# Patient Record
Sex: Male | Born: 1940 | Race: White | Hispanic: No | Marital: Married | Smoking: Former smoker
Health system: Southern US, Community
[De-identification: ages and names within clinical notes are randomized; demographics above are authoritative.]

## PROBLEM LIST (undated history)

## (undated) DIAGNOSIS — I10 Essential (primary) hypertension: Secondary | ICD-10-CM

## (undated) DIAGNOSIS — K219 Gastro-esophageal reflux disease without esophagitis: Secondary | ICD-10-CM

## (undated) DIAGNOSIS — M2550 Pain in unspecified joint: Secondary | ICD-10-CM

## (undated) DIAGNOSIS — M2540 Effusion, unspecified joint: Secondary | ICD-10-CM

## (undated) DIAGNOSIS — J189 Pneumonia, unspecified organism: Secondary | ICD-10-CM

## (undated) DIAGNOSIS — Z86718 Personal history of other venous thrombosis and embolism: Secondary | ICD-10-CM

## (undated) DIAGNOSIS — E039 Hypothyroidism, unspecified: Secondary | ICD-10-CM

## (undated) DIAGNOSIS — E785 Hyperlipidemia, unspecified: Secondary | ICD-10-CM

## (undated) DIAGNOSIS — I452 Bifascicular block: Secondary | ICD-10-CM

## (undated) DIAGNOSIS — M1990 Unspecified osteoarthritis, unspecified site: Secondary | ICD-10-CM

## (undated) DIAGNOSIS — I2510 Atherosclerotic heart disease of native coronary artery without angina pectoris: Secondary | ICD-10-CM

## (undated) DIAGNOSIS — Z87442 Personal history of urinary calculi: Secondary | ICD-10-CM

## (undated) HISTORY — DX: Atherosclerotic heart disease of native coronary artery without angina pectoris: I25.10

## (undated) HISTORY — DX: Essential (primary) hypertension: I10

## (undated) HISTORY — PX: HEMORROIDECTOMY: SUR656

## (undated) HISTORY — DX: Hyperlipidemia, unspecified: E78.5

## (undated) HISTORY — PX: CARDIAC CATHETERIZATION: SHX172

---

## 1979-08-13 HISTORY — PX: HERNIA REPAIR: SHX51

## 2000-06-17 ENCOUNTER — Emergency Department (HOSPITAL_COMMUNITY): Admission: EM | Admit: 2000-06-17 | Discharge: 2000-06-17 | Payer: Self-pay | Admitting: Emergency Medicine

## 2000-11-27 ENCOUNTER — Ambulatory Visit (HOSPITAL_COMMUNITY): Admission: RE | Admit: 2000-11-27 | Discharge: 2000-11-27 | Payer: Self-pay

## 2003-07-31 ENCOUNTER — Emergency Department (HOSPITAL_COMMUNITY): Admission: EM | Admit: 2003-07-31 | Discharge: 2003-07-31 | Payer: Self-pay

## 2003-09-15 ENCOUNTER — Ambulatory Visit (HOSPITAL_COMMUNITY): Admission: RE | Admit: 2003-09-15 | Discharge: 2003-09-16 | Payer: Self-pay | Admitting: Cardiovascular Disease

## 2003-09-15 ENCOUNTER — Encounter: Payer: Self-pay | Admitting: Cardiovascular Disease

## 2004-01-14 ENCOUNTER — Ambulatory Visit (HOSPITAL_COMMUNITY): Admission: RE | Admit: 2004-01-14 | Discharge: 2004-01-14 | Payer: Self-pay | Admitting: Cardiovascular Disease

## 2006-12-12 DIAGNOSIS — Z86718 Personal history of other venous thrombosis and embolism: Secondary | ICD-10-CM

## 2006-12-12 HISTORY — DX: Personal history of other venous thrombosis and embolism: Z86.718

## 2007-05-16 ENCOUNTER — Inpatient Hospital Stay (HOSPITAL_COMMUNITY): Admission: EM | Admit: 2007-05-16 | Discharge: 2007-05-21 | Payer: Self-pay | Admitting: Emergency Medicine

## 2007-05-21 HISTORY — PX: CORONARY ANGIOPLASTY: SHX604

## 2010-11-08 ENCOUNTER — Ambulatory Visit: Payer: Self-pay | Admitting: Cardiovascular Disease

## 2011-04-26 ENCOUNTER — Telehealth: Payer: Self-pay | Admitting: *Deleted

## 2011-04-26 NOTE — H&P (Signed)
NAMEBRAYTEN, KOMAR NO.:  [id number]   MEDICAL RECORD NO.:  [id number]          PATIENT TYPE:  INP   LOCATION:  2039                         FACILITY:  [facility]   PHYSICIAN:  Vesta Mixer, M.D. DATE OF BIRTH:  02/26/1941   DATE OF ADMISSION:  05/16/2007  DATE OF DISCHARGE:                              HISTORY & PHYSICAL   HISTORY OF PRESENT ILLNESS:  Thomas [name] is a 70 year old gentleman  with a history of coronary artery disease, cigarette smoking and  hyperlipidemia.  He is admitted to the hospital with severe substernal  chest pain and dyspnea.   The patient has a history of coronary artery disease.  He was admitted  with chest pain in 2004.  He was found to have a 99% proximal  LAD  stenosis.  He underwent PTCA and stenting of his proximal LAD using a  3.0 mm Taxus post dilated up to 3.25 mm in diameter.  He has been fairly  stable and in fact has not been back to see me for the past 3 years.   He has been followed by Dr. Jeannetta Nap and has overall been doing fairly  well.  He admits to coming off his medications; in fact, he has not been  taking aspirin in quite some time.   This morning he was out clearing brush and trees.  He had severe  substernal chest pain.  It radiated to the left arm and caused some  left arm tingling and numbness.  It was associated with diaphoresis and  dyspnea.  He called 9-1-1, and EMS gave him some sublingual  nitroglycerin which did not relieve the chest pain.  This chest pain was  finally relieved when he came to the came to the emergency room and  received IV nitroglycerin.  He now is pain free and is admitted for  evaluation.  The patient denies any recent episode similar to this.  He  remains fairly active.   CURRENT MEDICATIONS:  1. Pravachol 40 mg a day.  2. Synthroid 0.15 mg a day.  3. Maxzide once a day.   ALLERGIES:  None.   PAST MEDICAL HISTORY:  1. History of PTCA and stenting of his proximal LAD.  2.  Hypercholesterolemia.  3. Hypothyroidism.  4. Hypertension.   SOCIAL HISTORY:  The patient smokes about a half a pack of cigarettes a  day.  He does not drink alcohol.   FAMILY HISTORY:  His mother died of a heart valve surgery.  His father  lived to be an old age.   REVIEW OF SYSTEMS:  Reviewed and is essentially negative.   PHYSICAL EXAMINATION:  GENERAL:  He is a elderly gentleman in no acute  distress.  He is alert and oriented x3.  His mood and affect are normal.  He is currently pain free.  VITAL SIGNS:  His blood pressure is 108/68 with a heart rate 66.  HEENT:  Carotids with no bruits.  There is no JVD, no thyromegaly.  LUNGS:  Clear.  HEART:  Regular rate.  S1-S2.  ABDOMEN:  Good bowel sounds and is nontender.  EXTREMITIES:  No clubbing, cyanosis or edema.  NEUROLOGIC:  Nonfocal.  His pulses are intact.   ELECTROCARDIOGRAM:  Normal sinus rhythm.  He has a right bundle branch  block.   LABORATORY DATA:  His first set of cardiac enzymes is negative.  His  sodium is 138, potassium is 2.7, chlorides 104, CO2 is 15, BUN is 15,  creatinine is 1.3.  White blood cell count is 10.5, hemoglobin 16,  hematocrit 46%, platelet count is 257.   ASSESSMENT/PLAN:  Chest pain.  The patient presents with chest pain that  is very concerning for unstable angina.  He states that this feels  fairly similar to his previous episodes of angina.  He is now pain free  on nitroglycerin.  We will add heparin and Integrilin.  We will  anticipate doing a heart catheterization tomorrow.  We have discussed  the risks, benefits and options including bleeding or bruising of the  leg, myocardial infarction, stroke, the need for emergency surgery,  allergy, infection, kidney problems and death.  He understands and  agrees to proceed.  We will add aspirin and Lipitor.           ______________________________  Vesta Mixer, M.D.     PJN/MEDQ  D:  05/16/2007  T:  05/17/2007  Job:  [id number]   cc:    Windle Guard, M.D.

## 2011-04-26 NOTE — Telephone Encounter (Signed)
Received [facility] update stating pt not taking simvistatin, tried to reach pt to see what we could do, number disconnectedJodette [name]/[facility] .

## 2011-04-26 NOTE — Cardiovascular Report (Signed)
Thomas [name], HULL NO.:  [id number]   MEDICAL RECORD NO.:  [id number]          PATIENT TYPE:  INP   LOCATION:  2039                         FACILITY:  [facility]   PHYSICIAN:  Vesta Mixer, M.D. DATE OF BIRTH:  11/24/1941   DATE OF PROCEDURE:  05/21/2007  DATE OF DISCHARGE:                            CARDIAC CATHETERIZATION   Ambers Iyengar is a 70 year old gentleman with a history of coronary artery  disease.  He had a long history of hyperlipidemia and cigarette smoking.  He had an LAD stent placed several years ago.  He presented to the  hospital with episodes of chest pain last week.  Cardiac catheterization  at that time revealed a large stent in the origin of the left circumflex  artery.  He was treated with Integrilin and heparin, Plavix and aspirin  for the next 4 days, and we brought back today for relook.   PROCEDURES:  Limited left heart catheterization with coronary  angiography.   The right femoral artery was easily cannulated using modified Seldinger  technique.   LV pressure was 131/78.   Angiography, left main.  The left main has minor luminal irregularities.   The left anterior descending artery is a large vessel.  The stent is  located in the proximal aspect of the LAD and actually overlaps the  circumflex artery to a slight degree.   The stent is widely patent.  The mid and distal LAD have no significant  irregularities.   Left circumflex artery is a moderate-sized vessel.  There is a thrombus  in the origin of the left circumflex artery, but the thrombus appears to  be significantly smaller than at the previous catheterization.  He has  TIMI grade 3 flow down the circumflex artery.   CONCLUSION:  1. Significant coronary artery disease involving the left circumflex      artery.  He has a thrombus in the origin of the left circumflex      artery that appears to be successfully resolving on medical      therapy.  We will anticipate that  he goes home today on Plavix and      aspirin.  He has been instructed to come back if he has any further      episodes of chest pain.  I worry that evaluating this further with      an IVUS would quite possibly dislodge the thrombus.  We will plan      on doing a stress Cardiolite study in approximately 6 months.           ______________________________  Vesta Mixer, M.D.     PJN/MEDQ  D:  05/21/2007  T:  05/21/2007  Job:  [id number]   cc:   Windle Guard, M.D.

## 2011-04-26 NOTE — Discharge Summary (Signed)
Thomas [name], Thomas [name]              ACCOUNT NO.:  [id number]   MEDICAL RECORD NO.:  [id number]          PATIENT TYPE:  INP   LOCATION:  2039                         FACILITY:  [facility]   PHYSICIAN:  Vesta Mixer, M.D. DATE OF BIRTH:  07-Nov-1941   DATE OF ADMISSION:  05/16/2007  DATE OF DISCHARGE:  05/21/2007                               DISCHARGE SUMMARY   DISCHARGE DIAGNOSES:  1. History of coronary artery disease with a recent development of a      thrombus in the origin of the left circumflex artery.  2. History of PTCA and stenting of the proximal left anterior      descending artery.  3. Dyslipidemia.  4. Hypertension.  5. Hypothyroidism  6. Cigarette smoking.   DISCHARGE MEDICATIONS:  1. Enteric-coated aspirin 325 mg a day.  2. Plavix 75 mg a day.  3. Lipitor 80 mg a day.  4. Maxzide 37.5 mg/25 mg once a day.  5. Synthroid 0.15 mg a day.  6. Nitroglycerin 0.4 mg sublingually as needed.   DISPOSITION:  The patient is to eat a low sodium and a heart healthy  diet.  He is to see Dr. Elease Hashimoto on Friday this week.  We will make a  decision regarding his return to work at that point.   HISTORY:  Thomas [name] is a 70 year old gentleman who is status post PTCA  and stenting of his proximal left anterior descending artery  approximately 4 years ago.  He was admitted to hospital with recurrent  episodes of chest pain was found to have positive cardiac enzymes.   HOSPITAL COURSE PER PROBLEMS:  1. Coronary artery disease.  The patient underwent heart      catheterization the following morning.  He was found to have his      left anterior descending artery stent was widely patent.  The      proximal edge of the LAD stent extends out into the circumflex      artery.  This stent strut at the origin of the circumflex artery is      a nidus of a large thrombus that extended approximately 3-4 mm down      into the circumflex vessel.  The thrombus appeared to be globular      3x3 or  perhaps 4x4 mm thrombus.  There was TIMI grade 3 flow down      the circumflex artery and also there was brisk flow down the left      anterior descending artery.  The right coronary artery had only      minor luminal irregularities.  He was kept on heparin and      Integrilin for the next 4 days.  He was also started on Plavix and      aspirin.  It turns out that the patient had stopped his Plavix and      aspirin several years ago.  We brought him back for a repeat heart      catheterization on May 21, 2007, and the thrombus had greatly      diminished in size.  He still  had some evidence of a filling      defect, but the thrombus was clearly smaller and he had brisk flow      down this vessel.  He is now discharged in satisfactory condition.      Will continue him on aspirin and Plavix.  I do not think that he      will need any further IV 2b/3a inhibitors.  My thought is that if      we were to try to angioplasty the ostium of the left circumflex      artery that we would probably dislodge the stent.  Our better      option is to try to dissolve the stent where it stands.  He has      been instructed to return to the hospital if he starts having any      significant episodes of chest pain.  2. Hyperlipidemia.  The patient had come off all of his cholesterol      medicines.  We will start him on Lipitor 80 mg a day.  3. Hypertension.  The patient will be continued on his Maxzide.  4. Hypothyroidism.  The patient will continue on his Synthroid.  He      will follow up with Dr. Jeannetta Nap for this.   REVIEW OF SYSTEMS:           ______________________________  Vesta Mixer, M.D.     PJN/MEDQ  D:  05/21/2007  T:  05/22/2007  Job:  [id number]   cc:   Windle Guard, M.D.

## 2011-04-26 NOTE — Cardiovascular Report (Signed)
NAMEATA, PECHA NO.:  [id number]   MEDICAL RECORD NO.:  [id number]          PATIENT TYPE:  INP   LOCATION:  2039                         FACILITY:  [facility]   PHYSICIAN:  Vesta Mixer, M.D. DATE OF BIRTH:  12-25-1940   DATE OF PROCEDURE:  05/17/2007  DATE OF DISCHARGE:                            CARDIAC CATHETERIZATION   Thomas [name] is a 70 year old gentleman with a history of coronary artery  disease.  Status post PTCA and stenting of his proximal left anterior  descending artery 4 years ago.  He stopped taking his aspirin some time  this past year.  He presented yesterday with symptoms of chest pain that  started yesterday morning.  He had positive troponin levels overnight.  He is referred for heart catheterization for further evaluation.   PROCEDURES:  The procedure was left heart catheterization with coronary  angiography.   The right femoral artery was easily cannulated using a modified  Seldinger technique.   HEMODYNAMIC RESULTS:  LV pressure is 131/13 with an aortic pressure of  137/63.   Angiography, left main:  The left main has minor luminal irregularities.   The left anterior descending artery has mild irregularities.  The  proximal LAD stent has no evidence of restenosis.  The proximal edge of  the stent comes right up to the left circumflex artery and may in fact  overlap the origin of the circumflex artery.  There are minor luminal  irregularities in the mid and distal LAD.   Left circumflex artery has a filling defect at the origin which appears  to be a thrombus.  It appears to be right at the origin of the  circumflex were the proximal LAD stent is located.  There are no other  significant irregularities in the left circumflex artery.   Right coronary artery is large and dominant.  It has a very tortuous  takeoff.  The distal right coronary artery has minor luminal  irregularities.  The posterior descending artery is unremarkable  and the  moderate-sized posterolateral branch is normal.   Left ventriculogram was performed in the 30 RAO position.  It reveals  overall normal left ventricular systolic function.  There is some  suggestion of hypokinesis of the lateral wall.   IMPRESSION:  Coronary artery disease involving the ostium of the left  circumflex artery.  This appears to be a thrombus and may be associated  with a proximal edge of the LAD stent.  We will   PLAN:  We will treat him with heparin and Integrilin through the  weekend.  We will add Plavix and continue his aspirin.  We will restudy  him next week.  He appears to be fairly stable.   The patient did have some chest pain following the procedure, but his  EKG was unremarkable.           ______________________________  Vesta Mixer, M.D.     PJN/MEDQ  D:  05/17/2007  T:  05/17/2007  Job:  [id number]

## 2011-04-28 ENCOUNTER — Other Ambulatory Visit: Payer: Self-pay | Admitting: Cardiovascular Disease

## 2011-04-29 NOTE — Op Note (Signed)
[facility]  Patient:    Thomas [name], Thomas [name]                     MRN: [id number] Proc. Date: 11/27/00 Adm. Date:  [id number] Attending:  Meredith Leeds                           Operative Report  PREOPERATIVE DIAGNOSES:  Indirect right inguinal hernia.  POSTOPERATIVE DIAGNOSES:  Indirect right inguinal hernia.  OPERATION PERFORMED:  Repair of right inguinal hernia.  SURGEON:  Dr. Orson Slick.  ANESTHESIA:  General supplemented by local.  DESCRIPTION OF PROCEDURE:  After the patient was adequately monitored and anesthetized and after routine preparation and draping of the right inguinal region, I made a slightly oblique incision beginning just above and lateral to the pubic tubercle and dissected down through the fat until I encountered the aponeurosis of the external oblique and then identified the external ring and opened the fibers into the external ring. I mobilized the spermatic cord taking care to avoid injury to the ilioinguinal nerve. I saw no evidence of a direct hernia. I made a longitudinal cut on the proximal part of the cord and identified the large indirect sac. I dissected it free up to the internal ring and accidentally made a hole in it so I suture ligated it below the hole and amputated the excess. I then reduced the hernia into the internal ring and put a small plug of polypropylene mesh in the superior part of the internal ring held in place with a single suture of 2-0 silk. I fashioned a patch of mesh to fit the inguinal floor leaving a slit, will exit at the spermatic cord and sewed it in from the pubic tubercle superiorly and medially with a basting stitch along the internal oblique fascia and laterally and inferiorly with a running stitch along the inguinal ligament using 2-0 Prolene. I joined the tails of the mesh together lateral to the cord with a single stitch of 2-0 Prolene. I anesthetized the operative area with long  acting local anesthetic. Sponge, needle and instrument counts were correct. I closed the external oblique with running 3-0 Vicryl, closed the Scarpas fascia with running 3-0 Vicryl, closed with skin with interrupted intracuticular 4-0 Vicryl and Steri-Strips. The patient tolerated the procedure well. DD:  11/27/00 TD:  11/28/00 Job: [id number]

## 2011-04-29 NOTE — H&P (Signed)
NAME:  Thomas [name], Thomas [name]                       ACCOUNT NO.:  [id number]   MEDICAL RECORD NO.:  [id number]                   PATIENT TYPE:  OIB   LOCATION:                                       FACILITY:  [facility]   PHYSICIAN:  Vesta Mixer, M.D.              DATE OF BIRTH:  [birth year]   DATE OF ADMISSION:  09/15/2003  DATE OF DISCHARGE:                                HISTORY & PHYSICAL   REASON FOR ADMISSION:  Thomas [name] is a middle-aged gentleman with a history  of unstable angina.  Thomas [name] is admitted now for heart catheterization to further  evaluate his angina.   HISTORY OF PRESENT ILLNESS:  Thomas [name] is a 70 year old gentleman we are  asked to see for further evaluation of chest pain.  Thomas [name] has been having these  episodes of chest pain for the past several months.  Over the past several  weeks, they have been getting progressively worse.  Thomas [name] states that these  episodes of chest pain occur every time Thomas [name] walks.  They are described as a  chest pressure-like sensation with radiation up into his neck and to his  left jaw and shoulder.  The pain typically lasts for several minutes and  resolves after rest.  Thomas [name] has never had any episodes of chest pain at rest.  They are not associated with eating, drinking, change in position, or taking  a deep breath.  Thomas [name] has not had any episodes of PND, orthopnea, syncope, or  presyncope.  They are also not associated with any diaphoresis.   CURRENT MEDICATIONS:  1. Levothroid once a day.  2. Aspirin 81 mg a day.   ALLERGIES:  No known drug allergies.   PAST MEDICAL HISTORY:  1. Hypertension.  2. Hypothyroidism.  3. Hypercholesterolemia.   SOCIAL HISTORY:  The patient works for [employer].  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  Thomas [name] smoked one-  half pack of cigarettes a day for 50 years but quit approximately 10 months  ago.  Thomas [name] does not get any regular exercise.   FAMILY HISTORY:  His father is  still alive and is 42 years old.  Thomas [name] has an  irregular heart beat and also has hypertension.  His mother died at age 57  during heart valve surgery.  She also had a history of coronary artery  disease.   REVIEW OF SYSTEMS:  Was reviewed and was essentially negative.  Thomas [name] denies  any weight gain, weight loss, heat or cold intolerance, or problems with his  eyes, ears, nose, or throat.  Thomas [name] denies any sputum production.  Thomas [name] denies  any GI or GU problems.  Thomas [name] denies any rash or skin nodules.  His Review of  Systems was reviewed and is otherwise negative.   PHYSICAL EXAMINATION:  GENERAL:  Thomas [name] is a middle-aged gentleman in no acute  distress.  Thomas [name] is alert and oriented x 3, and his mood and affect are normal.  VITAL SIGNS:  Weight 212 pounds.  Blood pressure 130.86 with a heart rate of  60.  NECK:  Exam reveals 2+ carotids.  Thomas [name] has no bruits.  There is no JVD, no  thyromegaly.  LUNGS:  Clear to auscultation.  HEART:  Regular rate, S1, S2 with no murmurs, gallops, or rubs.  ABDOMEN:  Good bowel sounds and nontender.  EXTREMITIES:  No clubbing, cyanosis, or edema.  NEUROLOGIC:  Exam is nonfocal.   LABORATORY DATA:  His EKG reveals normal sinus rhythm.  Thomas [name] has a right  bundle branch block with associated ST and T wave abnormalities.   ASSESSMENT:  Thomas [name] presents with episodes of classic unstable angina.  Thomas [name] has chest pain every time Thomas [name] exercises, and the chest pains resolve with  rest.  Thomas [name] has a right bundle branch block but otherwise does not have any  acute changes on his EKG.   His recent cholesterol numbers revealed an LDL of 150 with an HDL of 30.  Thomas [name]  also has a history of cigarette smoking until approximately 10 months ago.  Given these symptoms and this history, I have recommended that we proceed  with heart catheterization.  We have discussed the risks, benefits, and  options of heart catheterization.  Thomas [name] understands and agrees to proceed.  We  will plan on performing  catheterization early next week.                                                Vesta Mixer, M.D.    PJN/MEDQ  D:  09/09/2003  T:  09/09/2003  Job:  [id number]   cc:   Windle Guard, M.D.  [address]  [place], [place] [address]  Fax: [phone]

## 2011-04-29 NOTE — Discharge Summary (Signed)
   Thomas [name], Thomas [name]                        ACCOUNT NO.:  [id number]   MEDICAL RECORD NO.:  [id number]                   PATIENT TYPE:  OIB   LOCATION:  6523                                 FACILITY:  [facility]   PHYSICIAN:  Vesta Mixer, M.D.              DATE OF BIRTH:  04-16-1941   DATE OF ADMISSION:  09/15/2003  DATE OF DISCHARGE:  09/16/2003                                 DISCHARGE SUMMARY   DISCHARGE DIAGNOSES:  1. Coronary artery disease, status post percutaneous transluminal coronary     angioplasty and stenting of his proximal left anterior descending.  2. Dyslipidemia.  3. Hypertension.   DISCHARGE MEDICATIONS:  1. Enteric coated aspirin 325 mg p.o. daily.  2. Plavix 75 mg p.o. daily.  3. Levoxyl 0.1 mg daily.  4. Maxzide once daily.  5. Nitroglycerin 0.4 mg sublingual as needed.   HISTORY OF PRESENT ILLNESS:  The patient is a 70 year old gentleman who  recently presented to the office with symptoms of unstable angina.  He was  admitted for heart catheterization for further evaluation.   HOSPITAL COURSE:  PROBLEM #1:  CORONARY ARTERY DISEASE:  The patient had a  diagnostic heart catheterization which revealed a tight 99% stenosis in the  proximal aspect of his left anterior descending.  The stenosis extended all  the way to the left main.  He underwent successful percutaneous transluminal  coronary angioplasty and stenting of this left anterior descending at the  ostium and proximal vessel.  He tolerated the procedure quite well and did  not have any significant electrocardiogram changes.  He will be discharged  on the above-noted medications and disposition.  He will follow up with Dr.  Elease Hashimoto in one week.  He will follow up with Dr.  Jeannetta Nap as needed.   All of the patient's other medical problems were stable.                                                Vesta Mixer, M.D.    PJN/MEDQ  D:  09/16/2003  T:  09/16/2003  Job:  [id number]   cc:   Windle Guard, M.D.  [address]  [place], [place] [address]  Fax: [phone]

## 2011-04-29 NOTE — Cardiovascular Report (Signed)
NAME:  Thomas [name], Thomas [name]                        ACCOUNT NO.:  [id number]   MEDICAL RECORD NO.:  [id number]                   PATIENT TYPE:  OIB   LOCATION:  6523                                 FACILITY:  [facility]   PHYSICIAN:  Vesta Mixer, M.D.              DATE OF BIRTH:  23-Jun-1941   DATE OF PROCEDURE:  09/15/2003  DATE OF DISCHARGE:                              CARDIAC CATHETERIZATION   INDICATIONS:  Mr. Hegner is a 70 year old gentleman with a history of  hypertension, hypercholesterolemia, and hypothyroidism.  He presented to the  office the other day with new onset unstable angina.  He described chest  pain with any sort of exertion.  The chest pain was quite reliable.  We  proceeded with heart catheterization based on his clinical presentation.   PROCEDURE:  Left heart catheterization with coronary angiography with PTCA  and stenting of the proximal left anterior descending artery.   The right femoral artery was easily cannulated using a modified Seldinger  technique.   HEMODYNAMICS:  The LV pressure was 141/16 with an aortic pressure of 141/71.   ANGIOGRAPHY:  The left main coronary artery had mild disease in the proximal  segment.  The distal segment had a 20-30% stenosis.   The left anterior descending artery had diffuse 30-40% stenosis along the  proximal 8 mm and then had a tight eccentric plaque rupture with about a 99%  stenosis.  Following this there was some post stenotic dilatation.  There  were minor luminal irregularities in the mid and distal LAD.   The left circumflex artery is a moderate to large vessel.  There is mild  disease in the circumflex artery.   The right coronary artery is relatively small, but is dominant.  It gives  off a small, but normal posterolateral artery and posterior descending  artery.   LEFT VENTRICULOGRAM:  The left ventriculogram was performed in a 30 RAO  position.  It reveals overall normal left ventricular systolic  function.  There was no mitral regurgitation.   PTCA AND STENTING:  The 6-French system was traded out for a 7-French  system.  The patient was given a double bolus Integrilin drip followed by a  weight based heparin protocol.   The left main was engaged using a Judkins left 4 guide.  A Patriot guidewire  was placed down into the distal left anterior descending artery.  A 2.5 x 15  mm Maverick balloon was positioned across the stenosis and was inflated up  to 8 atmospheres for 31 seconds.  Following this a 3.0 x 16 mm Taxus stent  was positioned right at the ostium of the left anterior descending artery.  It was inflated up to 14 atmospheres for 19 seconds.  Angiography during the  balloon inflation revealed that there was no flow down the left circumflex  artery or the LAD.  They were occluded for approximately 10 seconds.  The  patient was hemodynamically stable during this inflation.  The balloon was  deflated.  Follow-up angiography revealed a very nice angiographic result.  Post stent dilatation was achieved using a 3.25 x 12 mm Quantum Maverick.  It was inflated up to 18 atmospheres for 31 seconds followed by 12  atmospheres for 7 seconds in the more proximal aspect of the stent.  Finally, it was inflated in the middle of the stent up to 20 atmospheres for  15 seconds.  This resulted in a very nice angiographic result.  He has no  significant residual stenosis in the proximal aspect of the vessel.  There  is some irregularity at the level of the post stent dilatation.  The final  balloon was inflated up to 20 atmospheres and achieved maximal size at that  time.  He does not appear to have any other significant problems with this  lesion.   COMPLICATIONS:  None.   CONCLUSION:  Successful PTCA and stenting of the left anterior descending  artery.  He had no complications despite the proximal location of this  lesion and the brief occlusion of both the left circumflex artery and  the  left anterior descending artery during balloon inflation.                                               Vesta Mixer, M.D.    PJN/MEDQ  D:  09/15/2003  T:  09/15/2003  Job:  [id number]   cc:   Windle Guard, M.D.  [address]  [place], [place] [address]  Fax: [phone]

## 2011-05-13 ENCOUNTER — Encounter: Payer: Self-pay | Admitting: Cardiovascular Disease

## 2011-05-13 ENCOUNTER — Other Ambulatory Visit: Payer: Self-pay | Admitting: *Deleted

## 2011-05-13 ENCOUNTER — Ambulatory Visit (INDEPENDENT_AMBULATORY_CARE_PROVIDER_SITE_OTHER): Payer: Medicare Other | Admitting: Cardiovascular Disease

## 2011-05-13 VITALS — BP 134/74 | HR 64 | Wt 204.0 lb

## 2011-05-13 DIAGNOSIS — E876 Hypokalemia: Secondary | ICD-10-CM

## 2011-05-13 DIAGNOSIS — I10 Essential (primary) hypertension: Secondary | ICD-10-CM

## 2011-05-13 DIAGNOSIS — E785 Hyperlipidemia, unspecified: Secondary | ICD-10-CM | POA: Insufficient documentation

## 2011-05-13 DIAGNOSIS — I2510 Atherosclerotic heart disease of native coronary artery without angina pectoris: Secondary | ICD-10-CM

## 2011-05-13 LAB — LIPID PANEL
HDL: 37.1 mg/dL — ABNORMAL LOW (ref >39.00)
LDL Cholesterol: 76 mg/dL (ref 0–99)
Total CHOL/HDL Ratio: 4

## 2011-05-13 LAB — BASIC METABOLIC PANEL
CO2: 28 mEq/L (ref 19–32)
Calcium: 9.1 mg/dL (ref 8.4–10.5)
Chloride: 101 mEq/L (ref 96–112)
Creatinine, Ser: 1.4 mg/dL (ref 0.4–1.5)
Glucose, Bld: 104 mg/dL — ABNORMAL HIGH (ref 70–99)

## 2011-05-13 LAB — HEPATIC FUNCTION PANEL
AST: 19 U/L (ref 0–37)
Alkaline Phosphatase: 53 U/L (ref 39–117)
Total Bilirubin: 0.8 mg/dL (ref 0.3–1.2)

## 2011-05-13 MED ORDER — POTASSIUM CHLORIDE ER 10 MEQ PO TBCR: 10.0000 meq | EXTENDED_RELEASE_TABLET | Freq: Every day | ORAL | Status: DC

## 2011-05-13 NOTE — Assessment & Plan Note (Signed)
Thomas [name] remains stable. He has not had any further episodes of chest pain.

## 2011-05-13 NOTE — Progress Notes (Signed)
Thomas [name] Date of Birth  1941-02-12 [facility] [address]     [address] [place], [place]  [address] [phone]  Fax  [phone]  History of Present Illness:  Thomas [name]  is a middle-age gentleman with a history of coronary artery disease. He is status post PTCA and stenting of his ostial left anterior descending artery. He's had thrombus formation on the proximal edge of the stent that extended out over the left complex artery. Since identification of thrombus, he has been on Plavix and should remain on Plavix for the rest of his life.  Current Outpatient Prescriptions on File Prior to Visit  Medication Sig Dispense Refill  . simvastatin (ZOCOR) 40 MG tablet TAKE ONE TABLET BY MOUTH EVERY DAY  30 tablet  1    No Known Allergies  Past Medical History  Diagnosis Date  . Hypertension   . Hyperlipidemia   . Arrhythmia   . Coronary artery disease   . Thyroid disease     Past Surgical History  Procedure Date  . Cardiac catheterization   . Coronary angioplasty 05/21/2007    lad stent/taxus stent    History  Smoking status  . Current Everyday Smoker -- 0.3 packs/day  . Types: Cigarettes  Smokeless tobacco  . Not on file    History  Alcohol Use No    History reviewed. No pertinent family history.  Reviw of Systems:  Reviewed in the HPI.  All other systems are negative.  Physical Exam: BP 134/74  Pulse 64  Wt 204 lb (92.534 kg) The patient is alert and oriented x 3.  The mood and affect are normal.  The skin is warm and dry.  Color is normal.  The HEENT exam reveals that the sclera are nonicteric.  The mucous membranes are moist.  The carotids are 2+ without bruits.  There is no thyromegaly.  There is no JVD.  The lungs are clear.  The chest wall is non tender.  The heart exam reveals a regular rate with a normal S1 and S2.  There are no murmurs, gallops, or rubs.  The PMI is not displaced.   Abdominal exam reveals good  bowel sounds.  There is no guarding or rebound.  There is no hepatosplenomegaly or tenderness.  There are no masses.  Exam of the legs reveal no clubbing, cyanosis, or edema.  The legs are without rashes.  The distal pulses are intact.  Cranial nerves II - XII are intact.  Motor and sensory functions are intact.  The gait is normal.  Assessment / Plan:

## 2011-05-13 NOTE — Assessment & Plan Note (Signed)
>>  ASSESSMENT AND PLAN FOR ESSENTIAL HYPERTENSION WRITTEN ON 05/13/2011  1:30 PM BY NAHSER, PHILIP J, MD  His blood pressure remains well controlled. We'll continue with his same medications.

## 2011-05-13 NOTE — Assessment & Plan Note (Signed)
His blood pressure remains well controlled. We'll continue with his same medications.

## 2011-05-13 NOTE — Telephone Encounter (Signed)
Script called, Pt verbalized understanding. Alfonso Ramus RN

## 2011-06-10 ENCOUNTER — Other Ambulatory Visit (INDEPENDENT_AMBULATORY_CARE_PROVIDER_SITE_OTHER): Payer: Medicare Other | Admitting: *Deleted

## 2011-06-10 DIAGNOSIS — E785 Hyperlipidemia, unspecified: Secondary | ICD-10-CM

## 2011-06-10 DIAGNOSIS — I2510 Atherosclerotic heart disease of native coronary artery without angina pectoris: Secondary | ICD-10-CM

## 2011-06-10 LAB — BASIC METABOLIC PANEL
CO2: 30 mEq/L (ref 19–32)
Calcium: 9.3 mg/dL (ref 8.4–10.5)
Chloride: 101 mEq/L (ref 96–112)
Glucose, Bld: 112 mg/dL — ABNORMAL HIGH (ref 70–99)
Sodium: 139 mEq/L (ref 135–145)

## 2011-06-13 ENCOUNTER — Telehealth: Payer: Self-pay | Admitting: *Deleted

## 2011-06-13 DIAGNOSIS — E876 Hypokalemia: Secondary | ICD-10-CM

## 2011-06-13 NOTE — Telephone Encounter (Signed)
Increased k+ daily to bid and recheck bmet in one month. Pt informed and lab app made.Pt verbalized understanding. Alfonso Ramus RN

## 2011-06-20 ENCOUNTER — Telehealth: Payer: Self-pay | Admitting: *Deleted

## 2011-06-20 NOTE — Telephone Encounter (Signed)
Pt request not to have to report for jury duty and would like a letter to excuse him. Pt states his heart flutters and dont want to sit there. Explained Dr Ian Bushman only excuses for new surgery cases. Said his hips and back hurt bad and its hard for him to walk. I suggested he check with his orthopaedic sugeon for a letter. Informed him i would pose question to dr Ian Bushman next week due to his vacation and get back with him next week. Pt ok with that. Alfonso Ramus RN

## 2011-06-21 NOTE — Telephone Encounter (Signed)
Agree.  I do not think I will be able to excuse him for cardiac reasons.

## 2011-06-24 NOTE — Telephone Encounter (Signed)
Pt informed and papers placed at front desk.

## 2011-07-14 ENCOUNTER — Other Ambulatory Visit (INDEPENDENT_AMBULATORY_CARE_PROVIDER_SITE_OTHER): Payer: Medicare Other | Admitting: *Deleted

## 2011-07-14 DIAGNOSIS — E876 Hypokalemia: Secondary | ICD-10-CM

## 2011-07-14 LAB — BASIC METABOLIC PANEL
BUN: 17 mg/dL (ref 6–23)
CO2: 26 mEq/L (ref 19–32)
Calcium: 9.5 mg/dL (ref 8.4–10.5)
Creatinine, Ser: 1.3 mg/dL (ref 0.4–1.5)
Glucose, Bld: 106 mg/dL — ABNORMAL HIGH (ref 70–99)

## 2011-07-15 ENCOUNTER — Telehealth: Payer: Self-pay | Admitting: *Deleted

## 2011-07-15 MED ORDER — POTASSIUM CHLORIDE ER 10 MEQ PO TBCR: 10.0000 meq | EXTENDED_RELEASE_TABLET | Freq: Two times a day (BID) | ORAL | Status: DC

## 2011-07-15 NOTE — Telephone Encounter (Signed)
Patient called with lab results. Patient request refill. Alfonso Ramus RN

## 2011-08-11 ENCOUNTER — Other Ambulatory Visit: Payer: Self-pay | Admitting: Cardiovascular Disease

## 2011-09-29 LAB — CK TOTAL AND CKMB (NOT AT ARMC)
CK, MB: 8.3 — ABNORMAL HIGH
Total CK: 147
Total CK: 188

## 2011-09-29 LAB — CBC
Hemoglobin: 14.1
Hemoglobin: 15.1
MCHC: 33.5
MCHC: 33.9
MCHC: 34.4
MCHC: 34.4
MCV: 94
Platelets: 217
Platelets: 222
Platelets: 229
RBC: 4.36
RBC: 4.66
RBC: 4.79
RDW: 13
RDW: 13.1
RDW: 13.1
RDW: 13.5
RDW: 13.6
WBC: 8.5

## 2011-09-29 LAB — PROTIME-INR
INR: 1
INR: 1
Prothrombin Time: 13.3
Prothrombin Time: 13.8

## 2011-09-29 LAB — HEPARIN LEVEL (UNFRACTIONATED)
Heparin Unfractionated: 0.23 — ABNORMAL LOW
Heparin Unfractionated: 0.43
Heparin Unfractionated: 0.58
Heparin Unfractionated: 0.59

## 2011-09-29 LAB — BASIC METABOLIC PANEL
BUN: 13
BUN: 14
BUN: 9
CO2: 27
CO2: 32
Calcium: 9.5
Chloride: 103
Chloride: 105
Creatinine, Ser: 1.03
Creatinine, Ser: 1.27
GFR calc Af Amer: >60
GFR calc non Af Amer: 57 — ABNORMAL LOW
GFR calc non Af Amer: >60
Glucose, Bld: 104 — ABNORMAL HIGH
Glucose, Bld: 130 — ABNORMAL HIGH
Potassium: 2.8 — ABNORMAL LOW
Potassium: 3.7
Sodium: 139
Sodium: 141

## 2011-09-29 LAB — POCT CARDIAC MARKERS
CKMB, poc: <1 — ABNORMAL LOW
Myoglobin, poc: 158

## 2011-09-29 LAB — DIFFERENTIAL
Basophils Absolute: 0
Basophils Relative: 0
Eosinophils Absolute: 0
Neutro Abs: 8.5 — ABNORMAL HIGH
Neutrophils Relative %: 81 — ABNORMAL HIGH

## 2011-09-29 LAB — I-STAT 8, (EC8 V) (CONVERTED LAB)
Acid-Base Excess: 1
Bicarbonate: 24.3 — ABNORMAL HIGH
HCT: 47
Operator id: 234501
pCO2, Ven: 35.1 — ABNORMAL LOW

## 2011-09-29 LAB — TROPONIN I
Troponin I: 1.13
Troponin I: 1.27

## 2011-09-29 LAB — LIPID PANEL
HDL: 27 — ABNORMAL LOW
VLDL: 33

## 2011-09-29 LAB — TSH: TSH: 1.801

## 2011-11-10 ENCOUNTER — Encounter: Payer: Self-pay | Admitting: Cardiovascular Disease

## 2011-11-10 NOTE — Telephone Encounter (Deleted)
error 

## 2011-11-17 ENCOUNTER — Ambulatory Visit: Payer: Medicare Other | Admitting: Cardiovascular Disease

## 2011-12-21 ENCOUNTER — Ambulatory Visit (INDEPENDENT_AMBULATORY_CARE_PROVIDER_SITE_OTHER): Payer: Medicare Other | Admitting: Cardiovascular Disease

## 2011-12-21 ENCOUNTER — Encounter: Payer: Self-pay | Admitting: Cardiovascular Disease

## 2011-12-21 DIAGNOSIS — I10 Essential (primary) hypertension: Secondary | ICD-10-CM

## 2011-12-21 DIAGNOSIS — I2510 Atherosclerotic heart disease of native coronary artery without angina pectoris: Secondary | ICD-10-CM

## 2011-12-21 DIAGNOSIS — E785 Hyperlipidemia, unspecified: Secondary | ICD-10-CM

## 2011-12-21 LAB — LIPID PANEL
Cholesterol: 177 mg/dL (ref 0–200)
HDL: 39.4 mg/dL (ref >39.00)
Total CHOL/HDL Ratio: 4
Triglycerides: 142 mg/dL (ref 0.0–149.0)

## 2011-12-21 LAB — HEPATIC FUNCTION PANEL
ALT: 20 U/L (ref 0–53)
AST: 17 U/L (ref 0–37)
Albumin: 4.3 g/dL (ref 3.5–5.2)
Alkaline Phosphatase: 55 U/L (ref 39–117)
Total Protein: 7.1 g/dL (ref 6.0–8.3)

## 2011-12-21 LAB — BASIC METABOLIC PANEL
CO2: 27 mEq/L (ref 19–32)
Calcium: 9.3 mg/dL (ref 8.4–10.5)
Creatinine, Ser: 1.3 mg/dL (ref 0.4–1.5)
Sodium: 139 mEq/L (ref 135–145)

## 2011-12-21 NOTE — Assessment & Plan Note (Signed)
>>  ASSESSMENT AND PLAN FOR ESSENTIAL HYPERTENSION WRITTEN ON 12/21/2011 10:39 AM BY NAHSER, PHILIP J, MD  His blood pressure remained stable.

## 2011-12-21 NOTE — Assessment & Plan Note (Signed)
His blood pressure remained stable.

## 2011-12-21 NOTE — Assessment & Plan Note (Signed)
Cliff remained stable. He is a stent in his left anterior descending artery. He's to continue with Plavix lifelong. We'll continue with his aspirin. I've advised him to stop smoking which would reduce the risk of recurrent coronary artery disease. I'll see him again in 6 months for an office visit and fasting labs.

## 2011-12-21 NOTE — Progress Notes (Signed)
    Thomas [name] Date of Birth  1941-03-16 [facility]     [place] [facility]  [address]    [address]   [address] [place], [place]  [address]    [place], [place]  [address] [phone]  Fax  [phone]  [phone]  Fax [phone]   History of Present Illness:  Thomas [name] is a 71 year old gentleman with a history of coronary artery disease. He is status post PTCA and stenting of his left anterior descending artery. He's had thrombus formation on the proximal edge of the stent which extended out over the left circumflex artery. He's been on Plavix since that time.  He complains of some hip pain.  He still smokes a few cigarettes a day.  Current Outpatient Prescriptions on File Prior to Visit  Medication Sig Dispense Refill  . aspirin 325 MG tablet Take 325 mg by mouth daily.        [name] levothyroxine (LEVOTHROID) 150 MCG tablet Take 150 mcg by mouth daily.        . nitroGLYCERIN (NITROSTAT) 0.4 MG SL tablet Place 0.4 mg under the tongue every 5 (five) minutes as needed.        [name] PLAVIX 75 MG tablet TAKE ONE TABLET BY MOUTH EVERY DAY  30 each  9  . potassium chloride (K-DUR) 10 MEQ tablet Take 1 tablet (10 mEq total) by mouth 2 (two) times daily.  60 tablet  5  . simvastatin (ZOCOR) 40 MG tablet TAKE ONE TABLET BY MOUTH EVERY DAY  30 tablet  9  . triamterene-hydrochlorothiazide (MAXZIDE-25) 37.5-25 MG per tablet Take 1 tablet by mouth daily.          No Known Allergies  Past Medical History  Diagnosis Date  . Hypertension   . Hyperlipidemia   . Arrhythmia   . Coronary artery disease   . Thyroid disease     Past Surgical History  Procedure Date  . Cardiac catheterization   . Coronary angioplasty 05/21/2007    lad stent/taxus stent    History  Smoking status  . Current Everyday Smoker -- 0.3 packs/day  . Types: Cigarettes  Smokeless tobacco  . Not on file    History  Alcohol Use No    No family history on file.  Reviw of Systems:  Reviewed in  the HPI.  All other systems are negative.  Physical Exam: BP 148/85  Pulse 64  Ht 5\' 11"  (1.803 m)  Wt 206 lb 12.8 oz (93.804 kg)  BMI 28.84 kg/m2 The patient is alert and oriented x 3.  The mood and affect are normal.   Skin: warm and dry.  Color is normal.    HEENT:   Normocephalic/atraumatic. The carotids are normal. His neck is supple. He does members moist.  Lungs: Lungs are clear.   Heart: Regular S1-S2. No murmurs gallops or rubs.    Abdomen: Good bowel sounds. There is no hepatomegaly. His abdomen is nontender.  Extremities:  No clubbing cyanosis or edema.  Neuro:  Nonfocal. Gait is normal    ECG: Normal sinus rhythm. He has a right bundle branch block.  Assessment / Plan:

## 2011-12-21 NOTE — Patient Instructions (Addendum)
Ask you pharmacist about the cost of Atorvastatin 20 mg a day instead of Simvastatin 40 mg a day.   Your physician wants you to follow-up in: 6 MONTH You will receive a reminder letter in the mail two months in advance. If you don't receive a letter, please call our office to schedule the follow-up appointment.  Your physician recommends that you return for a FASTING lipid profile: TODAY AND IN 6 MONTHS

## 2011-12-22 ENCOUNTER — Encounter: Payer: Self-pay | Admitting: *Deleted

## 2012-06-20 ENCOUNTER — Other Ambulatory Visit: Payer: Self-pay | Admitting: Cardiovascular Disease

## 2012-08-23 ENCOUNTER — Other Ambulatory Visit: Payer: Self-pay | Admitting: Cardiovascular Disease

## 2012-08-23 NOTE — Telephone Encounter (Signed)
Fax Received. Refill Completed. Shana Younge Chowoe (R.M.A)   

## 2012-08-30 ENCOUNTER — Other Ambulatory Visit: Payer: Self-pay | Admitting: Cardiovascular Disease

## 2012-08-30 NOTE — Telephone Encounter (Signed)
Refilled plavix 

## 2013-08-26 ENCOUNTER — Telehealth: Payer: Self-pay | Admitting: Cardiovascular Disease

## 2013-08-26 NOTE — Telephone Encounter (Signed)
Surgical Clearance faxed to [facility] [phone] on 9.12.14 @ 3:32 PM/djc

## 2013-09-02 ENCOUNTER — Encounter: Payer: Self-pay | Admitting: Cardiovascular Disease

## 2013-09-11 ENCOUNTER — Encounter: Payer: Self-pay | Admitting: Nurse Practitioner

## 2013-09-11 ENCOUNTER — Ambulatory Visit (INDEPENDENT_AMBULATORY_CARE_PROVIDER_SITE_OTHER): Payer: Medicare Other | Admitting: Nurse Practitioner

## 2013-09-11 VITALS — BP 142/88 | HR 77 | Ht 72.0 in | Wt 197.8 lb

## 2013-09-11 DIAGNOSIS — I2510 Atherosclerotic heart disease of native coronary artery without angina pectoris: Secondary | ICD-10-CM

## 2013-09-11 NOTE — Patient Instructions (Addendum)
We are going to arrange for a stress test (Lexiscan)  Stay on your current medicines  I am sending a note to Dr. Lequita Halt  See Dr. Elease Hashimoto in 6 months  Call the [facility] office at [phone] if you have any questions, problems or concerns.

## 2013-09-11 NOTE — Progress Notes (Signed)
[name] Date of Birth: 1941-08-24 Medical Record #[id number]  History of Present Illness: Mr. Winfree is seen back today for a pre op visit. Seen for Dr. Elease Hashimoto. He has known CAD with past PTCA and stenting of the LAD - has had thrombus formation on the proximal edge of the stent which extended out over the LCX - on chronic Plavix. Other issues include HLD, hypothyroidism, and HTN. Has had ongoing tobacco abuse. Has not had follow up stress testing since his last cath in 2008.   Has not been seen here since January of 2013 - was doing ok.   Here for surgical clearance - note was sent already by Dr. Elease Hashimoto - felt to be at moderate to high risk and Plavix is to NOT be stopped due to his prior history of thrombosis.   Comes in today. Here alone. Pretty limited by hip pain. Not able to walk very far. No chest pain. Not short of breath. No syncope. Still smoking - about 1/2 pack per day. He is planning on hip replacement in November. Has been cleared by Dr. Jeannetta Nap. Labs are checked by Dr. Denman George.   Current Outpatient Prescriptions  Medication Sig Dispense Refill  . aspirin 325 MG tablet Take 325 mg by mouth daily.        . clopidogrel (PLAVIX) 75 MG tablet TAKE ONE TABLET BY MOUTH EVERY DAY  30 tablet  9  . levothyroxine (LEVOTHROID) 150 MCG tablet Take 150 mcg by mouth daily.        . nitroGLYCERIN (NITROSTAT) 0.4 MG SL tablet Place 0.4 mg under the tongue every 5 (five) minutes as needed.        . potassium chloride (KLOR-CON) 20 MEQ packet Take 20 mEq by mouth daily.      . simvastatin (ZOCOR) 40 MG tablet TAKE ONE TABLET BY MOUTH EVERY DAY  30 tablet  5  . triamterene-hydrochlorothiazide (MAXZIDE) 75-50 MG per tablet Take 1 tablet by mouth daily.       No current facility-administered medications for this visit.    No Known Allergies  Past Medical History  Diagnosis Date  . Hypertension   . Hyperlipidemia   . Arrhythmia   . Coronary artery disease     prior PTCA and  stenting of the LAD with thrombus formation on the proximal edge of the stent which extended out over the LCX - on chronic Plavix.   . Thyroid disease     Past Surgical History  Procedure Laterality Date  . Cardiac catheterization    . Coronary angioplasty  05/21/2007    lad stent/taxus stent    History  Smoking status  . Current Every Day Smoker -- 0.30 packs/day  . Types: Cigarettes  Smokeless tobacco  . Not on file    History  Alcohol Use No    History reviewed. No pertinent family history.  Review of Systems: The review of systems is positive for hip pain.  All other systems were reviewed and are negative.  Physical Exam: BP 142/88  Pulse 77  Ht 6' (1.829 m)  Wt 197 lb 12.8 oz (89.721 kg)  BMI 26.82 kg/m2 Patient is very pleasant and in no acute distress. Skin is warm and dry. Color is normal.  HEENT is unremarkable. Normocephalic/atraumatic. PERRL. Sclera are nonicteric. Neck is supple. No masses. No JVD. Lungs are coarse.  Cardiac exam shows a regular rate and rhythm. Abdomen is soft. Extremities are without edema. Gait and ROM are intact.  No gross neurologic deficits noted.  LABORATORY DATA: EKG today shows sinus with RBBB.     Chemistry      Component Value Date/Time   NA 139 12/21/2011 1103   K 3.4* 12/21/2011 1103   CL 103 12/21/2011 1103   CO2 27 12/21/2011 1103   BUN 19 12/21/2011 1103   CREATININE 1.3 12/21/2011 1103      Component Value Date/Time   CALCIUM 9.3 12/21/2011 1103   ALKPHOS 55 12/21/2011 1103   AST 17 12/21/2011 1103   ALT 20 12/21/2011 1103   BILITOT 0.9 12/21/2011 1103     Lab Results  Component Value Date   WBC 8.5 05/21/2007   HGB 15.4 05/21/2007   HCT 45.6 05/21/2007   MCV 95.1 05/21/2007   PLT 229 05/21/2007   Lab Results  Component Value Date   CHOL 177 12/21/2011   HDL 39.40 12/21/2011   LDLCALC 109* 12/21/2011   TRIG 142.0 12/21/2011   CHOLHDL 4 12/21/2011    CARDIAC CATHETERIZATION   DATE OF PROCEDURE: 05/17/2007   Treyveon Mochizuki is a 72 year old  gentleman with a history of coronary artery  disease. Status post PTCA and stenting of his proximal left anterior  descending artery 4 years ago. He stopped taking his aspirin some time  this past year. He presented yesterday with symptoms of chest pain that  started yesterday morning. He had positive troponin levels overnight.  He is referred for heart catheterization for further evaluation.  PROCEDURES: The procedure was left heart catheterization with coronary  angiography.  The right femoral artery was easily cannulated using a modified  Seldinger technique.  HEMODYNAMIC RESULTS: LV pressure is 131/13 with an aortic pressure of  137/63.  Angiography, left main: The left main has minor luminal irregularities.  The left anterior descending artery has mild irregularities. The  proximal LAD stent has no evidence of restenosis. The proximal edge of  the stent comes right up to the left circumflex artery and may in fact  overlap the origin of the circumflex artery. There are minor luminal  irregularities in the mid and distal LAD.  Left circumflex artery has a filling defect at the origin which appears  to be a thrombus. It appears to be right at the origin of the  circumflex were the proximal LAD stent is located. There are no other  significant irregularities in the left circumflex artery.  Right coronary artery is large and dominant. It has a very tortuous  takeoff. The distal right coronary artery has minor luminal  irregularities. The posterior descending artery is unremarkable and the  moderate-sized posterolateral branch is normal.  Left ventriculogram was performed in the 30 RAO position. It reveals  overall normal left ventricular systolic function. There is some  suggestion of hypokinesis of the lateral wall.  IMPRESSION: Coronary artery disease involving the ostium of the left  circumflex artery. This appears to be a thrombus and may be associated  with a proximal edge of the  LAD stent. We will  PLAN: We will treat him with heparin and Integrilin through the  weekend. We will add Plavix and continue his aspirin. We will restudy  him next week. He appears to be fairly stable.  The patient did have some chest pain following the procedure, but his  EKG was unremarkable.  ______________________________  Vesta Mixer, M.D.  PJN/MEDQ D: 05/17/2007 T: 05/17/2007 Job: [id number]   CARDIAC CATHETERIZATION DATE OF PROCEDURE: 05/21/2007   Olden Klauer is a 72 year old gentleman with  a history of coronary artery  disease. He had a long history of hyperlipidemia and cigarette smoking.  He had an LAD stent placed several years ago. He presented to the  hospital with episodes of chest pain last week. Cardiac catheterization  at that time revealed a large stent in the origin of the left circumflex  artery. He was treated with Integrilin and heparin, Plavix and aspirin  for the next 4 days, and we brought back today for relook.  PROCEDURES: Limited left heart catheterization with coronary  angiography.  The right femoral artery was easily cannulated using modified Seldinger  technique.  LV pressure was 131/78.  Angiography, left main. The left main has minor luminal irregularities.  The left anterior descending artery is a large vessel. The stent is  located in the proximal aspect of the LAD and actually overlaps the  circumflex artery to a slight degree.  The stent is widely patent. The mid and distal LAD have no significant  irregularities.  Left circumflex artery is a moderate-sized vessel. There is a thrombus  in the origin of the left circumflex artery, but the thrombus appears to  be significantly smaller than at the previous catheterization. He has  TIMI grade 3 flow down the circumflex artery.   CONCLUSION:  1. Significant coronary artery disease involving the left circumflex  artery. He has a thrombus in the origin of the left circumflex  artery that  appears to be successfully resolving on medical  therapy. We will anticipate that he goes home today on Plavix and  aspirin. He has been instructed to come back if he has any further  episodes of chest pain. I worry that evaluating this further with  an IVUS would quite possibly dislodge the thrombus. We will plan  on doing a stress Cardiolite study in approximately 6 months.  ______________________________  Vesta Mixer, M.D.  PJN/MEDQ D: 05/21/2007 T: 05/21/2007 Job: [id number]    Assessment / Plan: 1. CAD - past PCI - committed to lifelong Plavix - no current symptoms but has had no follow up stress testing. Will arrange for Lexiscan.   2. HTN - BP fair.   3. HLD - labs checked by PCP.   4. OA with plans for hip replacement - already deemed to be at moderate to high risk from our standpoint. He is to NOT stop his Plavix due to prior thrombosis. Patient is aware.   Will tentatively see him back in 6 months. Will be available as well at the time of his surgery.   Patient is agreeable to this plan and will call if any problems develop in the interim.   Rosalio Macadamia, RN, ANP-C [facility] [address] [place], [place]  [address]

## 2013-09-23 ENCOUNTER — Ambulatory Visit (HOSPITAL_COMMUNITY): Payer: Medicare Other | Attending: Cardiology | Admitting: Radiology

## 2013-09-23 VITALS — BP 142/88 | Ht 72.0 in | Wt 199.0 lb

## 2013-09-23 DIAGNOSIS — I4949 Other premature depolarization: Secondary | ICD-10-CM

## 2013-09-23 DIAGNOSIS — Z01810 Encounter for preprocedural cardiovascular examination: Secondary | ICD-10-CM | POA: Insufficient documentation

## 2013-09-23 DIAGNOSIS — I10 Essential (primary) hypertension: Secondary | ICD-10-CM | POA: Insufficient documentation

## 2013-09-23 DIAGNOSIS — Z9861 Coronary angioplasty status: Secondary | ICD-10-CM | POA: Insufficient documentation

## 2013-09-23 DIAGNOSIS — I2510 Atherosclerotic heart disease of native coronary artery without angina pectoris: Secondary | ICD-10-CM | POA: Insufficient documentation

## 2013-09-23 DIAGNOSIS — I252 Old myocardial infarction: Secondary | ICD-10-CM | POA: Insufficient documentation

## 2013-09-23 DIAGNOSIS — F17200 Nicotine dependence, unspecified, uncomplicated: Secondary | ICD-10-CM | POA: Insufficient documentation

## 2013-09-23 MED ORDER — TECHNETIUM TC 99M SESTAMIBI GENERIC - CARDIOLITE
33.0000 | Freq: Once | INTRAVENOUS | Status: AC | PRN
  Administered 2013-09-23: 33 via INTRAVENOUS

## 2013-09-23 MED ORDER — REGADENOSON 0.4 MG/5ML IV SOLN
0.4000 mg | Freq: Once | INTRAVENOUS | Status: AC
  Administered 2013-09-23: 0.4 mg via INTRAVENOUS

## 2013-09-23 MED ORDER — TECHNETIUM TC 99M SESTAMIBI GENERIC - CARDIOLITE
11.0000 | Freq: Once | INTRAVENOUS | Status: AC | PRN
  Administered 2013-09-23: 11 via INTRAVENOUS

## 2013-09-23 NOTE — Progress Notes (Signed)
[facility] SITE 3 NUCLEAR MED [address] [place], [place] [address] [phone]    Cardiology Nuclear Med Study  Thomas [name] is a 72 y.o. male     MRN : [id number]     DOB: [birth year]  Procedure Date: 09/23/2013  Nuclear Med Background Indication for Stress Test:  Evaluation for Ischemia, Surgical Clearance for hip surgery on 10-28-13 by Dr. Trudee Grip, PTCA/Stent Patency. 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 IV 0.9% NS with Angio Cath:  22g  IV Site: R Antecubital, tolerated well. IV Started by:  Rickard Patience  Chest Size (in):  42 Cup Size: n/a  Height: 6' (1.829 m)  Weight:  199 lb (90.266 kg)  BMI:  Body mass index is 26.98 kg/(m^2). Tech Comments: n/a    Nuclear Med Study 1 or 2 day study: 1 day  Stress Test Type:  Lexiscan  Reading MD: Willa Rough, MD  Order Authorizing Provider:  Jannette Spanner  Resting Radionuclide: Technetium 19m Sestamibi  Resting Radionuclide Dose: 11.0 mCi   Stress Radionuclide:  Technetium 68m Sestamibi  Stress Radionuclide Dose: 33.0 mCi           Stress Protocol Rest HR: 64 Stress HR: 93  Rest BP: 142/88 Stress BP: 152/89  Exercise Time (min): n/a METS: n/a   Predicted Max HR: 149 bpm % Max HR: 62.42 bpm Rate Pressure Product: 46962   Dose of Adenosine (mg):  n/a Dose of Lexiscan: 0.4 mg  Dose of Atropine (mg): n/a Dose of Dobutamine: n/a mcg/kg/min (at max HR)  Stress Test Technologist: Cathlyn Parsons, RN  Nuclear Technologist:  Domenic Polite, CNMT     Rest Procedure:  Myocardial perfusion imaging was performed at rest 45 minutes following the intravenous administration of Technetium 58m Sestamibi. Rest ECG: Right bundle-branch block. Normal sinus rhythm with multiple  PACs  Stress Procedure:  The patient received IV Lexiscan 0.4 mg over 15-seconds.  Technetium 56m Sestamibi injected at 30-seconds.  Quantitative spect images were obtained after a 45 minute delay. Stress ECG: No significant change from baseline ECG  QPS Raw Data Images:  Normal; no motion artifact; normal heart/lung ratio. Stress Images:  Normal homogeneous uptake in all areas of the myocardium. Rest Images:  Normal homogeneous uptake in all areas of the myocardium. Subtraction (SDS):  No evidence of ischemia. Transient Ischemic Dilatation (Normal <1.22):   n/a Lung/Heart Ratio (Normal <0.45):  0.40  Quantitative Gated Spect Images QGS EDV:  n/a ml QGS ESV:  n/a ml  Impression Exercise Capacity:  Lexiscan with no exercise. BP Response:  Normal blood pressure response. Clinical Symptoms:  Moderate shortness of breath ECG Impression:  No significant ST segment change suggestive of ischemia. Comparison with Prior Nuclear Study: No previous nuclear study performed  Overall Impression:  There is no definite scar or ischemia. Wall motion cannot be assessed because the study could not be gated.  LV Ejection Fraction: Study not gated.  LV Wall Motion:     Study could not be gated. There were PACs and PVCs.   Willa Rough, MD

## 2013-10-08 NOTE — H&P (Signed)
TOTAL HIP ADMISSION H&P  Patient is admitted for right total hip arthroplasty.  Subjective:  Chief Complaint: right hip pain  HPI: Thomas [name], 72 y.o. male, has a history of pain and functional disability in the right hip(s) due to arthritis and patient has failed non-surgical conservative treatments for greater than 12 weeks to include NSAID's and/or analgesics, use of assistive devices and activity modification.  Onset of symptoms was gradual starting 4 years ago with gradually worsening course since that time.The patient noted no past surgery on the right hip(s).  Patient currently rates pain in the right hip at 6 out of 10 with activity. Patient has night pain, worsening of pain with activity and weight bearing, pain that interfers with activities of daily living, pain with passive range of motion and crepitus. Patient has evidence of periarticular osteophytes and joint space narrowing by imaging studies. This condition presents safety issues increasing the risk of falls.  There is no current active infection.  Patient Active Problem List   Diagnosis Date Noted  . CAD (coronary artery disease) 05/13/2011  . HTN (hypertension) 05/13/2011  . Hyperlipidemia 05/13/2011   Past Medical History  Diagnosis Date  . Hypertension   . Hyperlipidemia   . Arrhythmia   . Coronary artery disease     prior PTCA and stenting of the LAD with thrombus formation on the proximal edge of the stent which extended out over the LCX - on chronic Plavix.   . Thyroid disease     Past Surgical History  Procedure Laterality Date  . Cardiac catheterization    . Coronary angioplasty  05/21/2007    lad stent/taxus stent   Current outpatient prescriptions: aspirin 325 MG tablet, Take 325 mg by mouth daily.  , Disp: , Rfl: ;   clopidogrel (PLAVIX) 75 MG tablet, TAKE ONE TABLET BY MOUTH EVERY DAY, Disp: 30 tablet, Rfl: 9;   levothyroxine (LEVOTHROID) 150 MCG tablet, Take 150 mcg by mouth daily.  , Disp: , Rfl:  ;   nitroGLYCERIN (NITROSTAT) 0.4 MG SL tablet, Place 0.4 mg under the tongue every 5 (five) minutes as needed.  , Disp: , Rfl:  potassium chloride (KLOR-CON) 20 MEQ packet, Take 20 mEq by mouth daily., Disp: , Rfl: ;   simvastatin (ZOCOR) 40 MG tablet, TAKE ONE TABLET BY MOUTH EVERY DAY, Disp: 30 tablet, Rfl: 5;   triamterene-hydrochlorothiazide (MAXZIDE) 75-50 MG per tablet, Take 1 tablet by mouth daily., Disp: , Rfl:    No Known Allergies  History  Substance Use Topics  . Smoking status: Current Every Day Smoker -- 0.30 packs/day    Types: Cigarettes  . Smokeless tobacco: Not on file  . Alcohol Use: No    Family History Father deceased age 66 due to pneumonia Mother deceased age 68 to CHF  Review of Systems  Constitutional: Negative.   HENT: Positive for tinnitus. Negative for congestion, ear discharge, ear pain, hearing loss, nosebleeds and sore throat.   Eyes: Negative.   Respiratory: Positive for cough and wheezing. Negative for hemoptysis, sputum production, shortness of breath and stridor.   Cardiovascular: Negative.   Gastrointestinal: Negative.   Genitourinary: Negative.   Musculoskeletal: Positive for joint pain. Negative for back pain, falls, myalgias and neck pain.  Skin: Negative.   Neurological: Negative.  Negative for headaches.  Endo/Heme/Allergies: Negative.   Psychiatric/Behavioral: Negative.     Objective:  Physical Exam  Constitutional: He is oriented to person, place, and time. He appears well-developed and well-nourished. No distress.  HENT:  Head: Normocephalic and atraumatic.  Right Ear: External ear normal.  Left Ear: External ear normal.  Nose: Nose normal.  Mouth/Throat: Oropharynx is clear and moist.  Eyes: Conjunctivae and EOM are normal.  Neck: Normal range of motion. Neck supple.  Cardiovascular: Normal rate, regular rhythm, normal heart sounds and intact distal pulses.   No murmur heard. Respiratory: Effort normal. No respiratory  distress. He has wheezes.  GI: Soft. Bowel sounds are normal. He exhibits no distension. There is no tenderness.  Musculoskeletal:       Right hip: He exhibits decreased range of motion and decreased strength.       Left hip: Normal.       Right knee: Normal.       Left knee: Normal.       Right lower leg: He exhibits no tenderness and no swelling.       Left lower leg: He exhibits no tenderness and no swelling.  His left hip has normal range of motion without discomfort. The right hip flexion to about 90, no internal rotation, about 20 external rotation, 20 abduction. His knee exam is normal bilaterally. Pulses sensation and motor intact bilaterally.  Neurological: He is alert and oriented to person, place, and time. He has normal strength and normal reflexes. No sensory deficit.  Skin: No rash noted. He is not diaphoretic. No erythema.  Psychiatric: He has a normal mood and affect. His behavior is normal.    Vitals Weight: 204 lb Height: 72 in Body Surface Area: 2.17 m Body Mass Index: 27.67 kg/m Pulse: 72 (Regular) BP: 140/80 (Sitting, Left Arm, Standard)  Estimated body mass index is 28.86 kg/(m^2) as calculated from the following:   Height as of 12/21/11: 5\' 11"  (1.803 m).   Weight as of 12/21/11: 93.804 kg (206 lb 12.8 oz).   Imaging Review Plain radiographs demonstrate severe degenerative joint disease of the right hip(s). The bone quality appears to be good for age and reported activity level.  Assessment/Plan:  End stage arthritis, right hip(s)  The patient history, physical examination, clinical judgement of the provider and imaging studies are consistent with end stage degenerative joint disease of the right hip(s) and total hip arthroplasty is deemed medically necessary. The treatment options including medical management, injection therapy, arthroscopy and arthroplasty were discussed at length. The risks and benefits of total hip arthroplasty were presented  and reviewed. The risks due to aseptic loosening, infection, stiffness, dislocation/subluxation,  thromboembolic complications and other imponderables were discussed.  The patient acknowledged the explanation, agreed to proceed with the plan and consent was signed. Patient is being admitted for inpatient treatment for surgery, pain control, PT, OT, prophylactic antibiotics, VTE prophylaxis, progressive ambulation and ADL's and discharge planning.The patient is planning to be discharged home with home health services    [place], [place]

## 2013-10-09 NOTE — Progress Notes (Signed)
Need orders in EPIC.  Surgery scheduled for 10/28/13.  Thank You.  

## 2013-10-10 ENCOUNTER — Other Ambulatory Visit: Payer: Self-pay | Admitting: Orthopedic Surgery

## 2013-10-10 NOTE — Progress Notes (Signed)
Preoperative surgical orders have been place into the Epic hospital system for Thomas [name] on 10/10/2013, 5:23 PM  by Patrica Duel for surgery on 10/28/2013.  Preop Total Hip - Anterior Approach orders including Experel Injecion, PO Tylenol, and IV Decadron as long as there are no contraindications to the above medications. Avel Peace, PA-C

## 2013-10-18 ENCOUNTER — Encounter (HOSPITAL_COMMUNITY): Payer: Self-pay | Admitting: Pharmacy Technician

## 2013-10-21 NOTE — Patient Instructions (Addendum)
20 Thomas [name]  10/21/2013   Your procedure is scheduled on: 10/28/13  Report to [facility] at 12:15 PM.  Call this number if you have problems the morning of surgery [phone]: [phone]   Remember: please continue plavix   Do not eat food After Midnight, clear liquids from midnight until 9:15am on 10/28/13 then nothing.      Take these medicines the morning of surgery with A SIP OF WATER: synthroid    Do not wear jewelry, make-up or nail polish.  Do not wear lotions, powders, or perfumes. You may wear deodorant.  Do not shave 48 hours prior to surgery. Men may shave face and neck.  Do not bring valuables to the hospital.  Contacts, dentures or bridgework may not be worn into surgery.  Leave suitcase in the car. After surgery it may be brought to your room.  For patients admitted to the hospital, checkout time is 11:00 AM the day of discharge.   Please read over the following fact sheets that you were given: MRSA Information, blood fact sheet, incentive spirometry fact sheet.  Birdie Sons, RN  pre op nurse call if needed [phone]    FAILURE TO FOLLOW THESE INSTRUCTIONS MAY RESULT IN CANCELLATION OF YOUR SURGERY   Patient Signature: ___________________________________________

## 2013-10-21 NOTE — Progress Notes (Signed)
Surgery clearance note 08/23/13 Dr. Elease Hashimoto on chart, LOV note 09/11/13 Norma Fredrickson ANP-C on chart, LOV note 12/21/11 Dr. Elease Hashimoto on chart, EKG 09/11/13 on EPIC

## 2013-10-22 ENCOUNTER — Ambulatory Visit (HOSPITAL_COMMUNITY)
Admission: RE | Admit: 2013-10-22 | Discharge: 2013-10-22 | Disposition: A | Discharge status: Home / Self Care | Payer: Medicare Other | Source: Ambulatory Visit | Attending: Orthopedic Surgery | Admitting: Orthopedic Surgery

## 2013-10-22 ENCOUNTER — Telehealth: Payer: Self-pay | Admitting: Cardiovascular Disease

## 2013-10-22 ENCOUNTER — Encounter (HOSPITAL_COMMUNITY)
Admission: RE | Admit: 2013-10-22 | Discharge: 2013-10-22 | Disposition: A | Discharge status: Home / Self Care | Payer: Medicare Other | Source: Ambulatory Visit | Attending: Orthopedic Surgery | Admitting: Orthopedic Surgery

## 2013-10-22 ENCOUNTER — Encounter (HOSPITAL_COMMUNITY): Payer: Self-pay

## 2013-10-22 DIAGNOSIS — M1610 Unilateral primary osteoarthritis, unspecified hip: Secondary | ICD-10-CM | POA: Insufficient documentation

## 2013-10-22 DIAGNOSIS — M169 Osteoarthritis of hip, unspecified: Secondary | ICD-10-CM | POA: Insufficient documentation

## 2013-10-22 DIAGNOSIS — Z01812 Encounter for preprocedural laboratory examination: Secondary | ICD-10-CM | POA: Insufficient documentation

## 2013-10-22 HISTORY — DX: Gastro-esophageal reflux disease without esophagitis: K21.9

## 2013-10-22 HISTORY — DX: Unspecified osteoarthritis, unspecified site: M19.90

## 2013-10-22 HISTORY — DX: Pneumonia, unspecified organism: J18.9

## 2013-10-22 LAB — URINALYSIS, ROUTINE W REFLEX MICROSCOPIC
Bilirubin Urine: NEGATIVE
Glucose, UA: NEGATIVE mg/dL
Hgb urine dipstick: NEGATIVE
Ketones, ur: NEGATIVE mg/dL
Leukocytes, UA: NEGATIVE
Protein, ur: NEGATIVE mg/dL
Urobilinogen, UA: 0.2 mg/dL (ref 0.0–1.0)
pH: 8 (ref 5.0–8.0)

## 2013-10-22 LAB — COMPREHENSIVE METABOLIC PANEL
AST: 16 U/L (ref 0–37)
BUN: 12 mg/dL (ref 6–23)
CO2: 28 mEq/L (ref 19–32)
Calcium: 9.6 mg/dL (ref 8.4–10.5)
Creatinine, Ser: 1.09 mg/dL (ref 0.50–1.35)
GFR calc Af Amer: 77 mL/min — ABNORMAL LOW (ref >90)
GFR calc non Af Amer: 66 mL/min — ABNORMAL LOW (ref >90)
Glucose, Bld: 107 mg/dL — ABNORMAL HIGH (ref 70–99)

## 2013-10-22 LAB — CBC
HCT: 44.2 % (ref 39.0–52.0)
Hemoglobin: 15.1 g/dL (ref 13.0–17.0)
MCH: 33.1 pg (ref 26.0–34.0)
MCHC: 34.2 g/dL (ref 30.0–36.0)
MCV: 96.9 fL (ref 78.0–100.0)
Platelets: 249 10*3/uL (ref 150–400)
RBC: 4.56 MIL/uL (ref 4.22–5.81)

## 2013-10-22 LAB — SURGICAL PCR SCREEN: MRSA, PCR: NEGATIVE

## 2013-10-22 LAB — APTT: aPTT: 27 seconds (ref 24–37)

## 2013-10-22 LAB — PROTIME-INR
INR: 1.05 (ref 0.00–1.49)
Prothrombin Time: 13.5 seconds (ref 11.6–15.2)

## 2013-10-22 NOTE — Telephone Encounter (Signed)
New problem     office Receive medical clearance .    Clarification:  The morning on surgery should patient take plavix.

## 2013-10-22 NOTE — Telephone Encounter (Signed)
[facility] nurses request information. Does the pt take plavix day of surgery prior or after? Please review and advise.

## 2013-10-22 NOTE — Telephone Encounter (Signed)
He may hold Plavix the day of surgery.

## 2013-10-22 NOTE — Telephone Encounter (Signed)
I left message for Harley Hallmark at Dr. Deri Fuelling office regarding Dr. Harvie Bridge order that patient may hold Plavix day of surgery. I also faxed this note to Dr. Deri Fuelling office.

## 2013-10-23 NOTE — Progress Notes (Signed)
Received message from [place]. Pt is to hold plavix on day of surgery. Pt called and agreed.

## 2013-10-28 ENCOUNTER — Inpatient Hospital Stay (HOSPITAL_COMMUNITY): Payer: Medicare Other | Admitting: Certified Registered Nurse Anesthetist

## 2013-10-28 ENCOUNTER — Inpatient Hospital Stay (HOSPITAL_COMMUNITY): Payer: Medicare Other

## 2013-10-28 ENCOUNTER — Encounter (HOSPITAL_COMMUNITY): Payer: Medicare Other | Admitting: Certified Registered Nurse Anesthetist

## 2013-10-28 ENCOUNTER — Inpatient Hospital Stay (HOSPITAL_COMMUNITY)
Admission: RE | Admit: 2013-10-28 | Discharge: 2013-10-30 | DRG: 470 | Disposition: A | Discharge status: Home Health | Payer: Medicare Other | Source: Ambulatory Visit | Attending: Orthopedic Surgery | Admitting: Orthopedic Surgery

## 2013-10-28 ENCOUNTER — Encounter (HOSPITAL_COMMUNITY)
Admission: RE | Disposition: A | Discharge status: Home Health | Payer: Self-pay | Source: Ambulatory Visit | Attending: Orthopedic Surgery

## 2013-10-28 ENCOUNTER — Encounter (HOSPITAL_COMMUNITY): Payer: Self-pay | Admitting: *Deleted

## 2013-10-28 DIAGNOSIS — I10 Essential (primary) hypertension: Secondary | ICD-10-CM | POA: Diagnosis present

## 2013-10-28 DIAGNOSIS — E876 Hypokalemia: Secondary | ICD-10-CM

## 2013-10-28 DIAGNOSIS — Z86718 Personal history of other venous thrombosis and embolism: Secondary | ICD-10-CM

## 2013-10-28 DIAGNOSIS — K219 Gastro-esophageal reflux disease without esophagitis: Secondary | ICD-10-CM | POA: Diagnosis present

## 2013-10-28 DIAGNOSIS — M169 Osteoarthritis of hip, unspecified: Secondary | ICD-10-CM | POA: Diagnosis present

## 2013-10-28 DIAGNOSIS — Z7901 Long term (current) use of anticoagulants: Secondary | ICD-10-CM

## 2013-10-28 DIAGNOSIS — Z7982 Long term (current) use of aspirin: Secondary | ICD-10-CM

## 2013-10-28 DIAGNOSIS — F17200 Nicotine dependence, unspecified, uncomplicated: Secondary | ICD-10-CM | POA: Diagnosis present

## 2013-10-28 DIAGNOSIS — E079 Disorder of thyroid, unspecified: Secondary | ICD-10-CM | POA: Diagnosis present

## 2013-10-28 DIAGNOSIS — I2510 Atherosclerotic heart disease of native coronary artery without angina pectoris: Secondary | ICD-10-CM | POA: Diagnosis present

## 2013-10-28 DIAGNOSIS — D62 Acute posthemorrhagic anemia: Secondary | ICD-10-CM | POA: Diagnosis not present

## 2013-10-28 DIAGNOSIS — E785 Hyperlipidemia, unspecified: Secondary | ICD-10-CM | POA: Diagnosis present

## 2013-10-28 DIAGNOSIS — Z9861 Coronary angioplasty status: Secondary | ICD-10-CM

## 2013-10-28 DIAGNOSIS — M1610 Unilateral primary osteoarthritis, unspecified hip: Principal | ICD-10-CM | POA: Diagnosis present

## 2013-10-28 HISTORY — PX: TOTAL HIP ARTHROPLASTY: SHX124

## 2013-10-28 LAB — TYPE AND SCREEN
ABO/RH(D): O NEG
Antibody Screen: NEGATIVE

## 2013-10-28 SURGERY — ARTHROPLASTY, HIP, TOTAL, ANTERIOR APPROACH
Anesthesia: General | Site: Hip | Laterality: Right | Wound class: Clean

## 2013-10-28 SURGERY — EMBOLECTOMY
Anesthesia: General

## 2013-10-28 MED ORDER — ACETAMINOPHEN 500 MG PO TABS
1000.0000 mg | ORAL_TABLET | Freq: Four times a day (QID) | ORAL | Status: AC
  Administered 2013-10-28 – 2013-10-29 (×3): 1000 mg via ORAL
  Filled 2013-10-28 (×3): qty 2

## 2013-10-28 MED ORDER — FLEET ENEMA 7-19 GM/118ML RE ENEM: 1.0000 | ENEMA | Freq: Once | RECTAL | Status: AC | PRN

## 2013-10-28 MED ORDER — LACTATED RINGERS IV SOLN: INTRAVENOUS | Status: DC

## 2013-10-28 MED ORDER — SODIUM CHLORIDE 0.9 % IR SOLN
Status: DC | PRN
  Administered 2013-10-28: 1000 mL

## 2013-10-28 MED ORDER — MENTHOL 3 MG MT LOZG: 1.0000 | LOZENGE | OROMUCOSAL | Status: DC | PRN

## 2013-10-28 MED ORDER — BISACODYL 10 MG RE SUPP: 10.0000 mg | Freq: Every day | RECTAL | Status: DC | PRN

## 2013-10-28 MED ORDER — LEVOTHYROXINE SODIUM 200 MCG PO TABS
200.0000 ug | ORAL_TABLET | Freq: Every day | ORAL | Status: DC
Start: 2013-10-29 — End: 2013-10-30
  Administered 2013-10-29 – 2013-10-30 (×2): 200 ug via ORAL
  Filled 2013-10-28 (×3): qty 1

## 2013-10-28 MED ORDER — 0.9 % SODIUM CHLORIDE (POUR BTL) OPTIME
TOPICAL | Status: DC | PRN
  Administered 2013-10-28: 1000 mL

## 2013-10-28 MED ORDER — SODIUM CHLORIDE 0.9 % IJ SOLN
INTRAMUSCULAR | Status: AC
  Filled 2013-10-28: qty 50

## 2013-10-28 MED ORDER — DOCUSATE SODIUM 100 MG PO CAPS
100.0000 mg | ORAL_CAPSULE | Freq: Two times a day (BID) | ORAL | Status: DC
  Administered 2013-10-28 – 2013-10-30 (×4): 100 mg via ORAL
  Filled 2013-10-28 (×5): qty 1

## 2013-10-28 MED ORDER — HYDROMORPHONE HCL PF 1 MG/ML IJ SOLN
INTRAMUSCULAR | Status: AC
  Filled 2013-10-28: qty 1

## 2013-10-28 MED ORDER — POLYETHYLENE GLYCOL 3350 17 G PO PACK
17.0000 g | PACK | Freq: Every day | ORAL | Status: DC | PRN
  Filled 2013-10-28: qty 1

## 2013-10-28 MED ORDER — ACETAMINOPHEN 325 MG PO TABS: 650.0000 mg | ORAL_TABLET | Freq: Four times a day (QID) | ORAL | Status: DC | PRN

## 2013-10-28 MED ORDER — PROPOFOL 10 MG/ML IV BOLUS
INTRAVENOUS | Status: DC | PRN
  Administered 2013-10-28: 150 mg via INTRAVENOUS

## 2013-10-28 MED ORDER — DIPHENHYDRAMINE HCL 12.5 MG/5ML PO ELIX: 12.5000 mg | ORAL_SOLUTION | ORAL | Status: DC | PRN

## 2013-10-28 MED ORDER — FENTANYL CITRATE 0.05 MG/ML IJ SOLN
INTRAMUSCULAR | Status: DC | PRN
  Administered 2013-10-28: 100 ug via INTRAVENOUS
  Administered 2013-10-28 (×3): 50 ug via INTRAVENOUS
  Administered 2013-10-28: 100 ug via INTRAVENOUS
  Administered 2013-10-28: 50 ug via INTRAVENOUS

## 2013-10-28 MED ORDER — TRIAMTERENE-HCTZ 75-50 MG PO TABS
0.5000 | ORAL_TABLET | Freq: Every morning | ORAL | Status: DC
  Filled 2013-10-28: qty 0.5

## 2013-10-28 MED ORDER — GLYCOPYRROLATE 0.2 MG/ML IJ SOLN
INTRAMUSCULAR | Status: DC | PRN
  Administered 2013-10-28: 0.2 mg via INTRAVENOUS
  Administered 2013-10-28: 0.6 mg via INTRAVENOUS

## 2013-10-28 MED ORDER — POTASSIUM CHLORIDE IN NACL 20-0.9 MEQ/L-% IV SOLN
INTRAVENOUS | Status: DC
  Administered 2013-10-28: 22:00:00 via INTRAVENOUS
  Filled 2013-10-28 (×3): qty 1000

## 2013-10-28 MED ORDER — STERILE WATER FOR IRRIGATION IR SOLN
Status: DC | PRN
  Administered 2013-10-28: 3000 mL

## 2013-10-28 MED ORDER — BUPIVACAINE LIPOSOME 1.3 % IJ SUSP
INTRAMUSCULAR | Status: DC | PRN
  Administered 2013-10-28: 20 mL

## 2013-10-28 MED ORDER — NEOSTIGMINE METHYLSULFATE 1 MG/ML IJ SOLN
INTRAMUSCULAR | Status: DC | PRN
  Administered 2013-10-28: 4 mg via INTRAVENOUS

## 2013-10-28 MED ORDER — DEXAMETHASONE SODIUM PHOSPHATE 10 MG/ML IJ SOLN: 10.0000 mg | Freq: Once | INTRAMUSCULAR | Status: DC

## 2013-10-28 MED ORDER — METOCLOPRAMIDE HCL 10 MG PO TABS: 5.0000 mg | ORAL_TABLET | Freq: Three times a day (TID) | ORAL | Status: DC | PRN

## 2013-10-28 MED ORDER — SUCCINYLCHOLINE CHLORIDE 20 MG/ML IJ SOLN
INTRAMUSCULAR | Status: DC | PRN
  Administered 2013-10-28: 100 mg via INTRAVENOUS

## 2013-10-28 MED ORDER — ONDANSETRON HCL 4 MG PO TABS: 4.0000 mg | ORAL_TABLET | Freq: Four times a day (QID) | ORAL | Status: DC | PRN

## 2013-10-28 MED ORDER — SIMVASTATIN 40 MG PO TABS
40.0000 mg | ORAL_TABLET | Freq: Every evening | ORAL | Status: DC
  Administered 2013-10-29: 40 mg via ORAL
  Filled 2013-10-28 (×2): qty 1

## 2013-10-28 MED ORDER — ASPIRIN EC 325 MG PO TBEC
325.0000 mg | DELAYED_RELEASE_TABLET | Freq: Every day | ORAL | Status: DC
  Administered 2013-10-29 – 2013-10-30 (×2): 325 mg via ORAL
  Filled 2013-10-28 (×3): qty 1

## 2013-10-28 MED ORDER — LACTATED RINGERS IV SOLN
INTRAVENOUS | Status: DC | PRN
  Administered 2013-10-28 (×2): via INTRAVENOUS

## 2013-10-28 MED ORDER — ACETAMINOPHEN 500 MG PO TABS
1000.0000 mg | ORAL_TABLET | Freq: Once | ORAL | Status: AC
  Administered 2013-10-28: 1000 mg via ORAL
  Filled 2013-10-28: qty 2

## 2013-10-28 MED ORDER — LACTATED RINGERS IV SOLN
INTRAVENOUS | Status: DC
  Administered 2013-10-28: 1000 mL via INTRAVENOUS

## 2013-10-28 MED ORDER — BUPIVACAINE LIPOSOME 1.3 % IJ SUSP
20.0000 mL | Freq: Once | INTRAMUSCULAR | Status: DC
  Filled 2013-10-28: qty 20

## 2013-10-28 MED ORDER — BUPIVACAINE HCL (PF) 0.25 % IJ SOLN
INTRAMUSCULAR | Status: AC
  Filled 2013-10-28: qty 30

## 2013-10-28 MED ORDER — POTASSIUM CHLORIDE 20 MEQ PO PACK
20.0000 meq | PACK | Freq: Every day | ORAL | Status: DC
Start: 2013-10-28 — End: 2013-10-28

## 2013-10-28 MED ORDER — MORPHINE SULFATE 2 MG/ML IJ SOLN
1.0000 mg | INTRAMUSCULAR | Status: DC | PRN
  Administered 2013-10-28: 2 mg via INTRAVENOUS
  Filled 2013-10-28: qty 1

## 2013-10-28 MED ORDER — CEFAZOLIN SODIUM-DEXTROSE 2-3 GM-% IV SOLR
2.0000 g | INTRAVENOUS | Status: AC
  Administered 2013-10-28: 2 g via INTRAVENOUS

## 2013-10-28 MED ORDER — POTASSIUM CHLORIDE CRYS ER 20 MEQ PO TBCR
20.0000 meq | EXTENDED_RELEASE_TABLET | Freq: Every day | ORAL | Status: DC
  Administered 2013-10-28 – 2013-10-29 (×2): 20 meq via ORAL
  Filled 2013-10-28 (×3): qty 1

## 2013-10-28 MED ORDER — NITROGLYCERIN 0.4 MG SL SUBL: 0.4000 mg | SUBLINGUAL_TABLET | SUBLINGUAL | Status: DC | PRN

## 2013-10-28 MED ORDER — PHENOL 1.4 % MT LIQD: 1.0000 | OROMUCOSAL | Status: DC | PRN

## 2013-10-28 MED ORDER — LACTATED RINGERS IV SOLN
INTRAVENOUS | Status: DC
  Administered 2013-10-28: 1 via INTRAVENOUS

## 2013-10-28 MED ORDER — CLOPIDOGREL BISULFATE 75 MG PO TABS
75.0000 mg | ORAL_TABLET | Freq: Every day | ORAL | Status: DC
  Administered 2013-10-29 – 2013-10-30 (×2): 75 mg via ORAL
  Filled 2013-10-28 (×3): qty 1

## 2013-10-28 MED ORDER — OXYCODONE HCL 5 MG PO TABS
5.0000 mg | ORAL_TABLET | ORAL | Status: DC | PRN
  Administered 2013-10-29: 10 mg via ORAL
  Filled 2013-10-28: qty 2

## 2013-10-28 MED ORDER — METHOCARBAMOL 100 MG/ML IJ SOLN
500.0000 mg | Freq: Four times a day (QID) | INTRAVENOUS | Status: DC | PRN
  Filled 2013-10-28: qty 5

## 2013-10-28 MED ORDER — DEXAMETHASONE SODIUM PHOSPHATE 10 MG/ML IJ SOLN
10.0000 mg | Freq: Every day | INTRAMUSCULAR | Status: AC
  Filled 2013-10-28: qty 1

## 2013-10-28 MED ORDER — HYDROMORPHONE HCL PF 1 MG/ML IJ SOLN
0.2500 mg | INTRAMUSCULAR | Status: DC | PRN
  Administered 2013-10-28 (×2): 0.5 mg via INTRAVENOUS

## 2013-10-28 MED ORDER — SODIUM CHLORIDE 0.9 % IJ SOLN
INTRAMUSCULAR | Status: DC | PRN
  Administered 2013-10-28: 30 mL

## 2013-10-28 MED ORDER — TRAMADOL HCL 50 MG PO TABS: 50.0000 mg | ORAL_TABLET | Freq: Four times a day (QID) | ORAL | Status: DC | PRN

## 2013-10-28 MED ORDER — ONDANSETRON HCL 4 MG/2ML IJ SOLN: 4.0000 mg | Freq: Four times a day (QID) | INTRAMUSCULAR | Status: DC | PRN

## 2013-10-28 MED ORDER — ACETAMINOPHEN 650 MG RE SUPP: 650.0000 mg | Freq: Four times a day (QID) | RECTAL | Status: DC | PRN

## 2013-10-28 MED ORDER — ONDANSETRON HCL 4 MG/2ML IJ SOLN
INTRAMUSCULAR | Status: DC | PRN
  Administered 2013-10-28: 4 mg via INTRAVENOUS

## 2013-10-28 MED ORDER — METHOCARBAMOL 500 MG PO TABS: 500.0000 mg | ORAL_TABLET | Freq: Four times a day (QID) | ORAL | Status: DC | PRN

## 2013-10-28 MED ORDER — BUPIVACAINE HCL 0.25 % IJ SOLN
INTRAMUSCULAR | Status: DC | PRN
  Administered 2013-10-28: 20 mL

## 2013-10-28 MED ORDER — DEXAMETHASONE 6 MG PO TABS
10.0000 mg | ORAL_TABLET | Freq: Every day | ORAL | Status: AC
  Administered 2013-10-29: 10 mg via ORAL
  Filled 2013-10-28: qty 1

## 2013-10-28 MED ORDER — LIDOCAINE HCL (CARDIAC) 20 MG/ML IV SOLN
INTRAVENOUS | Status: DC | PRN
  Administered 2013-10-28: 75 mg via INTRAVENOUS

## 2013-10-28 MED ORDER — PHENYLEPHRINE HCL 10 MG/ML IJ SOLN
INTRAMUSCULAR | Status: DC | PRN
  Administered 2013-10-28 (×2): 80 ug via INTRAVENOUS

## 2013-10-28 MED ORDER — ROCURONIUM BROMIDE 100 MG/10ML IV SOLN
INTRAVENOUS | Status: DC | PRN
  Administered 2013-10-28: 40 mg via INTRAVENOUS

## 2013-10-28 MED ORDER — CEFAZOLIN SODIUM-DEXTROSE 2-3 GM-% IV SOLR
2.0000 g | Freq: Four times a day (QID) | INTRAVENOUS | Status: AC
  Administered 2013-10-28 – 2013-10-29 (×2): 2 g via INTRAVENOUS
  Filled 2013-10-28 (×2): qty 50

## 2013-10-28 MED ORDER — METOCLOPRAMIDE HCL 5 MG/ML IJ SOLN: 5.0000 mg | Freq: Three times a day (TID) | INTRAMUSCULAR | Status: DC | PRN

## 2013-10-28 MED ORDER — CEFAZOLIN SODIUM-DEXTROSE 2-3 GM-% IV SOLR
INTRAVENOUS | Status: AC
  Filled 2013-10-28: qty 50

## 2013-10-28 MED ORDER — SODIUM CHLORIDE 0.9 % IV SOLN: INTRAVENOUS | Status: DC

## 2013-10-28 MED ORDER — EPHEDRINE SULFATE 50 MG/ML IJ SOLN
INTRAMUSCULAR | Status: DC | PRN
  Administered 2013-10-28: 10 mg via INTRAVENOUS

## 2013-10-28 SURGICAL SUPPLY — 41 items
BAG SPEC THK2 15X12 ZIP CLS (MISCELLANEOUS) ×2
BAG ZIPLOCK 12X15 (MISCELLANEOUS) ×4 IMPLANT
BLADE SAW SGTL 18X1.27X75 (BLADE) ×2 IMPLANT
CAPT HIP PF MOP ×1 IMPLANT
DECANTER SPIKE VIAL GLASS SM (MISCELLANEOUS) ×2 IMPLANT
DRAPE C-ARM 42X120 X-RAY (DRAPES) ×2 IMPLANT
DRAPE STERI IOBAN 125X83 (DRAPES) ×2 IMPLANT
DRAPE U-SHAPE 47X51 STRL (DRAPES) ×6 IMPLANT
DRSG ADAPTIC 3X8 NADH LF (GAUZE/BANDAGES/DRESSINGS) ×2 IMPLANT
DRSG EMULSION OIL 3X16 NADH (GAUZE/BANDAGES/DRESSINGS) ×1 IMPLANT
DRSG MEPILEX BORDER 4X4 (GAUZE/BANDAGES/DRESSINGS) ×2 IMPLANT
DRSG MEPILEX BORDER 4X8 (GAUZE/BANDAGES/DRESSINGS) ×2 IMPLANT
DURAPREP 26ML APPLICATOR (WOUND CARE) ×2 IMPLANT
ELECT BLADE 6.5 EXT (BLADE) ×2 IMPLANT
ELECT REM PT RETURN 9FT ADLT (ELECTROSURGICAL) ×2
ELECTRODE REM PT RTRN 9FT ADLT (ELECTROSURGICAL) ×1 IMPLANT
EVACUATOR 1/8 PVC DRAIN (DRAIN) ×2 IMPLANT
FACESHIELD LNG OPTICON STERILE (SAFETY) ×8 IMPLANT
GLOVE BIO SURGEON STRL SZ7.5 (GLOVE) ×2 IMPLANT
GLOVE BIO SURGEON STRL SZ8 (GLOVE) ×4 IMPLANT
GLOVE BIOGEL PI IND STRL 8 (GLOVE) ×2 IMPLANT
GLOVE BIOGEL PI INDICATOR 8 (GLOVE) ×2
GOWN PREVENTION PLUS LG XLONG (DISPOSABLE) ×2 IMPLANT
GOWN STRL REIN XL XLG (GOWN DISPOSABLE) ×2 IMPLANT
KIT BASIN OR (CUSTOM PROCEDURE TRAY) ×2 IMPLANT
NDL SAFETY ECLIPSE 18X1.5 (NEEDLE) ×2 IMPLANT
NEEDLE HYPO 18GX1.5 SHARP (NEEDLE) ×4
PACK TOTAL JOINT (CUSTOM PROCEDURE TRAY) ×2 IMPLANT
PADDING CAST COTTON 6X4 STRL (CAST SUPPLIES) ×2 IMPLANT
SPONGE GAUZE 4X4 12PLY (GAUZE/BANDAGES/DRESSINGS) IMPLANT
STRIP CLOSURE SKIN 1/2X4 (GAUZE/BANDAGES/DRESSINGS) ×2 IMPLANT
SUCTION FRAZIER 12FR DISP (SUCTIONS) IMPLANT
SUT ETHIBOND NAB CT1 #1 30IN (SUTURE) ×2 IMPLANT
SUT MNCRL AB 4-0 PS2 18 (SUTURE) ×2 IMPLANT
SUT VIC AB 2-0 CT1 27 (SUTURE) ×4
SUT VIC AB 2-0 CT1 TAPERPNT 27 (SUTURE) ×2 IMPLANT
SUT VLOC 180 0 24IN GS25 (SUTURE) ×2 IMPLANT
SYR 20CC LL (SYRINGE) ×2 IMPLANT
SYR 50ML LL SCALE MARK (SYRINGE) ×2 IMPLANT
TOWEL OR 17X26 10 PK STRL BLUE (TOWEL DISPOSABLE) ×2 IMPLANT
TRAY FOLEY CATH 14FRSI W/METER (CATHETERS) ×2 IMPLANT

## 2013-10-28 NOTE — Transfer of Care (Signed)
Immediate Anesthesia Transfer of Care Note  Patient: Thomas [name]  Procedure(s) Performed: Procedure(s) (LRB): RIGHT TOTAL HIP ARTHROPLASTY ANTERIOR APPROACH (Right)  Patient Location: PACU  Anesthesia Type: General  Level of Consciousness: sedated, patient cooperative and responds to stimulation  Airway & Oxygen Therapy: Patient Spontanous Breathing and Patient connected to face mask oxgen  Post-op Assessment: Report given to PACU RN and Post -op Vital signs reviewed and stable  Post vital signs: Reviewed and stable  Complications: No apparent anesthesia complications

## 2013-10-28 NOTE — Anesthesia Postprocedure Evaluation (Signed)
  Anesthesia Post-op Note  Patient: Thomas [name]  Procedure(s) Performed: Procedure(s) (LRB): RIGHT TOTAL HIP ARTHROPLASTY ANTERIOR APPROACH (Right)  Patient Location: PACU  Anesthesia Type: General  Level of Consciousness: awake and alert   Airway and Oxygen Therapy: Patient Spontanous Breathing  Post-op Pain: mild  Post-op Assessment: Post-op Vital signs reviewed, Patient's Cardiovascular Status Stable, Respiratory Function Stable, Patent Airway and No signs of Nausea or vomiting  Last Vitals:  Filed Vitals:   10/28/13 1815  BP: 143/61  Pulse:   Temp: 36.8 C  Resp:     Post-op Vital Signs: stable   Complications: No apparent anesthesia complications

## 2013-10-28 NOTE — Op Note (Signed)
OPERATIVE REPORT  PREOPERATIVE DIAGNOSIS: Osteoarthritis of the Right hip.   POSTOPERATIVE DIAGNOSIS: Osteoarthritis of the Right  hip.   PROCEDURE: Right total hip arthroplasty, anterior approach.   SURGEON: Ollen Gross, MD   ASSISTANT: Avel Peace, PA-C  ANESTHESIA:  General  ESTIMATED BLOOD LOSS:- 400 ml  DRAINS: Hemovac x1.   COMPLICATIONS: None   CONDITION: PACU - hemodynamically stable.   BRIEF CLINICAL NOTE: Thomas [name] is a 72 y.o. male who has advanced end-  stage arthritis of his Right  hip with progressively worsening pain and  dysfunction.The patient has failed nonoperative management and presents for  total hip arthroplasty.   PROCEDURE IN DETAIL: After successful administration of spinal  anesthetic, the traction boots for the [facility] bed were placed on both  feet and the patient was placed onto the [facility] bed, boots placed into the leg  holders. The Right hip was then isolated from the perineum with plastic  drapes and prepped and draped in the usual sterile fashion. ASIS and  greater trochanter were marked and a oblique incision was made, starting  at about 1 cm lateral and 2 cm distal to the ASIS and coursing towards  the anterior cortex of the femur. The skin was cut with a 10 blade  through subcutaneous tissue to the level of the fascia overlying the  tensor fascia lata muscle. The fascia was then incised in line with the  incision at the junction of the anterior third and posterior 2/3rd. The  muscle was teased off the fascia and then the interval between the TFL  and the rectus was developed. The Hohmann retractor was then placed at  the top of the femoral neck over the capsule. The vessels overlying the  capsule were cauterized and the fat on top of the capsule was removed.  A Hohmann retractor was then placed anterior underneath the rectus  femoris to give exposure to the entire anterior capsule. A T-shaped  capsulotomy was performed.  The edges were tagged and the femoral head  was identified.       Osteophytes are removed off the superior acetabulum.  The femoral neck was then cut in situ with an oscillating saw. Traction  was then applied to the left lower extremity utilizing the [facility]  traction. The femoral head was then removed. Retractors were placed  around the acetabulum and then circumferential removal of the labrum was  performed. Osteophytes were also removed. Reaming starts at 47 mm to  medialize and  Increased in 2 mm increments to 51 mm. We reamed in  approximately 40 degrees of abduction, 20 degrees anteversion. A 52 mm  pinnacle acetabular shell was then impacted in anatomic position under  fluoroscopic guidance with excellent purchase. We did not need to place  any additional dome screws. A 36 mm neutral + 4 marathon liner was then  placed into the acetabular shell.       The femoral lift was then placed along the lateral aspect of the femur  just distal to the vastus ridge. The leg was  externally rotated and capsule  was stripped off the inferior aspect of the femoral neck down to the  level of the lesser trochanter, this was done with electrocautery. The femur was lifted after this was performed. The  leg was then placed and extended in adducted position to essentially delivering the femur. We also removed the capsule superiorly and the  piriformis from the piriformis  fossa to gain excellent exposure of the  proximal femur. Rongeur was used to remove some cancellous bone to get  into the lateral portion of the proximal femur for placement of the  initial starter reamer. The starter broaches was placed  the starter broach  and was shown to go down the center of the canal. Broaching  with the  Corail system was then performed starting at size 8, coursing  Up to size 13. A size 13 had excellent torsional and rotational  and axial stability. The trial standard offset neck was then placed  with a 36 + 1.5  trial head. The hip was then reduced. We confirmed that  the stem was in the canal both on AP and lateral x-rays. It also has excellent sizing. The hip was reduced with outstanding stability through full extension, full external rotation,  and then flexion in adduction internal rotation. AP pelvis was taken  and the leg lengths were measured and found to be exactly equal. Hip  was then dislocated again and the femoral head and neck removed. The  femoral broach was removed. Size 13 Corail stem with a standard offset  neck was then impacted into the femur following native anteversion. Has  excellent purchase in the canal. Excellent torsional and rotational and  axial stability. It is confirmed to be in the canal on AP and lateral  fluoroscopic views. The 36 + 1.5  metal head was placed and the hip  reduced with outstanding stability. Again AP pelvis was taken and it  confirmed that the leg lengths were equal. The wound was then copiously  irrigated with saline solution and the capsule reattached and repaired  with Ethibond suture.  20 mL of Exparel mixed with 50 mL of saline then additional 20 ml of .25% Bupivicaine injected into the capsule and into the edge of the tensor fascia lata as well as subcutaneous tissue. The fascia overlying the tensor fascia lata was  then closed with a running #1 V-Loc. Subcu was closed with interrupted  2-0 Vicryl and subcuticular running 4-0 Monocryl. Incision was cleaned  and dried. Steri-Strips and a bulky sterile dressing applied. Hemovac  drain was hooked to suction and then he was awakened and transported to  recovery in stable condition.        Please note that a surgical assistant was a medical necessity for this procedure to perform it in a safe and expeditious manner. Assistant was necessary to provide appropriate retraction of vital neurovascular structures and to prevent femoral fracture and allow for anatomic placement of the prosthesis.  Ollen Gross,  M.D.

## 2013-10-28 NOTE — Interval H&P Note (Signed)
History and Physical Interval Note:  10/28/2013 2:56 PM  Thomas [name]  has presented today for surgery, with the diagnosis of OA OF RIGHT HIP  The various methods of treatment have been discussed with the patient and family. After consideration of risks, benefits and other options for treatment, the patient has consented to  Procedure(s): RIGHT TOTAL HIP ARTHROPLASTY ANTERIOR APPROACH (Right) as a surgical intervention .  The patient's history has been reviewed, patient examined, no change in status, stable for surgery.  I have reviewed the patient's chart and labs.  Questions were answered to the patient's satisfaction.     Loanne Drilling

## 2013-10-28 NOTE — Anesthesia Preprocedure Evaluation (Addendum)
Anesthesia Evaluation  Patient identified by MRN, date of birth, ID band Patient awake    Reviewed: Allergy & Precautions, H&P , NPO status , Patient's Chart, lab work & pertinent test results  Airway Mallampati: II TM Distance: >3 FB Neck ROM: full    Dental no notable dental hx. (+) Teeth Intact and Dental Advisory Given   Pulmonary neg pulmonary ROS, Current Smoker,  breath sounds clear to auscultation  Pulmonary exam normal       Cardiovascular hypertension, Pt. on medications + CAD and + Cardiac Stents Rhythm:regular Rate:Normal  RBBB.  History clot around stent causing CP   Neuro/Psych negative neurological ROS  negative psych ROS   GI/Hepatic negative GI ROS, Neg liver ROS, GERD-  ,  Endo/Other  negative endocrine ROS  Renal/GU negative Renal ROS  negative genitourinary   Musculoskeletal   Abdominal   Peds  Hematology negative hematology ROS (+)   Anesthesia Other Findings   Reproductive/Obstetrics negative OB ROS                         Anesthesia Physical Anesthesia Plan  ASA: III  Anesthesia Plan: General   Post-op Pain Management:    Induction: Intravenous  Airway Management Planned: Oral ETT  Additional Equipment:   Intra-op Plan:   Post-operative Plan: Extubation in OR  Informed Consent: I have reviewed the patients History and Physical, chart, labs and discussed the procedure including the risks, benefits and alternatives for the proposed anesthesia with the patient or authorized representative who has indicated his/her understanding and acceptance.   Dental Advisory Given  Plan Discussed with: CRNA and Surgeon  Anesthesia Plan Comments:        Anesthesia Quick Evaluation

## 2013-10-28 NOTE — Preoperative (Signed)
Beta Blockers   Reason not to administer Beta Blockers:Not Applicable 

## 2013-10-29 ENCOUNTER — Encounter (HOSPITAL_COMMUNITY): Payer: Self-pay | Admitting: Orthopedic Surgery

## 2013-10-29 DIAGNOSIS — D62 Acute posthemorrhagic anemia: Secondary | ICD-10-CM | POA: Diagnosis not present

## 2013-10-29 LAB — CBC
Hemoglobin: 11.1 g/dL — ABNORMAL LOW (ref 13.0–17.0)
MCH: 33.9 pg (ref 26.0–34.0)
MCHC: 33.8 g/dL (ref 30.0–36.0)
MCV: 100.3 fL — ABNORMAL HIGH (ref 78.0–100.0)
Platelets: 208 10*3/uL (ref 150–400)
RBC: 3.27 MIL/uL — ABNORMAL LOW (ref 4.22–5.81)

## 2013-10-29 LAB — BASIC METABOLIC PANEL
BUN: 13 mg/dL (ref 6–23)
CO2: 30 mEq/L (ref 19–32)
Calcium: 8.3 mg/dL — ABNORMAL LOW (ref 8.4–10.5)
Creatinine, Ser: 1.15 mg/dL (ref 0.50–1.35)
GFR calc Af Amer: 72 mL/min — ABNORMAL LOW (ref >90)
GFR calc non Af Amer: 62 mL/min — ABNORMAL LOW (ref >90)
Glucose, Bld: 128 mg/dL — ABNORMAL HIGH (ref 70–99)

## 2013-10-29 MED ORDER — TRIAMTERENE-HCTZ 37.5-25 MG PO TABS
1.0000 | ORAL_TABLET | Freq: Every morning | ORAL | Status: DC
  Administered 2013-10-29 – 2013-10-30 (×2): 1 via ORAL
  Filled 2013-10-29 (×2): qty 1

## 2013-10-29 NOTE — Evaluation (Signed)
Physical Therapy Evaluation Patient Details Name: Thomas [name] MRN: [id number] DOB: Jun 26, 1941 Today's Date: 10/29/2013 Time: 4098-1191 PT Time Calculation (min): 57 min  PT Assessment / Plan / Recommendation History of Present Illness  [name]  Clinical Impression  Pt tolerated ambulation very well, only reports soreness in thigh, did take meds.ice applied.    PT Assessment  Patient needs continued PT services    Follow Up Recommendations  Home health PT    Does the patient have the potential to tolerate intense rehabilitation      Barriers to Discharge        Equipment Recommendations  None recommended by PT    Recommendations for Other Services     Frequency 7X/week    Precautions / Restrictions Precautions Precaution Comments: H/O PE Restrictions Weight Bearing Restrictions: Yes   Pertinent Vitals/Pain HR 87      Mobility  Bed Mobility Bed Mobility: Supine to Sit Supine to Sit: With rails;HOB elevated Details for Bed Mobility Assistance: cues for optimizing body position to get to edge of bed. Transfers Transfers: Sit to Stand;Stand to Sit Sit to Stand: 4: Min assist;From bed;With upper extremity assist Stand to Sit: To chair/3-in-1;With upper extremity assist Details for Transfer Assistance: cues for UE position  Ambulation/Gait Ambulation/Gait Assistance: 4: Min assist Ambulation Distance (Feet): 160 Feet Assistive device: Rolling walker Ambulation/Gait Assistance Details: cues for sequence and rolling walker. Pt is barely placing weight through UE's Gait Pattern: Step-through pattern;Step-to pattern;Antalgic    Exercises Total Joint Exercises Ankle Circles/Pumps: AROM;Both;10 reps Short Arc Quad: AROM;Right;10 reps;Supine Heel Slides: AROM;Right;10 reps;Supine Hip ABduction/ADduction: AROM;Right;10 reps;Supine Long Arc Quad: AROM;Right;10 reps;Seated   PT Diagnosis: Difficulty walking  PT Problem List: Decreased strength;Decreased range of  motion;Decreased activity tolerance;Decreased mobility;Decreased knowledge of precautions;Decreased safety awareness;Decreased knowledge of use of DME PT Treatment Interventions: DME instruction;Gait training;Stair training;Functional mobility training;Therapeutic activities;Therapeutic exercise;Patient/family education     PT Goals(Current goals can be found in the care plan section) Acute Rehab PT Goals Patient Stated Goal: I want to walk and not hurt PT Goal Formulation: With patient Time For Goal Achievement: 11/02/13 Potential to Achieve Goals: Good  Visit Information  Last PT Received On: 10/29/13 Assistance Needed: +1 History of Present Illness: [name]       Prior Functioning  Home Living Family/patient expects to be discharged to:: Private residence Living Arrangements: Spouse/significant other Available Help at Discharge: Family Type of Home: House Home Access: Stairs to enter Secretary/administrator of Steps: 3 Entrance Stairs-Rails: Can reach both Home Layout: One level Home Equipment: Walker - 2 wheels;Bedside commode;Shower seat;Cane - single point Prior Function Level of Independence: Independent with assistive device(s) Communication Communication: No difficulties    Cognition  Cognition Arousal/Alertness: Awake/alert Behavior During Therapy: WFL for tasks assessed/performed Overall Cognitive Status: Within Functional Limits for tasks assessed    Extremity/Trunk Assessment Upper Extremity Assessment Upper Extremity Assessment: Defer to OT evaluation Lower Extremity Assessment Lower Extremity Assessment: RLE deficits/detail RLE Deficits / Details: able to advance LE during ambulation   Balance    End of Session PT - End of Session Activity Tolerance: Patient tolerated treatment well Patient left: in chair;with call bell/phone within reach Nurse Communication: Mobility status  GP     Thomas [name] 10/29/2013, 10:06 AM Thomas [name]  PT [phone]

## 2013-10-29 NOTE — Evaluation (Addendum)
Occupational Therapy Evaluation Patient Details Name: Thomas [name] MRN: [id number] DOB: [birth year] Today's Date: 10/29/2013 Time: 4098-1191 OT Time Calculation (min): 17 min  OT Assessment / Plan / Recommendation History of present illness [name]   Clinical Impression   Pt doing well. Overall at min/mod assist level with ADL (LB self care) and min assist with functional transfers. Feel he will progress with PT for functional transfers and wife can help with ADL PRN. All education completed. Pt declines need to practice tub bench transfer as he states he has been using bench PTA.    OT Assessment  Patient does not need any further OT services    Follow Up Recommendations  No OT follow up;Supervision/Assistance - 24 hour    Barriers to Discharge      Equipment Recommendations  None recommended by OT    Recommendations for Other Services    Frequency       Precautions / Restrictions Precautions Precaution Comments: H/O PE Restrictions Weight Bearing Restrictions: No   Pertinent Vitals/Pain 1/10 at rest R hip 2/10 with activity; reposition, declines ice--states has been on hip this am.    ADL  Eating/Feeding: Simulated;Independent Where Assessed - Eating/Feeding: Chair Grooming: Performed;Wash/dry hands;Minimal assistance Where Assessed - Grooming: Unsupported standing Upper Body Bathing: Simulated;Chest;Right arm;Left arm;Abdomen;Set up Where Assessed - Upper Body Bathing: Unsupported sitting Lower Body Bathing: Simulated;Minimal assistance Where Assessed - Lower Body Bathing: Supported sit to stand Upper Body Dressing: Simulated;Set up Where Assessed - Upper Body Dressing: Unsupported sitting Lower Body Dressing: Simulated;Minimal assistance;Moderate assistance Where Assessed - Lower Body Dressing: Supported sit to Pharmacist, hospital: Performed;Minimal Web designer: Raised toilet seat with arms (or 3-in-1 over toilet) Toileting -  Clothing Manipulation and Hygiene: Simulated;Minimal assistance Where Assessed - Engineer, mining and Hygiene: Sit to stand from 3-in-1 or toilet Equipment Used: Rolling walker Min verbal cues for safe use with RW especially in tight spaces to not pick up the walker but rather side step through tighter space. He has a Sports administrator and states he can use for donning pants, etc and wife can also help.    OT Diagnosis:    OT Problem List:   OT Treatment Interventions:     OT Goals(Current goals can be found in the care plan section) Acute Rehab OT Goals Patient Stated Goal: I want to walk and not hurt  Visit Information  Last OT Received On: 10/29/13 Assistance Needed: +1 History of Present Illness: [name]       Prior Functioning     Home Living Family/patient expects to be discharged to:: Private residence Living Arrangements: Spouse/significant other Available Help at Discharge: Family Type of Home: House Home Access: Stairs to enter Secretary/administrator of Steps: 3 Entrance Stairs-Rails: Can reach both Home Layout: One level Home Equipment: Walker - 2 wheels;Bedside commode;Shower seat;Cane - single point;Tub bench;Adaptive equipment Adaptive Equipment: Reacher Prior Function Level of Independence: Independent with assistive device(s) Communication Communication: No difficulties         Vision/Perception     Cognition  Cognition Arousal/Alertness: Awake/alert Behavior During Therapy: WFL for tasks assessed/performed Overall Cognitive Status: Within Functional Limits for tasks assessed    Extremity/Trunk Assessment Upper Extremity Assessment Upper Extremity Assessment: Overall WFL for tasks assessed Lower Extremity Assessment Lower Extremity Assessment: RLE deficits/detail RLE Deficits / Details: able to advance LE during ambulation     Mobility  Transfers Transfers: Sit to Stand;Stand to Sit Sit to Stand: 4: Min assist;With upper extremity  assist;From  chair/3-in-1 Stand to Sit: 4: Min assist;With upper extremity assist Details for Transfer Assistance: cues for UE position, LE managment, min assist to rise and steady and control descent.        Balance Balance Balance Assessed: Yes Dynamic Standing Balance Dynamic Standing - Level of Assistance: 4: Min assist   End of Session OT - End of Session Equipment Utilized During Treatment: Rolling walker Activity Tolerance: Patient tolerated treatment well Patient left: in chair;with call bell/phone within reach  GO     Lennox Laity [phone] 10/29/2013, 12:00 PM

## 2013-10-29 NOTE — Progress Notes (Signed)
UR COMPLETED  

## 2013-10-29 NOTE — Progress Notes (Signed)
   Subjective: 1 Day Post-Op Procedure(s) (LRB): RIGHT TOTAL HIP ARTHROPLASTY ANTERIOR APPROACH (Right) Patient reports pain as mild.   Patient seen in rounds with Dr. Lequita Halt.  Placed on telemetry due to his history.  Contacted Cardiology yesterday and asked if they would come and see the patient today and follow.  He was instructed to remain on his Plavix so it was not stopped and he continues on this medication postop.   Patient is well, but has had some minor complaints of pain in the hip, requiring pain medications.  He denies any other symptoms at this time. We will start therapy today.  Plan is to go Home after hospital stay.  Objective: Vital signs in last 24 hours: Temp:  [97.2 F (36.2 C)-98.5 F (36.9 C)] 98.3 F (36.8 C) (11/18 0509) Pulse Rate:  [64-80] 72 (11/18 0509) Resp:  [16-28] 19 (11/18 0509) BP: (118-184)/(58-89) 119/58 mmHg (11/18 0509) SpO2:  [96 %-100 %] 100 % (11/18 0509) Weight:  [90.266 kg (199 lb)] 90.266 kg (199 lb) (11/17 1838)  Intake/Output from previous day:  Intake/Output Summary (Last 24 hours) at 10/29/13 0827 Last data filed at 10/29/13 0751  Gross per 24 hour  Intake   3210 ml  Output   1155 ml  Net   2055 ml    Intake/Output this shift: UOP 350 since MN +2055 KVO fluids and Saline lock IV if tolerates breakfast.  Labs:  Recent Labs  10/29/13 0446  HGB 11.1*    Recent Labs  10/29/13 0446  WBC 9.7  RBC 3.27*  HCT 32.8*  PLT 208    Recent Labs  10/29/13 0446  NA 138  K 3.7  CL 103  CO2 30  BUN 13  CREATININE 1.15  GLUCOSE 128*  CALCIUM 8.3*   No results found for this basename: LABPT, INR,  in the last 72 hours  EXAM General - Patient is Alert, Appropriate and Oriented Extremity - Neurovascular intact Sensation intact distally Dorsiflexion/Plantar flexion intact Dressing - dressing C/D/I Motor Function - intact, moving foot and toes well on exam.  Hemovac pulled without difficulty.  Past Medical History    Diagnosis Date  . Hypertension   . Hyperlipidemia   . Coronary artery disease     prior PTCA and stenting of the LAD with thrombus formation on the proximal edge of the stent which extended out over the LCX - on chronic Plavix.   . Thyroid disease   . Anginal pain 2008    from blood clot  . Pneumonia     at 72 years old, double  . GERD (gastroesophageal reflux disease)     occasional  . Arthritis   . Blood clot in vein 2009    "got hung on heart stent"    Assessment/Plan: 1 Day Post-Op Procedure(s) (LRB): RIGHT TOTAL HIP ARTHROPLASTY ANTERIOR APPROACH (Right) Principal Problem:   OA (osteoarthritis) of hip Active Problems:   CAD (coronary artery disease)   HTN (hypertension)   Hyperlipidemia   Postoperative anemia due to acute blood loss  Estimated body mass index is 26.98 kg/(m^2) as calculated from the following:   Height as of this encounter: 6' (1.829 m).   Weight as of this encounter: 90.266 kg (199 lb). Advance diet Up with therapy Discharge home with home health  DVT Prophylaxis - Aspirin and he remains on his Plavix Weight Bearing As Tolerated right Leg Hemovac Pulled Begin Therapy   Thomas [name] 10/29/2013, 8:27 AM

## 2013-10-29 NOTE — Care Management Note (Addendum)
    Page 1 of 1   10/30/2013     2:08:53 PM   CARE MANAGEMENT NOTE 10/30/2013  Patient:  Thomas [name]   Account Number:  [id number]  Date Initiated:  10/29/2013  Documentation initiated by:  Lanier Clam  Subjective/Objective Assessment:   72 Y/O M ADMITTED W/OA R HIP.     Action/Plan:   FROM HOME W/SPOUSE.HAS PCP,PHARMACY.HAS CANE,RW,3N1.   Anticipated DC Date:  10/30/2013   Anticipated DC Plan:  HOME W HOME HEALTH SERVICES      DC Planning Services  CM consult      Choice offered to / List presented to:  C-1 Patient        HH arranged  HH-2 PT      [facility] agency  [facility]   Status of service:  Completed, signed off Medicare Important Message given?   (If response is "NO", the following Medicare IM given date fields will be blank) Date Medicare IM given:   Date Additional Medicare IM given:    Discharge Disposition:  HOME W HOME HEALTH SERVICES  Per UR Regulation:  Reviewed for med. necessity/level of care/duration of stay  If discussed at Long Length of Stay Meetings, dates discussed:    Comments:  10/30/13 Thomas Cafiero RN,BSN NCM [phone] HHPT ORDERED.[facility] REP DEBBIE AWARE OF HHPT ORDER,& D/C.  10/29/13 Analyssa Downs RN,BSN NCM [phone] POD#1 R THA.ARRHYTHMIA.PT-HH.[facility] CHOSEN FOR HHC.SPOKE TO REP DEBBIE AWARE, & FOLLOWING FOR HHC ORDERS.

## 2013-10-29 NOTE — Progress Notes (Signed)
Physical Therapy Treatment Patient Details Name: Thomas [name] MRN: [id number] DOB: 1941-06-13 Today's Date: 10/29/2013 Time: 5956-3875 PT Time Calculation (min): 20 min  PT Assessment / Plan / Recommendation  History of Present Illness [name]   PT Comments   Pt progressing well. Only reported burning L thigh when he stood, subsided after a=walking.  Follow Up Recommendations  Home health PT     Does the patient have the potential to tolerate intense rehabilitation     Barriers to Discharge        Equipment Recommendations  None recommended by PT    Recommendations for Other Services    Frequency 7X/week   Progress towards PT Goals Progress towards PT goals: Progressing toward goals  Plan Current plan remains appropriate    Precautions / Restrictions Precautions Precautions: Fall Precaution Comments: H/O PE Restrictions Weight Bearing Restrictions: No   Pertinent Vitals/Pain Min c/o    Mobility  Bed Mobility Bed Mobility: Sit to Supine Sit to Supine: 4: Min guard Details for Bed Mobility Assistance: extra effort to get LLE onto bed but did make it. Transfers Sit to Stand: From chair/3-in-1;5: Supervision;With upper extremity assist Stand to Sit: 5: Supervision;To bed;With upper extremity assist;To elevated surface Details for Transfer Assistance: cues for UE position, LE managment, min assist to rise and steady and control descent. Ambulation/Gait Ambulation/Gait Assistance: 4: Min guard Ambulation Distance (Feet): 200 Feet Assistive device: Rolling walker Ambulation/Gait Assistance Details: cues for sequence and posture. Gait Pattern: Antalgic;Step-through pattern    Exercises     PT Diagnosis:    PT Problem List:   PT Treatment Interventions:     PT Goals (current goals can now be found in the care plan section) Acute Rehab PT Goals Patient Stated Goal: I want to walk and not hurt  Visit Information  Last PT Received On: 10/29/13 Assistance  Needed: +1 History of Present Illness: [name]    Subjective Data  Patient Stated Goal: I want to walk and not hurt   Cognition  Cognition Arousal/Alertness: Awake/alert Behavior During Therapy: WFL for tasks assessed/performed Overall Cognitive Status: Within Functional Limits for tasks assessed    Balance  Balance Balance Assessed: Yes Dynamic Standing Balance Dynamic Standing - Level of Assistance: 4: Min assist  End of Session PT - End of Session Activity Tolerance: Patient tolerated treatment well Patient left: with call bell/phone within reach;in bed Nurse Communication: Mobility status   GP     [name] 10/29/2013, 3:26 PM Blanchard Kelch PT [phone]

## 2013-10-30 DIAGNOSIS — E876 Hypokalemia: Secondary | ICD-10-CM

## 2013-10-30 LAB — CBC
Hemoglobin: 9.2 g/dL — ABNORMAL LOW (ref 13.0–17.0)
MCH: 34.2 pg — ABNORMAL HIGH (ref 26.0–34.0)
MCHC: 35.2 g/dL (ref 30.0–36.0)
MCV: 97 fL (ref 78.0–100.0)
Platelets: 185 10*3/uL (ref 150–400)
RDW: 13.3 % (ref 11.5–15.5)

## 2013-10-30 LAB — BASIC METABOLIC PANEL
CO2: 27 mEq/L (ref 19–32)
Calcium: 8.7 mg/dL (ref 8.4–10.5)
Chloride: 98 mEq/L (ref 96–112)
Creatinine, Ser: 1.08 mg/dL (ref 0.50–1.35)
GFR calc Af Amer: 78 mL/min — ABNORMAL LOW (ref >90)
GFR calc non Af Amer: 67 mL/min — ABNORMAL LOW (ref >90)
Potassium: 2.7 mEq/L — CL (ref 3.5–5.1)
Sodium: 133 mEq/L — ABNORMAL LOW (ref 135–145)

## 2013-10-30 LAB — POTASSIUM: Potassium: 2.6 mEq/L — CL (ref 3.5–5.1)

## 2013-10-30 MED ORDER — TRAMADOL HCL 50 MG PO TABS: 50.0000 mg | ORAL_TABLET | Freq: Four times a day (QID) | ORAL | Status: DC | PRN

## 2013-10-30 MED ORDER — POTASSIUM CHLORIDE CRYS ER 20 MEQ PO TBCR
40.0000 meq | EXTENDED_RELEASE_TABLET | ORAL | Status: AC
  Administered 2013-10-30 (×3): 40 meq via ORAL
  Filled 2013-10-30 (×3): qty 2

## 2013-10-30 MED ORDER — OXYCODONE HCL 5 MG PO TABS: 5.0000 mg | ORAL_TABLET | ORAL | Status: DC | PRN

## 2013-10-30 MED ORDER — METHOCARBAMOL 500 MG PO TABS: 500.0000 mg | ORAL_TABLET | Freq: Four times a day (QID) | ORAL | Status: DC | PRN

## 2013-10-30 NOTE — Discharge Summary (Signed)
Physician Discharge Summary   Patient ID: ZYION DOXTATER MRN: [id number] DOB/AGE: January 13, 1941 72 y.o.  Admit date: 10/28/2013 Discharge date: 10/30/2013  Primary Diagnosis:  Osteoarthritis of the Right hip.  Admission Diagnoses:  Past Medical History  Diagnosis Date  . Hypertension   . Hyperlipidemia   . Coronary artery disease     prior PTCA and stenting of the LAD with thrombus formation on the proximal edge of the stent which extended out over the LCX - on chronic Plavix.   . Thyroid disease   . Anginal pain 2008    from blood clot  . Pneumonia     at 72 years old, double  . GERD (gastroesophageal reflux disease)     occasional  . Arthritis   . Blood clot in vein 2009    "got hung on heart stent"   Discharge Diagnoses:   Principal Problem:   OA (osteoarthritis) of hip Active Problems:   CAD (coronary artery disease)   HTN (hypertension)   Hyperlipidemia   Postoperative anemia due to acute blood loss   Hypokalemia  Estimated body mass index is 26.98 kg/(m^2) as calculated from the following:   Height as of this encounter: 6' (1.829 m).   Weight as of this encounter: 90.266 kg (199 lb).  Procedure(s) (LRB): RIGHT TOTAL HIP ARTHROPLASTY ANTERIOR APPROACH (Right)   Consults: None  HPI: Thomas [name] is a 72 y.o. male who has advanced end-  stage arthritis of his Right hip with progressively worsening pain and  dysfunction.The patient has failed nonoperative management and presents for  total hip arthroplasty.   Laboratory Data: Admission on 10/28/2013  Component Date Value Range Status  . WBC 10/29/2013 9.7  4.0 - 10.5 K/uL Final  . RBC 10/29/2013 3.27* 4.22 - 5.81 MIL/uL Final  . Hemoglobin 10/29/2013 11.1* 13.0 - 17.0 g/dL Final  . HCT 57/84/6962 32.8* 39.0 - 52.0 % Final  . MCV 10/29/2013 100.3* 78.0 - 100.0 fL Final  . MCH 10/29/2013 33.9  26.0 - 34.0 pg Final  . MCHC 10/29/2013 33.8  30.0 - 36.0 g/dL Final  . RDW 95/28/4132 13.7  11.5 - 15.5 %  Final  . Platelets 10/29/2013 208  150 - 400 K/uL Final  . Sodium 10/29/2013 138  135 - 145 mEq/L Final  . Potassium 10/29/2013 3.7  3.5 - 5.1 mEq/L Final  . Chloride 10/29/2013 103  96 - 112 mEq/L Final  . CO2 10/29/2013 30  19 - 32 mEq/L Final  . Glucose, Bld 10/29/2013 128* 70 - 99 mg/dL Final  . BUN 44/12/270 13  6 - 23 mg/dL Final  . Creatinine, Ser 10/29/2013 1.15  0.50 - 1.35 mg/dL Final  . Calcium 53/66/4403 8.3* 8.4 - 10.5 mg/dL Final  . GFR calc non Af Amer 10/29/2013 62* >90 mL/min Final  . GFR calc Af Amer 10/29/2013 72* >90 mL/min Final   Comment: (NOTE)                          The eGFR has been calculated using the CKD EPI equation.                          This calculation has not been validated in all clinical situations.                          eGFR's persistently <90 mL/min signify possible Chronic  Kidney                          Disease.  . WBC 10/30/2013 11.7* 4.0 - 10.5 K/uL Final  . RBC 10/30/2013 2.69* 4.22 - 5.81 MIL/uL Final  . Hemoglobin 10/30/2013 9.2* 13.0 - 17.0 g/dL Final   Comment: REPEATED TO VERIFY                          DELTA CHECK NOTED  . HCT 10/30/2013 26.1* 39.0 - 52.0 % Final  . MCV 10/30/2013 97.0  78.0 - 100.0 fL Final  . MCH 10/30/2013 34.2* 26.0 - 34.0 pg Final  . MCHC 10/30/2013 35.2  30.0 - 36.0 g/dL Final  . RDW 16/09/9603 13.3  11.5 - 15.5 % Final  . Platelets 10/30/2013 185  150 - 400 K/uL Final  . Sodium 10/30/2013 133* 135 - 145 mEq/L Final  . Potassium 10/30/2013 2.7* 3.5 - 5.1 mEq/L Final   Comment: REPEATED TO VERIFY                          DELTA CHECK NOTED                          CRITICAL RESULT CALLED TO, READ BACK BY AND VERIFIED WITH:                          T. MILLER RN AT  0530 ON 11.19.14 BY SHUEA  . Chloride 10/30/2013 98  96 - 112 mEq/L Final  . CO2 10/30/2013 27  19 - 32 mEq/L Final  . Glucose, Bld 10/30/2013 133* 70 - 99 mg/dL Final  . BUN 54/08/8118 14  6 - 23 mg/dL Final  . Creatinine, Ser 10/30/2013 1.08   0.50 - 1.35 mg/dL Final  . Calcium 14/78/2956 8.7  8.4 - 10.5 mg/dL Final  . GFR calc non Af Amer 10/30/2013 67* >90 mL/min Final  . GFR calc Af Amer 10/30/2013 78* >90 mL/min Final   Comment: (NOTE)                          The eGFR has been calculated using the CKD EPI equation.                          This calculation has not been validated in all clinical situations.                          eGFR's persistently <90 mL/min signify possible Chronic Kidney                          Disease.  [name] Potassium 10/30/2013 2.6* 3.5 - 5.1 mEq/L Final   Comment: CRITICAL RESULT CALLED TO, READ BACK BY AND VERIFIED WITH:                          D. DARK RNAT 0755 ON 11.19.14 BY [facility]patient Visit on 10/22/2013  Component Date Value Range Status  . MRSA, PCR 10/22/2013 NEGATIVE  NEGATIVE Final  . Staphylococcus aureus 10/22/2013 NEGATIVE  NEGATIVE Final   Comment:  The Xpert SA Assay (FDA                          approved for NASAL specimens                          in patients over 8 years of age),                          is one component of                          a comprehensive surveillance                          program.  Test performance has                          been validated by [employer] for patients greater                          than or equal to 80 year old.                          It is not intended                          to diagnose infection nor to                          guide or monitor treatment.  [name] aPTT 10/22/2013 27  24 - 37 seconds Final  . WBC 10/22/2013 6.8  4.0 - 10.5 K/uL Final  . RBC 10/22/2013 4.56  4.22 - 5.81 MIL/uL Final  . Hemoglobin 10/22/2013 15.1  13.0 - 17.0 g/dL Final  . HCT 16/09/9603 44.2  39.0 - 52.0 % Final  . MCV 10/22/2013 96.9  78.0 - 100.0 fL Final  . MCH 10/22/2013 33.1  26.0 - 34.0 pg Final  . MCHC 10/22/2013 34.2  30.0 - 36.0 g/dL Final  . RDW 54/08/8118 13.2   11.5 - 15.5 % Final  . Platelets 10/22/2013 249  150 - 400 K/uL Final  . Sodium 10/22/2013 138  135 - 145 mEq/L Final  . Potassium 10/22/2013 3.2* 3.5 - 5.1 mEq/L Final  . Chloride 10/22/2013 101  96 - 112 mEq/L Final  . CO2 10/22/2013 28  19 - 32 mEq/L Final  . Glucose, Bld 10/22/2013 107* 70 - 99 mg/dL Final  . BUN 14/78/2956 12  6 - 23 mg/dL Final  . Creatinine, Ser 10/22/2013 1.09  0.50 - 1.35 mg/dL Final  . Calcium 21/30/8657 9.6  8.4 - 10.5 mg/dL Final  . Total Protein 10/22/2013 6.9  6.0 - 8.3 g/dL Final  . Albumin 84/69/6295 3.9  3.5 - 5.2 g/dL Final  . AST 28/41/3244 16  0 - 37 U/L Final  . ALT 10/22/2013 13  0 - 53 U/L Final  . Alkaline Phosphatase 10/22/2013 66  39 - 117 U/L Final  . Total Bilirubin 10/22/2013 0.5  0.3 - 1.2 mg/dL Final  .  GFR calc non Af Amer 10/22/2013 66* >90 mL/min Final  . GFR calc Af Amer 10/22/2013 77* >90 mL/min Final   Comment: (NOTE)                          The eGFR has been calculated using the CKD EPI equation.                          This calculation has not been validated in all clinical situations.                          eGFR's persistently <90 mL/min signify possible Chronic Kidney                          Disease.  [name] Prothrombin Time 10/22/2013 13.5  11.6 - 15.2 seconds Final  . INR 10/22/2013 1.05  0.00 - 1.49 Final  . ABO/RH(D) 10/22/2013 O NEG   Final  . Antibody Screen 10/22/2013 NEG   Final  . Sample Expiration 10/22/2013 10/31/2013   Final  . Color, Urine 10/22/2013 YELLOW  YELLOW Final  . APPearance 10/22/2013 CLEAR  CLEAR Final  . Specific Gravity, Urine 10/22/2013 1.021  1.005 - 1.030 Final  . pH 10/22/2013 8.0  5.0 - 8.0 Final  . Glucose, UA 10/22/2013 NEGATIVE  NEGATIVE mg/dL Final  . Hgb urine dipstick 10/22/2013 NEGATIVE  NEGATIVE Final  . Bilirubin Urine 10/22/2013 NEGATIVE  NEGATIVE Final  . Ketones, ur 10/22/2013 NEGATIVE  NEGATIVE mg/dL Final  . Protein, ur 40/98/1191 NEGATIVE  NEGATIVE mg/dL Final  . Urobilinogen,  UA 10/22/2013 0.2  0.0 - 1.0 mg/dL Final  . Nitrite 47/82/9562 NEGATIVE  NEGATIVE Final  . Leukocytes, UA 10/22/2013 NEGATIVE  NEGATIVE Final   MICROSCOPIC NOT DONE ON URINES WITH NEGATIVE PROTEIN, BLOOD, LEUKOCYTES, NITRITE, OR GLUCOSE <1000 mg/dL.  . ABO/RH(D) 10/22/2013 O NEG   Final     X-Rays:Dg Chest 2 View  10/22/2013   CLINICAL DATA:  Preop right total hip arthroplasty.  EXAM: CHEST  2 VIEW  COMPARISON:  05/16/2007.  FINDINGS: The heart size and mediastinal contours are within normal limits. Both lungs are clear. The visualized skeletal structures are unremarkable.  IMPRESSION: No active cardiopulmonary disease.   Electronically Signed   By: Elige Ko   On: 10/22/2013 11:45   Dg Hip Complete Right  10/22/2013   CLINICAL DATA:  Right total hip arthroplasty preop.  EXAM: RIGHT HIP - COMPLETE 2+ VIEW  COMPARISON:  None.  FINDINGS: There is severe right hip degenerative joint disease with severe joint space narrowing with a bone-on-bone appearance, marginal osteophytosis and subchondral sclerosis and cystic changes. There is no fracture or dislocation. There are mild degenerative changes of the left hip. The SI joints are unremarkable.  IMPRESSION: Severe right hip osteoarthritis.   Electronically Signed   By: Elige Ko   On: 10/22/2013 11:45   Dg Pelvis Portable  10/28/2013   CLINICAL DATA:  Postop right total hip replacement  EXAM: PORTABLE PELVIS 1-2 VIEWS  COMPARISON:  None.  FINDINGS: Femoral and acetabular components of right hip replacement appear in anticipated position. There is postoperative fluid and air projecting over the hip joint with a surgical drain. There is moderate left hip arthritis.  IMPRESSION: Anticipated postoperative appearance   Electronically Signed   By: Esperanza Heir M.D.   On: 10/28/2013  19:51   Dg C-arm 1-60 Min-no Report  10/28/2013   CLINICAL DATA: right total hip replacement   C-ARM 1-60 MINUTES  Fluoroscopy was utilized by the requesting physician.   No radiographic  interpretation.     EKG: Orders placed in visit on 09/11/13  . EKG 12-LEAD     Hospital Course: Patient was admitted to [facility] and taken to the OR and underwent the above state procedure without complications.  Patient tolerated the procedure well and was later transferred to the recovery room and then to the orthopaedic floor for postoperative care.  They were given PO and IV analgesics for pain control following their surgery.  They were given 24 hours of postoperative antibiotics of  Anti-infectives   Start     Dose/Rate Route Frequency Ordered Stop   10/29/13 0600  ceFAZolin (ANCEF) IVPB 2 g/50 mL premix     2 g 100 mL/hr over 30 Minutes Intravenous On call to O.R. 10/28/13 1222 10/28/13 1549   10/28/13 2200  ceFAZolin (ANCEF) IVPB 2 g/50 mL premix     2 g 100 mL/hr over 30 Minutes Intravenous Every 6 hours 10/28/13 1833 10/29/13 0527     and started on DVT prophylaxis in the form of Aspirin and Plavix.   PT and OT were ordered for total hip protocol.  The patient was allowed to be WBAT with therapy. Discharge planning was consulted to help with postop disposition and equipment needs.  Patient had a tough night on the evening of surgery. Patient seen in rounds with Dr. Lequita Halt. Placed on telemetry due to his history. Contacted Cardiology yesterday and asked if they would come and see the patient today and follow. He was instructed to remain on his Plavix so it was not stopped and he continues on this medication postop.  Patient is well, but has had some minor complaints of pain in the hip, requiring pain medications. He denies any other symptoms at this time. They started to get up OOB with therapy on day one.  Hemovac drain was pulled without difficulty.  Continued to work with therapy into day two.  Dressing was changed on day two and the incision was healing well. Patient was seen in rounds, doing better, and was ready to go home. Discharge home with home  health  Diet - Cardiac diet  Follow up - in 2 weeks  Activity - WBAT  Disposition - Home  Condition Upon Discharge - Good  D/C Meds - See DC Summary  DVT Prophylaxis - Aspirin and Plavix    Discharge Orders   Future Orders Complete By Expires   Call MD / Call 911  As directed    Comments:     If you experience chest pain or shortness of breath, CALL 911 and be transported to the hospital emergency room.  If you develope a fever above 101 F, pus (white drainage) or increased drainage or redness at the wound, or calf pain, call your surgeon's office.   Change dressing  As directed    Comments:     You may change your dressing dressing daily with sterile 4 x 4 inch gauze dressing and paper tape.  Do not submerge the incision under water.   Constipation Prevention  As directed    Comments:     Drink plenty of fluids.  Prune juice may be helpful.  You may use a stool softener, such as Colace (over the counter) 100 mg twice a day.  Use MiraLax (  over the counter) for constipation as needed.   Diet - low sodium heart healthy  As directed    Discharge instructions  As directed    Comments:     Pick up stool softner and laxative for home. Do not submerge incision under water. May shower. Continue to use ice for pain and swelling from surgery. Total Hip Protocol. Resumed his Plavix and Aspirin at home.   Do not sit on low chairs, stoools or toilet seats, as it may be difficult to get up from low surfaces  As directed    Driving restrictions  As directed    Comments:     No driving until released by the physician.   Increase activity slowly as tolerated  As directed    Lifting restrictions  As directed    Comments:     No lifting until released by the physician.   Patient may shower  As directed    Comments:     You may shower without a dressing once there is no drainage.  Do not wash over the wound.  If drainage remains, do not shower until drainage stops.   TED hose  As directed     Comments:     Use stockings (TED hose) for 3 weeks on both leg(s).  You may remove them at night for sleeping.   Weight bearing as tolerated  As directed    Questions:     Laterality:     Extremity:         Medication List         aspirin 325 MG tablet  Take 325 mg by mouth daily.     beta carotene w/minerals tablet  Take 1 tablet by mouth daily.     clopidogrel 75 MG tablet  Commonly known as:  PLAVIX  Take 75 mg by mouth daily with breakfast.     levothyroxine 200 MCG tablet  Commonly known as:  SYNTHROID, LEVOTHROID  Take 200 mcg by mouth daily before breakfast.     methocarbamol 500 MG tablet  Commonly known as:  ROBAXIN  Take 1 tablet (500 mg total) by mouth every 6 (six) hours as needed for muscle spasms.     multivitamin with minerals Tabs tablet  Take 1 tablet by mouth daily.     nitroGLYCERIN 0.4 MG SL tablet  Commonly known as:  NITROSTAT  Place 0.4 mg under the tongue every 5 (five) minutes as needed for chest pain.     oxyCODONE 5 MG immediate release tablet  Commonly known as:  Oxy IR/ROXICODONE  Take 1-2 tablets (5-10 mg total) by mouth every 3 (three) hours as needed for breakthrough pain.     potassium chloride 20 MEQ packet  Commonly known as:  KLOR-CON  Take 20 mEq by mouth daily.     simvastatin 40 MG tablet  Commonly known as:  ZOCOR  Take 40 mg by mouth every evening.     traMADol 50 MG tablet  Commonly known as:  ULTRAM  Take 1-2 tablets (50-100 mg total) by mouth every 6 (six) hours as needed (mild pain).     triamterene-hydrochlorothiazide 75-50 MG per tablet  Commonly known as:  MAXZIDE  Take 0.5 tablets by mouth every morning.           Follow-up Information   Follow up with Loanne Drilling, MD. Schedule an appointment as soon as possible for a visit on 11/12/2013. (Call [phone] to make the appointment)    Specialty:  Orthopedic Surgery   Contact information:   [address] [place]  [address] [phone]       Signed: Patrica Duel 10/30/2013, 1:08 PM

## 2013-10-30 NOTE — Progress Notes (Signed)
   Subjective: 2 Days Post-Op Procedure(s) (LRB): RIGHT TOTAL HIP ARTHROPLASTY ANTERIOR APPROACH (Right) Patient reports pain as mild.   Patient seen in rounds with Dr. Lequita Halt.  He is doing very well on rounds and ready to go home. Patient is well, and has had no acute complaints or problems Patient is ready to go home  Objective: Vital signs in last 24 hours: Temp:  [98.3 F (36.8 C)-98.7 F (37.1 C)] 98.7 F (37.1 C) (11/19 0558) Pulse Rate:  [76-83] 76 (11/19 0558) Resp:  [16-20] 18 (11/19 1141) BP: (116-129)/(48-62) 116/62 mmHg (11/19 0558) SpO2:  [94 %-99 %] 99 % (11/19 0800)  Intake/Output from previous day:  Intake/Output Summary (Last 24 hours) at 10/30/13 1304 Last data filed at 10/30/13 0900  Gross per 24 hour  Intake    600 ml  Output   1675 ml  Net  -1075 ml    Intake/Output this shift: Total I/O In: 240 [P.O.:240] Out: -   Labs:  Recent Labs  10/29/13 0446 10/30/13 0440  HGB 11.1* 9.2*    Recent Labs  10/29/13 0446 10/30/13 0440  WBC 9.7 11.7*  RBC 3.27* 2.69*  HCT 32.8* 26.1*  PLT 208 185    Recent Labs  10/29/13 0446 10/30/13 0440 10/30/13 0635  NA 138 133*  --   K 3.7 2.7* 2.6*  CL 103 98  --   CO2 30 27  --   BUN 13 14  --   CREATININE 1.15 1.08  --   GLUCOSE 128* 133*  --   CALCIUM 8.3* 8.7  --    No results found for this basename: LABPT, INR,  in the last 72 hours  EXAM: General - Patient is Alert, Appropriate and Oriented Extremity - Neurovascular intact Sensation intact distally Dorsiflexion/Plantar flexion intact No cellulitis present Incision - clean, dry, no drainage, healing Motor Function - intact, moving foot and toes well on exam.   Assessment/Plan: 2 Days Post-Op Procedure(s) (LRB): RIGHT TOTAL HIP ARTHROPLASTY ANTERIOR APPROACH (Right) Procedure(s) (LRB): RIGHT TOTAL HIP ARTHROPLASTY ANTERIOR APPROACH (Right) Past Medical History  Diagnosis Date  . Hypertension   . Hyperlipidemia   . Coronary artery  disease     prior PTCA and stenting of the LAD with thrombus formation on the proximal edge of the stent which extended out over the LCX - on chronic Plavix.   . Thyroid disease   . Anginal pain 2008    from blood clot  . Pneumonia     at 72 years old, double  . GERD (gastroesophageal reflux disease)     occasional  . Arthritis   . Blood clot in vein 2009    "got hung on heart stent"   Principal Problem:   OA (osteoarthritis) of hip Active Problems:   CAD (coronary artery disease)   HTN (hypertension)   Hyperlipidemia   Postoperative anemia due to acute blood loss   Hypokalemia  Estimated body mass index is 26.98 kg/(m^2) as calculated from the following:   Height as of this encounter: 6' (1.829 m).   Weight as of this encounter: 90.266 kg (199 lb). Up with therapy Discharge home with home health Diet - Cardiac diet Follow up - in 2 weeks Activity - WBAT Disposition - Home Condition Upon Discharge - Good D/C Meds - See DC Summary DVT Prophylaxis - Aspirin and Plavix  PERKINS, ALEXZANDREW 10/30/2013, 1:04 PM

## 2013-10-30 NOTE — Discharge Instructions (Signed)
Dr. Gaynelle Arabian Total Joint Specialist [facility] [address], [place], [place] [address] [phone]   TOTAL HIP REPLACEMENT POSTOPERATIVE DIRECTIONS    Hip Rehabilitation, Guidelines Following Surgery  The results of a hip operation are greatly improved after range of motion and muscle strengthening exercises. Follow all safety measures which are given to protect your hip. If any of these exercises cause increased pain or swelling in your joint, decrease the amount until you are comfortable again. Then slowly increase the exercises. Call your caregiver if you have problems or questions.  HOME CARE INSTRUCTIONS  Most of the following instructions are designed to prevent the dislocation of your new hip.  Remove items at home which could result in a fall. This includes throw rugs or furniture in walking pathways.  Continue medications as instructed at time of discharge.  You may have some home medications which will be placed on hold until you complete the course of blood thinner medication.  You may start showering once you are discharged home but do not submerge the incision under water. Just pat the incision dry and apply a dry gauze dressing on daily. Do not put on socks or shoes without following the instructions of your caregivers.  Sit on high chairs so your hips are not bent more than 90 degrees.  Sit on chairs with arms. Use the chair arms to help push yourself up when arising.  Keep your leg on the side of the operation out in front of you when standing up.  Arrange for the use of a toilet seat elevator so you are not sitting low.  Do not do any exercises or get in any positions that cause your toes to point in (pigeon toed).  Always sleep with a pillow between your legs. Do not lie on your side in sleep with both knees touching the bed.   Walk with walker as instructed.  You may resume a sexual relationship in one month or when given the OK by  your caregiver.  Use walker as long as suggested by your caregivers.  You may put full weight on your legs and walk as much as is comfortable. Avoid periods of inactivity such as sitting longer than an hour when not asleep. This helps prevent blood clots.  You may return to work once you are cleared by Engineer, production.  Do not drive a car for 6 weeks or until released by your surgeon.  Do not drive while taking narcotics.  Wear elastic stockings for three weeks following surgery during the day but you may remove then at night.  Make sure you keep all of your appointments after your operation with all of your doctors and caregivers. You should call the office at the above phone number and make an appointment for approximately two weeks after the date of your surgery. Change the dressing daily and reapply a dry dressing each time. Please pick up a stool softener and laxative for home use as long as you are requiring pain medications.  Continue to use ice on the hip for pain and swelling from surgery. You may notice swelling that will progress down to the foot and ankle.  This is normal after  surgery.  Elevate the leg when you are not up walking on it.   It is important for you to complete the blood thinner medication as prescribed by your doctor.  Continue to use the breathing machine which will help keep your temperature down.  It  is common for your temperature to cycle up and down following surgery, especially at night when you are not up moving around and exerting yourself.  The breathing machine keeps your lungs expanded and your temperature down.  RANGE OF MOTION AND STRENGTHENING EXERCISES  These exercises are designed to help you keep full movement of your hip joint. Follow your caregiver's or physical therapist's instructions. Perform all exercises about fifteen times, three times per day or as directed. Exercise both hips, even if you have had only one joint replacement. These exercises can be  done on a training (exercise) mat, on the floor, on a table or on a bed. Use whatever works the best and is most comfortable for you. Use music or television while you are exercising so that the exercises are a pleasant break in your day. This will make your life better with the exercises acting as a break in routine you can look forward to.  Lying on your back, slowly slide your foot toward your buttocks, raising your knee up off the floor. Then slowly slide your foot back down until your leg is straight again.  Lying on your back spread your legs as far apart as you can without causing discomfort.  Lying on your side, raise your upper leg and foot straight up from the floor as far as is comfortable. Slowly lower the leg and repeat.  Lying on your back, tighten up the muscle in the front of your thigh (quadriceps muscles). You can do this by keeping your leg straight and trying to raise your heel off the floor. This helps strengthen the largest muscle supporting your knee.  Lying on your back, tighten up the muscles of your buttocks both with the legs straight and with the knee bent at a comfortable angle while keeping your heel on the floor.   SKILLED REHAB INSTRUCTIONS: If the patient is transferred to a skilled rehab facility following release from the hospital, a list of the current medications will be sent to the facility for the patient to continue.  When discharged from the skilled rehab facility, please have the facility set up the patient's Home Health Physical Therapy prior to being released. Also, the skilled facility will be responsible for providing the patient with their medications at time of release from the facility to include their pain medication, the muscle relaxants, and their blood thinner medication. If the patient is still at the rehab facility at time of the two week follow up appointment, the skilled rehab facility will also need to assist the patient in arranging follow up  appointment in our office and any transportation needs.  MAKE SURE YOU:  Understand these instructions.  Will watch your condition.  Will get help right away if you are not doing well or get worse.  Pick up stool softner and laxative for home. Do not submerge incision under water. May shower. Continue to use ice for pain and swelling from surgery. Hip precautions.  Total Hip Protocol.  Resume the Plavix and Aspirin at home.

## 2013-10-30 NOTE — Progress Notes (Signed)
Physical Therapy Treatment Patient Details Name: Thomas [name] MRN: [id number] DOB: 04-28-1941 Today's Date: 10/30/2013 Time: 4098-1191 PT Time Calculation (min): 36 min  PT Assessment / Plan / Recommendation  History of Present Illness     PT Comments   Ready for dc  Follow Up Recommendations  Home health PT     Does the patient have the potential to tolerate intense rehabilitation     Barriers to Discharge        Equipment Recommendations       Recommendations for Other Services    Frequency     Progress towards PT Goals Progress towards PT goals: Progressing toward goals  Plan Current plan remains appropriate    Precautions / Restrictions     Pertinent Vitals/Pain none    Mobility  Bed Mobility Supine to Sit: 6: Modified independent (Device/Increase time) Details for Bed Mobility Assistance: extra effort to get LLE off of bed but did make it. Transfers Sit to Stand: 5: Supervision;From bed Stand to Sit: To chair/3-in-1;5: Supervision Details for Transfer Assistance: cues for UE position, LE managment, min assist to rise and steady and control descent. Ambulation/Gait Ambulation/Gait Assistance: 5: Supervision Ambulation Distance (Feet): 400 Feet Assistive device: Rolling walker Ambulation/Gait Assistance Details: pt took multiple steps with barely any support of RW, Did instruct pt to use RW until HHPT transitioned to cane. Gait Pattern: Step-through pattern    Exercises Total Joint Exercises Ankle Circles/Pumps: AROM;Both;10 reps Short Arc Quad: AROM;Right;10 reps;Supine Heel Slides: AROM;Right;10 reps;Supine Hip ABduction/ADduction: AROM;Right;10 reps;Supine Long Arc Quad: AROM;Right;10 reps;Seated   PT Diagnosis:    PT Problem List:   PT Treatment Interventions:     PT Goals (current goals can now be found in the care plan section)    Visit Information  Last PT Received On: 10/30/13 Assistance Needed: +1    Subjective Data      Cognition  Cognition Arousal/Alertness: Awake/alert    Balance     End of Session PT - End of Session Activity Tolerance: Patient tolerated treatment well Patient left: in chair Nurse Communication:  (ready for DC)   GP     [name] 10/30/2013, 10:42 AM

## 2014-02-26 ENCOUNTER — Encounter: Payer: Self-pay | Admitting: Cardiovascular Disease

## 2014-02-26 ENCOUNTER — Ambulatory Visit (INDEPENDENT_AMBULATORY_CARE_PROVIDER_SITE_OTHER): Payer: Medicare Other | Admitting: Cardiovascular Disease

## 2014-02-26 ENCOUNTER — Ambulatory Visit: Payer: Medicare Other | Admitting: Cardiovascular Disease

## 2014-02-26 VITALS — BP 128/80 | HR 78 | Ht 72.0 in | Wt 198.1 lb

## 2014-02-26 DIAGNOSIS — I2510 Atherosclerotic heart disease of native coronary artery without angina pectoris: Secondary | ICD-10-CM

## 2014-02-26 DIAGNOSIS — I10 Essential (primary) hypertension: Secondary | ICD-10-CM

## 2014-02-26 NOTE — Patient Instructions (Signed)
Your physician wants you to follow-up in: 1 year  You will receive a reminder letter in the mail two months in advance. If you don't receive a letter, please call our office to schedule the follow-up appointment.   Your physician recommends that you return for a FASTING lipid profile: 1 year

## 2014-02-26 NOTE — Progress Notes (Signed)
Thomas [name] Date of Birth  12-Dec-1941 [facility]     [place] Office  [address]    [address]   [address] [place], [place]  [address]    [place], [place]  [id number] [phone]  Fax  [phone]  [phone]  Fax [phone]   History of Present Illness:  Thomas [name] is a 73 year old gentleman with a history of coronary artery disease. He is status post PTCA and stenting of his left anterior descending artery. He's had thrombus formation on the proximal edge of the stent which extended out over the left circumflex artery. He's been on Plavix since that time.  He complains of some hip pain.  He still smokes a few cigarettes a day.  February 26, 2014:  Since I last saw him 1 1/2 years ago, he has had hip surgery .    I had recommneded that he not stop his plavix.  Dr. Despina HickAlusio opperated on him ON plavix and had no significant bleeding episodes.    He has not had any CP  Current Outpatient Prescriptions on File Prior to Visit  Medication Sig Dispense Refill  . aspirin 325 MG tablet Take 325 mg by mouth daily.        . beta carotene w/minerals (OCUVITE) tablet Take 1 tablet by mouth daily.      . clopidogrel (PLAVIX) 75 MG tablet Take 75 mg by mouth daily with breakfast.      . levothyroxine (SYNTHROID, LEVOTHROID) 200 MCG tablet Take 200 mcg by mouth daily before breakfast.      . Multiple Vitamin (MULTIVITAMIN WITH MINERALS) TABS tablet Take 1 tablet by mouth daily.      . nitroGLYCERIN (NITROSTAT) 0.4 MG SL tablet Place 0.4 mg under the tongue every 5 (five) minutes as needed for chest pain.       . potassium chloride (KLOR-CON) 20 MEQ packet Take 20 mEq by mouth daily.      . simvastatin (ZOCOR) 40 MG tablet Take 40 mg by mouth every evening.      . triamterene-hydrochlorothiazide (MAXZIDE) 75-50 MG per tablet Take 0.5 tablets by mouth every morning.        No current facility-administered medications on file prior to visit.    No Known  Allergies  Past Medical History  Diagnosis Date  . Hypertension   . Hyperlipidemia   . Coronary artery disease     prior PTCA and stenting of the LAD with thrombus formation on the proximal edge of the stent which extended out over the LCX - on chronic Plavix.   . Thyroid disease   . Anginal pain 2008    from blood clot  . Pneumonia     at 73 years old, double  . GERD (gastroesophageal reflux disease)     occasional  . Arthritis   . Blood clot in vein 2009    "got hung on heart stent"    Past Surgical History  Procedure Laterality Date  . Cardiac catheterization    . Coronary angioplasty  05/21/2007    lad stent/taxus stent  . Hemorroidectomy  late 1970's  . Hernia repair Right 1980's  . 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 Current Every Day Smoker -- 0.25 packs/day for 50 years  .  Types: Cigarettes  Smokeless tobacco  . Never Used    Comment: trying to quit    History  Alcohol Use No    No family history on file.  Reviw of Systems:  Reviewed in the HPI.  All other systems are negative.  Physical Exam: BP 128/80  Pulse 78  Ht 6' (1.829 m)  Wt 198 lb 1.9 oz (89.867 kg)  BMI 26.86 kg/m2 The patient is alert and oriented x 3.  The mood and affect are normal.   Skin: warm and dry.  Color is normal.    HEENT:   Normocephalic/atraumatic. The carotids are normal. His neck is supple. He does members moist.  Lungs: Lungs are clear.   Heart: Regular S1-S2. No murmurs gallops or rubs.    Abdomen: Good bowel sounds. There is no hepatomegaly. His abdomen is nontender.  Extremities:  No clubbing cyanosis or edema.  Neuro:  Nonfocal. Gait is normal    ECG: Normal sinus rhythm. He has a right bundle branch block.  Assessment / Plan:

## 2014-02-26 NOTE — Assessment & Plan Note (Signed)
Stable

## 2014-02-26 NOTE — Assessment & Plan Note (Signed)
>>  ASSESSMENT AND PLAN FOR ESSENTIAL HYPERTENSION WRITTEN ON 02/26/2014 12:16 PM BY NAHSER, Lela Purple, MD  Stable

## 2014-02-26 NOTE — Assessment & Plan Note (Signed)
He is doing OK.  myoview in Oct. 2014 was negative for ischemia.  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     He continued his Plavix right up to the day before his hip surgery. He Dr. Despina HickAlusio did not have any significant increase in bleeding.   He's had his fasting lipids checked at his medical doctor's office. I'll see him in one year. We'll check fasting lipids and an EKG at that time.

## 2014-06-26 NOTE — Telephone Encounter (Signed)
Close encounter 

## 2014-10-14 ENCOUNTER — Other Ambulatory Visit: Payer: Self-pay | Admitting: Family Medicine

## 2014-10-14 DIAGNOSIS — Z139 Encounter for screening, unspecified: Secondary | ICD-10-CM

## 2014-10-20 ENCOUNTER — Ambulatory Visit: Payer: Medicare Other

## 2015-01-30 ENCOUNTER — Telehealth: Payer: Self-pay | Admitting: Cardiovascular Disease

## 2015-01-30 NOTE — Telephone Encounter (Signed)
Received request from Nurse fax box, documents faxed for surgical clearance. °To:  Orthopaedics °Fax number: [phone] °Attention: °2.19.16/km °

## 2015-02-25 ENCOUNTER — Telehealth: Payer: Self-pay | Admitting: Cardiovascular Disease

## 2015-02-25 NOTE — Telephone Encounter (Signed)
New Msg      Request for surgical clearance:  1. What type of surgery is being performed? Left hip surgery  2. When is this surgery scheduled? May 08, 2015  3. Are there any medications that need to be held prior to surgery and how long? Pt not sure at the moment.  4. Name of physician performing surgery? Dr. Homero FellersFrank  [facility], pt not sure of how to spell dr last name  5. What is your office phone and fax number? Pt isn't sure.     This may be discussed tomorrow at appt, no need to call back.

## 2015-02-26 ENCOUNTER — Ambulatory Visit (INDEPENDENT_AMBULATORY_CARE_PROVIDER_SITE_OTHER): Payer: Medicare Other | Admitting: Cardiovascular Disease

## 2015-02-26 ENCOUNTER — Encounter: Payer: Self-pay | Admitting: Cardiovascular Disease

## 2015-02-26 VITALS — BP 156/94 | HR 74 | Ht 72.0 in | Wt 198.8 lb

## 2015-02-26 DIAGNOSIS — E785 Hyperlipidemia, unspecified: Secondary | ICD-10-CM

## 2015-02-26 DIAGNOSIS — I2510 Atherosclerotic heart disease of native coronary artery without angina pectoris: Secondary | ICD-10-CM

## 2015-02-26 DIAGNOSIS — I10 Essential (primary) hypertension: Secondary | ICD-10-CM

## 2015-02-26 NOTE — Telephone Encounter (Signed)
Patient was evaluated by Dr. Elease HashimotoNahser today in the office.  Surgical clearance [facility].

## 2015-02-26 NOTE — Progress Notes (Signed)
Cardiology Office Note   Date:  02/26/2015   ID:  Thomas [name], DOB 02-19-1941, MRN [id number]  PCP:  Kaleen Mask, MD  Cardiologist:   Vesta Mixer, MD   Chief Complaint  Patient presents with  . Coronary Artery Disease   Problem list: 1. Coronary artery disease-has a stent in his left anterior descending artery. The proximal edge of the stent hangs out into the left main. He's had thrombus formation on the proximal edge of the stent that embolized down the left circumflex artery when he was off Plavix. Because of this we have kept him on Plavix lifelong   2. Essential hypertension 3. Hyperlipidemia 4. Osteoarthritis  Thomas [name] is a 74 year old gentleman with a history of coronary artery disease. He is status post PTCA and stenting of his left anterior descending artery. He's had thrombus formation on the proximal edge of the stent which extended out over the left circumflex artery. He's been on Plavix since that time.  He complains of some hip pain. He still smokes a few cigarettes a day.  February 26, 2014:  Since I last saw him 1 1/2 years ago, he has had hip surgery . I had recommneded that he not stop his plavix. Dr. Despina Hick opperated on him ON plavix and had no significant bleeding episodes.   He has not had any CP   February 26, 2015 Thomas [name] is a 74 y.o. male who presents for pre op visit for left hip replacement.  He had his last hip replacment WHILE on full dose Plavix and had no bleeding .  He has no CP or dyspnea.  He is limited by his hip problems.    Past Medical History  Diagnosis Date  . Hypertension   . Hyperlipidemia   . Coronary artery disease     prior PTCA and stenting of the LAD with thrombus formation on the proximal edge of the stent which extended out over the LCX - on chronic Plavix.   . Thyroid disease   . Anginal pain 2008    from blood clot  . Pneumonia     at 74 years old, double  . GERD (gastroesophageal reflux  disease)     occasional  . Arthritis   . Blood clot in vein 2009    "got hung on heart stent"    Past Surgical History  Procedure Laterality Date  . Cardiac catheterization    . Coronary angioplasty  05/21/2007    lad stent/taxus stent  . Hemorroidectomy  late 1970's  . Hernia repair Right 1980's  . 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 aspirin 325 MG tablet Take 325 mg by mouth daily.      . beta carotene w/minerals (OCUVITE) tablet Take 1 tablet by mouth daily.    . clopidogrel (PLAVIX) 75 MG tablet Take 75 mg by mouth daily with breakfast.    . levothyroxine (SYNTHROID, LEVOTHROID) 200 MCG tablet Take 200 mcg by mouth daily before breakfast.    . Multiple Vitamin (MULTIVITAMIN WITH MINERALS) TABS tablet Take 1 tablet by mouth daily.    . nitroGLYCERIN (NITROSTAT) 0.4 MG SL tablet Place 0.4 mg under the tongue every 5 (five) minutes as needed for chest pain.     . potassium chloride (  KLOR-CON) 20 MEQ packet Take 20 mEq by mouth daily.    . simvastatin (ZOCOR) 40 MG tablet Take 40 mg by mouth every evening.    . triamterene-hydrochlorothiazide (MAXZIDE) 75-50 MG per tablet Take 0.5 tablets by mouth every morning.      No current facility-administered medications for this visit.    Allergies:   Review of patient's allergies indicates no known allergies.    Social History:  The patient  reports that he has been smoking Cigarettes.  He has a 12.5 pack-year smoking history. He has never used smokeless tobacco. He reports that he does not drink alcohol or use illicit drugs.   Family History:  The patient's family history includes CAD in his mother; Heart failure in his father and mother; Pneumonia in his father.    ROS:  Please see the history of present  illness.    Review of Systems: Constitutional:  denies fever, chills, diaphoresis, appetite change and fatigue.  HEENT: denies photophobia, eye pain, redness, hearing loss, ear pain, congestion, sore throat, rhinorrhea, sneezing, neck pain, neck stiffness and tinnitus.  Respiratory: denies SOB, DOE, cough, chest tightness, and wheezing.  Cardiovascular: denies chest pain, palpitations and leg swelling.  Gastrointestinal: denies nausea, vomiting, abdominal pain, diarrhea, constipation, blood in stool.  Genitourinary: denies dysuria, urgency, frequency, hematuria, flank pain and difficulty urinating.  Musculoskeletal: denies  myalgias, back pain, joint swelling, arthralgias and gait problem.   Skin: denies pallor, rash and wound.  Neurological: denies dizziness, seizures, syncope, weakness, light-headedness, numbness and headaches.   Hematological: denies adenopathy, easy bruising, personal or family bleeding history.  Psychiatric/ Behavioral: denies suicidal ideation, mood changes, confusion, nervousness, sleep disturbance and agitation.       All other systems are reviewed and negative.    PHYSICAL EXAM: VS:  BP 156/94 mmHg  Pulse 74  Ht 6' (1.829 m)  Wt 198 lb 12.8 oz (90.175 kg)  BMI 26.96 kg/m2  SpO2 97% , BMI Body mass index is 26.96 kg/(m^2). GEN: Well nourished, well developed, in no acute distress HEENT: normal Neck: no JVD, carotid bruits, or masses Cardiac: RRR; no murmurs, rubs, or gallops,no edema  Respiratory:  clear to auscultation bilaterally, normal work of breathing GI: soft, nontender, nondistended, + BS MS: no deformity or atrophy Skin: warm and dry, no rash Neuro:  Strength and sensation are intact Psych: normal   EKG:  EKG is not ordered today. The ekg from Dr. Jeannetta NapElkins office reveals normal sinus rhythm. He has right bundle branch block.   Recent Labs: No results found for requested labs within last 365 days.    Lipid Panel    Component Value  Date/Time   CHOL 177 12/21/2011 1103   TRIG 142.0 12/21/2011 1103   HDL 39.40 12/21/2011 1103   CHOLHDL 4 12/21/2011 1103   VLDL 28.4 12/21/2011 1103   LDLCALC 109* 12/21/2011 1103      Wt Readings from Last 3 Encounters:  02/26/15 198 lb 12.8 oz (90.175 kg)  02/26/14 198 lb 1.9 oz (89.867 kg)  10/28/13 199 lb (90.266 kg)      Other studies Reviewed: Additional studies/ records that were reviewed today include: . Review of the above records demonstrates:    ASSESSMENT AND PLAN:  1. Coronary artery disease-has a stent in his left anterior descending artery. The proximal edge of the stent hangs out into the left main. He's had thrombus formation on the proximal edge of the stent that embolized down the left circumflex artery when he was  off Plavix. Because of this we have kept him on Plavix lifelong   For his last hip surgery, Dr. Despina Hick  operated on him while he was still on full dose Plavix. He did not have any excessive bleeding. My recommendation is that he have this next hip replacement on Plavix as well.    2. Essential hypertension 3. Hyperlipidemia 4. Osteoarthritis   Current medicines are reviewed at length with the patient today.  The patient does not have concerns regarding medicines.  The following changes have been made:  no change  Labs/ tests ordered today include:  No orders of the defined types were placed in this encounter.     Disposition:   FU with me in 1 year    Signed, Cowen Pesqueira, Deloris Ping, MD  02/26/2015 9:44 AM    [facility] [address] [place], [place], [place]  [address] Phone: [phone]; Fax: [phone]

## 2015-02-26 NOTE — Patient Instructions (Signed)
Your physician recommends that you continue on your current medications as directed. Please refer to the Current Medication list given to you today.  Your physician wants you to follow-up in: 1 year with Dr. Nahser.  You will receive a reminder letter in the mail two months in advance. If you don't receive a letter, please call our office to schedule the follow-up appointment. Your physician recommends that you return for lab work in: 1 year on the day of or a few days before your office visit with Dr. Nahser.  You will need to FAST for this appointment - nothing to eat or drink after midnight the night before except water.   

## 2015-04-21 ENCOUNTER — Ambulatory Visit: Payer: Self-pay | Admitting: Orthopedic Surgery

## 2015-04-21 NOTE — Progress Notes (Signed)
Preoperative surgical orders have been place into the Epic hospital system for Thomas [name] on 04/21/2015, 9:28 AM  by Patrica DuelPERKINS, ALEXZANDREW for surgery on 05-08-2015.  Preop Total Hip - Anterior Approach orders including IV Tylenol, and IV Decadron as long as there are no contraindications to the above medications. Avel Peacerew Perkins, PA-C

## 2015-04-27 ENCOUNTER — Telehealth: Payer: Self-pay | Admitting: Cardiovascular Disease

## 2015-04-27 ENCOUNTER — Encounter (HOSPITAL_COMMUNITY)
Admission: RE | Admit: 2015-04-27 | Discharge: 2015-04-27 | Disposition: A | Discharge status: Home / Self Care | Payer: Medicare Other | Source: Ambulatory Visit | Attending: Orthopedic Surgery | Admitting: Orthopedic Surgery

## 2015-04-27 ENCOUNTER — Encounter (HOSPITAL_COMMUNITY): Payer: Self-pay

## 2015-04-27 DIAGNOSIS — I2510 Atherosclerotic heart disease of native coronary artery without angina pectoris: Secondary | ICD-10-CM | POA: Insufficient documentation

## 2015-04-27 DIAGNOSIS — E785 Hyperlipidemia, unspecified: Secondary | ICD-10-CM | POA: Diagnosis not present

## 2015-04-27 DIAGNOSIS — Z955 Presence of coronary angioplasty implant and graft: Secondary | ICD-10-CM | POA: Insufficient documentation

## 2015-04-27 DIAGNOSIS — Z7982 Long term (current) use of aspirin: Secondary | ICD-10-CM | POA: Diagnosis not present

## 2015-04-27 DIAGNOSIS — E039 Hypothyroidism, unspecified: Secondary | ICD-10-CM | POA: Insufficient documentation

## 2015-04-27 DIAGNOSIS — Z01812 Encounter for preprocedural laboratory examination: Secondary | ICD-10-CM | POA: Insufficient documentation

## 2015-04-27 DIAGNOSIS — I10 Essential (primary) hypertension: Secondary | ICD-10-CM | POA: Insufficient documentation

## 2015-04-27 DIAGNOSIS — K219 Gastro-esophageal reflux disease without esophagitis: Secondary | ICD-10-CM | POA: Diagnosis not present

## 2015-04-27 DIAGNOSIS — Z86718 Personal history of other venous thrombosis and embolism: Secondary | ICD-10-CM | POA: Insufficient documentation

## 2015-04-27 DIAGNOSIS — M1612 Unilateral primary osteoarthritis, left hip: Secondary | ICD-10-CM | POA: Insufficient documentation

## 2015-04-27 DIAGNOSIS — Z01818 Encounter for other preprocedural examination: Secondary | ICD-10-CM | POA: Insufficient documentation

## 2015-04-27 DIAGNOSIS — F17210 Nicotine dependence, cigarettes, uncomplicated: Secondary | ICD-10-CM | POA: Insufficient documentation

## 2015-04-27 DIAGNOSIS — Z0183 Encounter for blood typing: Secondary | ICD-10-CM | POA: Diagnosis not present

## 2015-04-27 DIAGNOSIS — Z7902 Long term (current) use of antithrombotics/antiplatelets: Secondary | ICD-10-CM | POA: Insufficient documentation

## 2015-04-27 HISTORY — DX: Personal history of urinary calculi: Z87.442

## 2015-04-27 HISTORY — DX: Hypothyroidism, unspecified: E03.9

## 2015-04-27 HISTORY — DX: Personal history of other venous thrombosis and embolism: Z86.718

## 2015-04-27 HISTORY — DX: Effusion, unspecified joint: M25.40

## 2015-04-27 HISTORY — DX: Pain in unspecified joint: M25.50

## 2015-04-27 LAB — TYPE AND SCREEN
ABO/RH(D): O NEG
Antibody Screen: NEGATIVE

## 2015-04-27 LAB — COMPREHENSIVE METABOLIC PANEL
ALT: 24 U/L (ref 17–63)
AST: 25 U/L (ref 15–41)
Albumin: 4.2 g/dL (ref 3.5–5.0)
Alkaline Phosphatase: 75 U/L (ref 38–126)
Anion gap: 11 (ref 5–15)
BILIRUBIN TOTAL: 0.8 mg/dL (ref 0.3–1.2)
BUN: 16 mg/dL (ref 6–20)
CALCIUM: 9.7 mg/dL (ref 8.9–10.3)
CHLORIDE: 101 mmol/L (ref 101–111)
CO2: 27 mmol/L (ref 22–32)
CREATININE: 1.21 mg/dL (ref 0.61–1.24)
GFR calc Af Amer: >60 mL/min (ref >60)
GFR calc non Af Amer: 58 mL/min — ABNORMAL LOW (ref >60)
Glucose, Bld: 110 mg/dL — ABNORMAL HIGH (ref 65–99)
Potassium: 3.5 mmol/L (ref 3.5–5.1)
Sodium: 139 mmol/L (ref 135–145)
Total Protein: 6.7 g/dL (ref 6.5–8.1)

## 2015-04-27 LAB — PROTIME-INR
INR: 1.04 (ref 0.00–1.49)
PROTHROMBIN TIME: 13.7 s (ref 11.6–15.2)

## 2015-04-27 LAB — URINALYSIS, ROUTINE W REFLEX MICROSCOPIC
Bilirubin Urine: NEGATIVE
GLUCOSE, UA: NEGATIVE mg/dL
Hgb urine dipstick: NEGATIVE
KETONES UR: NEGATIVE mg/dL
LEUKOCYTES UA: NEGATIVE
NITRITE: NEGATIVE
PH: 8 (ref 5.0–8.0)
Protein, ur: NEGATIVE mg/dL
Specific Gravity, Urine: 1.018 (ref 1.005–1.030)
Urobilinogen, UA: 0.2 mg/dL (ref 0.0–1.0)

## 2015-04-27 LAB — CBC
HEMATOCRIT: 48.9 % (ref 39.0–52.0)
Hemoglobin: 16.5 g/dL (ref 13.0–17.0)
MCH: 33.4 pg (ref 26.0–34.0)
MCHC: 33.7 g/dL (ref 30.0–36.0)
MCV: 99 fL (ref 78.0–100.0)
PLATELETS: 234 10*3/uL (ref 150–400)
RBC: 4.94 MIL/uL (ref 4.22–5.81)
RDW: 13.3 % (ref 11.5–15.5)
WBC: 6.4 10*3/uL (ref 4.0–10.5)

## 2015-04-27 LAB — ABO/RH: ABO/RH(D): O NEG

## 2015-04-27 LAB — SURGICAL PCR SCREEN
MRSA, PCR: NEGATIVE
Staphylococcus aureus: NEGATIVE

## 2015-04-27 LAB — APTT: APTT: 27 s (ref 24–37)

## 2015-04-27 MED ORDER — CHLORHEXIDINE GLUCONATE 4 % EX LIQD: 60.0000 mL | Freq: Once | CUTANEOUS | Status: DC

## 2015-04-27 NOTE — Telephone Encounter (Signed)
New message    Neysa BonitoChristy calling from Short stay has one questions regarding Plavix & ASA.   Note say Do not stop medication.    1. Be verify when patient should take his medication before / after  procedure on 5/27.

## 2015-04-27 NOTE — Telephone Encounter (Signed)
Spoke with [facility] who called to verify that patient can take Plavix on the morning of surgery.  I reviewed Dr. Harvie BridgeNahser's last office visit note with Neysa BonitoChristy and advised that patient should continue Plavix; I advised that omitting the Plavix on the day of surgery would not alleviate the bleeding risk, therefore patient should continue including day of.  Neysa BonitoChristy states that this is the same understanding from Dr. Elease HashimotoNahser that the patient verbalized and states she will call the patient back to let him know his understanding is correct. Neysa BonitoChristy states they have Dr. Harvie BridgeNahser's written consent that was faxed over.

## 2015-04-27 NOTE — Progress Notes (Signed)
Anesthesia Chart Review:  Pt is 74 year old male scheduled for L total hip arthroplasty on 05/08/2015 with Dr. Lequita HaltAluisio.  PCP is Dr. Windle GuardWilson Elkins. Cardiologist is Dr. Elease HashimotoNahser, last office visit 02/26/2015, f/u in 1 year recommended. [name].   PMH includes: CAD (stent in LAD), HTN, hyperlipidemia, hypothyroidism, history of blood clots (proximal edge of LAD stent hangs out into LM and thrombus developed there and embolized down L CX when pt was off plavix, therefore pt on lifelong plavix), GERD. Current smoker. BMI 27. S/p R THA 10/28/13.   Medications include: ASA, plavix. Pt is to continue ASA and plavix peri-operatively; had R THA in 2014 while continuing plavix with no bleeding problems.   Preoperative labs reviewed.    EKG 02/04/2015: NSR. RBBB.   Nuclear stress test 09/23/2013: There is no definite scar or ischemia. Wall motion cannot be assessed because the study could not be gated. LV Ejection Fraction: Study not gated. LV Wall Motion:Study could not be gated. There were PACs and PVCs.  Cardiac cath 05/21/2007: -The left main has minor luminal irregularities. -The left anterior descending artery is a large vessel. The stent is located in the proximal aspect of the LAD and actually overlaps thecircumflex artery to a slight degree. -The stent is widely patent.The mid and distal LAD have no significantirregularities. -Left circumflex artery is a moderate-sized vessel. There is a thrombusin the origin of the left circumflex artery, but the thrombus appears tobe significantly smaller than at the previous catheterization. He hasTIMI grade 3 flow down the circumflex artery. -Significant coronary artery disease involving the left circumflex artery. He has a thrombus in the origin of the left circumflex artery that appears to be successfully resolving on medical therapy.We will anticipate that he goes home today on Plavix and aspirin.   Pt has medical clearance from Dr. Jeannetta NapElkins and cardiac  clearance from Dr. Elease HashimotoNahser (in front of paper chart).   If no changes, I anticipate pt can proceed with surgery as scheduled.   Rica Mastngela Dawanna Grauberger, FNP-BC [facility]iology Phone: [phone] 04/27/2015 4:08 PM

## 2015-04-27 NOTE — Progress Notes (Addendum)
Cardiologist is Dr.Nahser with clearance note in chart.  Denies ever having an Echo  Stress test report in epic from 2014  Heart cath reports in epic from 2004/2008  EKG in epic from 02-04-15    Medical Md is Dr.Elkins-clearance in chart

## 2015-04-27 NOTE — Pre-Procedure Instructions (Signed)
Bernie CoveyClifford H Riding  04/27/2015   Your procedure is scheduled on:  Fri, May 27 @ 11:45 AM  [facility]  at 9:45 AM.  Call this number if you have problems the morning of surgery: [phone]   Remember:   Do not eat food or drink liquids after midnight.   Take these medicines the morning of surgery with A SIP OF WATER: Synthroid(Levothyroxine)              No Goody's,BC's,Aleve,Ibuprofen,Fish Oil,or any Herbal Medications. Per Dr.Nahser DO NOT stop your Plavix or Aspirin.    Do not wear jewelry.  Do not wear lotions, powders, or colognes. You may wear deodorant.  Men may shave face and neck.  Do not bring valuables to the hospital.  [facility] is not responsible                  for any belongings or valuables.               Contacts, dentures or bridgework may not be worn into surgery.  Leave suitcase in the car. After surgery it may be brought to your room.  For patients admitted to the hospital, discharge time is determined by your                treatment team.                 Special Instructions:  [place] - Preparing for Surgery  Before surgery, you can play an important role.  Because skin is not sterile, your skin needs to be as free of germs as possible.  You can reduce the number of germs on you skin by washing with CHG (chlorahexidine gluconate) soap before surgery.  CHG is an antiseptic cleaner which kills germs and bonds with the skin to continue killing germs even after washing.  Please DO NOT use if you have an allergy to CHG or antibacterial soaps.  If your skin becomes reddened/irritated stop using the CHG and inform your nurse when you arrive at Short Stay.  Do not shave (including legs and underarms) for at least 48 hours prior to the first CHG shower.  You may shave your face.  Please follow these instructions carefully:   1.  Shower with CHG Soap the night before surgery and the                                morning of Surgery.  2.  If  you choose to wash your hair, wash your hair first as usual with your       normal shampoo.  3.  After you shampoo, rinse your hair and body thoroughly to remove the                      Shampoo.  4.  Use CHG as you would any other liquid soap.  You can apply chg directly       to the skin and wash gently with scrungie or a clean washcloth.  5.  Apply the CHG Soap to your body ONLY FROM THE NECK DOWN.        Do not use on open wounds or open sores.  Avoid contact with your eyes,       ears, mouth and genitals (private parts).  Wash genitals (private parts)       with your normal  soap.  6.  Wash thoroughly, paying special attention to the area where your surgery        will be performed.  7.  Thoroughly rinse your body with warm water from the neck down.  8.  DO NOT shower/wash with your normal soap after using and rinsing off       the CHG Soap.  9.  Pat yourself dry with a clean towel.            10.  Wear clean pajamas.            11.  Place clean sheets on your bed the night of your first shower and do not        sleep with pets.  Day of Surgery  Do not apply any lotions/deoderants the morning of surgery.  Please wear clean clothes to the hospital/surgery center.     Please read over the following fact sheets that you were given: Pain Booklet, Coughing and Deep Breathing, Blood Transfusion Information, MRSA Information and Surgical Site Infection Prevention

## 2015-04-27 NOTE — Progress Notes (Signed)
i spoke with pt to let him know that yes he does need to take his Plavix and ASA day of surgery per Dr.Nahser's nurse.Pt verbalized understanding.

## 2015-04-27 NOTE — Progress Notes (Signed)
   04/27/15 1028  OBSTRUCTIVE SLEEP APNEA  Have you ever been diagnosed with sleep apnea through a sleep study? No  Do you snore loudly (loud enough to be heard through closed doors)?  0  Do you often feel tired, fatigued, or sleepy during the daytime? 0  Has anyone observed you stop breathing during your sleep? 0  Do you have, or are you being treated for high blood pressure? 1  BMI more than 35 kg/m2? 0  Age over 73 years old? 1  Neck circumference greater than 40 cm/16 inches? 1  Gender: 1

## 2015-05-07 ENCOUNTER — Ambulatory Visit: Payer: Self-pay | Admitting: Orthopedic Surgery

## 2015-05-07 MED ORDER — CEFAZOLIN SODIUM-DEXTROSE 2-3 GM-% IV SOLR: 2.0000 g | INTRAVENOUS | Status: DC

## 2015-05-07 MED ORDER — CEFAZOLIN SODIUM-DEXTROSE 2-3 GM-% IV SOLR
2.0000 g | INTRAVENOUS | Status: AC
  Administered 2015-05-08: 2 g via INTRAVENOUS
  Filled 2015-05-07: qty 50
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
## 2015-05-07 NOTE — Progress Notes (Signed)
Pt notified of time change.To arrive at 0600.

## 2015-05-08 ENCOUNTER — Inpatient Hospital Stay (HOSPITAL_COMMUNITY): Payer: Medicare Other

## 2015-05-08 ENCOUNTER — Encounter (HOSPITAL_COMMUNITY)
Admission: RE | Disposition: A | Discharge status: Home Health | Payer: Self-pay | Source: Ambulatory Visit | Attending: Orthopedic Surgery

## 2015-05-08 ENCOUNTER — Inpatient Hospital Stay (HOSPITAL_COMMUNITY): Payer: Medicare Other | Admitting: Anesthesiology

## 2015-05-08 ENCOUNTER — Inpatient Hospital Stay (HOSPITAL_COMMUNITY)
Admission: RE | Admit: 2015-05-08 | Discharge: 2015-05-09 | DRG: 470 | Disposition: A | Discharge status: Home Health | Payer: Medicare Other | Source: Ambulatory Visit | Attending: Orthopedic Surgery | Admitting: Orthopedic Surgery

## 2015-05-08 ENCOUNTER — Inpatient Hospital Stay (HOSPITAL_COMMUNITY): Payer: Medicare Other | Admitting: Emergency Medicine

## 2015-05-08 ENCOUNTER — Encounter (HOSPITAL_COMMUNITY): Payer: Self-pay | Admitting: *Deleted

## 2015-05-08 DIAGNOSIS — I2510 Atherosclerotic heart disease of native coronary artery without angina pectoris: Secondary | ICD-10-CM | POA: Diagnosis present

## 2015-05-08 DIAGNOSIS — Z79899 Other long term (current) drug therapy: Secondary | ICD-10-CM

## 2015-05-08 DIAGNOSIS — M25552 Pain in left hip: Secondary | ICD-10-CM | POA: Diagnosis present

## 2015-05-08 DIAGNOSIS — Z96649 Presence of unspecified artificial hip joint: Secondary | ICD-10-CM

## 2015-05-08 DIAGNOSIS — H9190 Unspecified hearing loss, unspecified ear: Secondary | ICD-10-CM | POA: Diagnosis present

## 2015-05-08 DIAGNOSIS — H547 Unspecified visual loss: Secondary | ICD-10-CM | POA: Diagnosis present

## 2015-05-08 DIAGNOSIS — K219 Gastro-esophageal reflux disease without esophagitis: Secondary | ICD-10-CM | POA: Diagnosis present

## 2015-05-08 DIAGNOSIS — M1612 Unilateral primary osteoarthritis, left hip: Principal | ICD-10-CM | POA: Diagnosis present

## 2015-05-08 DIAGNOSIS — R319 Hematuria, unspecified: Secondary | ICD-10-CM | POA: Diagnosis not present

## 2015-05-08 DIAGNOSIS — Z419 Encounter for procedure for purposes other than remedying health state, unspecified: Secondary | ICD-10-CM

## 2015-05-08 DIAGNOSIS — Z8249 Family history of ischemic heart disease and other diseases of the circulatory system: Secondary | ICD-10-CM | POA: Diagnosis not present

## 2015-05-08 DIAGNOSIS — F17210 Nicotine dependence, cigarettes, uncomplicated: Secondary | ICD-10-CM | POA: Diagnosis present

## 2015-05-08 DIAGNOSIS — E785 Hyperlipidemia, unspecified: Secondary | ICD-10-CM | POA: Diagnosis present

## 2015-05-08 DIAGNOSIS — Z96641 Presence of right artificial hip joint: Secondary | ICD-10-CM | POA: Diagnosis present

## 2015-05-08 DIAGNOSIS — M169 Osteoarthritis of hip, unspecified: Secondary | ICD-10-CM | POA: Diagnosis present

## 2015-05-08 DIAGNOSIS — E039 Hypothyroidism, unspecified: Secondary | ICD-10-CM | POA: Diagnosis present

## 2015-05-08 DIAGNOSIS — Z7902 Long term (current) use of antithrombotics/antiplatelets: Secondary | ICD-10-CM | POA: Diagnosis not present

## 2015-05-08 DIAGNOSIS — Z7982 Long term (current) use of aspirin: Secondary | ICD-10-CM | POA: Diagnosis not present

## 2015-05-08 DIAGNOSIS — E780 Pure hypercholesterolemia: Secondary | ICD-10-CM | POA: Diagnosis present

## 2015-05-08 DIAGNOSIS — I10 Essential (primary) hypertension: Secondary | ICD-10-CM | POA: Diagnosis present

## 2015-05-08 HISTORY — PX: TOTAL HIP ARTHROPLASTY: SHX124

## 2015-05-08 SURGERY — ARTHROPLASTY, HIP, TOTAL, ANTERIOR APPROACH
Anesthesia: General | Site: Hip | Laterality: Left

## 2015-05-08 MED ORDER — NITROGLYCERIN 0.4 MG SL SUBL: 0.4000 mg | SUBLINGUAL_TABLET | SUBLINGUAL | Status: DC | PRN

## 2015-05-08 MED ORDER — FLEET ENEMA 7-19 GM/118ML RE ENEM: 1.0000 | ENEMA | Freq: Once | RECTAL | Status: AC | PRN

## 2015-05-08 MED ORDER — DEXAMETHASONE SODIUM PHOSPHATE 10 MG/ML IJ SOLN
10.0000 mg | Freq: Once | INTRAMUSCULAR | Status: AC
  Administered 2015-05-09: 10 mg via INTRAVENOUS
  Filled 2015-05-08: qty 1

## 2015-05-08 MED ORDER — NEOSTIGMINE METHYLSULFATE 10 MG/10ML IV SOLN
INTRAVENOUS | Status: DC | PRN
  Administered 2015-05-08: 3 mg via INTRAVENOUS

## 2015-05-08 MED ORDER — PHENOL 1.4 % MT LIQD: 1.0000 | OROMUCOSAL | Status: DC | PRN

## 2015-05-08 MED ORDER — HYDROMORPHONE HCL 1 MG/ML IJ SOLN: 0.5000 mg | INTRAMUSCULAR | Status: DC | PRN

## 2015-05-08 MED ORDER — METOCLOPRAMIDE HCL 5 MG/ML IJ SOLN: 5.0000 mg | Freq: Three times a day (TID) | INTRAMUSCULAR | Status: DC | PRN

## 2015-05-08 MED ORDER — PHENYLEPHRINE HCL 10 MG/ML IJ SOLN
10.0000 mg | INTRAVENOUS | Status: DC | PRN
  Administered 2015-05-08: 60 ug/min via INTRAVENOUS

## 2015-05-08 MED ORDER — BUPIVACAINE HCL (PF) 0.25 % IJ SOLN
INTRAMUSCULAR | Status: DC | PRN
  Administered 2015-05-08: 10 mL

## 2015-05-08 MED ORDER — HYDROMORPHONE HCL 1 MG/ML IJ SOLN: 0.2500 mg | INTRAMUSCULAR | Status: DC | PRN

## 2015-05-08 MED ORDER — CLOPIDOGREL BISULFATE 75 MG PO TABS
75.0000 mg | ORAL_TABLET | Freq: Every day | ORAL | Status: DC
  Administered 2015-05-09: 75 mg via ORAL
  Filled 2015-05-08: qty 1

## 2015-05-08 MED ORDER — ACETAMINOPHEN 10 MG/ML IV SOLN
1000.0000 mg | INTRAVENOUS | Status: DC
  Filled 2015-05-08: qty 100

## 2015-05-08 MED ORDER — LACTATED RINGERS IV SOLN
INTRAVENOUS | Status: DC
  Administered 2015-05-08 (×2): via INTRAVENOUS

## 2015-05-08 MED ORDER — FENTANYL CITRATE (PF) 100 MCG/2ML IJ SOLN
INTRAMUSCULAR | Status: DC | PRN
  Administered 2015-05-08: 100 ug via INTRAVENOUS
  Administered 2015-05-08: 150 ug via INTRAVENOUS

## 2015-05-08 MED ORDER — ONDANSETRON HCL 4 MG PO TABS: 4.0000 mg | ORAL_TABLET | Freq: Four times a day (QID) | ORAL | Status: DC | PRN

## 2015-05-08 MED ORDER — GLYCOPYRROLATE 0.2 MG/ML IJ SOLN
INTRAMUSCULAR | Status: AC
  Filled 2015-05-08: qty 3

## 2015-05-08 MED ORDER — DIPHENHYDRAMINE HCL 12.5 MG/5ML PO ELIX: 12.5000 mg | ORAL_SOLUTION | ORAL | Status: DC | PRN

## 2015-05-08 MED ORDER — LEVOTHYROXINE SODIUM 200 MCG PO TABS
200.0000 ug | ORAL_TABLET | Freq: Every day | ORAL | Status: DC
  Administered 2015-05-09: 200 ug via ORAL
  Filled 2015-05-08: qty 1

## 2015-05-08 MED ORDER — TRIAMTERENE-HCTZ 75-50 MG PO TABS
0.5000 | ORAL_TABLET | Freq: Every morning | ORAL | Status: DC
  Administered 2015-05-09: 0.5 via ORAL
  Filled 2015-05-08: qty 0.5

## 2015-05-08 MED ORDER — ACETAMINOPHEN 10 MG/ML IV SOLN
1000.0000 mg | Freq: Once | INTRAVENOUS | Status: AC
  Administered 2015-05-08: 1000 mg via INTRAVENOUS

## 2015-05-08 MED ORDER — PROPOFOL 10 MG/ML IV BOLUS
INTRAVENOUS | Status: AC
  Filled 2015-05-08: qty 20

## 2015-05-08 MED ORDER — MIDAZOLAM HCL 2 MG/2ML IJ SOLN
INTRAMUSCULAR | Status: AC
  Filled 2015-05-08: qty 2

## 2015-05-08 MED ORDER — ONDANSETRON HCL 4 MG/2ML IJ SOLN
INTRAMUSCULAR | Status: DC | PRN
  Administered 2015-05-08: 4 mg via INTRAVENOUS

## 2015-05-08 MED ORDER — OXYCODONE HCL 5 MG PO TABS: 5.0000 mg | ORAL_TABLET | ORAL | Status: DC | PRN

## 2015-05-08 MED ORDER — BUPIVACAINE HCL (PF) 0.25 % IJ SOLN
INTRAMUSCULAR | Status: AC
  Filled 2015-05-08: qty 30

## 2015-05-08 MED ORDER — NEOSTIGMINE METHYLSULFATE 10 MG/10ML IV SOLN
INTRAVENOUS | Status: AC
  Filled 2015-05-08: qty 1

## 2015-05-08 MED ORDER — CYCLOBENZAPRINE HCL 10 MG PO TABS: 10.0000 mg | ORAL_TABLET | Freq: Three times a day (TID) | ORAL | Status: DC | PRN

## 2015-05-08 MED ORDER — MIDAZOLAM HCL 5 MG/5ML IJ SOLN
INTRAMUSCULAR | Status: DC | PRN
  Administered 2015-05-08: 1.5 mg via INTRAVENOUS
  Administered 2015-05-08: 0.5 mg via INTRAVENOUS

## 2015-05-08 MED ORDER — HYDROMORPHONE HCL 2 MG PO TABS: 2.0000 mg | ORAL_TABLET | ORAL | Status: DC | PRN

## 2015-05-08 MED ORDER — SODIUM CHLORIDE 0.9 % IV SOLN
INTRAVENOUS | Status: DC
  Administered 2015-05-09: 04:00:00 via INTRAVENOUS

## 2015-05-08 MED ORDER — ONDANSETRON HCL 4 MG/2ML IJ SOLN
INTRAMUSCULAR | Status: AC
  Filled 2015-05-08: qty 2

## 2015-05-08 MED ORDER — MENTHOL 3 MG MT LOZG: 1.0000 | LOZENGE | OROMUCOSAL | Status: DC | PRN

## 2015-05-08 MED ORDER — CEFAZOLIN SODIUM-DEXTROSE 2-3 GM-% IV SOLR
2.0000 g | Freq: Four times a day (QID) | INTRAVENOUS | Status: AC
  Administered 2015-05-08 – 2015-05-09 (×2): 2 g via INTRAVENOUS
  Filled 2015-05-08 (×2): qty 50

## 2015-05-08 MED ORDER — SODIUM CHLORIDE 0.9 % IV SOLN: INTRAVENOUS | Status: DC

## 2015-05-08 MED ORDER — PROMETHAZINE HCL 25 MG/ML IJ SOLN: 6.2500 mg | INTRAMUSCULAR | Status: DC | PRN

## 2015-05-08 MED ORDER — DEXAMETHASONE SODIUM PHOSPHATE 4 MG/ML IJ SOLN
INTRAMUSCULAR | Status: AC
  Filled 2015-05-08: qty 1

## 2015-05-08 MED ORDER — ONDANSETRON HCL 4 MG/2ML IJ SOLN: 4.0000 mg | Freq: Four times a day (QID) | INTRAMUSCULAR | Status: DC | PRN

## 2015-05-08 MED ORDER — METOCLOPRAMIDE HCL 5 MG PO TABS: 5.0000 mg | ORAL_TABLET | Freq: Three times a day (TID) | ORAL | Status: DC | PRN

## 2015-05-08 MED ORDER — ACETAMINOPHEN 325 MG PO TABS: 650.0000 mg | ORAL_TABLET | Freq: Four times a day (QID) | ORAL | Status: DC | PRN

## 2015-05-08 MED ORDER — POTASSIUM CHLORIDE CRYS ER 20 MEQ PO TBCR
20.0000 meq | EXTENDED_RELEASE_TABLET | Freq: Every day | ORAL | Status: DC
  Administered 2015-05-09: 20 meq via ORAL
  Filled 2015-05-08: qty 1

## 2015-05-08 MED ORDER — DOCUSATE SODIUM 100 MG PO CAPS
100.0000 mg | ORAL_CAPSULE | Freq: Two times a day (BID) | ORAL | Status: DC
  Administered 2015-05-08 – 2015-05-09 (×2): 100 mg via ORAL
  Filled 2015-05-08 (×2): qty 1

## 2015-05-08 MED ORDER — ROCURONIUM BROMIDE 50 MG/5ML IV SOLN
INTRAVENOUS | Status: AC
  Filled 2015-05-08: qty 1

## 2015-05-08 MED ORDER — ACETAMINOPHEN 650 MG RE SUPP: 650.0000 mg | Freq: Four times a day (QID) | RECTAL | Status: DC | PRN

## 2015-05-08 MED ORDER — DEXTROSE 5 % IV SOLN
INTRAVENOUS | Status: DC | PRN
  Administered 2015-05-08: 11:00:00 via INTRAVENOUS

## 2015-05-08 MED ORDER — GLYCOPYRROLATE 0.2 MG/ML IJ SOLN
INTRAMUSCULAR | Status: DC | PRN
  Administered 2015-05-08: .15 mg via INTRAVENOUS
  Administered 2015-05-08: 0.6 mg via INTRAVENOUS

## 2015-05-08 MED ORDER — ASPIRIN 325 MG PO TABS
325.0000 mg | ORAL_TABLET | Freq: Every day | ORAL | Status: DC
  Administered 2015-05-09: 325 mg via ORAL
  Filled 2015-05-08: qty 1

## 2015-05-08 MED ORDER — PROPOFOL 10 MG/ML IV BOLUS
INTRAVENOUS | Status: DC | PRN
  Administered 2015-05-08: 170 mg via INTRAVENOUS

## 2015-05-08 MED ORDER — POLYETHYLENE GLYCOL 3350 17 G PO PACK: 17.0000 g | PACK | Freq: Every day | ORAL | Status: DC | PRN

## 2015-05-08 MED ORDER — SIMVASTATIN 40 MG PO TABS
40.0000 mg | ORAL_TABLET | Freq: Every evening | ORAL | Status: DC
  Administered 2015-05-08: 40 mg via ORAL
  Filled 2015-05-08: qty 1

## 2015-05-08 MED ORDER — BISACODYL 10 MG RE SUPP: 10.0000 mg | Freq: Every day | RECTAL | Status: DC | PRN

## 2015-05-08 MED ORDER — DEXAMETHASONE SODIUM PHOSPHATE 10 MG/ML IJ SOLN
10.0000 mg | Freq: Once | INTRAMUSCULAR | Status: AC
  Administered 2015-05-08: 10 mg via INTRAVENOUS

## 2015-05-08 MED ORDER — MUPIROCIN 2 % EX OINT
TOPICAL_OINTMENT | CUTANEOUS | Status: AC
  Filled 2015-05-08: qty 22

## 2015-05-08 MED ORDER — FENTANYL CITRATE (PF) 250 MCG/5ML IJ SOLN
INTRAMUSCULAR | Status: AC
  Filled 2015-05-08: qty 5

## 2015-05-08 MED ORDER — ROCURONIUM BROMIDE 100 MG/10ML IV SOLN
INTRAVENOUS | Status: DC | PRN
  Administered 2015-05-08: 50 mg via INTRAVENOUS

## 2015-05-08 SURGICAL SUPPLY — 42 items
BLADE SAW SGTL 18X1.27X75 (BLADE) ×2 IMPLANT
BLADE SAW SGTL 18X1.27X75MM (BLADE) ×1
CAPT HIP TOTAL 2 ×2 IMPLANT
CLOSURE STERI-STRIP 1/2X4 (GAUZE/BANDAGES/DRESSINGS) ×1
CLSR STERI-STRIP ANTIMIC 1/2X4 (GAUZE/BANDAGES/DRESSINGS) ×3 IMPLANT
DECANTER SPIKE VIAL GLASS SM (MISCELLANEOUS) ×3 IMPLANT
DRAPE C-ARM 42X72 X-RAY (DRAPES) ×3 IMPLANT
DRAPE IMP U-DRAPE 54X76 (DRAPES) ×3 IMPLANT
DRAPE STERI IOBAN 125X83 (DRAPES) ×3 IMPLANT
DRAPE U-SHAPE 47X51 STRL (DRAPES) ×9 IMPLANT
DRSG MEPILEX BORDER 4X4 (GAUZE/BANDAGES/DRESSINGS) IMPLANT
DRSG MEPILEX BORDER 4X8 (GAUZE/BANDAGES/DRESSINGS) ×3 IMPLANT
DURAPREP 26ML APPLICATOR (WOUND CARE) ×3 IMPLANT
ELECT BLADE 6.5 EXT (BLADE) ×3 IMPLANT
ELECT REM PT RETURN 9FT ADLT (ELECTROSURGICAL) ×3
ELECTRODE REM PT RTRN 9FT ADLT (ELECTROSURGICAL) ×1 IMPLANT
EVACUATOR 1/8 PVC DRAIN (DRAIN) ×3 IMPLANT
FACESHIELD WRAPAROUND (MASK) ×6 IMPLANT
FACESHIELD WRAPAROUND OR TEAM (MASK) ×2 IMPLANT
GLOVE BIO SURGEON STRL SZ7.5 (GLOVE) ×3 IMPLANT
GLOVE BIO SURGEON STRL SZ8 (GLOVE) ×3 IMPLANT
GLOVE BIOGEL PI IND STRL 8 (GLOVE) ×2 IMPLANT
GLOVE BIOGEL PI INDICATOR 8 (GLOVE) ×4
GOWN STRL REUS W/ TWL LRG LVL3 (GOWN DISPOSABLE) ×1 IMPLANT
GOWN STRL REUS W/ TWL XL LVL3 (GOWN DISPOSABLE) ×1 IMPLANT
GOWN STRL REUS W/TWL LRG LVL3 (GOWN DISPOSABLE) ×3
GOWN STRL REUS W/TWL XL LVL3 (GOWN DISPOSABLE) ×3
KIT BASIN OR (CUSTOM PROCEDURE TRAY) ×3 IMPLANT
NDL SAFETY ECLIPSE 18X1.5 (NEEDLE) ×1 IMPLANT
NEEDLE HYPO 18GX1.5 SHARP (NEEDLE) ×3
PACK TOTAL JOINT (CUSTOM PROCEDURE TRAY) ×3 IMPLANT
PACK UNIVERSAL I (CUSTOM PROCEDURE TRAY) ×3 IMPLANT
SUT ETHIBOND NAB CT1 #1 30IN (SUTURE) ×3 IMPLANT
SUT MNCRL AB 4-0 PS2 18 (SUTURE) ×3 IMPLANT
SUT VIC AB 1 CT1 27 (SUTURE) ×3
SUT VIC AB 1 CT1 27XBRD ANTBC (SUTURE) ×1 IMPLANT
SUT VIC AB 2-0 CT1 27 (SUTURE) ×6
SUT VIC AB 2-0 CT1 TAPERPNT 27 (SUTURE) ×2 IMPLANT
SUT VLOC 180 0 24IN GS25 (SUTURE) ×3 IMPLANT
SYR CONTROL 10ML LL (SYRINGE) ×3 IMPLANT
TOWEL OR 17X26 10 PK STRL BLUE (TOWEL DISPOSABLE) ×6 IMPLANT
TRAY FOLEY CATH 16FR SILVER (SET/KITS/TRAYS/PACK) ×3 IMPLANT

## 2015-05-08 NOTE — Anesthesia Preprocedure Evaluation (Addendum)
Anesthesia Evaluation  Patient identified by MRN, date of birth, ID band Patient awake    Reviewed: Allergy & Precautions, H&P , NPO status , Patient's Chart, lab work & pertinent test results  Airway Mallampati: II  TM Distance: >3 FB Neck ROM: full    Dental no notable dental hx. (+) Teeth Intact, Dental Advisory Given, Chipped   Pulmonary neg pulmonary ROS, Current Smoker,  breath sounds clear to auscultation  Pulmonary exam normal       Cardiovascular hypertension, Pt. on medications + CAD and + Cardiac Stents Normal cardiovascular examRhythm:regular Rate:Normal  RBBB.  History clot around stent causing CP   Neuro/Psych negative neurological ROS  negative psych ROS   GI/Hepatic negative GI ROS, Neg liver ROS, GERD-  ,  Endo/Other  negative endocrine ROS  Renal/GU negative Renal ROS  negative genitourinary   Musculoskeletal   Abdominal   Peds  Hematology negative hematology ROS (+)   Anesthesia Other Findings Very jagged upper front teeth  Reproductive/Obstetrics negative OB ROS                            Anesthesia Physical  Anesthesia Plan  ASA: III  Anesthesia Plan: General   Post-op Pain Management:    Induction: Intravenous  Airway Management Planned: Oral ETT  Additional Equipment:   Intra-op Plan:   Post-operative Plan: Extubation in OR  Informed Consent: I have reviewed the patients History and Physical, chart, labs and discussed the procedure including the risks, benefits and alternatives for the proposed anesthesia with the patient or authorized representative who has indicated his/her understanding and acceptance.   Dental Advisory Given  Plan Discussed with: CRNA and Surgeon  Anesthesia Plan Comments:         Anesthesia Quick Evaluation

## 2015-05-08 NOTE — Discharge Instructions (Signed)
Dr. Ollen GrossFrank Oluwadamilola Rosamond Total Joint Specialist [facility] [address] [place], [place] [address] [phone]    ANTERIOR APPROACH TOTAL HIP REPLACEMENT POSTOPERATIVE DIRECTIONS   Hip Rehabilitation, Guidelines Following Surgery  The results of a hip operation are greatly improved after range of motion and muscle strengthening exercises. Follow all safety measures which are given to protect your hip. If any of these exercises cause increased pain or swelling in your joint, decrease the amount until you are comfortable again. Then slowly increase the exercises. Call your caregiver if you have problems or questions.  HOME CARE INSTRUCTIONS  Most of the following instructions are designed to prevent the dislocation of your new hip.  Remove items at home which could result in a fall. This includes throw rugs or furniture in walking pathways.  Continue medications as instructed at time of discharge.  You may have some home medications which will be placed on hold until you complete the course of blood thinner medication.  You may start showering on 5/30 but do not submerge the incision under water. Just pat the incision dry and apply a dry gauze dressing on daily. Do not put on socks or shoes without following the instructions of your caregivers.  Sit on high chairs which makes it easier to stand.  Sit on chairs with arms. Use the chair arms to help push yourself up when arising.  Keep your leg on the side of the operation out in front of you when standing up.  Arrange for the use of a toilet seat elevator so you are not sitting low.    Walk with walker as instructed.  You may resume a sexual relationship in one month or when given the OK by your caregiver.  Use walker as long as suggested by your caregivers.  Other:  Weight bearing as tolerated with walker. Progress to cane under direction of your therapist Avoid periods of inactivity such as sitting longer than an  hour when not asleep. This helps prevent blood clots.  You may return to work once you are cleared by Designer, industrial/productyour surgeon.  Do not drive a car for 6 weeks or until released by your surgeon.  Do not drive while taking narcotics.  Wear elastic stockings for three weeks following surgery during the day but you may remove then at night.  Make sure you keep all of your appointments after your operation with all of your doctors and caregivers. You should call the office at the above phone number and make an appointment for approximately two weeks after the date of your surgery. Change the dressing daily and reapply a dry dressing each time. Please pick up a stool softener and laxative for home use as long as you are requiring pain medications.  ICE to the affected hip every three hours for 30 minutes at a time and then as needed for pain and swelling.  Continue to use ice on the hip for pain and swelling from surgery. You may notice swelling that will progress down to the foot and ankle.  This is normal after surgery.  Elevate the leg when you are not up walking on it.   It is important for you to complete the blood thinner medication as prescribed by your doctor.  Continue to use the breathing machine which will help keep your temperature down.  It is common for your temperature to cycle up and down following surgery, especially at night when you are not up moving around and exerting  yourself.  The breathing machine keeps your lungs expanded and your temperature down.  RANGE OF MOTION AND STRENGTHENING EXERCISES  These exercises are designed to help you keep full movement of your hip joint. Follow your caregiver's or physical therapist's instructions. Perform all exercises about fifteen times, three times per day or as directed. Exercise both hips, even if you have had only one joint replacement. These exercises can be done on a training (exercise) mat, on the floor, on a table or on a bed. Use whatever works the  best and is most comfortable for you. Use music or television while you are exercising so that the exercises are a pleasant break in your day. This will make your life better with the exercises acting as a break in routine you can look forward to.  Lying on your back, slowly slide your foot toward your buttocks, raising your knee up off the floor. Then slowly slide your foot back down until your leg is straight again.  Lying on your back spread your legs as far apart as you can without causing discomfort.  Lying on your side, raise your upper leg and foot straight up from the floor as far as is comfortable. Slowly lower the leg and repeat.  Lying on your back, tighten up the muscle in the front of your thigh (quadriceps muscles). You can do this by keeping your leg straight and trying to raise your heel off the floor. This helps strengthen the largest muscle supporting your knee.  Lying on your back, tighten up the muscles of your buttocks both with the legs straight and with the knee bent at a comfortable angle while keeping your heel on the floor.   SKILLED REHAB INSTRUCTIONS: If the patient is transferred to a skilled rehab facility following release from the hospital, a list of the current medications will be sent to the facility for the patient to continue.  When discharged from the skilled rehab facility, please have the facility set up the patient's Home Health Physical Therapy prior to being released. Also, the skilled facility will be responsible for providing the patient with their medications at time of release from the facility to include their pain medication, the muscle relaxants, and their blood thinner medication. If the patient is still at the rehab facility at time of the two week follow up appointment, the skilled rehab facility will also need to assist the patient in arranging follow up appointment in our office and any transportation needs.  MAKE SURE YOU:  Understand these  instructions.  Will watch your condition.  Will get help right away if you are not doing well or get worse.  Pick up stool softner and laxative for home use following surgery while on pain medications. Do not submerge incision under water. Please use good hand washing techniques while changing dressing each day. May shower starting three days after surgery. Please use a clean towel to pat the incision dry following showers. Continue to use ice for pain and swelling after surgery. Do not use any lotions or creams on the incision until instructed by your surgeon. Total Hip Protocol.

## 2015-05-08 NOTE — Op Note (Signed)
OPERATIVE REPORT  PREOPERATIVE DIAGNOSIS: Osteoarthritis of the Left hip.   POSTOPERATIVE DIAGNOSIS: Osteoarthritis of the Left  hip.   PROCEDURE: Left total hip arthroplasty, anterior approach.   SURGEON: Thomas Gross, MD   ASSISTANT: Avel Peace, PA-C  ANESTHESIA:  General  ESTIMATED BLOOD LOSS:-300 ml  DRAINS: Hemovac x1.   COMPLICATIONS: None   CONDITION: PACU - hemodynamically stable.   BRIEF CLINICAL NOTE: Thomas [name] is a 74 y.o. male who has advanced end-  stage arthritis of his Left  hip with progressively worsening pain and  dysfunction.The patient has failed nonoperative management and presents for  total hip arthroplasty.   PROCEDURE IN DETAIL: After successful administration of spinal  anesthetic, the traction boots for the [facility] bed were placed on both  feet and the patient was placed onto the [facility] bed, boots placed into the leg  holders. The Left hip was then isolated from the perineum with plastic  drapes and prepped and draped in the usual sterile fashion. ASIS and  greater trochanter were marked and a oblique incision was made, starting  at about 1 cm lateral and 2 cm distal to the ASIS and coursing towards  the anterior cortex of the femur. The skin was cut with a 10 blade  through subcutaneous tissue to the level of the fascia overlying the  tensor fascia lata muscle. The fascia was then incised in line with the  incision at the junction of the anterior third and posterior 2/3rd. The  muscle was teased off the fascia and then the interval between the TFL  and the rectus was developed. The Hohmann retractor was then placed at  the top of the femoral neck over the capsule. The vessels overlying the  capsule were cauterized and the fat on top of the capsule was removed.  A Hohmann retractor was then placed anterior underneath the rectus  femoris to give exposure to the entire anterior capsule. A T-shaped  capsulotomy was performed. The  edges were tagged and the femoral head  was identified.       Osteophytes are removed off the superior acetabulum.  The femoral neck was then cut in situ with an oscillating saw. Traction  was then applied to the left lower extremity utilizing the [facility]  traction. The femoral head was then removed. Retractors were placed  around the acetabulum and then circumferential removal of the labrum was  performed. Osteophytes were also removed. Reaming starts at 47 mm to  medialize and  Increased in 2 mm increments to 51 mm. We reamed in  approximately 40 degrees of abduction, 20 degrees anteversion. A 52 mm  pinnacle acetabular shell was then impacted in anatomic position under  fluoroscopic guidance with excellent purchase. We did not need to place  any additional dome screws. A 36 mm neutral + 4 marathon liner was then  placed into the acetabular shell.       The femoral lift was then placed along the lateral aspect of the femur  just distal to the vastus ridge. The leg was  externally rotated and capsule  was stripped off the inferior aspect of the femoral neck down to the  level of the lesser trochanter, this was done with electrocautery. The femur was lifted after this was performed. The  leg was then placed and extended in adducted position to essentially delivering the femur. We also removed the capsule superiorly and the  piriformis from the piriformis fossa  to gain excellent exposure of the  proximal femur. Rongeur was used to remove some cancellous bone to get  into the lateral portion of the proximal femur for placement of the  initial starter reamer. The starter broaches was placed  the starter broach  and was shown to go down the center of the canal. Broaching  with the  Corail system was then performed starting at size 8, coursing  Up to size 14. A size 14 had excellent torsional and rotational  and axial stability. The trial standard offset neck was then placed  with a 36 + 5 trial  head. The hip was then reduced. We confirmed that  the stem was in the canal both on AP and lateral x-rays. It also has excellent sizing. The hip was reduced with outstanding stability through full extension, full external rotation,  and then flexion in adduction internal rotation. AP pelvis was taken  and the leg lengths were measured and found to be exactly equal. Hip  was then dislocated again and the femoral head and neck removed. The  femoral broach was removed. Size 14 Corail stem with a standard offset  neck was then impacted into the femur following native anteversion. Has  excellent purchase in the canal. Excellent torsional and rotational and  axial stability. It is confirmed to be in the canal on AP and lateral  fluoroscopic views. The 36 + 5 ceramic head was placed and the hip  reduced with outstanding stability. Again AP pelvis was taken and it  confirmed that the leg lengths were equal. The wound was then copiously  irrigated with saline solution and the capsule reattached and repaired  with Ethibond suture.  20 mL of Exparel mixed with 50 mL of saline then additional 20 ml of .25% Bupivicaine injected into the capsule and into the edge of the tensor fascia lata as well as subcutaneous tissue. The fascia overlying the tensor fascia lata was  then closed with a running #1 V-Loc. Subcu was closed with interrupted  2-0 Vicryl and subcuticular running 4-0 Monocryl. Incision was cleaned  and dried. Steri-Strips and a bulky sterile dressing applied. Hemovac  drain was hooked to suction and then he was awakened and transported to  recovery in stable condition.        Please note that a surgical assistant was a medical necessity for this procedure to perform it in a safe and expeditious manner. Assistant was necessary to provide appropriate retraction of vital neurovascular structures and to prevent femoral fracture and allow for anatomic placement of the prosthesis.  Thomas [name], M.D.

## 2015-05-08 NOTE — Transfer of Care (Signed)
Immediate Anesthesia Transfer of Care Note  Patient: Thomas [name]  Procedure(s) Performed: Procedure(s): LEFT TOTAL HIP ARTHROPLASTY ANTERIOR APPROACH (Left)  Patient Location: PACU  Anesthesia Type:General  Level of Consciousness: awake, alert , oriented and patient cooperative  Airway & Oxygen Therapy: Patient Spontanous Breathing and Patient connected to nasal cannula oxygen  Post-op Assessment: Report given to RN, Post -op Vital signs reviewed and stable and Patient moving all extremities  Post vital signs: Reviewed and stable  Last Vitals:  Filed Vitals:   05/08/15 0909  BP: 166/72  Pulse: 61  Temp: 36.8 C  Resp: 20    Complications: No apparent anesthesia complications

## 2015-05-08 NOTE — Anesthesia Postprocedure Evaluation (Signed)
  Anesthesia Post-op Note  Patient: Thomas [name]  Procedure(s) Performed: Procedure(s): LEFT TOTAL HIP ARTHROPLASTY ANTERIOR APPROACH (Left)  Patient Location: PACU  Anesthesia Type:General  Level of Consciousness: awake, alert  and oriented  Airway and Oxygen Therapy: Patient Spontanous Breathing  Post-op Pain: mild  Post-op Assessment: Post-op Vital signs reviewed  Post-op Vital Signs: Reviewed  Last Vitals:  Filed Vitals:   05/08/15 1432  BP: 123/62  Pulse: 68  Temp: 36.4 C  Resp: 17    Complications: No apparent anesthesia complications

## 2015-05-08 NOTE — Interval H&P Note (Signed)
History and Physical Interval Note:  05/08/2015 9:15 AM  Thomas [name]  has presented today for surgery, with the diagnosis of OA OF LEFT HIP  The various methods of treatment have been discussed with the patient and family. After consideration of risks, benefits and other options for treatment, the patient has consented to  Procedure(s): LEFT TOTAL HIP ARTHROPLASTY ANTERIOR APPROACH (Left) as a surgical intervention .  The patient's history has been reviewed, patient examined, no change in status, stable for surgery.  I have reviewed the patient's chart and labs.  Questions were answered to the patient's satisfaction.     Loanne DrillingALUISIO,Carey Lafon V
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
## 2015-05-08 NOTE — Anesthesia Procedure Notes (Signed)
Procedure Name: Intubation Date/Time: 05/08/2015 11:12 AM Performed by: Darcey NoraJAMES, Ani Deoliveira B Pre-anesthesia Checklist: Patient identified, Emergency Drugs available, Suction available and Patient being monitored Patient Re-evaluated:Patient Re-evaluated prior to inductionOxygen Delivery Method: Circle system utilized Preoxygenation: Pre-oxygenation with 100% oxygen Intubation Type: IV induction Ventilation: Mask ventilation without difficulty Laryngoscope Size: Mac and 4 Grade View: Grade II Tube type: Oral Tube size: 7.5 mm Number of attempts: 1 Airway Equipment and Method: Stylet Placement Confirmation: ETT inserted through vocal cords under direct vision,  breath sounds checked- equal and bilateral and positive ETCO2 Secured at: 22 (cm at teeth) cm Tube secured with: Tape Dental Injury: Injury to lip  Comments: Small pinch to left upper lip

## 2015-05-08 NOTE — Evaluation (Signed)
Physical Therapy Evaluation Patient Details Name: Thomas [name] MRN: [id number] DOB: [birth year] Today's Date: 05/08/2015   History of Present Illness  Patient is a 74 y/o male s/p L THA, direct anterior approach. PMH includes HTN, PNA, CAD, hypothyroidism and R THA in 2014.  Clinical Impression  Patient presents with pain and post surgical deficits LLE s/p above surgery impacting mobility. Slightly impulsive during ambulation and mobility requiring cues for safety. Reviewed exercises to perform while in hospital. Will attempt stair negotiation next session as tolerated to prepare pt for home. Would benefit from skilled PT to maximize independence and mobility prior to return home.    Follow Up Recommendations Home health PT;Supervision/Assistance - 24 hour    Equipment Recommendations  None recommended by PT    Recommendations for Other Services       Precautions / Restrictions Precautions Precautions: Fall Precaution Comments: direct anterior approach - no precautions Restrictions Weight Bearing Restrictions: Yes LLE Weight Bearing: Weight bearing as tolerated      Mobility  Bed Mobility Overal bed mobility: Needs Assistance Bed Mobility: Supine to Sit     Supine to sit: Supervision;HOB elevated     General bed mobility comments: Able to bring LLE to EOB without assist.   Transfers Overall transfer level: Needs assistance Equipment used: Rolling walker (2 wheeled) Transfers: Sit to/from Stand Sit to Stand: Min guard;From elevated surface         General transfer comment: Min guard for safety. Slightly impulsive.   Ambulation/Gait Ambulation/Gait assistance: Min guard Ambulation Distance (Feet): 150 Feet Assistive device: Rolling walker (2 wheeled) Gait Pattern/deviations: Step-through pattern;Decreased stride length   Gait velocity interpretation: Below normal speed for age/gender General Gait Details: Cues for RW management. Good demo of step through  gait.   Stairs            Wheelchair Mobility    Modified Rankin (Stroke Patients Only)       Balance Overall balance assessment: Needs assistance Sitting-balance support: Feet supported;No upper extremity supported Sitting balance-Leahy Scale: Good     Standing balance support: During functional activity Standing balance-Leahy Scale: Fair                               Pertinent Vitals/Pain Pain Assessment: 0-10 Pain Score: 1  Pain Location: left hip Pain Descriptors / Indicators: Throbbing Pain Intervention(s): Monitored during session;Repositioned;Ice applied    Home Living Family/patient expects to be discharged to:: Private residence Living Arrangements: Spouse/significant other Available Help at Discharge: Family;Available 24 hours/day Type of Home: House Home Access: Stairs to enter Entrance Stairs-Rails: Right;Left;Can reach both Entrance Stairs-Number of Steps: 3 Home Layout: One level Home Equipment: Walker - 2 wheels;Bedside commode;Shower seat;Cane - single point      Prior Function Level of Independence: Independent with assistive device(s)               Hand Dominance        Extremity/Trunk Assessment   Upper Extremity Assessment: Defer to OT evaluation           Lower Extremity Assessment: LLE deficits/detail   LLE Deficits / Details: Limited AROM hip flexion secondary to pain and recent surgery.     Communication   Communication: No difficulties  Cognition Arousal/Alertness: Awake/alert Behavior During Therapy: WFL for tasks assessed/performed Overall Cognitive Status: Within Functional Limits for tasks assessed  General Comments General comments (skin integrity, edema, etc.): Family present in room during session.    Exercises Total Joint Exercises Ankle Circles/Pumps: Both;15 reps;Seated Quad Sets: Both;10 reps;Seated Gluteal Sets: Both;10 reps;Seated Hip  ABduction/ADduction: Left;5 reps;Seated Long Arc Quad: Left;5 reps;Seated      Assessment/Plan    PT Assessment Patient needs continued PT services  PT Diagnosis Acute pain   PT Problem List Pain;Decreased strength;Decreased range of motion;Decreased balance;Decreased safety awareness  PT Treatment Interventions Balance training;Gait training;Functional mobility training;Therapeutic activities;Therapeutic exercise;Patient/family education;Stair training   PT Goals (Current goals can be found in the Care Plan section) Acute Rehab PT Goals Patient Stated Goal: to get up and walk independently. PT Goal Formulation: With patient Time For Goal Achievement: 05/22/15 Potential to Achieve Goals: Good    Frequency 7X/week   Barriers to discharge Inaccessible home environment 3 steps to enter to get into home with Bil handrails.    Co-evaluation               End of Session Equipment Utilized During Treatment: Gait belt Activity Tolerance: Patient tolerated treatment well Patient left: in chair;with call bell/phone within reach;with family/visitor present Nurse Communication: Mobility status         Time: 1610-9604 PT Time Calculation (min) (ACUTE ONLY): 20 min   Charges:   PT Evaluation $Initial PT Evaluation Tier I: 1 Procedure     PT G Codes:        Thomas [name] 05/08/2015, 4:28 PM Thomas [name], PT, DPT [phone]

## 2015-05-09 LAB — CBC
HCT: 36.8 % — ABNORMAL LOW (ref 39.0–52.0)
HEMOGLOBIN: 12.2 g/dL — AB (ref 13.0–17.0)
MCH: 32.8 pg (ref 26.0–34.0)
MCHC: 33.2 g/dL (ref 30.0–36.0)
MCV: 98.9 fL (ref 78.0–100.0)
PLATELETS: 226 10*3/uL (ref 150–400)
RBC: 3.72 MIL/uL — AB (ref 4.22–5.81)
RDW: 13.7 % (ref 11.5–15.5)
WBC: 14 10*3/uL — ABNORMAL HIGH (ref 4.0–10.5)

## 2015-05-09 LAB — BASIC METABOLIC PANEL
Anion gap: 7 (ref 5–15)
BUN: 15 mg/dL (ref 6–20)
CO2: 28 mmol/L (ref 22–32)
Calcium: 8.5 mg/dL — ABNORMAL LOW (ref 8.9–10.3)
Chloride: 103 mmol/L (ref 101–111)
Creatinine, Ser: 1.05 mg/dL (ref 0.61–1.24)
GFR calc non Af Amer: >60 mL/min (ref >60)
Glucose, Bld: 127 mg/dL — ABNORMAL HIGH (ref 65–99)
POTASSIUM: 3.6 mmol/L (ref 3.5–5.1)
SODIUM: 138 mmol/L (ref 135–145)

## 2015-05-09 NOTE — Progress Notes (Signed)
Physical Therapy Treatment Patient Details Name: Thomas CoveyClifford H [name] MRN: [id number] DOB: 05/04/1941 Today's Date: 05/09/2015    History of Present Illness Patient is a 74 y/o male s/p L THA, direct anterior approach. PMH includes HTN, PNA, CAD, hypothyroidism and R THA in 2014.    PT Comments    Pt doing exceptionally well with mobility. Completed stairs with difficulty. Pt anticipates he will DC home today and he is excited about that.  Follow Up Recommendations  Home health PT;Supervision/Assistance - 24 hour     Equipment Recommendations  None recommended by PT    Recommendations for Other Services       Precautions / Restrictions Precautions Precautions: Fall Precaution Comments: direct anterior approach - no precautions Restrictions Weight Bearing Restrictions: Yes LLE Weight Bearing: Weight bearing as tolerated    Mobility  Bed Mobility Overal bed mobility: Modified Independent Bed Mobility: Supine to Sit     Supine to sit: Modified independent (Device/Increase time)        Transfers Overall transfer level: Needs assistance Equipment used: Rolling walker (2 wheeled) Transfers: Sit to/from Stand Sit to Stand: Supervision         General transfer comment: performed well without cues  Ambulation/Gait Ambulation/Gait assistance: Supervision Ambulation Distance (Feet): 150 Feet Assistive device: Rolling walker (2 wheeled) Gait Pattern/deviations: Step-through pattern Gait velocity: WNL       Stairs Stairs: Yes Stairs assistance: Supervision Stair Management: Two rails;Forwards Number of Stairs: 3 General stair comments: performed twice  Wheelchair Mobility    Modified Rankin (Stroke Patients Only)       Balance                                    Cognition Arousal/Alertness: Awake/alert Behavior During Therapy: WFL for tasks assessed/performed Overall Cognitive Status: Within Functional Limits for tasks assessed                      Exercises Total Joint Exercises Ankle Circles/Pumps: AROM;Both;5 reps;Supine Heel Slides: AROM;5 reps;Left;Supine Hip ABduction/ADduction: AROM;Left;5 reps;Supine    General Comments General comments (skin integrity, edema, etc.): no family present. Pt excited to go home today      Pertinent Vitals/Pain Pain Assessment: 0-10 Pain Score: 1  Pain Location: Left thigh Pain Descriptors / Indicators: Aching Pain Intervention(s): Monitored during session    Home Living                      Prior Function            PT Goals (current goals can now be found in the care plan section) Acute Rehab PT Goals Patient Stated Goal: to go home today Progress towards PT goals: Progressing toward goals    Frequency  7X/week    PT Plan Current plan remains appropriate    Co-evaluation             End of Session Equipment Utilized During Treatment: Gait belt Activity Tolerance: Patient tolerated treatment well Patient left: in chair;with call bell/phone within reach;with family/visitor present     Time: [phone] [id number] (also treated [phone], but PA came in to work with pt ) PT Time Calculation (min) (ACUTE ONLY): 19 min  Charges:  $Gait Training: 23-37 mins                    G Codes:      Thomas [name], Thomas Luecke  [name] 05/09/2015, 10:27 AM  Thomas [name], PT  [phone] 05/09/2015

## 2015-05-09 NOTE — Progress Notes (Signed)
OT Cancellation Note and Discharge  Patient Details Name: Bernie CoveyClifford H Shirk MRN: [id number] DOB: [birth year]   Cancelled Treatment:    Reason Eval/Treat Not Completed: OT screened, no needs identified, will sign off. Pt moving at a Mod I level and does not voice any concerns about BADLs, had his other hip done 2 years ago with same approach.  Evette GeorgesLeonard, Garo Heidelberg Eva [phone] 05/09/2015, 11:05 AM

## 2015-05-09 NOTE — Plan of Care (Signed)
Problem: Consults Goal: Diagnosis- Total Joint Replacement Primary Total Hip     

## 2015-05-09 NOTE — Care Management Note (Addendum)
Case Management Note  Patient Details  Name: Thomas [name] MRN: [id number] Date of Birth: Jul 08, 1941  Subjective/Objective: 74 yo M underwent L THA.                   Action/Plan: PT is recommending 24 hr supervision/assistance and HHPT. No DME recommended.   Expected Discharge Date: 05/09/15                 Expected Discharge Plan:  [place]  In-House Referral:     Discharge planning Services  CM Consult  Post Acute Care Choice:  Home Health Choice offered to:     DME Arranged:    DME Agency:     HH Arranged:  PT HH Agency:  [facility]  Status of Service:  Completed, signed off  Medicare Important Message Given:    Date Medicare IM Given:    Medicare IM give by:    Date Additional Medicare IM Given:    Additional Medicare Important Message give by:     If discussed at [place] of Stay Meetings, dates discussed:    Additional Comments: met with pt to discuss D/C plan. He plans to return home with the support of his wife. He used [facility] in the past and he wants to use them again. He wants the same therapist Percell Boston). Referral made to [facility] prior to surgery. Contacted Butch Penny at [facility] and left a VM about pt's request to use the same therapist.    Norina Buzzard, RN 05/09/2015, 11:35 AM

## 2015-05-09 NOTE — Progress Notes (Signed)
   Subjective: 1 Day Post-Op Procedure(s) (LRB): LEFT TOTAL HIP ARTHROPLASTY ANTERIOR APPROACH (Left) Patient reports pain as mild.   Patient seen in rounds for Dr. Lequita HaltAluisio. Patient is well, and has had no acute complaints or problems other than discomfort in his left thigh. Notes that he has some blood in his urine earlier but feels that he is voiding fine. No SOB or chest pain.   Objective: Vital signs in last 24 hours: Temp:  [97.1 F (36.2 C)-98.4 F (36.9 C)] 97.1 F (36.2 C) (05/28 0703) Pulse Rate:  [64-94] 64 (05/28 0703) Resp:  [5-24] 17 (05/27 1432) BP: (121-155)/(48-76) 127/65 mmHg (05/28 0703) SpO2:  [96 %-100 %] 98 % (05/28 0703)  Intake/Output from previous day:  Intake/Output Summary (Last 24 hours) at 05/09/15 0925 Last data filed at 05/09/15 0700  Gross per 24 hour  Intake   1610 ml  Output   1325 ml  Net    285 ml     Labs:  Recent Labs  05/09/15 0442  HGB 12.2*    Recent Labs  05/09/15 0442  WBC 14.0*  RBC 3.72*  HCT 36.8*  PLT 226    Recent Labs  05/09/15 0442  NA 138  K 3.6  CL 103  CO2 28  BUN 15  CREATININE 1.05  GLUCOSE 127*  CALCIUM 8.5*   EXAM General - Patient is Alert and Oriented Extremity - Neurologically intact Neurovascular intact Dorsiflexion/Plantar flexion intact Compartment soft Dressing - dressing C/D/I Motor Function - intact, moving foot and toes well on exam.  Hemovac pulled without difficulty.  Past Medical History  Diagnosis Date  . Hyperlipidemia     takes Simvastatin daily  . Pneumonia     at 74 years old, double  . Arthritis   . Hypothyroidism     takes Synthroid daily  . Hypertension     takes Maxzide daily  . Coronary artery disease     takes Plavix and ASA daily  . History of blood clots 2008    in chest  . Joint pain   . Joint swelling     left knee  . GERD (gastroesophageal reflux disease)     occasional and will take Tums if needed  . History of kidney stones late 90's     Assessment/Plan: 1 Day Post-Op Procedure(s) (LRB): LEFT TOTAL HIP ARTHROPLASTY ANTERIOR APPROACH (Left) Active Problems:   OA (osteoarthritis) of hip  Estimated body mass index is 26.85 kg/(m^2) as calculated from the following:   Height as of this encounter: 6' (1.829 m).   Weight as of this encounter: 89.812 kg (198 lb). Advance diet Up with therapy D/C IV fluids Discharge home with home health  DVT Prophylaxis - resume Plavix Weight Bearing As Tolerated  He is doing fair. Will see how therapy goes today. Possible DC home depending on progress. Will continue to watch voiding.    Dimitri PedAmber Araiyah Cumpton, PA-C Orthopaedic Surgery 05/09/2015, 9:25 AM

## 2015-05-09 NOTE — Progress Notes (Signed)
Pt c/o bladder not being emptied via foley catheter. Catheter drainage bag and tubing noticed to have red tinted urine. Pt requested to have foley removed. This RN removed foley catheter. Some blood noticed at the tip of catheter, no active bleeding at this time. Pt resting comfortably. Nursing will continue to monitor.

## 2015-05-12 ENCOUNTER — Encounter (HOSPITAL_COMMUNITY): Payer: Self-pay | Admitting: Orthopedic Surgery

## 2015-05-21 NOTE — Discharge Summary (Signed)
Physician Discharge Summary   Patient ID: Thomas [name] MRN: [id number] DOB/AGE: [birth year] 74 y.o.  Admit date: 05/08/2015 Discharge date: 05/09/2015  Primary Diagnosis:  Osteoarthritis of the Left hip.   Admission Diagnoses:  Past Medical History  Diagnosis Date  . Hyperlipidemia     takes Simvastatin daily  . Pneumonia     at 74 years old, double  . Arthritis   . Hypothyroidism     takes Synthroid daily  . Hypertension     takes Maxzide daily  . Coronary artery disease     takes Plavix and ASA daily  . History of blood clots 2008    in chest  . Joint pain   . Joint swelling     left knee  . GERD (gastroesophageal reflux disease)     occasional and will take Tums if needed  . History of kidney stones late 90's   Discharge Diagnoses:   Active Problems:   OA (osteoarthritis) of hip  Estimated body mass index is 26.85 kg/(m^2) as calculated from the following:   Height as of this encounter: 6' (1.829 m).   Weight as of this encounter: 89.812 kg (198 lb).  Procedure(s) (LRB): LEFT TOTAL HIP ARTHROPLASTY ANTERIOR APPROACH (Left)   Consults: None  HPI: Thomas [name] is a 74 y.o. male who has advanced end-  stage arthritis of his Left hip with progressively worsening pain and  dysfunction.The patient has failed nonoperative management and presents for  total hip arthroplasty.   Laboratory Data: Admission on 05/08/2015, Discharged on 05/09/2015  Component Date Value Ref Range Status  . WBC 05/09/2015 14.0* 4.0 - 10.5 K/uL Final  . RBC 05/09/2015 3.72* 4.22 - 5.81 MIL/uL Final  . Hemoglobin 05/09/2015 12.2* 13.0 - 17.0 g/dL Final  . HCT 05/09/2015 36.8* 39.0 - 52.0 % Final  . MCV 05/09/2015 98.9  78.0 - 100.0 fL Final  . MCH 05/09/2015 32.8  26.0 - 34.0 pg Final  . MCHC 05/09/2015 33.2  30.0 - 36.0 g/dL Final  . RDW 05/09/2015 13.7  11.5 - 15.5 % Final  . Platelets 05/09/2015 226  150 - 400 K/uL Final  . Sodium 05/09/2015 138  135 - 145 mmol/L Final  .  Potassium 05/09/2015 3.6  3.5 - 5.1 mmol/L Final  . Chloride 05/09/2015 103  101 - 111 mmol/L Final  . CO2 05/09/2015 28  22 - 32 mmol/L Final  . Glucose, Bld 05/09/2015 127* 65 - 99 mg/dL Final  . BUN 05/09/2015 15  6 - 20 mg/dL Final  . Creatinine, Ser 05/09/2015 1.05  0.61 - 1.24 mg/dL Final  . Calcium 05/09/2015 8.5* 8.9 - 10.3 mg/dL Final  . GFR calc non Af Amer 05/09/2015 >60  >60 mL/min Final  . GFR calc Af Amer 05/09/2015 >60  >60 mL/min Final   Comment: (NOTE) The eGFR has been calculated using the CKD EPI equation. This calculation has not been validated in all clinical situations. eGFR's persistently <60 mL/min signify possible Chronic Kidney Disease.   Georgiann Hahn gap 05/09/2015 7  5 - 15 Final  Hospital Outpatient Visit on 04/27/2015  Component Date Value Ref Range Status  . aPTT 04/27/2015 27  24 - 37 seconds Final  . WBC 04/27/2015 6.4  4.0 - 10.5 K/uL Final  . RBC 04/27/2015 4.94  4.22 - 5.81 MIL/uL Final  . Hemoglobin 04/27/2015 16.5  13.0 - 17.0 g/dL Final  . HCT 04/27/2015 48.9  39.0 - 52.0 % Final  . MCV 04/27/2015  99.0  78.0 - 100.0 fL Final  . MCH 04/27/2015 33.4  26.0 - 34.0 pg Final  . MCHC 04/27/2015 33.7  30.0 - 36.0 g/dL Final  . RDW 04/27/2015 13.3  11.5 - 15.5 % Final  . Platelets 04/27/2015 234  150 - 400 K/uL Final  . Sodium 04/27/2015 139  135 - 145 mmol/L Final  . Potassium 04/27/2015 3.5  3.5 - 5.1 mmol/L Final  . Chloride 04/27/2015 101  101 - 111 mmol/L Final  . CO2 04/27/2015 27  22 - 32 mmol/L Final  . Glucose, Bld 04/27/2015 110* 65 - 99 mg/dL Final  . BUN 04/27/2015 16  6 - 20 mg/dL Final  . Creatinine, Ser 04/27/2015 1.21  0.61 - 1.24 mg/dL Final  . Calcium 04/27/2015 9.7  8.9 - 10.3 mg/dL Final  . Total Protein 04/27/2015 6.7  6.5 - 8.1 g/dL Final  . Albumin 04/27/2015 4.2  3.5 - 5.0 g/dL Final  . AST 04/27/2015 25  15 - 41 U/L Final  . ALT 04/27/2015 24  17 - 63 U/L Final  . Alkaline Phosphatase 04/27/2015 75  38 - 126 U/L Final  . Total  Bilirubin 04/27/2015 0.8  0.3 - 1.2 mg/dL Final  . GFR calc non Af Amer 04/27/2015 58* >60 mL/min Final  . GFR calc Af Amer 04/27/2015 >60  >60 mL/min Final   Comment: (NOTE) The eGFR has been calculated using the CKD EPI equation. This calculation has not been validated in all clinical situations. eGFR's persistently <60 mL/min signify possible Chronic Kidney Disease.   . Anion gap 04/27/2015 11  5 - 15 Final  . Prothrombin Time 04/27/2015 13.7  11.6 - 15.2 seconds Final  . INR 04/27/2015 1.04  0.00 - 1.49 Final  . ABO/RH(D) 04/27/2015 O NEG   Final  . Antibody Screen 04/27/2015 NEG   Final  . Sample Expiration 04/27/2015 05/11/2015   Final  . Color, Urine 04/27/2015 YELLOW  YELLOW Final  . APPearance 04/27/2015 CLEAR  CLEAR Final  . Specific Gravity, Urine 04/27/2015 1.018  1.005 - 1.030 Final  . pH 04/27/2015 8.0  5.0 - 8.0 Final  . Glucose, UA 04/27/2015 NEGATIVE  NEGATIVE mg/dL Final  . Hgb urine dipstick 04/27/2015 NEGATIVE  NEGATIVE Final  . Bilirubin Urine 04/27/2015 NEGATIVE  NEGATIVE Final  . Ketones, ur 04/27/2015 NEGATIVE  NEGATIVE mg/dL Final  . Protein, ur 04/27/2015 NEGATIVE  NEGATIVE mg/dL Final  . Urobilinogen, UA 04/27/2015 0.2  0.0 - 1.0 mg/dL Final  . Nitrite 04/27/2015 NEGATIVE  NEGATIVE Final  . Leukocytes, UA 04/27/2015 NEGATIVE  NEGATIVE Final   MICROSCOPIC NOT DONE ON URINES WITH NEGATIVE PROTEIN, BLOOD, LEUKOCYTES, NITRITE, OR GLUCOSE <1000 mg/dL.  [name] MRSA, PCR 04/27/2015 NEGATIVE  NEGATIVE Final  . Staphylococcus aureus 04/27/2015 NEGATIVE  NEGATIVE Final   Comment:        The Xpert SA Assay (FDA approved for NASAL specimens in patients over 71 years of age), is one component of a comprehensive surveillance program.  Test performance has been validated by [facility] for patients greater than or equal to 16 year old. It is not intended to diagnose infection nor to guide or monitor treatment.   . ABO/RH(D) 04/27/2015 O NEG   Final     X-Rays:Dg  Pelvis Portable  05/08/2015   CLINICAL DATA:  Postop left hip replacement  EXAM: PORTABLE PELVIS 1-2 VIEWS  COMPARISON:  10/28/2013  FINDINGS: Remote right hip replacement. Interval left hip replacement. Normal AP alignment. No hardware  or bony complicating feature. Left soft tissue drain in place.  IMPRESSION: Left hip replacement without complicating feature.   Electronically Signed   By: Rolm Baptise M.D.   On: 05/08/2015 13:40   Dg C-arm 1-60 Min-no Report  05/08/2015   CLINICAL DATA: THA   C-ARM 1-60 MINUTES  Fluoroscopy was utilized by the requesting physician.  No radiographic  interpretation.     EKG: Orders placed or performed in visit on 03/26/15  . EKG     Hospital Course: Patient was admitted to [facility] and taken to the OR and underwent the above state procedure without complications.  Patient tolerated the procedure well and was later transferred to the recovery room and then to the orthopaedic floor for postoperative care.  They were given PO and IV analgesics for pain control following their surgery.  They were given 24 hours of postoperative antibiotics of      Anti-infectives    Start     Dose/Rate Route Frequency Ordered Stop   05/08/15 1700  ceFAZolin (ANCEF) IVPB 2 g/50 mL premix     2 g 100 mL/hr over 30 Minutes Intravenous Every 6 hours 05/08/15 1430 05/09/15 0657   05/08/15 0945  ceFAZolin (ANCEF) IVPB 2 g/50 mL premix  Status:  Discontinued     2 g 100 mL/hr over 30 Minutes Intravenous On call to O.R. 05/07/15 1209 05/07/15 1220   05/08/15 0930  ceFAZolin (ANCEF) IVPB 2 g/50 mL premix     2 g 100 mL/hr over 30 Minutes Intravenous To [facility]Stay Surgical 05/07/15 1220 05/08/15 1115     and started on DVT prophylaxis in the form of Xarelto.   PT and OT were ordered for total hip protocol.  The patient was allowed to be WBAT with therapy. Discharge planning was consulted to help with postop disposition and equipment needs.  Patient had a decent night on the  evening of surgery.  They started to get up OOB with therapy on day one.  Hemovac drain was pulled without difficulty.  Noted blood in urine but was likely due to the catheter.  Was voiding without difficulty. Patient was seen in rounds on day one, progressed with therapy and was ready to go home.  Diet: Cardiac diet Activity:WBAT Follow-up:in 2 weeks Disposition - Home Discharged Condition: good      Medication List    TAKE these medications        aspirin 325 MG tablet  Take 325 mg by mouth daily.     beta carotene w/minerals tablet  Take 1 tablet by mouth daily.     clopidogrel 75 MG tablet  Commonly known as:  PLAVIX  Take 75 mg by mouth daily with breakfast.     cyclobenzaprine 10 MG tablet  Commonly known as:  FLEXERIL  Take 1 tablet (10 mg total) by mouth 3 (three) times daily as needed for muscle spasms.     HYDROmorphone 2 MG tablet  Commonly known as:  DILAUDID  Take 1-2 tablets (2-4 mg total) by mouth every 3 (three) hours as needed for severe pain.     levothyroxine 200 MCG tablet  Commonly known as:  SYNTHROID, LEVOTHROID  Take 200 mcg by mouth daily before breakfast.     multivitamin with minerals Tabs tablet  Take 1 tablet by mouth daily.     nitroGLYCERIN 0.4 MG SL tablet  Commonly known as:  NITROSTAT  Place 0.4 mg under the tongue every 5 (five) minutes as needed for  chest pain.     potassium chloride SA 20 MEQ tablet  Commonly known as:  K-DUR,KLOR-CON  Take 20 mEq by mouth daily.     simvastatin 40 MG tablet  Commonly known as:  ZOCOR  Take 40 mg by mouth every evening.     triamcinolone cream 0.5 %  Commonly known as:  KENALOG  Apply 1 application topically daily as needed (rash).     triamterene-hydrochlorothiazide 75-50 MG per tablet  Commonly known as:  MAXZIDE  Take 0.5 tablets by mouth every morning.       Follow-up Information    Follow up with Gearlean Alf, MD. Schedule an appointment as soon as possible for a visit on  05/21/2015.   Specialty:  Orthopedic Surgery   Why:  Call [phone] Tuesday to make the appointment   Contact information:   [address] [place] [address] [phone]       Follow up with [facility].   Why:  Home Health Physical Therapy arranged by the doctor's office.   Contact information:   [place] [address] [phone]       Signed: Arlee Muslim, PA-C Orthopaedic Surgery 05/21/2015, 9:01 AM

## 2015-07-30 ENCOUNTER — Other Ambulatory Visit: Payer: Self-pay | Admitting: Family Medicine

## 2015-07-30 ENCOUNTER — Ambulatory Visit
Admission: RE | Admit: 2015-07-30 | Discharge: 2015-07-30 | Disposition: A | Discharge status: Home / Self Care | Payer: Medicare Other | Source: Ambulatory Visit | Attending: Family Medicine | Admitting: Family Medicine

## 2015-07-30 DIAGNOSIS — R0600 Dyspnea, unspecified: Secondary | ICD-10-CM

## 2016-02-29 ENCOUNTER — Encounter: Payer: Self-pay | Admitting: Cardiovascular Disease

## 2016-02-29 ENCOUNTER — Ambulatory Visit (INDEPENDENT_AMBULATORY_CARE_PROVIDER_SITE_OTHER): Payer: Medicare Other | Admitting: Cardiovascular Disease

## 2016-02-29 VITALS — BP 136/88 | HR 80 | Ht 72.0 in | Wt 219.0 lb

## 2016-02-29 DIAGNOSIS — I2510 Atherosclerotic heart disease of native coronary artery without angina pectoris: Secondary | ICD-10-CM | POA: Diagnosis not present

## 2016-02-29 DIAGNOSIS — E785 Hyperlipidemia, unspecified: Secondary | ICD-10-CM | POA: Diagnosis not present

## 2016-02-29 LAB — COMPREHENSIVE METABOLIC PANEL
ALK PHOS: 70 U/L (ref 40–115)
ALT: 16 U/L (ref 9–46)
AST: 15 U/L (ref 10–35)
Albumin: 4.2 g/dL (ref 3.6–5.1)
BUN: 22 mg/dL (ref 7–25)
CO2: 25 mmol/L (ref 20–31)
CREATININE: 1.39 mg/dL — AB (ref 0.70–1.18)
Calcium: 9.6 mg/dL (ref 8.6–10.3)
Chloride: 102 mmol/L (ref 98–110)
Glucose, Bld: 108 mg/dL — ABNORMAL HIGH (ref 65–99)
POTASSIUM: 4 mmol/L (ref 3.5–5.3)
Sodium: 139 mmol/L (ref 135–146)
Total Bilirubin: 0.8 mg/dL (ref 0.2–1.2)
Total Protein: 6.8 g/dL (ref 6.1–8.1)

## 2016-02-29 LAB — LIPID PANEL
CHOL/HDL RATIO: 4.1 ratio (ref <=5.0)
CHOLESTEROL: 147 mg/dL (ref 125–200)
HDL: 36 mg/dL — ABNORMAL LOW (ref >=40)
LDL Cholesterol: 75 mg/dL (ref <130)
Triglycerides: 181 mg/dL — ABNORMAL HIGH (ref <150)
VLDL: 36 mg/dL — ABNORMAL HIGH (ref <30)

## 2016-02-29 NOTE — Progress Notes (Signed)
Cardiology Office Note   Date:  02/29/2016   ID:  Thomas [name], DOB 03/07/1941, MRN [id number]  PCP:  Kaleen MaskELKINS,WILSON OLIVER, MD  Cardiologist:   Vesta MixerNahser, Alverda Nazzaro J, MD   Chief Complaint  Patient presents with  . Coronary Artery Disease   Problem list: 1. Coronary artery disease-has a stent in his left anterior descending artery. The proximal edge of the stent hangs out into the left main. He's had thrombus formation on the proximal edge of the stent that embolized down the left circumflex artery when he was off Plavix. Because of this we have kept him on Plavix lifelong   2. Essential hypertension 3. Hyperlipidemia 4. Osteoarthritis  Thomas [name] is a 75 year old gentleman with a history of coronary artery disease. He is status post PTCA and stenting of his left anterior descending artery. He's had thrombus formation on the proximal edge of the stent which extended out over the left circumflex artery. He's been on Plavix since that time.  He complains of some hip pain. He still smokes a few cigarettes a day.  February 26, 2014:  Since I last saw him 1 1/2 years ago, he has had hip surgery . I had recommneded that he not stop his plavix. Dr. Despina HickAlusio opperated on him ON plavix and had no significant bleeding episodes.   He has not had any CP   February 26, 2015 Thomas [name] is a 75 y.o. male who presents for pre op visit for left hip replacement.  He had his last hip replacment WHILE on full dose Plavix and had no bleeding .  He has no CP or dyspnea.  He is limited by his hip problems.   February 29, 2016:  Doing well.   Had another hip replacement in May .  Had the surgery while on Plavix . No CP Stays avtive.  Walks a mile a day     Past Medical History  Diagnosis Date  . Hyperlipidemia     takes Simvastatin daily  . Pneumonia     at 75 years old, double  . Arthritis   . Hypothyroidism     takes Synthroid daily  . Hypertension     takes Maxzide daily  . Coronary  artery disease     takes Plavix and ASA daily  . History of blood clots 2008    in chest  . Joint pain   . Joint swelling     left knee  . GERD (gastroesophageal reflux disease)     occasional and will take Tums if needed  . History of kidney stones late 90's    Past Surgical History  Procedure Laterality Date  . Cardiac catheterization    . Hemorroidectomy  late 1970's  . Hernia repair Right 1980's  . 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 Coronary angioplasty  05/21/2007    lad stent/taxus stent  . Total hip arthroplasty Left 05/08/2015    Procedure: LEFT TOTAL HIP ARTHROPLASTY ANTERIOR APPROACH;  Surgeon: Ollen GrossFrank Aluisio, MD;  Location: [facility];  Service: Orthopedics;  Laterality: Left;     Current Outpatient Prescriptions  Medication Sig Dispense Refill  . aspirin 325 MG tablet Take 325 mg by mouth daily.      . clopidogrel (PLAVIX) 75 MG tablet Take 75 mg by mouth daily with breakfast.    . levothyroxine (  SYNTHROID, LEVOTHROID) 175 MCG tablet Take 175 mcg by mouth daily before breakfast.    . lisinopril (PRINIVIL,ZESTRIL) 20 MG tablet Take 10 mg by mouth daily.    . nitroGLYCERIN (NITROSTAT) 0.4 MG SL tablet Place 0.4 mg under the tongue every 5 (five) minutes as needed for chest pain.     . simvastatin (ZOCOR) 40 MG tablet Take 40 mg by mouth every evening.     No current facility-administered medications for this visit.    Allergies:   Oxycodone; Robaxin; and Tramadol    Social History:  The patient  reports that he has been smoking Cigarettes.  He has a 13 pack-year smoking history. He has never used smokeless tobacco. He reports that he does not drink alcohol or use illicit drugs.   Family History:  The patient's family history includes CAD in his mother; Heart failure in his father and mother; Pneumonia in his  father.    ROS:  Please see the history of present illness.    Review of Systems: Constitutional:  denies fever, chills, diaphoresis, appetite change and fatigue.  HEENT: denies photophobia, eye pain, redness, hearing loss, ear pain, congestion, sore throat, rhinorrhea, sneezing, neck pain, neck stiffness and tinnitus.  Respiratory: denies SOB, DOE, cough, chest tightness, and wheezing.  Cardiovascular: denies chest pain, palpitations and leg swelling.  Gastrointestinal: denies nausea, vomiting, abdominal pain, diarrhea, constipation, blood in stool.  Genitourinary: denies dysuria, urgency, frequency, hematuria, flank pain and difficulty urinating.  Musculoskeletal: denies  myalgias, back pain, joint swelling, arthralgias and gait problem.   Skin: denies pallor, rash and wound.  Neurological: denies dizziness, seizures, syncope, weakness, light-headedness, numbness and headaches.   Hematological: denies adenopathy, easy bruising, personal or family bleeding history.  Psychiatric/ Behavioral: denies suicidal ideation, mood changes, confusion, nervousness, sleep disturbance and agitation.       All other systems are reviewed and negative.    PHYSICAL EXAM: VS:  BP 136/88 mmHg  Pulse 80  Ht 6' (1.829 m)  Wt 219 lb (99.338 kg)  BMI 29.70 kg/m2 , BMI Body mass index is 29.7 kg/(m^2). GEN: Well nourished, well developed, in no acute distress HEENT: normal Neck: no JVD, carotid bruits, or masses Cardiac: RRR; no murmurs, rubs, or gallops,no edema  Respiratory:  clear to auscultation bilaterally, normal work of breathing GI: soft, nontender, nondistended, + BS MS: no deformity or atrophy Skin: warm and dry, no rash Neuro:  Strength and sensation are intact Psych: normal   EKG:  EKG is ordered today. February 29, 2016:  NSR at 80.   LAD,  RBBB    Recent Labs: 04/27/2015: ALT 24 05/09/2015: BUN 15; Creatinine, Ser 1.05; Hemoglobin 12.2*; Platelets 226; Potassium 3.6; Sodium 138     Lipid Panel    Component Value Date/Time   CHOL 177 12/21/2011 1103   TRIG 142.0 12/21/2011 1103   HDL 39.40 12/21/2011 1103   CHOLHDL 4 12/21/2011 1103   VLDL 28.4 12/21/2011 1103   LDLCALC 109* 12/21/2011 1103      Wt Readings from Last 3 Encounters:  02/29/16 219 lb (99.338 kg)  05/08/15 198 lb (89.812 kg)  04/27/15 198 lb 14.4 oz (90.22 kg)      Other studies Reviewed: Additional studies/ records that were reviewed today include: . Review of the above records demonstrates:    ASSESSMENT AND PLAN:  1. Coronary artery disease-has a stent in his left anterior descending artery. The proximal edge of the stent hangs out into the left main. He's had thrombus  formation on the proximal edge of the stent that embolized down the left circumflex artery when he was off Plavix. Because of this we have kept him on Plavix lifelong   For his last hip surgery, Dr. Despina Hick  operated on him while he was still on full dose Plavix. He did not have any excessive bleeding. My recommendation is that he have this next hip replacement on Plavix as well.    He seems to be doing well No angina  2. Essential hypertension - BP is well controlled.    3. Hyperlipidemia -    4. Osteoarthritis   Current medicines are reviewed at length with the patient today.  The patient does not have concerns regarding medicines.  The following changes have been made:  no change  Labs/ tests ordered today include:  No orders of the defined types were placed in this encounter.     Disposition:   FU with me in 1 year    Signed, Lillyana Majette, Deloris Ping, MD  02/29/2016 8:19 AM    [facility] [address] [place], [place], [place]  [address] Phone: [phone]; Fax: [phone]

## 2016-02-29 NOTE — Patient Instructions (Signed)

## 2016-03-07 IMAGING — CR DG PORTABLE PELVIS
2 series · 2 of 2 positions shown · non-contrast
Comparison: 10/28/2013

CLINICAL DATA: Postop left hip replacement

EXAM:
PORTABLE PELVIS 1-2 VIEWS

[AP (1 of 2)]
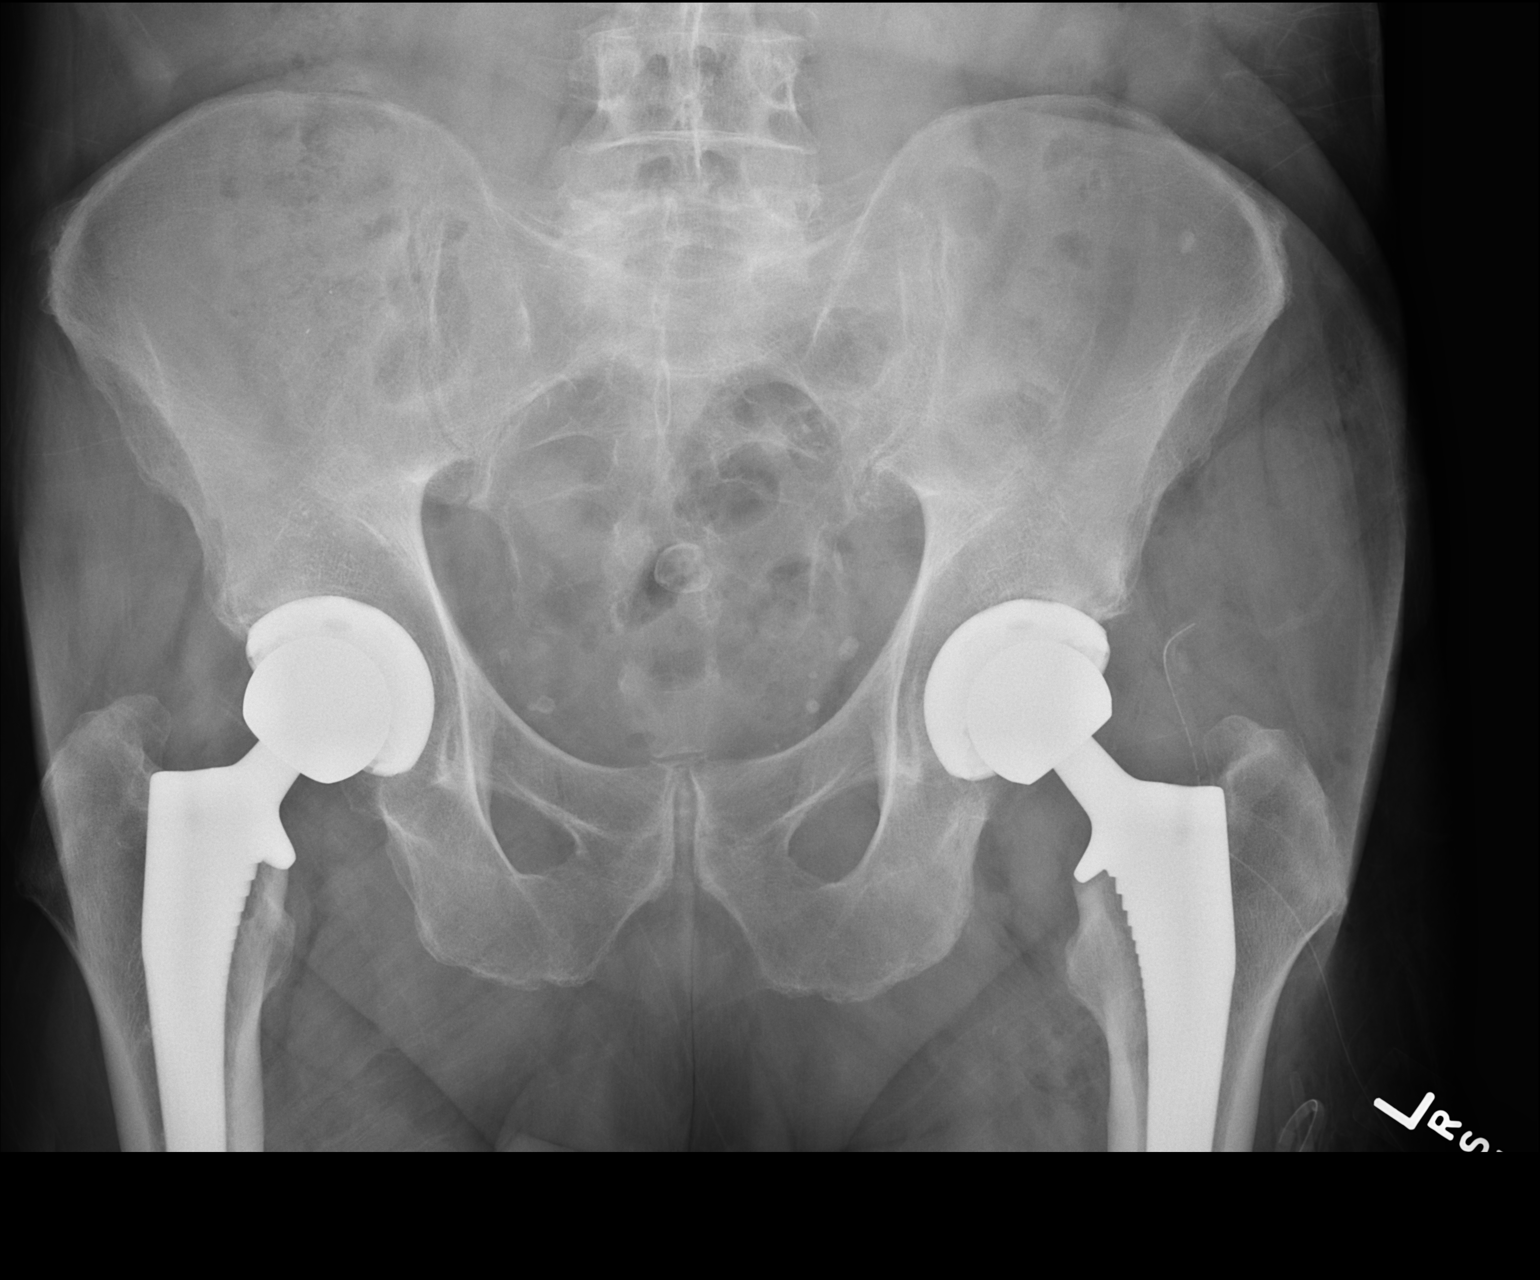

[AP (2 of 2)]
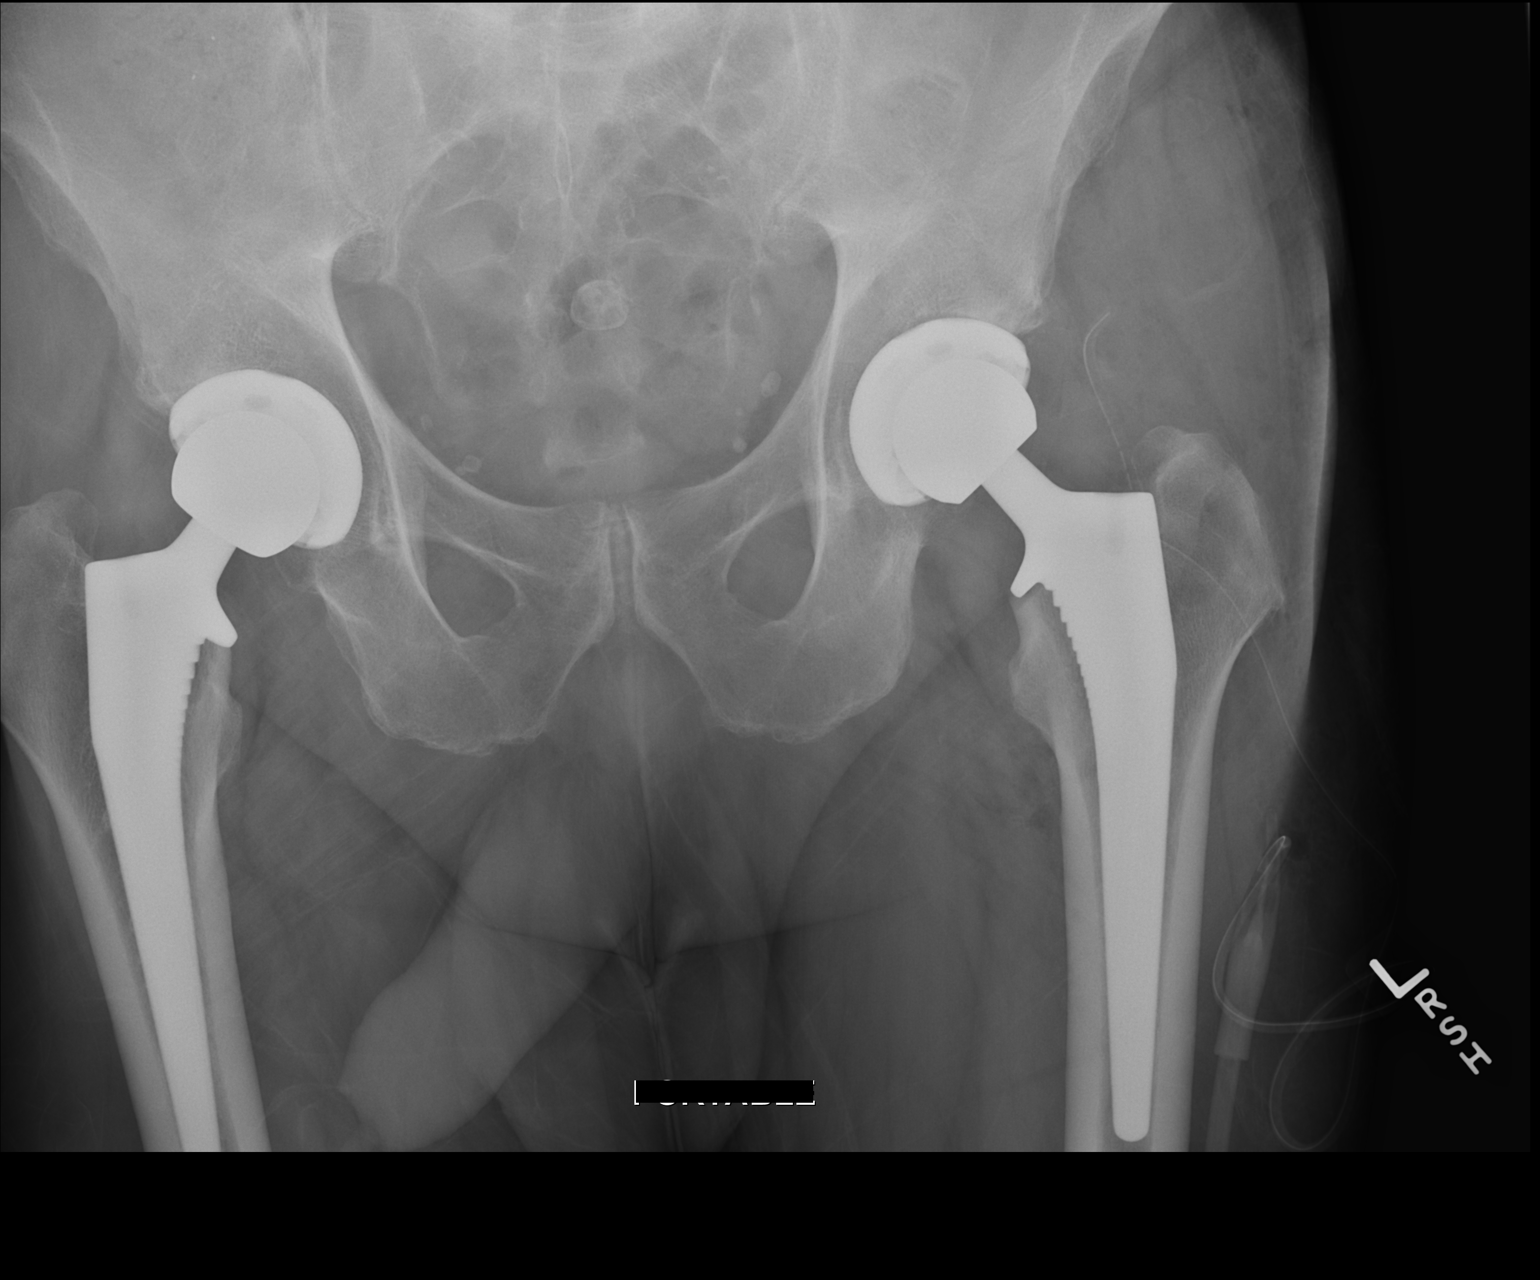

[2 of 2 positions shown; findings below may reference images not displayed]

FINDINGS: Remote right hip replacement. Interval left hip replacement. Normal
AP alignment. No hardware or bony complicating feature. Left soft
tissue drain in place.
IMPRESSION: Left hip replacement without complicating feature.

## 2016-05-29 IMAGING — CR DG CHEST 2V
2 series · 2 of 2 positions shown · non-contrast
Comparison: 10/22/2013 .

CLINICAL DATA: Shortness of breath.

EXAM:
CHEST  2 VIEW

[view not recorded (1 of 2)]
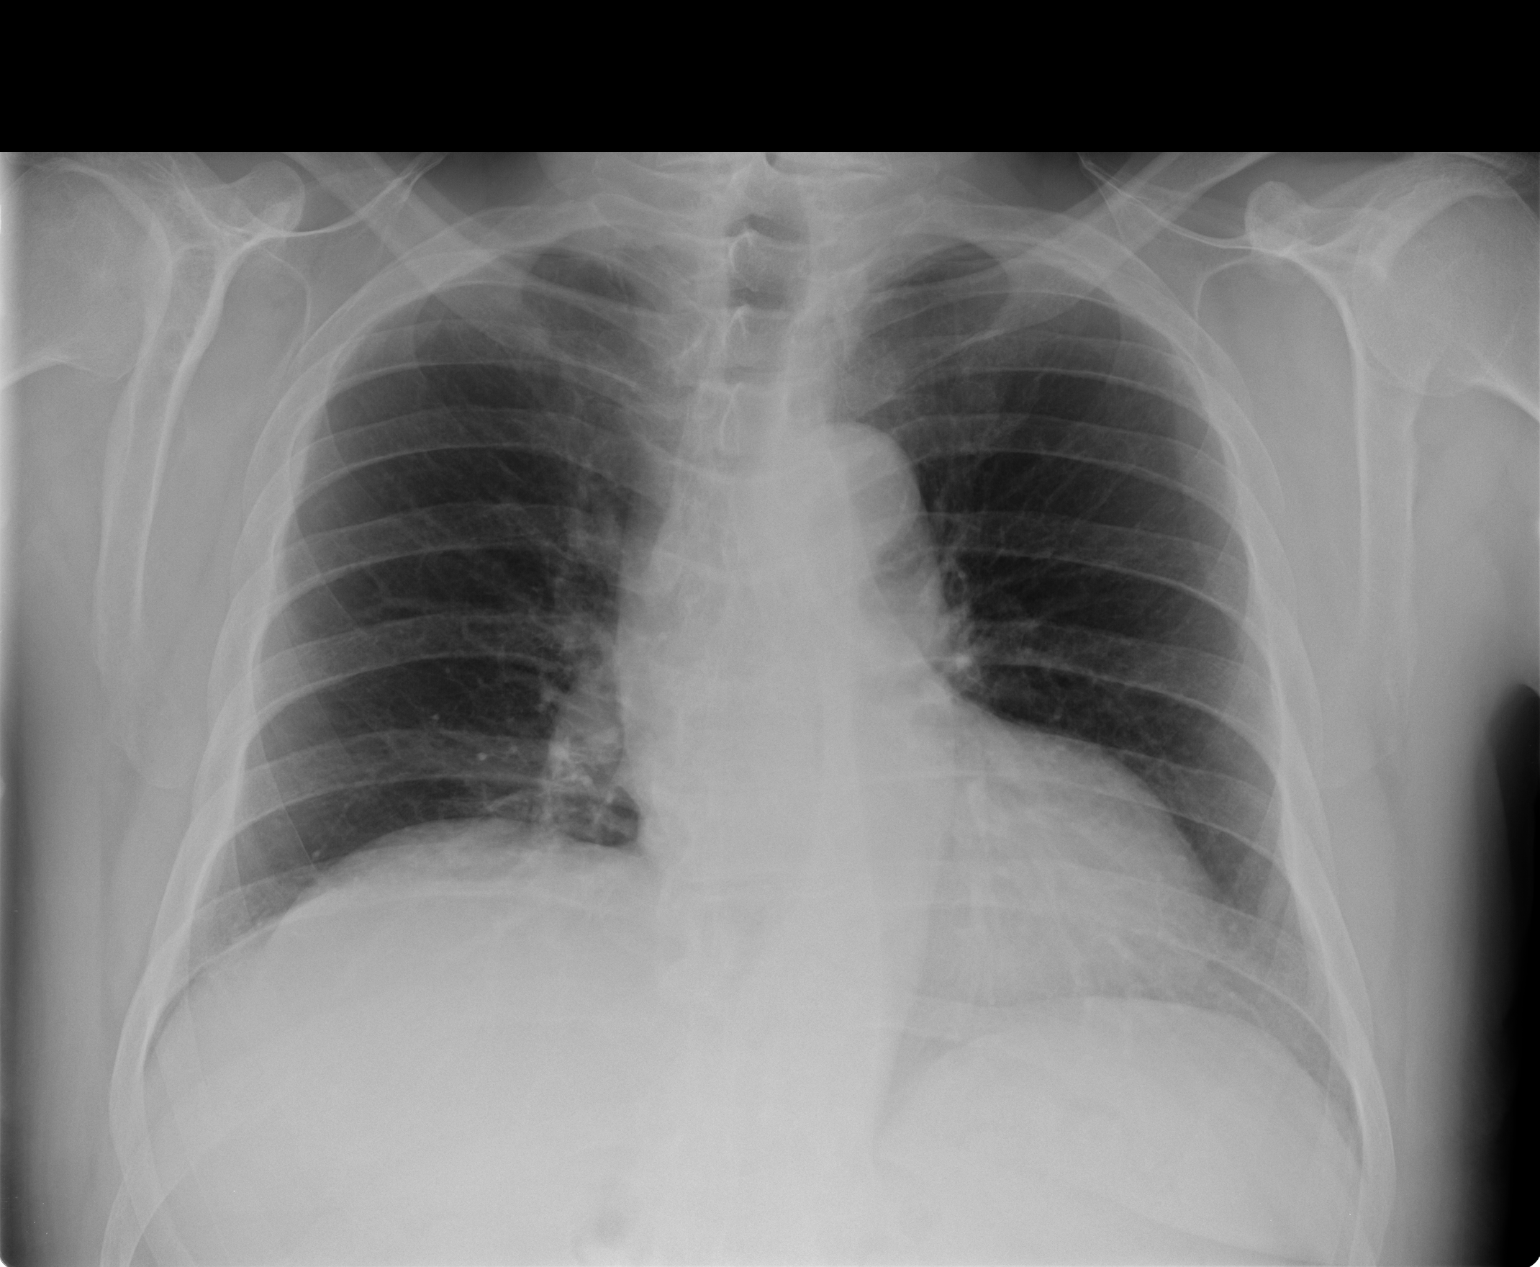

[view not recorded (2 of 2)]
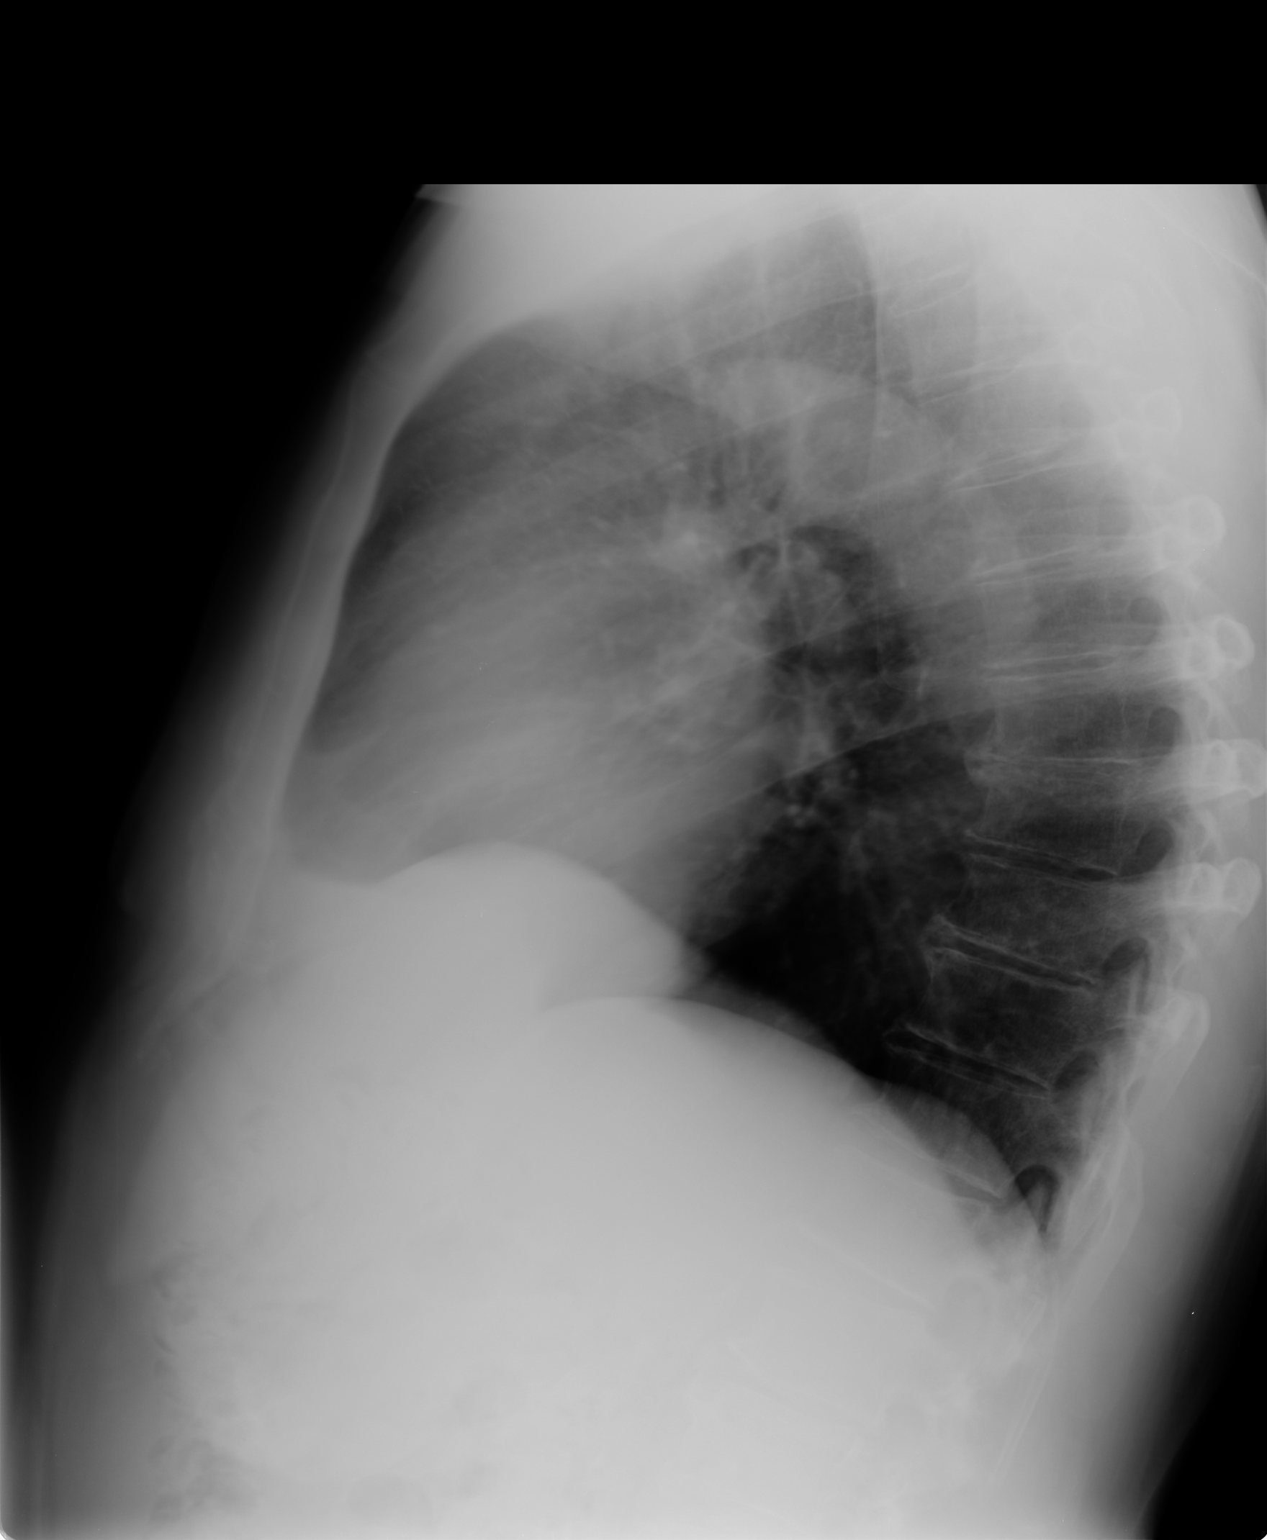

[2 of 2 positions shown; findings below may reference images not displayed]

FINDINGS: Mediastinum hilar structures normal. Low lung volumes with mild
bibasilar subsegmental atelectasis. Heart size stable. No pleural
effusion or pneumothorax. Degenerative changes thoracic spine P
IMPRESSION: Low lung volumes with mild bibasilar subsegmental atelectasis.

## 2017-02-28 ENCOUNTER — Ambulatory Visit (INDEPENDENT_AMBULATORY_CARE_PROVIDER_SITE_OTHER): Payer: Medicare Other | Admitting: Cardiovascular Disease

## 2017-02-28 ENCOUNTER — Encounter: Payer: Self-pay | Admitting: Cardiovascular Disease

## 2017-02-28 VITALS — BP 132/86 | HR 70 | Ht 72.0 in | Wt 222.0 lb

## 2017-02-28 DIAGNOSIS — I2510 Atherosclerotic heart disease of native coronary artery without angina pectoris: Secondary | ICD-10-CM

## 2017-02-28 DIAGNOSIS — E782 Mixed hyperlipidemia: Secondary | ICD-10-CM | POA: Diagnosis not present

## 2017-02-28 LAB — COMPREHENSIVE METABOLIC PANEL
ALT: 18 IU/L (ref 0–44)
AST: 29 IU/L (ref 0–40)
Albumin/Globulin Ratio: 1.9 (ref 1.2–2.2)
Albumin: 4.3 g/dL (ref 3.5–4.8)
Alkaline Phosphatase: 74 IU/L (ref 39–117)
BUN/Creatinine Ratio: 13 (ref 10–24)
BUN: 16 mg/dL (ref 8–27)
Bilirubin Total: 0.6 mg/dL (ref 0.0–1.2)
CALCIUM: 9.4 mg/dL (ref 8.6–10.2)
CO2: 23 mmol/L (ref 18–29)
CREATININE: 1.26 mg/dL (ref 0.76–1.27)
Chloride: 103 mmol/L (ref 96–106)
GFR calc non Af Amer: 56 mL/min/{1.73_m2} — ABNORMAL LOW (ref >59)
GFR, EST AFRICAN AMERICAN: 65 mL/min/{1.73_m2} (ref >59)
GLUCOSE: 110 mg/dL — AB (ref 65–99)
Globulin, Total: 2.3 g/dL (ref 1.5–4.5)
Potassium: 4.7 mmol/L (ref 3.5–5.2)
Sodium: 143 mmol/L (ref 134–144)
TOTAL PROTEIN: 6.6 g/dL (ref 6.0–8.5)

## 2017-02-28 LAB — LIPID PANEL
Chol/HDL Ratio: 3.5 ratio units (ref 0.0–5.0)
Cholesterol, Total: 134 mg/dL (ref 100–199)
HDL: 38 mg/dL — AB (ref >39)
LDL CALC: 77 mg/dL (ref 0–99)
Triglycerides: 93 mg/dL (ref 0–149)
VLDL CHOLESTEROL CAL: 19 mg/dL (ref 5–40)

## 2017-02-28 NOTE — Patient Instructions (Signed)
Your physician recommends that you continue on your current medications as directed. Please refer to the Current Medication list given to you today.  Your physician recommends that you return for lab work today (CMET, LIPIDS)  Your physician wants you to follow-up in: 1 YEAR WITH DR. Elease HashimotoNAHSER.  You will receive a reminder letter in the mail two months in advance. If you don't receive a letter, please call our office to schedule the follow-up appointment.

## 2017-02-28 NOTE — Progress Notes (Signed)
Cardiology Office Note   Date:  02/28/2017   ID:  Thomas [name], DOB 1941/11/10, MRN [id number]  PCP:  Kaleen MaskELKINS,WILSON OLIVER, MD  Cardiologist:   Kristeen MissPhilip Nahser, MD   Chief Complaint  Patient presents with  . Coronary Artery Disease   Problem list: 1. Coronary artery disease-has a stent in his left anterior descending artery. The proximal edge of the stent hangs out into the left main. He's had thrombus formation on the proximal edge of the stent that embolized down the left circumflex artery when he was off Plavix. Because of this we have kept him on Plavix lifelong   2. Essential hypertension 3. Hyperlipidemia 4. Osteoarthritis  Thomas [name] is a 76 year old gentleman with a history of coronary artery disease. He is status post PTCA and stenting of his left anterior descending artery. He's had thrombus formation on the proximal edge of the stent which extended out over the left circumflex artery. He's been on Plavix since that time.  He complains of some hip pain. He still smokes a few cigarettes a day.  February 26, 2014:  Since I last saw him 1 1/2 years ago, he has had hip surgery . I had recommneded that he not stop his plavix. Dr. Despina HickAlusio opperated on him ON plavix and had no significant bleeding episodes.   He has not had any CP   February 26, 2015 Thomas [name] H [name] is a 76 y.o. male who presents for pre op visit for left hip replacement.  He had his last hip replacment WHILE on full dose Plavix and had no bleeding .  He has no CP or dyspnea.  He is limited by his hip problems.   February 29, 2016:  Doing well.   Had another hip replacement in May .  Had the surgery while on Plavix . No CP Stays active.  Walks a mile a day   February 28, 2017:  Thomas [name] is seen back today for f/u of his CAD No CP or dyspnea.  Exercising regularly   Past Medical History:  Diagnosis Date  . Arthritis   . Coronary artery disease    takes Plavix and ASA daily  . GERD (gastroesophageal  reflux disease)    occasional and will take Tums if needed  . History of blood clots 2008   in chest  . History of kidney stones late 90's  . Hyperlipidemia    takes Simvastatin daily  . Hypertension    takes Maxzide daily  . Hypothyroidism    takes Synthroid daily  . Joint pain   . Joint swelling    left knee  . Pneumonia    at 76 years old, double    Past Surgical History:  Procedure Laterality Date  . CARDIAC CATHETERIZATION    . CORONARY ANGIOPLASTY  05/21/2007   lad stent/taxus stent  . HEMORROIDECTOMY  late 1970's  . HERNIA REPAIR Right 1980's  . TOTAL HIP ARTHROPLASTY Right 10/28/2013   Procedure: RIGHT TOTAL HIP ARTHROPLASTY ANTERIOR APPROACH;  Surgeon: Loanne DrillingFrank V Aluisio, MD;  Location: [facility];  Service: Orthopedics;  Laterality: Right;  . TOTAL HIP ARTHROPLASTY Left 05/08/2015   Procedure: LEFT TOTAL HIP ARTHROPLASTY ANTERIOR APPROACH;  Surgeon: Ollen GrossFrank Aluisio, MD;  Location: [facility];  Service: Orthopedics;  Laterality: Left;     Current Outpatient Prescriptions  Medication Sig Dispense Refill  . albuterol (PROVENTIL HFA;VENTOLIN HFA) 108 (90 Base) MCG/ACT inhaler Inhale 1-2 puffs into the lungs every 6 (six) hours as needed for wheezing or shortness  of breath.    [name] aspirin 325 MG tablet Take 325 mg by mouth daily.      . clopidogrel (PLAVIX) 75 MG tablet Take 75 mg by mouth daily with breakfast.    . levothyroxine (SYNTHROID, LEVOTHROID) 175 MCG tablet Take 175 mcg by mouth daily before breakfast.    . lisinopril (PRINIVIL,ZESTRIL) 20 MG tablet Take 10 mg by mouth daily.    . nitroGLYCERIN (NITROSTAT) 0.4 MG SL tablet Place 0.4 mg under the tongue every 5 (five) minutes as needed for chest pain.     . simvastatin (ZOCOR) 40 MG tablet Take 40 mg by mouth every evening.     No current facility-administered medications for this visit.     Allergies:   Oxycodone; Robaxin [methocarbamol]; and Tramadol    Social History:  The patient  reports that he has been smoking  Cigarettes.  He has a 13.00 pack-year smoking history. He has never used smokeless tobacco. He reports that he does not drink alcohol or use drugs.   Family History:  The patient's family history includes CAD in his mother; Heart failure in his father and mother; Pneumonia in his father.    ROS:  Please see the history of present illness.    Review of Systems: Constitutional:  denies fever, chills, diaphoresis, appetite change and fatigue.  HEENT: denies photophobia, eye pain, redness, hearing loss, ear pain, congestion, sore throat, rhinorrhea, sneezing, neck pain, neck stiffness and tinnitus.  Respiratory: denies SOB, DOE, cough, chest tightness, and wheezing.  Cardiovascular: denies chest pain, palpitations and leg swelling.  Gastrointestinal: denies nausea, vomiting, abdominal pain, diarrhea, constipation, blood in stool.  Genitourinary: denies dysuria, urgency, frequency, hematuria, flank pain and difficulty urinating.  Musculoskeletal: denies  myalgias, back pain, joint swelling, arthralgias and gait problem.   Skin: denies pallor, rash and wound.  Neurological: denies dizziness, seizures, syncope, weakness, light-headedness, numbness and headaches.   Hematological: denies adenopathy, easy bruising, personal or family bleeding history.  Psychiatric/ Behavioral: denies suicidal ideation, mood changes, confusion, nervousness, sleep disturbance and agitation.       All other systems are reviewed and negative.    PHYSICAL EXAM: VS:  BP 132/86   Pulse 70   Ht 6' (1.829 m)   Wt 222 lb (100.7 kg)   SpO2 98%   BMI 30.11 kg/m  , BMI Body mass index is 30.11 kg/m. GEN: Well nourished, well developed, in no acute distress  HEENT: normal  Neck: no JVD, carotid bruits, or masses Cardiac: RRR; no murmurs, rubs, or gallops,no edema  Respiratory:  clear to auscultation bilaterally, normal work of breathing GI: soft, nontender, nondistended, + BS MS: no deformity or atrophy  Skin:  warm and dry, no rash Neuro:  Strength and sensation are intact Psych: normal   EKG:  EKG is ordered today. February 29, 2016:  NSR at 68.   LAD,  RBBB    Recent Labs: 02/29/2016: ALT 16; BUN 22; Creat 1.39; Potassium 4.0; Sodium 139    Lipid Panel    Component Value Date/Time   CHOL 147 02/29/2016 0839   TRIG 181 (H) 02/29/2016 0839   HDL 36 (L) 02/29/2016 0839   CHOLHDL 4.1 02/29/2016 0839   VLDL 36 (H) 02/29/2016 0839   LDLCALC 75 02/29/2016 0839      Wt Readings from Last 3 Encounters:  02/28/17 222 lb (100.7 kg)  02/29/16 219 lb (99.3 kg)  05/08/15 198 lb (89.8 kg)      Other studies Reviewed: Additional studies/  records that were reviewed today include: . Review of the above records demonstrates:    ASSESSMENT AND PLAN:  1. Coronary artery disease-has a stent in his left anterior descending artery. The proximal edge of the stent hangs out into the left main. He's had thrombus formation on the proximal edge of the stent that embolized down the left circumflex artery when he was off Plavix. Because of this we have kept him on Plavix lifelong   He has had 2 hip replacements while on Plavix and did very well.   He should remain on Plavix lifelong .    He seems to be doing well No angina  2. Essential hypertension - BP is well controlled.    3. Hyperlipidemia -  Check lipids today   4. Osteoarthritis   Current medicines are reviewed at length with the patient today.  The patient does not have concerns regarding medicines.  The following changes have been made:  no change  Labs/ tests ordered today include:  No orders of the defined types were placed in this encounter.    Disposition:   FU with me in 1 year    Signed, Kristeen Miss, MD  02/28/2017 8:18 AM    [facility] [address] [place], [place], [place]  [address] Phone: [phone]; Fax: [phone]

## 2018-04-02 ENCOUNTER — Ambulatory Visit: Payer: Medicare Other | Admitting: Cardiovascular Disease

## 2018-04-02 ENCOUNTER — Encounter: Payer: Self-pay | Admitting: Cardiovascular Disease

## 2018-04-02 VITALS — BP 134/76 | HR 60 | Ht 72.0 in | Wt 217.4 lb

## 2018-04-02 DIAGNOSIS — E782 Mixed hyperlipidemia: Secondary | ICD-10-CM

## 2018-04-02 DIAGNOSIS — I2510 Atherosclerotic heart disease of native coronary artery without angina pectoris: Secondary | ICD-10-CM

## 2018-04-02 LAB — BASIC METABOLIC PANEL
BUN / CREAT RATIO: 13 (ref 10–24)
BUN: 17 mg/dL (ref 8–27)
CHLORIDE: 101 mmol/L (ref 96–106)
CO2: 25 mmol/L (ref 20–29)
Calcium: 9.1 mg/dL (ref 8.6–10.2)
Creatinine, Ser: 1.35 mg/dL — ABNORMAL HIGH (ref 0.76–1.27)
GFR calc Af Amer: 59 mL/min/{1.73_m2} — ABNORMAL LOW (ref >59)
GFR calc non Af Amer: 51 mL/min/{1.73_m2} — ABNORMAL LOW (ref >59)
GLUCOSE: 111 mg/dL — AB (ref 65–99)
Potassium: 3.9 mmol/L (ref 3.5–5.2)
Sodium: 140 mmol/L (ref 134–144)

## 2018-04-02 LAB — HEPATIC FUNCTION PANEL
ALT: 14 IU/L (ref 0–44)
AST: 15 IU/L (ref 0–40)
Albumin: 4.2 g/dL (ref 3.5–4.8)
Alkaline Phosphatase: 65 IU/L (ref 39–117)
Bilirubin Total: 0.7 mg/dL (ref 0.0–1.2)
Bilirubin, Direct: 0.19 mg/dL (ref 0.00–0.40)
TOTAL PROTEIN: 6.2 g/dL (ref 6.0–8.5)

## 2018-04-02 LAB — LIPID PANEL
CHOL/HDL RATIO: 3.1 ratio (ref 0.0–5.0)
Cholesterol, Total: 109 mg/dL (ref 100–199)
HDL: 35 mg/dL — AB (ref >39)
LDL Calculated: 54 mg/dL (ref 0–99)
Triglycerides: 99 mg/dL (ref 0–149)
VLDL Cholesterol Cal: 20 mg/dL (ref 5–40)

## 2018-04-02 MED ORDER — NITROGLYCERIN 0.4 MG SL SUBL: 0.4000 mg | SUBLINGUAL_TABLET | SUBLINGUAL | 6 refills | Status: DC | PRN

## 2018-04-02 MED ORDER — ASPIRIN EC 81 MG PO TBEC: 81.0000 mg | DELAYED_RELEASE_TABLET | Freq: Every day | ORAL | Status: DC

## 2018-04-02 NOTE — Patient Instructions (Signed)
Medication Instructions:  Your physician has recommended you make the following change in your medication:   DECREASE Aspirin to 81 mg once daily   Labwork: TODAY - cholesterol, liver panel, basic metabolic panel   Testing/Procedures: None Ordered   Follow-Up: Your physician wants you to follow-up in: 1 year with Dr. Elease HashimotoNahser. You will receive a reminder letter in the mail two months in advance. If you don't receive a letter, please call our office to schedule the follow-up appointment.   If you need a refill on your cardiac medications before your next appointment, please call your pharmacy.   Thank you for choosing [facility]! Eligha BridegroomMichelle Mehul Rudin, RN [phone]

## 2018-04-02 NOTE — Progress Notes (Signed)
Cardiology Office Note   Date:  04/02/2018   ID:  Thomas [name], Thomas [name] 1940-12-24, MRN [id number]  PCP:  Kaleen Mask, MD  Cardiologist:   Kristeen Miss, MD   Chief Complaint  Patient presents with  . Coronary Artery Disease   Problem list: 1. Coronary artery disease-has a stent in his left anterior descending artery. The proximal edge of the stent hangs out into the left main. He's had thrombus formation on the proximal edge of the stent that embolized down the left circumflex artery when he was off Plavix. Because of this we have kept him on Plavix lifelong   2. Essential hypertension -   3. Hyperlipidemia-   Thomas [name] is a 77 year old gentleman with a history of coronary artery disease. He is status post PTCA and stenting of his left anterior descending artery. He's had thrombus formation on the proximal edge of the stent which extended out over the left circumflex artery. He's been on Plavix since that time.  He complains of some hip pain. He still smokes a few cigarettes a day.  February 26, 2014:  Since I last saw him 1 1/2 years ago, he has had hip surgery . I had recommneded that he not stop his plavix. Dr. Despina Hick opperated on him ON plavix and had no significant bleeding episodes.   He has not had any CP   February 26, 2015 Thomas [name] is a 77 y.o. male who presents for pre op visit for left hip replacement.  He had his last hip replacment WHILE on full dose Plavix and had no bleeding .  He has no CP or dyspnea.  He is limited by his hip problems.   February 29, 2016:  Doing well.   Had another hip replacement in May .  Had the surgery while on Plavix . No CP Stays active.  Walks a mile a day   February 28, 2017:  Thomas [name] is seen back today for f/u of his CAD No CP or dyspnea.  Exercising regularly   April 02, 2018:  Thomas [name] is seen today for follow up of his CAD , hypertension, hyperlipidemia. Walks some,  2 miles a day - perhaps twice a week ,    No CP o r dyspnea.   No dizziness.  He is on aspirin 325 mg a day.  We will have him decrease his dose to 81 mg a day. Also refill his nitroglycerin.  Past Medical History:  Diagnosis Date  . Arthritis   . Coronary artery disease    takes Plavix and ASA daily  . GERD (gastroesophageal reflux disease)    occasional and will take Tums if needed  . History of blood clots 2008   in chest  . History of kidney stones late 90's  . Hyperlipidemia    takes Simvastatin daily  . Hypertension    takes Maxzide daily  . Hypothyroidism    takes Synthroid daily  . Joint pain   . Joint swelling    left knee  . Pneumonia    at 77 years old, double    Past Surgical History:  Procedure Laterality Date  . CARDIAC CATHETERIZATION    . CORONARY ANGIOPLASTY  05/21/2007   lad stent/taxus stent  . HEMORROIDECTOMY  late 1970's  . HERNIA REPAIR Right 1980's  . TOTAL HIP ARTHROPLASTY Right 10/28/2013   Procedure: RIGHT TOTAL HIP ARTHROPLASTY ANTERIOR APPROACH;  Surgeon: Loanne Drilling, MD;  Location: [facility];  Service: Orthopedics;  Laterality:  Right;  [name]. TOTAL HIP ARTHROPLASTY Left 05/08/2015   Procedure: LEFT TOTAL HIP ARTHROPLASTY ANTERIOR APPROACH;  Surgeon: Ollen GrossFrank Aluisio, MD;  Location: [facility];  Service: Orthopedics;  Laterality: Left;     Current Outpatient Medications  Medication Sig Dispense Refill  . aspirin 325 MG tablet Take 325 mg by mouth daily.      . clopidogrel (PLAVIX) 75 MG tablet Take 75 mg by mouth daily with breakfast.    . levothyroxine (SYNTHROID, LEVOTHROID) 150 MCG tablet Take 150 mcg by mouth daily.    [name]. lisinopril (PRINIVIL,ZESTRIL) 20 MG tablet Take 10 mg by mouth daily.    . simvastatin (ZOCOR) 40 MG tablet Take 40 mg by mouth every evening.     No current facility-administered medications for this visit.     Allergies:   Oxycodone; Robaxin [methocarbamol]; and Tramadol    Social History:  The patient  reports that he has been smoking cigarettes.  He has a 13.00  pack-year smoking history. He has never used smokeless tobacco. He reports that he does not drink alcohol or use drugs.   Family History:  The patient's family history includes CAD in his mother; Heart failure in his father and mother; Pneumonia in his father.    ROS:  Noted in current history, otherwise review of systems is negative.  Physical Exam: Blood pressure 134/76, pulse 60, height 6' (1.829 m), weight 217 lb 6.4 oz (98.6 kg), SpO2 96 %.  GEN:  Well nourished, well developed in no acute distress HEENT: Normal NECK: No JVD; No carotid bruits LYMPHATICS: No lymphadenopathy CARDIAC: RRR  RESPIRATORY:  Clear to auscultation without rales, wheezing or rhonchi  ABDOMEN: Soft, non-tender, non-distended MUSCULOSKELETAL:  No edema; No deformity  SKIN: Warm and dry NEUROLOGIC:  Alert and oriented x 3   EKG:   April 02, 2018: Normal sinus rhythm at 60.  Right bundle branch block.   Recent Labs: No results found for requested labs within last 8760 hours.    Lipid Panel    Component Value Date/Time   CHOL 134 02/28/2017 0837   TRIG 93 02/28/2017 0837   HDL 38 (L) 02/28/2017 0837   CHOLHDL 3.5 02/28/2017 0837   CHOLHDL 4.1 02/29/2016 0839   VLDL 36 (H) 02/29/2016 0839   LDLCALC 77 02/28/2017 0837      Wt Readings from Last 3 Encounters:  04/02/18 217 lb 6.4 oz (98.6 kg)  02/28/17 222 lb (100.7 kg)  02/29/16 219 lb (99.3 kg)      Other studies Reviewed: Additional studies/ records that were reviewed today include: . Review of the above records demonstrates:    ASSESSMENT AND PLAN:  1. Coronary artery disease-has a stent in his left anterior descending artery. The proximal edge of the stent hangs out into the left main. He's had thrombus formation on the proximal edge of the stent that embolized down the left circumflex artery when he was off Plavix. Because of this we have kept him on Plavix lifelong   He has had 2 hip replacements while on Plavix and did very  well.   He should remain on Plavix lifelong .    He is doing very well.  Is not had any angina.  We will have him decrease his aspirin to 81 mg a day.  He should continue Plavix lifelong.  2. Essential hypertension -blood pressures well controlled.  Continue current medications.   3. Hyperlipidemia -fasting labs today.  4. Osteoarthritis   Current medicines are reviewed at length  with the patient today.  The patient does not have concerns regarding medicines.  The following changes have been made:  no change  Labs/ tests ordered today include:  No orders of the defined types were placed in this encounter.    Disposition:   FU with me in 1 year    Signed, Kristeen Miss, MD  04/02/2018 9:11 AM    [facility] [address] [place], [place], [place]  [address] Phone: [phone]; Fax: [phone]

## 2018-06-04 ENCOUNTER — Inpatient Hospital Stay (HOSPITAL_COMMUNITY)
Admission: EM | Admit: 2018-06-04 | Discharge: 2018-06-08 | DRG: 417 | Disposition: A | Discharge status: Home / Self Care | Payer: Medicare Other | Attending: Family Medicine | Admitting: Family Medicine

## 2018-06-04 ENCOUNTER — Emergency Department (HOSPITAL_COMMUNITY): Payer: Medicare Other

## 2018-06-04 ENCOUNTER — Other Ambulatory Visit: Payer: Self-pay

## 2018-06-04 ENCOUNTER — Encounter (HOSPITAL_COMMUNITY): Payer: Self-pay | Admitting: Emergency Medicine

## 2018-06-04 DIAGNOSIS — I2510 Atherosclerotic heart disease of native coronary artery without angina pectoris: Secondary | ICD-10-CM | POA: Diagnosis present

## 2018-06-04 DIAGNOSIS — Z955 Presence of coronary angioplasty implant and graft: Secondary | ICD-10-CM

## 2018-06-04 DIAGNOSIS — Z01810 Encounter for preprocedural cardiovascular examination: Secondary | ICD-10-CM

## 2018-06-04 DIAGNOSIS — Z79899 Other long term (current) drug therapy: Secondary | ICD-10-CM | POA: Diagnosis not present

## 2018-06-04 DIAGNOSIS — Z87442 Personal history of urinary calculi: Secondary | ICD-10-CM

## 2018-06-04 DIAGNOSIS — F17210 Nicotine dependence, cigarettes, uncomplicated: Secondary | ICD-10-CM | POA: Diagnosis present

## 2018-06-04 DIAGNOSIS — Z7902 Long term (current) use of antithrombotics/antiplatelets: Secondary | ICD-10-CM | POA: Diagnosis not present

## 2018-06-04 DIAGNOSIS — N183 Chronic kidney disease, stage 3 (moderate): Secondary | ICD-10-CM | POA: Diagnosis present

## 2018-06-04 DIAGNOSIS — K8020 Calculus of gallbladder without cholecystitis without obstruction: Secondary | ICD-10-CM | POA: Diagnosis present

## 2018-06-04 DIAGNOSIS — K8500 Idiopathic acute pancreatitis without necrosis or infection: Secondary | ICD-10-CM | POA: Diagnosis not present

## 2018-06-04 DIAGNOSIS — Z96643 Presence of artificial hip joint, bilateral: Secondary | ICD-10-CM | POA: Diagnosis present

## 2018-06-04 DIAGNOSIS — N179 Acute kidney failure, unspecified: Secondary | ICD-10-CM | POA: Diagnosis present

## 2018-06-04 DIAGNOSIS — E785 Hyperlipidemia, unspecified: Secondary | ICD-10-CM | POA: Diagnosis present

## 2018-06-04 DIAGNOSIS — E860 Dehydration: Secondary | ICD-10-CM | POA: Diagnosis present

## 2018-06-04 DIAGNOSIS — K8000 Calculus of gallbladder with acute cholecystitis without obstruction: Secondary | ICD-10-CM | POA: Diagnosis present

## 2018-06-04 DIAGNOSIS — Z86718 Personal history of other venous thrombosis and embolism: Secondary | ICD-10-CM

## 2018-06-04 DIAGNOSIS — I129 Hypertensive chronic kidney disease with stage 1 through stage 4 chronic kidney disease, or unspecified chronic kidney disease: Secondary | ICD-10-CM | POA: Diagnosis present

## 2018-06-04 DIAGNOSIS — I10 Essential (primary) hypertension: Secondary | ICD-10-CM | POA: Diagnosis present

## 2018-06-04 DIAGNOSIS — R109 Unspecified abdominal pain: Secondary | ICD-10-CM | POA: Diagnosis present

## 2018-06-04 DIAGNOSIS — Z7982 Long term (current) use of aspirin: Secondary | ICD-10-CM | POA: Diagnosis not present

## 2018-06-04 DIAGNOSIS — E039 Hypothyroidism, unspecified: Secondary | ICD-10-CM | POA: Diagnosis present

## 2018-06-04 DIAGNOSIS — E876 Hypokalemia: Secondary | ICD-10-CM | POA: Diagnosis present

## 2018-06-04 DIAGNOSIS — Z419 Encounter for procedure for purposes other than remedying health state, unspecified: Secondary | ICD-10-CM

## 2018-06-04 DIAGNOSIS — K859 Acute pancreatitis without necrosis or infection, unspecified: Secondary | ICD-10-CM | POA: Diagnosis present

## 2018-06-04 DIAGNOSIS — R112 Nausea with vomiting, unspecified: Secondary | ICD-10-CM

## 2018-06-04 DIAGNOSIS — K219 Gastro-esophageal reflux disease without esophagitis: Secondary | ICD-10-CM | POA: Diagnosis present

## 2018-06-04 DIAGNOSIS — B179 Acute viral hepatitis, unspecified: Secondary | ICD-10-CM | POA: Diagnosis present

## 2018-06-04 LAB — COMPREHENSIVE METABOLIC PANEL
ALK PHOS: 61 U/L (ref 38–126)
ALT: 27 U/L (ref 17–63)
AST: 38 U/L (ref 15–41)
Albumin: 4.1 g/dL (ref 3.5–5.0)
Anion gap: 13 (ref 5–15)
BUN: 17 mg/dL (ref 6–20)
CALCIUM: 9.5 mg/dL (ref 8.9–10.3)
CO2: 25 mmol/L (ref 22–32)
CREATININE: 1.81 mg/dL — AB (ref 0.61–1.24)
Chloride: 106 mmol/L (ref 101–111)
GFR calc non Af Amer: 35 mL/min — ABNORMAL LOW (ref >60)
GFR, EST AFRICAN AMERICAN: 40 mL/min — AB (ref >60)
Glucose, Bld: 116 mg/dL — ABNORMAL HIGH (ref 65–99)
Potassium: 3.6 mmol/L (ref 3.5–5.1)
Sodium: 144 mmol/L (ref 135–145)
Total Bilirubin: 1.6 mg/dL — ABNORMAL HIGH (ref 0.3–1.2)
Total Protein: 6.7 g/dL (ref 6.5–8.1)

## 2018-06-04 LAB — CBC WITH DIFFERENTIAL/PLATELET
Abs Immature Granulocytes: 0 10*3/uL (ref 0.0–0.1)
BASOS ABS: 0.1 10*3/uL (ref 0.0–0.1)
Basophils Relative: 1 %
EOS ABS: 0.1 10*3/uL (ref 0.0–0.7)
Eosinophils Relative: 1 %
HCT: 46.4 % (ref 39.0–52.0)
Hemoglobin: 14.5 g/dL (ref 13.0–17.0)
Immature Granulocytes: 0 %
Lymphocytes Relative: 12 %
Lymphs Abs: 1.3 10*3/uL (ref 0.7–4.0)
MCH: 33.3 pg (ref 26.0–34.0)
MCHC: 31.3 g/dL (ref 30.0–36.0)
MCV: 106.7 fL — ABNORMAL HIGH (ref 78.0–100.0)
Monocytes Absolute: 0.9 10*3/uL (ref 0.1–1.0)
Monocytes Relative: 8 %
Neutro Abs: 8.9 10*3/uL — ABNORMAL HIGH (ref 1.7–7.7)
Neutrophils Relative %: 78 %
Platelets: 247 10*3/uL (ref 150–400)
RBC: 4.35 MIL/uL (ref 4.22–5.81)
RDW: 13.3 % (ref 11.5–15.5)
WBC: 11.3 10*3/uL — ABNORMAL HIGH (ref 4.0–10.5)

## 2018-06-04 LAB — LIPASE, BLOOD: LIPASE: 250 U/L — AB (ref 11–51)

## 2018-06-04 LAB — I-STAT TROPONIN, ED: Troponin i, poc: 0 ng/mL (ref 0.00–0.08)

## 2018-06-04 LAB — PROTIME-INR
INR: 1.11
Prothrombin Time: 14.3 seconds (ref 11.4–15.2)

## 2018-06-04 MED ORDER — IOHEXOL 300 MG/ML  SOLN
100.0000 mL | Freq: Once | INTRAMUSCULAR | Status: AC | PRN
  Administered 2018-06-04: 80 mL via INTRAVENOUS

## 2018-06-04 MED ORDER — SODIUM CHLORIDE 0.9 % IV BOLUS
1000.0000 mL | Freq: Once | INTRAVENOUS | Status: AC
  Administered 2018-06-04: 1000 mL via INTRAVENOUS

## 2018-06-04 MED ORDER — ONDANSETRON HCL 4 MG PO TABS: 4.0000 mg | ORAL_TABLET | Freq: Four times a day (QID) | ORAL | Status: DC | PRN

## 2018-06-04 MED ORDER — ACETAMINOPHEN 650 MG RE SUPP
650.0000 mg | Freq: Four times a day (QID) | RECTAL | Status: DC | PRN
Start: 2018-06-04 — End: 2018-06-08

## 2018-06-04 MED ORDER — ONDANSETRON HCL 4 MG/2ML IJ SOLN: 4.0000 mg | Freq: Four times a day (QID) | INTRAMUSCULAR | Status: DC | PRN

## 2018-06-04 MED ORDER — FENTANYL CITRATE (PF) 100 MCG/2ML IJ SOLN
50.0000 ug | Freq: Once | INTRAMUSCULAR | Status: AC
  Administered 2018-06-04: 50 ug via INTRAVENOUS
  Filled 2018-06-04: qty 2

## 2018-06-04 MED ORDER — SODIUM CHLORIDE 0.9 % IV SOLN
INTRAVENOUS | Status: DC
  Administered 2018-06-05: 75 mL/h via INTRAVENOUS
  Administered 2018-06-05: via INTRAVENOUS

## 2018-06-04 MED ORDER — ACETAMINOPHEN 325 MG PO TABS
650.0000 mg | ORAL_TABLET | Freq: Four times a day (QID) | ORAL | Status: DC | PRN
Start: 2018-06-04 — End: 2018-06-08

## 2018-06-04 MED ORDER — HYDRALAZINE HCL 20 MG/ML IJ SOLN: 10.0000 mg | INTRAMUSCULAR | Status: DC | PRN

## 2018-06-04 MED ORDER — ONDANSETRON HCL 4 MG/2ML IJ SOLN
4.0000 mg | Freq: Once | INTRAMUSCULAR | Status: AC
  Administered 2018-06-04: 4 mg via INTRAVENOUS
  Filled 2018-06-04: qty 2

## 2018-06-04 MED ORDER — FENTANYL CITRATE (PF) 100 MCG/2ML IJ SOLN
25.0000 ug | INTRAMUSCULAR | Status: DC | PRN
  Filled 2018-06-04: qty 2

## 2018-06-04 MED ORDER — FAMOTIDINE IN NACL 20-0.9 MG/50ML-% IV SOLN
20.0000 mg | Freq: Once | INTRAVENOUS | Status: AC
  Administered 2018-06-04: 20 mg via INTRAVENOUS
  Filled 2018-06-04: qty 50

## 2018-06-04 NOTE — H&P (Signed)
History and Physical    Thomas [name] ZOX:[id number] DOB: 10/20/1941 DOA: 06/04/2018  PCP: Kaleen MaskElkins, Wilson Oliver, MD  Patient coming from: Home.  Chief Complaint: Epigastric pain.  HPI: Thomas [name] is a 77 y.o. male with history of CAD, hypertension, hypothyroidism presents to the ER with complaints of severe epigastric pain since 3 PM today.  Patient had some food at 10 AM and had helped his neighbor with fixing things.  At around 3 PM patient started developing severe epigastric pain radiating to his back with multiple episodes of vomiting.  Denies any diarrhea.  Denies any chest pain or shortness of breath.  Denies any blood in the vomitus.  ED Course: In the ER labs revealed a lipase of 250.  CT abdomen pelvis with contrast shows cholelithiasis but no evidence of any pancreatitis.  Patient's pain improved with pain relief medications.  On-call gastroenterologist Dr. Rhea BeltonPyrtle was consulted by the ER physician and had recommended getting MRCP.  Based on MRCP for the plan.  EKG shows normal sinus rhythm with RBBB and troponin was negative.  Review of Systems: As per HPI, rest all negative.   Past Medical History:  Diagnosis Date  . Arthritis   . Coronary artery disease    takes Plavix and ASA daily  . GERD (gastroesophageal reflux disease)    occasional and will take Tums if needed  . History of blood clots 2008   in chest  . History of kidney stones late 90's  . Hyperlipidemia    takes Simvastatin daily  . Hypertension    takes Maxzide daily  . Hypothyroidism    takes Synthroid daily  . Joint pain   . Joint swelling    left knee  . Pneumonia    at 77 years old, double    Past Surgical History:  Procedure Laterality Date  . CARDIAC CATHETERIZATION    . CORONARY ANGIOPLASTY  05/21/2007   lad stent/taxus stent  . HEMORROIDECTOMY  late 1970's  . HERNIA REPAIR Right 1980's  . TOTAL HIP ARTHROPLASTY Right 10/28/2013   Procedure: RIGHT TOTAL HIP ARTHROPLASTY ANTERIOR  APPROACH;  Surgeon: Loanne DrillingFrank V Aluisio, MD;  Location: [facility];  Service: Orthopedics;  Laterality: Right;  . TOTAL HIP ARTHROPLASTY Left 05/08/2015   Procedure: LEFT TOTAL HIP ARTHROPLASTY ANTERIOR APPROACH;  Surgeon: Ollen GrossFrank Aluisio, MD;  Location: [facility];  Service: Orthopedics;  Laterality: Left;     reports that he has been smoking cigarettes.  He has a 13.00 pack-year smoking history. He has never used smokeless tobacco. He reports that he does not drink alcohol or use drugs.  Allergies  Allergen Reactions  . Oxycodone Nausea Only  . Robaxin [Methocarbamol] Nausea Only  . Tramadol Nausea Only and Other (See Comments)    Hallucinations    Family History  Problem Relation Age of Onset  . CAD Mother   . Heart failure Mother   . Pneumonia Father   . Heart failure Father     Prior to Admission medications   Medication Sig Start Date End Date Taking? Authorizing Provider  aspirin EC 81 MG tablet Take 1 tablet (81 mg total) by mouth daily. 04/02/18  Yes Nahser, Deloris PingPhilip J, MD  clopidogrel (PLAVIX) 75 MG tablet Take 75 mg by mouth daily with breakfast.   Yes [provider]  levothyroxine (SYNTHROID, LEVOTHROID) 150 MCG tablet Take 150 mcg by mouth daily. 01/04/18  Yes [provider]  lisinopril (PRINIVIL,ZESTRIL) 20 MG tablet Take 10 mg by mouth daily.  Yes [provider]  nitroGLYCERIN (NITROSTAT) 0.4 MG SL tablet Place 1 tablet (0.4 mg total) under the tongue every 5 (five) minutes as needed for chest pain. 04/02/18 07/01/18 Yes Nahser, Deloris Ping, MD  ranitidine (ZANTAC) 150 MG tablet Take 150 mg by mouth 2 (two) times daily as needed for heartburn.   Yes [provider]  simvastatin (ZOCOR) 40 MG tablet Take 40 mg by mouth every evening.   Yes [provider]    Physical Exam: Vitals:   06/04/18 2130 06/04/18 2215 06/04/18 2247 06/04/18 2255  BP: (!) 145/77 (!) 144/74  (!) 147/87  Pulse: 68 65  63  Resp:    18  Temp:    97.8 F (36.6 C)    TempSrc:    Oral  SpO2: 96% 96%  96%  Weight:   95.7 kg (211 lb)   Height:   6' (1.829 m)       Constitutional: Moderately built and nourished. Vitals:   06/04/18 2130 06/04/18 2215 06/04/18 2247 06/04/18 2255  BP: (!) 145/77 (!) 144/74  (!) 147/87  Pulse: 68 65  63  Resp:    18  Temp:    97.8 F (36.6 C)  TempSrc:    Oral  SpO2: 96% 96%  96%  Weight:   95.7 kg (211 lb)   Height:   6' (1.829 m)    Eyes: Anicteric no pallor. ENMT: No discharge from the ears eyes nose or mouth. Neck: No mass felt.  No neck rigidity. Respiratory: No rhonchi or crepitations. Cardiovascular: S1-S2 heard no murmurs appreciated. Abdomen: Soft nontender bowel sounds present. Musculoskeletal: No edema.  No joint effusion. Skin: No rash.  Skin appears warm. Neurologic: Alert awake oriented to time place and person.  Moves all extremities. Psychiatric: Appears normal.  Normal affect.   Labs on Admission: I have personally reviewed following labs and imaging studies  CBC: Recent Labs  Lab 06/04/18 1745  WBC 11.3*  NEUTROABS 8.9*  HGB 14.5  HCT 46.4  MCV 106.7*  PLT 247   Basic Metabolic Panel: Recent Labs  Lab 06/04/18 1745  NA 144  K 3.6  CL 106  CO2 25  GLUCOSE 116*  BUN 17  CREATININE 1.81*  CALCIUM 9.5   GFR: Estimated Creatinine Clearance: 41.6 mL/min (A) (by C-G formula based on SCr of 1.81 mg/dL (H)). Liver Function Tests: Recent Labs  Lab 06/04/18 1745  AST 38  ALT 27  ALKPHOS 61  BILITOT 1.6*  PROT 6.7  ALBUMIN 4.1   Recent Labs  Lab 06/04/18 1745  LIPASE 250*   No results for input(s): AMMONIA in the last 168 hours. Coagulation Profile: Recent Labs  Lab 06/04/18 1745  INR 1.11   Cardiac Enzymes: No results for input(s): CKTOTAL, CKMB, CKMBINDEX, TROPONINI in the last 168 hours. BNP (last 3 results) No results for input(s): PROBNP in the last 8760 hours. HbA1C: No results for input(s): HGBA1C in the last 72 hours. CBG: No results for input(s):  GLUCAP in the last 168 hours. Lipid Profile: No results for input(s): CHOL, HDL, LDLCALC, TRIG, CHOLHDL, LDLDIRECT in the last 72 hours. Thyroid Function Tests: No results for input(s): TSH, T4TOTAL, FREET4, T3FREE, THYROIDAB in the last 72 hours. Anemia Panel: No results for input(s): VITAMINB12, FOLATE, FERRITIN, TIBC, IRON, RETICCTPCT in the last 72 hours. Urine analysis:    Component Value Date/Time   COLORURINE YELLOW 04/27/2015 1033   APPEARANCEUR CLEAR 04/27/2015 1033   LABSPEC 1.018 04/27/2015 1033   PHURINE  8.0 04/27/2015 1033   GLUCOSEU NEGATIVE 04/27/2015 1033   HGBUR NEGATIVE 04/27/2015 1033   BILIRUBINUR NEGATIVE 04/27/2015 1033   KETONESUR NEGATIVE 04/27/2015 1033   PROTEINUR NEGATIVE 04/27/2015 1033   UROBILINOGEN 0.2 04/27/2015 1033   NITRITE NEGATIVE 04/27/2015 1033   LEUKOCYTESUR NEGATIVE 04/27/2015 1033   Sepsis Labs: @LABRCNTIP (procalcitonin:4,lacticidven:4) )No results found for this or any previous visit (from the past 240 hour(s)).   Radiological Exams on Admission: Ct Abdomen Pelvis W Contrast  Result Date: 06/04/2018 CLINICAL DATA:  77 y/o M; epigastric abdominal pain with nausea vomiting. EXAM: CT ABDOMEN AND PELVIS WITH CONTRAST TECHNIQUE: Multidetector CT imaging of the abdomen and pelvis was performed using the standard protocol following bolus administration of intravenous contrast. CONTRAST:  80mL OMNIPAQUE IOHEXOL 300 MG/ML  SOLN COMPARISON:  None. FINDINGS: Lower chest: Moderate coronary artery calcific atherosclerosis. 3-4 mm right middle lobe calcified granuloma. Hepatobiliary: No focal liver abnormality is seen. Cholelithiasis. No gallbladder wall thickening or biliary ductal dilatation. Pancreas: Unremarkable. No pancreatic ductal dilatation or surrounding inflammatory changes. Spleen: Normal in size without focal abnormality. Adrenals/Urinary Tract: 13 mm right kidney interpolar cyst and subcentimeter left kidney upper pole cyst. Multiple  nonobstructing right-sided kidney stones measuring up to 5 mm. No hydronephrosis. Normal bladder. Stomach/Bowel: Stomach is within normal limits. Appendix appears normal. No evidence of bowel wall thickening, distention, or inflammatory changes. Sigmoid diverticulosis without findings of acute diverticulitis. 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 Calcific aortic atherosclerosis. Reproductive: Obscured by streak artifact from hip hardware. Other: No abdominal wall hernia or abnormality. No abdominopelvic ascites. Musculoskeletal: Bilateral hip arthroplasty. Advanced multilevel disc and facet degenerative changes of the spine. 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 IMPRESSION: 1. No acute process identified. 2. Coronary and aortic calcific atherosclerosis. 3. Cholelithiasis. 4. Multiple nonobstructing right kidney stones measuring up 5 mm. 5. 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 6. 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 Recommend followup by ultrasound in 5 years. This recommendation follows ACR consensus guidelines: White Paper of the ACR Incidental Findings Committee II on Vascular Findings. J Am Coll Radiol 2013; 10:789-794. Electronically Signed   By: Mitzi Hansen M.D.   On: 06/04/2018 20:38   Dg Abd Acute W/chest  Result Date: 06/04/2018 CLINICAL DATA:  77 y/o M; she central chest pain and epigastric pain. Radiates to the back. Vomiting. EXAM: DG ABDOMEN ACUTE W/ 1V CHEST COMPARISON:  07/30/2015 chest radiograph FINDINGS: There is no evidence of dilated bowel loops or free intraperitoneal air. No radiopaque calculi or other significant radiographic abnormality is seen. Heart size and mediastinal contours are within normal limits. Both lungs are clear. Partially visualized hip prostheses. Degenerative changes of the spine with mild thoracolumbar dextrocurvature.  IMPRESSION: Negative abdominal radiographs.  No acute cardiopulmonary disease. Electronically Signed   By: Mitzi Hansen M.D.   On: 06/04/2018 18:53    EKG: Independently reviewed.  Normal sinus rhythm with RBBB.  Assessment/Plan Principal Problem:   Acute pancreatitis Active Problems:   CAD (coronary artery disease)   HTN (hypertension)   Abdominal pain   ARF (acute renal failure) (HCC)   Gallstones    1. Acute pancreatitis -since CAT scan shows cholelithiasis primary concern for any CBD obstruction.  MRCP has been ordered patient is kept n.p.o.  Will check triglycerides.  Patient denies drinking alcohol.  We will cycle cardiac markers.  If MRCP shows obstructing stone please reconsult gastroenterologist Dr. Rhea Belton in  a.m.  Pain relief medications.  IV hydration. 2. CAD -we will cycle cardiac markers. 3. Hyperlipidemia on statins. 4. Acute renal failure likely from vomiting -follow metabolic panel closely.  Continue hydration.  Holding lisinopril for now. 5. Hypothyroidism on IV Synthroid until patient can take orally.  6. Hypertension -since patient is n.p.o. patient has been placed on PRN IV hydralazine.   DVT prophylaxis: SCDs. Code Status: Full code. Family Communication: Discussed with patient. Disposition Plan: Home. Consults called: ER physician discussed with gastroenterologist Dr. Rhea Belton. Admission status: Inpatient.   Eduard Clos MD [facility] Pager [phone].  If 7PM-7AM, please contact night-coverage [email or web address] Password [facility]  06/04/2018, 11:48 PM

## 2018-06-04 NOTE — ED Notes (Signed)
Patient returned from CT

## 2018-06-04 NOTE — Progress Notes (Signed)
Patient arrived to [address] from ED. Patient alert and oriented. Admission assessment complete. CCMD notified. Patient oriented to room and updated on plan of care. Awaiting orders. No other complaints at this time. Will continue to monitor patient.

## 2018-06-04 NOTE — ED Provider Notes (Signed)
[facility]   CSN: [id number] Arrival date & time: 06/04/18  1655  History   Chief Complaint Chief Complaint  Patient presents with  . Abdominal Pain    HPI Thomas [name] is a 77 y.o. male.  The history is provided by the patient.  Abdominal Pain   This is a new problem. The current episode started 1 to 2 hours ago. The problem occurs constantly. The problem has been gradually worsening. Associated with: occured 4 hours after eating. The pain is located in the epigastric region. The quality of the pain is aching and burning. The pain is at a severity of 10/10. The pain is severe. Associated symptoms include nausea and vomiting. Pertinent negatives include fever, diarrhea, dysuria, hematuria and arthralgias. The symptoms are aggravated by palpation. Nothing relieves the symptoms. His past medical history is significant for GERD.    Past Medical History:  Diagnosis Date  . Arthritis   . Coronary artery disease    takes Plavix and ASA daily  . GERD (gastroesophageal reflux disease)    occasional and will take Tums if needed  . History of blood clots 2008   in chest  . History of kidney stones late 90's  . Hyperlipidemia    takes Simvastatin daily  . Hypertension    takes Maxzide daily  . Hypothyroidism    takes Synthroid daily  . Joint pain   . Joint swelling    left knee  . Pneumonia    at 77 years old, double    Patient Active Problem List   Diagnosis Date Noted  . Abdominal pain 06/04/2018  . Acute pancreatitis 06/04/2018  . ARF (acute renal failure) (HCC) 06/04/2018  . Gallstones 06/04/2018  . Hypokalemia 10/30/2013  . Postoperative anemia due to acute blood loss 10/29/2013  . OA (osteoarthritis) of hip 10/28/2013  . CAD (coronary artery disease) 05/13/2011  . HTN (hypertension) 05/13/2011  . Hyperlipidemia 05/13/2011    Past Surgical History:  Procedure Laterality Date  . CARDIAC CATHETERIZATION      . CORONARY ANGIOPLASTY  05/21/2007   lad stent/taxus stent  . HEMORROIDECTOMY  late 1970's  . HERNIA REPAIR Right 1980's  . TOTAL HIP ARTHROPLASTY Right 10/28/2013   Procedure: RIGHT TOTAL HIP ARTHROPLASTY ANTERIOR APPROACH;  Surgeon: Loanne DrillingFrank V Aluisio, MD;  Location: [facility] ORS;  Service: Orthopedics;  Laterality: Right;  . TOTAL HIP ARTHROPLASTY Left 05/08/2015   Procedure: LEFT TOTAL HIP ARTHROPLASTY ANTERIOR APPROACH;  Surgeon: Ollen GrossFrank Aluisio, MD;  Location: [facility] OR;  Service: Orthopedics;  Laterality: Left;        Home Medications    Prior to Admission medications   Medication Sig Start Date End Date Taking? Authorizing Provider  aspirin EC 81 MG tablet Take 1 tablet (81 mg total) by mouth daily. 04/02/18  Yes Nahser, Deloris PingPhilip J, MD  clopidogrel (PLAVIX) 75 MG tablet Take 75 mg by mouth daily with breakfast.   Yes [provider]  levothyroxine (SYNTHROID, LEVOTHROID) 150 MCG tablet Take 150 mcg by mouth daily. 01/04/18  Yes [provider]  lisinopril (PRINIVIL,ZESTRIL) 20 MG tablet Take 10 mg by mouth daily.   Yes [provider]  nitroGLYCERIN (NITROSTAT) 0.4 MG SL tablet Place 1 tablet (0.4 mg total) under the tongue every 5 (five) minutes as needed for chest pain. 04/02/18 07/01/18 Yes Nahser, Deloris PingPhilip J, MD  ranitidine (ZANTAC) 150 MG tablet Take 150 mg by mouth 2 (two) times daily as needed for heartburn.  Yes [provider]  simvastatin (ZOCOR) 40 MG tablet Take 40 mg by mouth every evening.   Yes [provider]    Family History Family History  Problem Relation Age of Onset  . CAD Mother   . Heart failure Mother   . Pneumonia Father   . Heart failure Father     Social History Social History   Tobacco Use  . Smoking status: Current Every Day Smoker    Packs/day: 0.25    Years: 52.00    Pack years: 13.00    Types: Cigarettes  . Smokeless tobacco: Never Used  . Tobacco comment: trying to quit  Substance Use Topics  . Alcohol use:  No  . Drug use: No     Allergies   Oxycodone; Robaxin [methocarbamol]; and Tramadol   Review of Systems Review of Systems  Constitutional: Negative for chills and fever.  HENT: Negative for ear pain and sore throat.   Eyes: Negative for pain and visual disturbance.  Respiratory: Positive for shortness of breath. Negative for cough.   Cardiovascular: Negative for chest pain and palpitations.  Gastrointestinal: Positive for abdominal pain, nausea and vomiting. Negative for diarrhea.  Genitourinary: Negative for dysuria and hematuria.  Musculoskeletal: Negative for arthralgias and back pain.  Skin: Negative for color change and rash.  Neurological: Negative for seizures and syncope.  All other systems reviewed and are negative.    Physical Exam Updated Vital Signs BP (!) 147/87 (BP Location: Left Arm)   Pulse 63   Temp 97.8 F (36.6 C) (Oral)   Resp 18   Ht 6' (1.829 m)   Wt 95.7 kg (211 lb)   SpO2 96%   BMI 28.62 kg/m   Physical Exam  Constitutional: He is oriented to person, place, and time. He appears well-developed and well-nourished.  HENT:  Head: Normocephalic and atraumatic.  Mouth/Throat: Oropharynx is clear and moist.  Eyes: Conjunctivae and EOM are normal.  Neck: Neck supple.  Cardiovascular: Normal rate, regular rhythm and intact distal pulses.  No murmur heard. Pulmonary/Chest: Effort normal and breath sounds normal. No stridor. No respiratory distress.  Abdominal: Soft. He exhibits no distension. There is tenderness in the epigastric area. There is guarding. There is no rigidity.  Musculoskeletal: He exhibits no edema.  Neurological: He is alert and oriented to person, place, and time.  Skin: Skin is warm and dry.  Psychiatric: He has a normal mood and affect.  Nursing note and vitals reviewed.    ED Treatments / Results  Labs (all labs ordered are listed, but only abnormal results are displayed) Labs Reviewed  COMPREHENSIVE METABOLIC PANEL -  Abnormal; Notable for the following components:      Result Value   Glucose, Bld 116 (*)    Creatinine, Ser 1.81 (*)    Total Bilirubin 1.6 (*)    GFR calc non Af Amer 35 (*)    GFR calc Af Amer 40 (*)    All other components within normal limits  LIPASE, BLOOD - Abnormal; Notable for the following components:   Lipase 250 (*)    All other components within normal limits  CBC WITH DIFFERENTIAL/PLATELET - Abnormal; Notable for the following components:   WBC 11.3 (*)    MCV 106.7 (*)    Neutro Abs 8.9 (*)    All other components within normal limits  BASIC METABOLIC PANEL - Abnormal; Notable for the following components:   Glucose, Bld 107 (*)    Creatinine, Ser 1.65 (*)  Calcium 8.8 (*)    GFR calc non Af Amer 39 (*)    GFR calc Af Amer 45 (*)    All other components within normal limits  CBC - Abnormal; Notable for the following components:   RBC 3.91 (*)    MCV 104.9 (*)    All other components within normal limits  HEPATIC FUNCTION PANEL - Abnormal; Notable for the following components:   Total Protein 5.8 (*)    AST 62 (*)    ALT 55 (*)    All other components within normal limits  PROTIME-INR  TROPONIN I  TRIGLYCERIDES  GLUCOSE, CAPILLARY  I-STAT TROPONIN, ED    EKG EKG Interpretation  Date/Time:  Monday June 04 2018 19:08:01 EDT Ventricular Rate:  75 PR Interval:    QRS Duration: 146 QT Interval:  409 QTC Calculation: 457 R Axis:   -33 Text Interpretation:  Sinus rhythm Right bundle branch block No significant change since last tracing Confirmed by Melene Plan [phone]) on 06/04/2018 7:45:27 PM   Radiology Ct Abdomen Pelvis W Contrast  Result Date: 06/04/2018 CLINICAL DATA:  77 y/o M; epigastric abdominal pain with nausea vomiting. EXAM: CT ABDOMEN AND PELVIS WITH CONTRAST TECHNIQUE: Multidetector CT imaging of the abdomen and pelvis was performed using the standard protocol following bolus administration of intravenous contrast. CONTRAST:  80mL OMNIPAQUE  IOHEXOL 300 MG/ML  SOLN COMPARISON:  None. FINDINGS: Lower chest: Moderate coronary artery calcific atherosclerosis. 3-4 mm right middle lobe calcified granuloma. Hepatobiliary: No focal liver abnormality is seen. Cholelithiasis. No gallbladder wall thickening or biliary ductal dilatation. Pancreas: Unremarkable. No pancreatic ductal dilatation or surrounding inflammatory changes. Spleen: Normal in size without focal abnormality. Adrenals/Urinary Tract: 13 mm right kidney interpolar cyst and subcentimeter left kidney upper pole cyst. Multiple nonobstructing right-sided kidney stones measuring up to 5 mm. No hydronephrosis. Normal bladder. Stomach/Bowel: Stomach is within normal limits. Appendix appears normal. No evidence of bowel wall thickening, distention, or inflammatory changes. Sigmoid diverticulosis without findings of acute diverticulitis. 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 Calcific aortic atherosclerosis. Reproductive: Obscured by streak artifact from hip hardware. Other: No abdominal wall hernia or abnormality. No abdominopelvic ascites. Musculoskeletal: Bilateral hip arthroplasty. Advanced multilevel disc and facet degenerative changes of the spine. 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 IMPRESSION: 1. No acute process identified. 2. Coronary and aortic calcific atherosclerosis. 3. Cholelithiasis. 4. Multiple nonobstructing right kidney stones measuring up 5 mm. 5. 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 6. 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 Recommend followup by ultrasound in 5 years. This recommendation follows ACR consensus guidelines: White Paper of the ACR Incidental Findings Committee II on Vascular Findings. J Am Coll Radiol 2013; 10:789-794. Electronically Signed   By: Mitzi Hansen M.D.   On: 06/04/2018 20:38   Dg Abd Acute W/chest  Result Date:  06/04/2018 CLINICAL DATA:  77 y/o M; she central chest pain and epigastric pain. Radiates to the back. Vomiting. EXAM: DG ABDOMEN ACUTE W/ 1V CHEST COMPARISON:  07/30/2015 chest radiograph FINDINGS: There is no evidence of dilated bowel loops or free intraperitoneal air. No radiopaque calculi or other significant radiographic abnormality is seen. Heart size and mediastinal contours are within normal limits. Both lungs are clear. Partially visualized hip prostheses. Degenerative changes of the spine with mild thoracolumbar dextrocurvature. IMPRESSION: Negative abdominal radiographs.  No acute cardiopulmonary disease. Electronically Signed   By: Buzzy Han.D.  On: 06/04/2018 18:53    Procedures Procedures (including critical care time)  Medications Ordered in ED Medications  acetaminophen (TYLENOL) tablet 650 mg (has no administration in time range)    Or  acetaminophen (TYLENOL) suppository 650 mg (has no administration in time range)  ondansetron (ZOFRAN) tablet 4 mg (has no administration in time range)    Or  ondansetron (ZOFRAN) injection 4 mg (has no administration in time range)  0.9 %  sodium chloride infusion ( Intravenous Stopped 06/05/18 0153)  hydrALAZINE (APRESOLINE) injection 10 mg (has no administration in time range)  fentaNYL (SUBLIMAZE) injection 25 mcg (has no administration in time range)  fentaNYL (SUBLIMAZE) injection 50 mcg (50 mcg Intravenous Given 06/04/18 1805)  sodium chloride 0.9 % bolus 1,000 mL (0 mLs Intravenous Stopped 06/04/18 2021)  ondansetron (ZOFRAN) injection 4 mg (4 mg Intravenous Given 06/04/18 1806)  famotidine (PEPCID) IVPB 20 mg premix (0 mg Intravenous Stopped 06/04/18 1836)  iohexol (OMNIPAQUE) 300 MG/ML solution 100 mL (80 mLs Intravenous Contrast Given 06/04/18 1959)     Initial Impression / Assessment and Plan / ED Course  I have reviewed the triage vital signs and the nursing notes.  Pertinent labs & imaging results that were  available during my care of the patient were reviewed by me and considered in my medical decision making (see chart for details).     Thomas [name] is a 77 y.o. male with PMHx of CAD s/p stents, GERD who p/w sudden onset epigastric pain, N/V. Reviewed and confirmed nursing documentation for past medical history, family history, social history. VS afebrile, wnl. Exam remarkable for epigastric tenderness with guarding. Ddx includes perforated ulcer, pancreatitis, ACS, gastritis.  Improved with IVF, fentanyl, zofran, pepcid. EKG with no acute ischemic changes. Trop neg. CMP with AKI, Cr 1.81, hyperbilirubinemia at 1.6, lipase elevated at 250. CBC with mild leukocytosis 11.3, acute abd xray neg. CT abd/pelvis w/ contrast with no acute process, though cholelithiasis identified.  Concern for choledocolithiasis as cause of pancreatitis.   Discussed case with Dr.Pyrkle, Gastroenterology, who recommends MRCP. Will consult as inpatient.  Old records reviewed. Labs reviewed by me and used in the medical decision making.  Imaging viewed and interpreted by me and used in the medical decision making (formal interpretation from radiologist). EKG reviewed by me and used in the medical decision making. Admitted to hospitalist.    Final Clinical Impressions(s) / ED Diagnoses   Final diagnoses:  Acute pancreatitis, unspecified complication status, unspecified pancreatitis type  Gallstones  Non-intractable vomiting with nausea, unspecified vomiting type     Diannia Ruder, MD 06/05/18 0257    Melene Plan, DO 06/05/18 1520

## 2018-06-04 NOTE — ED Triage Notes (Signed)
[facility] Patient [place] at [facility], but family took patient to [facility] for help. EMS called. EMS reports:  Patient began experiencing central chest pain/epigastric pain at approx 1600 after eating at [employer]. Radiates to back. Patient vomited. Reported feeling better briefly.  RBBB on monitor with PVCs. Patient reports this is not new for him. Patient given ASA orally at fire station, but vomited afterwards.  BP 160/90, Pulse 100, CBG 112.

## 2018-06-05 ENCOUNTER — Inpatient Hospital Stay (HOSPITAL_COMMUNITY): Payer: Medicare Other

## 2018-06-05 LAB — HEPATIC FUNCTION PANEL
ALK PHOS: 58 U/L (ref 38–126)
ALT: 55 U/L — AB (ref 0–44)
AST: 62 U/L — AB (ref 15–41)
Albumin: 3.5 g/dL (ref 3.5–5.0)
BILIRUBIN DIRECT: 0.2 mg/dL (ref 0.0–0.2)
BILIRUBIN INDIRECT: 0.7 mg/dL (ref 0.3–0.9)
BILIRUBIN TOTAL: 0.9 mg/dL (ref 0.3–1.2)
Total Protein: 5.8 g/dL — ABNORMAL LOW (ref 6.5–8.1)

## 2018-06-05 LAB — BASIC METABOLIC PANEL
ANION GAP: 8 (ref 5–15)
BUN: 16 mg/dL (ref 8–23)
CALCIUM: 8.8 mg/dL — AB (ref 8.9–10.3)
CHLORIDE: 110 mmol/L (ref 98–111)
CO2: 26 mmol/L (ref 22–32)
Creatinine, Ser: 1.65 mg/dL — ABNORMAL HIGH (ref 0.61–1.24)
GFR calc Af Amer: 45 mL/min — ABNORMAL LOW (ref >60)
GFR calc non Af Amer: 39 mL/min — ABNORMAL LOW (ref >60)
GLUCOSE: 107 mg/dL — AB (ref 70–99)
Potassium: 4.3 mmol/L (ref 3.5–5.1)
Sodium: 144 mmol/L (ref 135–145)

## 2018-06-05 LAB — CBC
HCT: 41 % (ref 39.0–52.0)
HEMOGLOBIN: 13.2 g/dL (ref 13.0–17.0)
MCH: 33.8 pg (ref 26.0–34.0)
MCHC: 32.2 g/dL (ref 30.0–36.0)
MCV: 104.9 fL — AB (ref 78.0–100.0)
Platelets: 205 10*3/uL (ref 150–400)
RBC: 3.91 MIL/uL — ABNORMAL LOW (ref 4.22–5.81)
RDW: 13.4 % (ref 11.5–15.5)
WBC: 5.8 10*3/uL (ref 4.0–10.5)

## 2018-06-05 LAB — TROPONIN I

## 2018-06-05 LAB — GLUCOSE, CAPILLARY
GLUCOSE-CAPILLARY: 91 mg/dL (ref 70–99)
GLUCOSE-CAPILLARY: 77 mg/dL (ref 70–99)
GLUCOSE-CAPILLARY: 80 mg/dL (ref 70–99)
Glucose-Capillary: 89 mg/dL (ref 70–99)
Glucose-Capillary: 90 mg/dL (ref 70–99)
Glucose-Capillary: 99 mg/dL (ref 70–99)

## 2018-06-05 LAB — TRIGLYCERIDES: Triglycerides: 61 mg/dL (ref <150)

## 2018-06-05 MED ORDER — PIPERACILLIN-TAZOBACTAM 3.375 G IVPB
3.3750 g | Freq: Three times a day (TID) | INTRAVENOUS | Status: DC
  Administered 2018-06-05 – 2018-06-06 (×2): 3.375 g via INTRAVENOUS
  Filled 2018-06-05 (×3): qty 50

## 2018-06-05 MED ORDER — DEXTROSE-NACL 5-0.45 % IV SOLN
INTRAVENOUS | Status: DC
  Administered 2018-06-05: 75 mL/h via INTRAVENOUS
  Administered 2018-06-06 – 2018-06-07 (×2): via INTRAVENOUS

## 2018-06-05 MED ORDER — LEVOTHYROXINE SODIUM 75 MCG PO TABS
150.0000 ug | ORAL_TABLET | Freq: Every day | ORAL | Status: DC
  Administered 2018-06-06 – 2018-06-08 (×3): 150 ug via ORAL
  Filled 2018-06-05 (×3): qty 2

## 2018-06-05 MED ORDER — FAMOTIDINE IN NACL 20-0.9 MG/50ML-% IV SOLN
20.0000 mg | Freq: Two times a day (BID) | INTRAVENOUS | Status: DC
  Administered 2018-06-05 – 2018-06-07 (×3): 20 mg via INTRAVENOUS
  Filled 2018-06-05 (×3): qty 50

## 2018-06-05 MED ORDER — ASPIRIN 81 MG PO CHEW
81.0000 mg | CHEWABLE_TABLET | Freq: Every day | ORAL | Status: DC
Start: 2018-06-05 — End: 2018-06-08
  Administered 2018-06-05 – 2018-06-08 (×3): 81 mg via ORAL
  Filled 2018-06-05 (×3): qty 1

## 2018-06-05 MED ORDER — SIMVASTATIN 40 MG PO TABS
40.0000 mg | ORAL_TABLET | Freq: Every day | ORAL | Status: DC
  Administered 2018-06-05 – 2018-06-07 (×3): 40 mg via ORAL
  Filled 2018-06-05 (×3): qty 1

## 2018-06-05 NOTE — Progress Notes (Addendum)
Patient Demographics:    Thomas [name], is a 77 y.o. male, DOB - [birth year], EVO:[id number]  Admit date - 06/04/2018   Admitting Physician Rise Patience, MD  Outpatient Primary MD for the patient is Leonard Downing, MD  LOS - 1   Chief Complaint  Patient presents with  . Abdominal Pain        Subjective:    Thomas [name] today has no fevers, no emesis,  No chest pain, improving abdominal pain, requesting solid food,  Assessment  & Plan :    Principal Problem:   Acute pancreatitis Active Problems:   CAD (coronary artery disease)   HTN (hypertension)   Abdominal pain   ARF (acute renal failure) (HCC)   Gallstones  Brief Summary 77 y.o. male with history of CAD, hypertension, hypothyroidism admitted on 06/04/2018 with epigastric pain found to have elevated lipase and gallstones  Plan  1) acute pancreatitis/Acute Cholecystitis----LFTs with mild ALT and AST elevation, T bili has normalized, alk phos is not elevated, MRCP without common bile duct stones or CBD dilatation, however patient does have gallstones, with some degree of gallbladder wall thickening, discussed with GI service (Sarah and Dr Carlean Purl))  no plans for ERCP at this time, discussed with surgical service  (Dr Greer Pickerel) patient will need cholecystectomy, antibiotics as per surgical team,  timing of his cholecystectomy is yet to be determined, continue n.p.o. status except for meds and ice chips,, Pepcid as ordered, PRN antiemetics and opiates.  Triglycerides are not elevated  2)CAD--- history of CAD, last Coronary stent sometime in 2008, however patient apparently has an LAD lesion for which cardiology had wanted him to be on lifelong aspirin and Plavix combo, Please consult cardiology in a.m. of 06/06/2018 to decide if patient can continue to stay off Plavix to allow for  lap chole, no beta-blocker due to resting  bradycardia, c/n  simvastatin, no ACS type symptoms, Plavix was not given on 06/05/2018.   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 to avoid hypoglycemia, IV hydration required as noted in #3 above  Code Status : Full   Disposition Plan  : TBD, most likely home  Consults  : Phone consult with GI, Gen Surg officially consult/Cardiology to be consulted  DVT Prophylaxis  :   SCDs  Lab Results  Component Value Date   PLT 205 06/04/2018    Inpatient Medications  Scheduled Meds: Continuous Infusions: . sodium chloride 75 mL/hr (06/05/18 1532)   PRN Meds:.acetaminophen **OR** acetaminophen, fentaNYL (SUBLIMAZE) injection, hydrALAZINE, ondansetron **OR** ondansetron (ZOFRAN) IV    Anti-infectives (From admission, onward)   None        Objective:   Vitals:   06/05/18 0408 06/05/18 0757 06/05/18 1149 06/05/18 1548  BP: 123/66 (!) 112/58 118/65 136/69  Pulse: (!) 55 60 (!) 59 (!) 56  Resp: 18     Temp: 97.7 F (36.5 C) 98 F (36.7 C) 97.7 F (36.5 C) 97.8 F (36.6 C)  TempSrc: Oral Oral Oral Oral  SpO2: 96% 98% 97% 98%  Weight: 94.8 kg (209 lb 1.6 oz)     Height:        Wt Readings from Last 3 Encounters:  06/05/18 94.8 kg (209 lb 1.6 oz)  04/02/18 98.6 kg (217 lb 6.4 oz)  02/28/17 100.7 kg (222 lb)     Intake/Output Summary (Last 24 hours) at 06/05/2018 1659 Last data filed at 06/05/2018 1643 Gross per 24 hour  Intake 1242.17 ml  Output 1075 ml  Net 167.17 ml     Physical Exam  Gen:- Awake Alert,  In no  apparent distress  HEENT:- [place].AT, No sclera icterus Neck-Supple Neck,No JVD,.  Lungs-  CTAB , good air movement bilaterally CV- S1, S2 normal Abd-  +ve B.Sounds, Abd Soft, epigastric and left upper quadrant tenderness without significant rebound or guarding    Extremity/Skin:- No  edema,   good pulses Psych-affect is appropriate, oriented x3 Neuro-no new focal deficits, no tremors   Data Review:   Micro Results No results found for this or any previous visit (from the past 240 hour(s)).  Radiology Reports Ct Abdomen Pelvis W Contrast  Result Date: 06/04/2018 CLINICAL DATA:  77 y/o M; epigastric abdominal pain with nausea vomiting. EXAM: CT ABDOMEN AND PELVIS WITH CONTRAST TECHNIQUE: Multidetector CT imaging of the abdomen and pelvis was performed using the standard protocol following bolus administration of intravenous contrast. CONTRAST:  71m OMNIPAQUE IOHEXOL 300 MG/ML  SOLN COMPARISON:  None. FINDINGS: Lower chest: Moderate coronary artery calcific atherosclerosis. 3-4 mm right middle lobe calcified granuloma. Hepatobiliary: No focal liver abnormality is seen. Cholelithiasis. No gallbladder wall thickening or biliary ductal dilatation. Pancreas: Unremarkable. No pancreatic ductal dilatation or surrounding inflammatory changes. Spleen: Normal in size without focal abnormality. Adrenals/Urinary Tract: 13 mm right kidney interpolar cyst and subcentimeter left kidney upper pole cyst. Multiple nonobstructing right-sided kidney stones measuring up to 5 mm. No hydronephrosis. Normal bladder. Stomach/Bowel: Stomach is within normal limits. Appendix appears normal. No evidence of bowel wall thickening, distention, or inflammatory changes. Sigmoid diverticulosis without findings of acute diverticulitis. 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 Calcific aortic atherosclerosis. Reproductive: Obscured by streak artifact from hip hardware. Other: No abdominal wall hernia or  abnormality. No abdominopelvic ascites. Musculoskeletal: Bilateral hip arthroplasty. Advanced multilevel disc and facet degenerative changes of the spine. 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 IMPRESSION: 1. No acute process identified. 2. Coronary and aortic calcific atherosclerosis. 3. Cholelithiasis. 4. Multiple nonobstructing right kidney stones measuring up 5 mm. 5. 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 6. 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 Recommend followup by ultrasound in 5 years. This recommendation follows ACR consensus guidelines: White Paper of the ACR Incidental Findings Committee II on Vascular Findings. J Am Coll Radiol 2013; 10:789-794. Electronically Signed   By: LKristine GarbeM.D.   On: 06/04/2018 20:38   Mr Abdomen Mrcp Wo Contrast  Result Date: 06/05/2018 CLINICAL DATA:  Epigastric pain and burning.  Cholelithiasis. EXAM: MRI ABDOMEN WITHOUT CONTRAST  (INCLUDING MRCP) TECHNIQUE: Multiplanar multisequence MR imaging of the abdomen was performed. Heavily T2-weighted images of the biliary and pancreatic ducts were obtained, and three-dimensional MRCP images were rendered by post processing. COMPARISON:  CT scan dated 06/04/2018 FINDINGS: Lower chest: Small type  1 hiatal hernia. Hepatobiliary: For gallstones measuring up to 1.8 cm in long axis are observed in the gallbladder with mild gallbladder wall thickening. No intrahepatic or extrahepatic biliary dilatation. No directly visualized choledocholithiasis. No specific hepatic parenchymal lesions. Pancreas: Unremarkable noncontrast appearance of the pancreas. No dorsal pancreatic duct dilatation. Spleen:  Unremarkable Adrenals/Urinary Tract: 9 mm fluid signal intensity lesion of the right mid kidney laterally. 5 mm fluid signal intensity lesion of the left kidney upper pole. No hydronephrosis. The patient has known right  nephrolithiasis more apparent on the CT scan. Stomach/Bowel: Nondistended stomach.  Otherwise unremarkable. Vascular/Lymphatic: Aortoiliac atherosclerotic vascular disease. Small porta hepatis lymph nodes on image 15/9 are not pathologically enlarged by size criteria. Other:  No supplemental non-categorized findings. Musculoskeletal: Thoracolumbar spondylosis and degenerative disc disease. The patient has known pars defects at L5. IMPRESSION: 1. Cholelithiasis with mild gallbladder wall thickening. Correlate clinically in assessing for acute cholecystitis. If further imaging workup for cholecystitis is indicated, nuclear medicine hepatobiliary scan could be utilized to assess for cystic duct patency. 2. Small type 1 hiatal hernia. 3. Known right nephrolithiasis, better shown on CT. 4.  Aortic Atherosclerosis (ICD10-I70.0). 5. 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 Electronically Signed   By: Van Clines M.D.   On: 06/05/2018 07:11   Dg Abd Acute W/chest  Result Date: 06/04/2018 CLINICAL DATA:  77 y/o M; she central chest pain and epigastric pain. Radiates to the back. Vomiting. EXAM: DG ABDOMEN ACUTE W/ 1V CHEST COMPARISON:  07/30/2015 chest radiograph FINDINGS: There is no evidence of dilated bowel loops or free intraperitoneal air. No radiopaque calculi or other significant radiographic abnormality is seen. Heart size and mediastinal contours are within normal limits. Both lungs are clear. Partially visualized hip prostheses. Degenerative changes of the spine with mild thoracolumbar dextrocurvature. IMPRESSION: Negative abdominal radiographs.  No acute cardiopulmonary disease. Electronically Signed   By: Kristine Garbe M.D.   On: 06/04/2018 18:53     CBC Recent Labs  Lab 06/04/18 1745 06/04/18 2357  WBC 11.3* 5.8  HGB 14.5 13.2  HCT 46.4 41.0  PLT 247 205  MCV 106.7* 104.9*  MCH 33.3 33.8  MCHC 31.3 32.2  RDW 13.3 13.4  LYMPHSABS  1.3  --   MONOABS 0.9  --   EOSABS 0.1  --   BASOSABS 0.1  --     Chemistries  Recent Labs  Lab 06/04/18 1745 06/04/18 2357  NA 144 144  K 3.6 4.3  CL 106 110  CO2 25 26  GLUCOSE 116* 107*  BUN 17 16  CREATININE 1.81* 1.65*  CALCIUM 9.5 8.8*  AST 38 62*  ALT 27 55*  ALKPHOS 61 58  BILITOT 1.6* 0.9   ------------------------------------------------------------------------------------------------------------------ Recent Labs    06/04/18 2357  TRIG 61    No results found for: HGBA1C ------------------------------------------------------------------------------------------------------------------ No results for input(s): TSH, T4TOTAL, T3FREE, THYROIDAB in the last 72 hours.  Invalid input(s): FREET3 ------------------------------------------------------------------------------------------------------------------ No results for input(s): VITAMINB12, FOLATE, FERRITIN, TIBC, IRON, RETICCTPCT in the last 72 hours.  Coagulation profile Recent Labs  Lab 06/04/18 1745  INR 1.11    No results for input(s): DDIMER in the last 72 hours.  Cardiac Enzymes Recent Labs  Lab 06/04/18 2357  TROPONINI <0.03   ------------------------------------------------------------------------------------------------------------------ No results found for: BNP   Roxan Hockey M.D on 06/05/2018 at 4:59 PM  Between 7am to 7pm - Pager - [phone]  After 7pm go to [email or web address] - password [email or web address]  [facility] -  Office  [phone]   Voice Recognition Viviann Spare dictation system was used to create this note, attempts have been made to correct errors. Please contact the author with questions and/or clarifications.

## 2018-06-05 NOTE — Progress Notes (Addendum)
Pharmacy Antibiotic Note  Thomas [name] is a 77 y.o. male admitted on 06/04/2018 with chest and epigastric pain.  Pharmacy has been consulted for Zosyn dosing for acute calculus cholecystitis. Plan is for lap chole.  Plan: Zosyn 3.375g IV q8h (4 hour infusion).  Monitor renal function, clinical progress, and LOT  Height: 6' (182.9 cm) Weight: 209 lb 1.6 oz (94.8 kg) IBW/kg (Calculated) : 77.6  Temp (24hrs), Avg:97.8 F (36.6 C), Min:97.7 F (36.5 C), Max:98 F (36.7 C)  Recent Labs  Lab 06/04/18 1745 06/04/18 2357  WBC 11.3* 5.8  CREATININE 1.81* 1.65*    Estimated Creatinine Clearance: 45.5 mL/min (A) (by C-G formula based on SCr of 1.65 mg/dL (H)).    Allergies  Allergen Reactions  . Oxycodone Nausea Only  . Robaxin [Methocarbamol] Nausea Only  . Tramadol Nausea Only and Other (See Comments)    Hallucinations     Thank you for allowing pharmacy to be a part of this patient's care.  Thomas [name], PharmD, BCPS Clinical Pharmacist Clinical phone for 06/05/2018 until 10p is [phone] Please check AMION for all Pharmacist numbers by unit 06/05/2018 6:46 PM

## 2018-06-05 NOTE — Consult Note (Signed)
Reason for Consult:gallstone pancreatitis Referring Physician: Dr Almyra Free is an 77 y.o. male.  HPI: 77 year old male with history of coronary artery disease, hypertension, hyperlipidemia, hypothyroidism comes in yesterday complaining of acute onset of nausea, vomiting, and upper abdominal pain after eating lunch.  He went to [employer] and ate a salad.  A few hours later he developed multiple episodes of emesis that lasted several hours as well as severe abdominal pain radiating to his back.  He came to the emergency room because the pain was persistent as well as he continued to vomit.  He denies any fevers or chills.  He reports a similar episode about 3 weeks ago but he did not vomit with that nor did it last that long or was as intense.  He did have right upper quadrant epigastric pain that radiated to his back after eating a meal.  He denies any new medications.  He does not drink alcohol.  He quit smoking a few years ago.  He denies any prior abdominal surgery.  He was found to have elevated creatinine on admission.  He had a bump in his lipase to 250.  He also had a mild leukocytosis as well as a mildly elevated total bilirubin.  His last labs were last night and his creatinine was trending down.  CT imaging demonstrated cholelithiasis without any overt evidence of cholecystitis or pancreatitis.  He had an MRCP which demonstrated mild gallbladder wall thickening along with cholelithiasis without any evidence of choledocholithiasis as well as no other radiological evidence of pancreatitis  He is on Plavix.  He last took it on Monday.  He is on lifelong Plavix.  He has a stent in his LAD.  The proximal edge of the stent hangs out into the left main.  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  Plavix was not held during his hip replacement  surgery  Past Medical History:  Diagnosis Date  . Arthritis   . Coronary artery disease    takes Plavix and ASA daily  . GERD (gastroesophageal reflux disease)    occasional and will take Tums if needed  . History of blood clots 2008   in chest  . History of kidney stones late 90's  . Hyperlipidemia    takes Simvastatin daily  . Hypertension    takes Maxzide daily  . Hypothyroidism    takes Synthroid daily  . Joint pain   . Joint swelling    left knee  . Pneumonia    at 77 years old, double    Past Surgical History:  Procedure Laterality Date  . CARDIAC CATHETERIZATION    . CORONARY ANGIOPLASTY  05/21/2007   lad stent/taxus stent  . HEMORROIDECTOMY  late 1970's  . HERNIA REPAIR Right 1980's  . 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 TOTAL HIP ARTHROPLASTY Left 05/08/2015   Procedure: LEFT TOTAL HIP ARTHROPLASTY ANTERIOR APPROACH;  Surgeon: Gaynelle Arabian, MD;  Location: [facility];  Service: Orthopedics;  Laterality: Left;    Family History  Problem Relation Age of Onset  . CAD Mother   . Heart failure Mother   . Pneumonia Father   . Heart failure Father  Social History:  reports that he has been smoking cigarettes.  He has a 13.00 pack-year smoking history. He has never used smokeless tobacco. He reports that he does not drink alcohol or use drugs.  Allergies:  Allergies  Allergen Reactions  . Oxycodone Nausea Only  . Robaxin [Methocarbamol] Nausea Only  . Tramadol Nausea Only and Other (See Comments)    Hallucinations    Medications: I have reviewed the patient's current medications.  Results for orders placed or performed during the hospital encounter of 06/04/18 (from the past 48 hour(s))  Comprehensive metabolic panel     Status: Abnormal   Collection Time: 06/04/18  5:45 PM  Result Value Ref Range    Sodium 144 135 - 145 mmol/L   Potassium 3.6 3.5 - 5.1 mmol/L   Chloride 106 101 - 111 mmol/L   CO2 25 22 - 32 mmol/L   Glucose, Bld 116 (H) 65 - 99 mg/dL   BUN 17 6 - 20 mg/dL   Creatinine, Ser 1.81 (H) 0.61 - 1.24 mg/dL   Calcium 9.5 8.9 - 10.3 mg/dL   Total Protein 6.7 6.5 - 8.1 g/dL   Albumin 4.1 3.5 - 5.0 g/dL   AST 38 15 - 41 U/L   ALT 27 17 - 63 U/L   Alkaline Phosphatase 61 38 - 126 U/L   Total Bilirubin 1.6 (H) 0.3 - 1.2 mg/dL   GFR calc non Af Amer 35 (L) >60 mL/min   GFR calc Af Amer 40 (L) >60 mL/min    Comment: (NOTE) The eGFR has been calculated using the CKD EPI equation. This calculation has not been validated in all clinical situations. eGFR's persistently <60 mL/min signify possible Chronic Kidney Disease.    Anion gap 13 5 - 15    Comment: Performed at [facility] [address] [place], [place] [address]  Lipase, blood     Status: Abnormal   Collection Time: 06/04/18  5:45 PM  Result Value Ref Range   Lipase 250 (H) 11 - 51 U/L    Comment: Performed at [facility] [address] [place], [place] [address]  CBC with Differential     Status: Abnormal   Collection Time: 06/04/18  5:45 PM  Result Value Ref Range   WBC 11.3 (H) 4.0 - 10.5 K/uL   RBC 4.35 4.22 - 5.81 MIL/uL   Hemoglobin 14.5 13.0 - 17.0 g/dL   HCT 46.4 39.0 - 52.0 %   MCV 106.7 (H) 78.0 - 100.0 fL   MCH 33.3 26.0 - 34.0 pg   MCHC 31.3 30.0 - 36.0 g/dL   RDW 13.3 11.5 - 15.5 %   Platelets 247 150 - 400 K/uL   Neutrophils Relative % 78 %   Neutro Abs 8.9 (H) 1.7 - 7.7 K/uL   Lymphocytes Relative 12 %   Lymphs Abs 1.3 0.7 - 4.0 K/uL   Monocytes Relative 8 %   Monocytes Absolute 0.9 0.1 - 1.0 K/uL   Eosinophils Relative 1 %   Eosinophils Absolute 0.1 0.0 - 0.7 K/uL   Basophils Relative 1 %   Basophils Absolute 0.1 0.0 - 0.1 K/uL   Immature Granulocytes 0 %   Abs Immature Granulocytes 0.0 0.0 - 0.1 K/uL    Comment: Performed at [facility] [address]  [place], [place] [address]  Protime-INR     Status: None   Collection Time: 06/04/18  5:45 PM  Result Value Ref Range   Prothrombin  Time 14.3 11.4 - 15.2 seconds   INR 1.11     Comment: Performed at [facility], [place] [address], [place], [place] [address]  I-stat troponin, ED     Status: None   Collection Time: 06/04/18  6:22 PM  Result Value Ref Range   Troponin i, poc 0.00 0.00 - 0.08 ng/mL   Comment 3            Comment: Due to the release kinetics of cTnI, a negative result within the first hours of the onset of symptoms does not rule out myocardial infarction with certainty. If myocardial infarction is still suspected, repeat the test at appropriate intervals.   Basic metabolic panel     Status: Abnormal   Collection Time: 06/04/18 11:57 PM  Result Value Ref Range   Sodium 144 135 - 145 mmol/L   Potassium 4.3 3.5 - 5.1 mmol/L   Chloride 110 98 - 111 mmol/L   CO2 26 22 - 32 mmol/L   Glucose, Bld 107 (H) 70 - 99 mg/dL   BUN 16 8 - 23 mg/dL   Creatinine, Ser 1.65 (H) 0.61 - 1.24 mg/dL   Calcium 8.8 (L) 8.9 - 10.3 mg/dL   GFR calc non Af Amer 39 (L) >60 mL/min   GFR calc Af Amer 45 (L) >60 mL/min    Comment: (NOTE) The eGFR has been calculated using the CKD EPI equation. This calculation has not been validated in all clinical situations. eGFR's persistently <60 mL/min signify possible Chronic Kidney Disease.    Anion gap 8 5 - 15    Comment: Performed at [facility] [address], [place], [place] [address]  CBC     Status: Abnormal   Collection Time: 06/04/18 11:57 PM  Result Value Ref Range   WBC 5.8 4.0 - 10.5 K/uL   RBC 3.91 (L) 4.22 - 5.81 MIL/uL   Hemoglobin 13.2 13.0 - 17.0 g/dL   HCT 41.0 39.0 - 52.0 %   MCV 104.9 (H) 78.0 - 100.0 fL   MCH 33.8 26.0 - 34.0 pg   MCHC 32.2 30.0 - 36.0 g/dL   RDW 13.4 11.5 - 15.5 %   Platelets 205 150 - 400 K/uL    Comment: Performed at [facility], [place] [address] [place], [place] [address]  Troponin I      Status: None   Collection Time: 06/04/18 11:57 PM  Result Value Ref Range   Troponin I <0.03 <0.03 ng/mL    Comment: Performed at [facility] [address], [place], [place] [address]  Hepatic function panel     Status: Abnormal   Collection Time: 06/04/18 11:57 PM  Result Value Ref Range   Total Protein 5.8 (L) 6.5 - 8.1 g/dL   Albumin 3.5 3.5 - 5.0 g/dL   AST 62 (H) 15 - 41 U/L   ALT 55 (H) 0 - 44 U/L   Alkaline Phosphatase 58 38 - 126 U/L   Total Bilirubin 0.9 0.3 - 1.2 mg/dL   Bilirubin, Direct 0.2 0.0 - 0.2 mg/dL   Indirect Bilirubin 0.7 0.3 - 0.9 mg/dL    Comment: Performed at [facility] [address] [place], [place] [address]  Triglycerides     Status: None   Collection Time: 06/04/18 11:57 PM  Result Value Ref Range   Triglycerides 61 <150 mg/dL    Comment: Performed at [facility] [address], [place], [place] [address]  Glucose, capillary     Status: None   Collection Time: 06/05/18 12:05 AM  Result Value Ref Range   Glucose-Capillary 99 70 - 99 mg/dL  Glucose, capillary     Status: None   Collection Time: 06/05/18  5:46 AM  Result Value Ref Range   Glucose-Capillary 90 70 - 99 mg/dL  Glucose, capillary     Status: None   Collection Time: 06/05/18  7:38 AM  Result Value Ref Range   Glucose-Capillary 89 70 - 99 mg/dL  Glucose, capillary     Status: None   Collection Time: 06/05/18 11:39 AM  Result Value Ref Range   Glucose-Capillary 91 70 - 99 mg/dL  Glucose, capillary     Status: None   Collection Time: 06/05/18  4:41 PM  Result Value Ref Range   Glucose-Capillary 77 70 - 99 mg/dL    Ct Abdomen Pelvis W Contrast  Result Date: 06/04/2018 CLINICAL DATA:  77 y/o M; epigastric abdominal pain with nausea vomiting. EXAM: CT ABDOMEN AND PELVIS WITH CONTRAST TECHNIQUE: Multidetector CT imaging of the abdomen and pelvis was performed using the standard protocol following bolus administration of intravenous contrast. CONTRAST:  65m  OMNIPAQUE IOHEXOL 300 MG/ML  SOLN COMPARISON:  None. FINDINGS: Lower chest: Moderate coronary artery calcific atherosclerosis. 3-4 mm right middle lobe calcified granuloma. Hepatobiliary: No focal liver abnormality is seen. Cholelithiasis. No gallbladder wall thickening or biliary ductal dilatation. Pancreas: Unremarkable. No pancreatic ductal dilatation or surrounding inflammatory changes. Spleen: Normal in size without focal abnormality. Adrenals/Urinary Tract: 13 mm right kidney interpolar cyst and subcentimeter left kidney upper pole cyst. Multiple nonobstructing right-sided kidney stones measuring up to 5 mm. No hydronephrosis. Normal bladder. Stomach/Bowel: Stomach is within normal limits. Appendix appears normal. No evidence of bowel wall thickening, distention, or inflammatory changes. Sigmoid diverticulosis without findings of acute diverticulitis. 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 Calcific aortic atherosclerosis. Reproductive: Obscured by streak artifact from hip hardware. Other: No abdominal wall hernia or abnormality. No abdominopelvic ascites. Musculoskeletal: Bilateral hip arthroplasty. Advanced multilevel disc and facet degenerative changes of the spine. 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 IMPRESSION: 1. No acute process identified. 2. Coronary and aortic calcific atherosclerosis. 3. Cholelithiasis. 4. Multiple nonobstructing right kidney stones measuring up 5 mm. 5. 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 6. 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 Recommend followup by ultrasound in 5 years. This recommendation follows ACR consensus guidelines: White Paper of the ACR Incidental Findings Committee II on Vascular Findings. J Am Coll Radiol 2013; 10:789-794. Electronically Signed   By: LKristine GarbeM.D.   On: 06/04/2018 20:38   Mr Abdomen Mrcp Wo  Contrast  Result Date: 06/05/2018 CLINICAL DATA:  Epigastric pain and burning.  Cholelithiasis. EXAM: MRI ABDOMEN WITHOUT CONTRAST  (INCLUDING MRCP) TECHNIQUE: Multiplanar multisequence MR imaging of the abdomen was performed. Heavily T2-weighted images of the biliary and pancreatic ducts were obtained, and three-dimensional MRCP images were rendered by post processing. COMPARISON:  CT scan dated 06/04/2018 FINDINGS: Lower chest: Small type 1 hiatal hernia. Hepatobiliary: For gallstones measuring up to 1.8 cm in long axis are observed in the gallbladder with mild gallbladder wall thickening. No intrahepatic or extrahepatic biliary dilatation. No directly visualized choledocholithiasis. No specific hepatic parenchymal lesions. Pancreas: Unremarkable noncontrast appearance of the pancreas. No dorsal pancreatic duct dilatation. Spleen:  Unremarkable Adrenals/Urinary Tract: 9 mm fluid signal intensity lesion of the right mid kidney laterally.  5 mm fluid signal intensity lesion of the left kidney upper pole. No hydronephrosis. The patient has known right nephrolithiasis more apparent on the CT scan. Stomach/Bowel: Nondistended stomach.  Otherwise unremarkable. Vascular/Lymphatic: Aortoiliac atherosclerotic vascular disease. Small porta hepatis lymph nodes on image 15/9 are not pathologically enlarged by size criteria. Other:  No supplemental non-categorized findings. Musculoskeletal: Thoracolumbar spondylosis and degenerative disc disease. The patient has known pars defects at L5. IMPRESSION: 1. Cholelithiasis with mild gallbladder wall thickening. Correlate clinically in assessing for acute cholecystitis. If further imaging workup for cholecystitis is indicated, nuclear medicine hepatobiliary scan could be utilized to assess for cystic duct patency. 2. Small type 1 hiatal hernia. 3. Known right nephrolithiasis, better shown on CT. 4.  Aortic Atherosclerosis (ICD10-I70.0). 5. 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 Electronically Signed   By: Van Clines M.D.   On: 06/05/2018 07:11   Dg Abd Acute W/chest  Result Date: 06/04/2018 CLINICAL DATA:  77 y/o M; she central chest pain and epigastric pain. Radiates to the back. Vomiting. EXAM: DG ABDOMEN ACUTE W/ 1V CHEST COMPARISON:  07/30/2015 chest radiograph FINDINGS: There is no evidence of dilated bowel loops or free intraperitoneal air. No radiopaque calculi or other significant radiographic abnormality is seen. Heart size and mediastinal contours are within normal limits. Both lungs are clear. Partially visualized hip prostheses. Degenerative changes of the spine with mild thoracolumbar dextrocurvature. IMPRESSION: Negative abdominal radiographs.  No acute cardiopulmonary disease. Electronically Signed   By: Kristine Garbe M.D.   On: 06/04/2018 18:53    Review of Systems  Constitutional: Negative for chills, fever and weight loss.  HENT: Negative for nosebleeds.   Eyes: Negative for blurred vision.  Respiratory: Negative for shortness of breath.   Cardiovascular: Negative for chest pain, palpitations, orthopnea and PND.       Denies DOE  Gastrointestinal: Positive for abdominal pain, nausea and vomiting. Negative for blood in stool, constipation and melena.  Genitourinary: Negative for dysuria and hematuria.  Musculoskeletal: Negative.   Skin: Negative for itching and rash.  Neurological: Negative for dizziness, focal weakness, seizures, loss of consciousness and headaches.       Denies TIAs, amaurosis fugax  Endo/Heme/Allergies: Does not bruise/bleed easily.  Psychiatric/Behavioral: The patient is not nervous/anxious.   All other systems reviewed and are negative.  Blood pressure 136/69, pulse (!) 56, temperature 97.8 F (36.6 C), temperature source Oral, resp. rate 18, height 6' (1.829 m), weight 94.8 kg (209 lb 1.6 oz), SpO2 98 %. Physical Exam  Vitals reviewed. Constitutional: He is  oriented to person, place, and time. He appears well-developed and well-nourished. No distress.  HENT:  Head: Normocephalic and atraumatic.  Right Ear: External ear normal.  Left Ear: External ear normal.  Eyes: Conjunctivae are normal. No scleral icterus.  Neck: Normal range of motion. Neck supple. No tracheal deviation present. No thyromegaly present.  Cardiovascular: Normal rate and normal heart sounds.  Respiratory: Effort normal and breath sounds normal. No stridor. No respiratory distress. He has no wheezes.  GI: Soft. Normal appearance. He exhibits no distension. There is tenderness in the right upper quadrant and epigastric area. There is no rigidity, no rebound and no guarding. No hernia.    Musculoskeletal: He exhibits no edema or tenderness.  Lymphadenopathy:    He has no cervical adenopathy.  Neurological: He is alert and oriented to person, place, and time. He exhibits normal muscle tone.  Skin: Skin is warm and dry. No rash noted.  He is not diaphoretic. No erythema. No pallor.  Psychiatric: He has a normal mood and affect. His behavior is normal. Judgment and thought content normal.    Assessment/Plan: Acute calculus cholecystitis Mildly elevated lipase Acute kidney injury-improving Coronary artery disease On antiplatelet therapy Hypertension Hyperlipidemia  I think his picture is more consistent with acute calculus cholecystitis.  His lipase was only mildly elevated and there is no radiological evidence of pancreatitis on CT or MRI.  He was starting to have some mild gallbladder wall thickening on MRI so I think this is all more consistent with cholecystitis  -Therefore I recommended starting antibiotic for cholecystitis -He can have clear liquids but make n.p.o. except meds after midnight -Needs cardiology consult to weigh in whether or not his Plavix can be held for gallbladder sx.  They did not allow his Plavix to be held for hip replacement -Repeat labs in the  morning to make sure creatinine is normalizing, lipase is normalizing -Discussed with hospitalist -Timing up is to be determined based on cardiology's rec about plavix  I believe the patient's symptoms are consistent with gallbladder disease.  We discussed gallbladder disease.  I discussed laparoscopic cholecystectomy with possible IOC in detail.  The patient was given diagrams detailing the procedure.  We discussed the risks and benefits of a laparoscopic cholecystectomy including, but not limited to bleeding, infection, injury to surrounding structures such as the intestine or liver, bile leak, retained gallstones, need to convert to an open procedure, prolonged diarrhea, blood clots such as  DVT, common bile duct injury, anesthesia risks, and possible need for additional procedures.  We discussed the typical post-operative recovery course. I explained that the likelihood of improvement of their symptoms is good.  Leighton Ruff. Redmond Pulling, MD, FACS General, Bariatric, & Minimally Invasive Surgery [facility]   Greer Pickerel 06/05/2018, 6:33 PM

## 2018-06-06 ENCOUNTER — Encounter (HOSPITAL_COMMUNITY): Payer: Self-pay | Admitting: Orthopedic Surgery

## 2018-06-06 ENCOUNTER — Encounter (HOSPITAL_COMMUNITY)
Admission: EM | Disposition: A | Discharge status: Home / Self Care | Payer: Self-pay | Source: Home / Self Care | Attending: Family Medicine

## 2018-06-06 ENCOUNTER — Inpatient Hospital Stay (HOSPITAL_COMMUNITY): Payer: Medicare Other

## 2018-06-06 ENCOUNTER — Inpatient Hospital Stay (HOSPITAL_COMMUNITY): Payer: Medicare Other | Admitting: Certified Registered Nurse Anesthetist

## 2018-06-06 DIAGNOSIS — I2510 Atherosclerotic heart disease of native coronary artery without angina pectoris: Secondary | ICD-10-CM

## 2018-06-06 DIAGNOSIS — Z01810 Encounter for preprocedural cardiovascular examination: Secondary | ICD-10-CM

## 2018-06-06 DIAGNOSIS — N179 Acute kidney failure, unspecified: Secondary | ICD-10-CM

## 2018-06-06 DIAGNOSIS — K8500 Idiopathic acute pancreatitis without necrosis or infection: Secondary | ICD-10-CM

## 2018-06-06 DIAGNOSIS — I10 Essential (primary) hypertension: Secondary | ICD-10-CM

## 2018-06-06 DIAGNOSIS — K8020 Calculus of gallbladder without cholecystitis without obstruction: Secondary | ICD-10-CM

## 2018-06-06 HISTORY — PX: CHOLECYSTECTOMY: SHX55

## 2018-06-06 LAB — LIPASE, BLOOD: Lipase: 36 U/L (ref 11–51)

## 2018-06-06 LAB — COMPREHENSIVE METABOLIC PANEL
ALBUMIN: 3.1 g/dL — AB (ref 3.5–5.0)
ALT: 41 U/L (ref 0–44)
AST: 28 U/L (ref 15–41)
Alkaline Phosphatase: 55 U/L (ref 38–126)
Anion gap: 7 (ref 5–15)
BILIRUBIN TOTAL: 1.4 mg/dL — AB (ref 0.3–1.2)
BUN: 13 mg/dL (ref 8–23)
CO2: 26 mmol/L (ref 22–32)
Calcium: 8.5 mg/dL — ABNORMAL LOW (ref 8.9–10.3)
Chloride: 108 mmol/L (ref 98–111)
Creatinine, Ser: 1.35 mg/dL — ABNORMAL HIGH (ref 0.61–1.24)
GFR calc Af Amer: 57 mL/min — ABNORMAL LOW (ref >60)
GFR calc non Af Amer: 49 mL/min — ABNORMAL LOW (ref >60)
GLUCOSE: 90 mg/dL (ref 70–99)
POTASSIUM: 3.9 mmol/L (ref 3.5–5.1)
Sodium: 141 mmol/L (ref 135–145)
TOTAL PROTEIN: 5.4 g/dL — AB (ref 6.5–8.1)

## 2018-06-06 LAB — GLUCOSE, CAPILLARY
Glucose-Capillary: 83 mg/dL (ref 70–99)
Glucose-Capillary: 90 mg/dL (ref 70–99)
Glucose-Capillary: 134 mg/dL — ABNORMAL HIGH (ref 70–99)

## 2018-06-06 LAB — CBC
HEMATOCRIT: 41 % (ref 39.0–52.0)
Hemoglobin: 13.2 g/dL (ref 13.0–17.0)
MCH: 34.1 pg — AB (ref 26.0–34.0)
MCHC: 32.2 g/dL (ref 30.0–36.0)
MCV: 105.9 fL — ABNORMAL HIGH (ref 78.0–100.0)
Platelets: 181 10*3/uL (ref 150–400)
RBC: 3.87 MIL/uL — ABNORMAL LOW (ref 4.22–5.81)
RDW: 13.2 % (ref 11.5–15.5)
WBC: 4.4 10*3/uL (ref 4.0–10.5)

## 2018-06-06 SURGERY — LAPAROSCOPIC CHOLECYSTECTOMY WITH INTRAOPERATIVE CHOLANGIOGRAM
Anesthesia: General | Site: Abdomen

## 2018-06-06 MED ORDER — FENTANYL CITRATE (PF) 100 MCG/2ML IJ SOLN
25.0000 ug | INTRAMUSCULAR | Status: DC | PRN
  Administered 2018-06-06: 50 ug via INTRAVENOUS

## 2018-06-06 MED ORDER — GLYCOPYRROLATE 0.2 MG/ML IJ SOLN
INTRAMUSCULAR | Status: DC | PRN
  Administered 2018-06-06: 0.2 mg via INTRAVENOUS

## 2018-06-06 MED ORDER — LIDOCAINE 2% (20 MG/ML) 5 ML SYRINGE
INTRAMUSCULAR | Status: AC
  Filled 2018-06-06: qty 5

## 2018-06-06 MED ORDER — ALUM & MAG HYDROXIDE-SIMETH 200-200-20 MG/5ML PO SUSP
30.0000 mL | Freq: Four times a day (QID) | ORAL | Status: DC | PRN
  Administered 2018-06-06: 30 mL via ORAL
  Filled 2018-06-06: qty 30

## 2018-06-06 MED ORDER — MIDAZOLAM HCL 2 MG/2ML IJ SOLN
INTRAMUSCULAR | Status: AC
  Filled 2018-06-06: qty 2

## 2018-06-06 MED ORDER — FENTANYL CITRATE (PF) 100 MCG/2ML IJ SOLN
INTRAMUSCULAR | Status: AC
  Filled 2018-06-06: qty 2

## 2018-06-06 MED ORDER — PIPERACILLIN-TAZOBACTAM 3.375 G IVPB
3.3750 g | Freq: Three times a day (TID) | INTRAVENOUS | Status: AC
  Administered 2018-06-06: 3.375 g via INTRAVENOUS
  Filled 2018-06-06: qty 50

## 2018-06-06 MED ORDER — HEMOSTATIC AGENTS (NO CHARGE) OPTIME
TOPICAL | Status: DC | PRN
  Administered 2018-06-06: 1 via TOPICAL

## 2018-06-06 MED ORDER — PROPOFOL 10 MG/ML IV BOLUS
INTRAVENOUS | Status: DC | PRN
  Administered 2018-06-06: 120 mg via INTRAVENOUS

## 2018-06-06 MED ORDER — CLOPIDOGREL BISULFATE 75 MG PO TABS
75.0000 mg | ORAL_TABLET | Freq: Every day | ORAL | Status: DC
  Administered 2018-06-07 – 2018-06-08 (×2): 75 mg via ORAL
  Filled 2018-06-06 (×2): qty 1

## 2018-06-06 MED ORDER — FENTANYL CITRATE (PF) 250 MCG/5ML IJ SOLN
INTRAMUSCULAR | Status: AC
  Filled 2018-06-06: qty 5

## 2018-06-06 MED ORDER — DEXAMETHASONE SODIUM PHOSPHATE 10 MG/ML IJ SOLN
INTRAMUSCULAR | Status: AC
  Filled 2018-06-06: qty 1

## 2018-06-06 MED ORDER — GLYCOPYRROLATE PF 0.2 MG/ML IJ SOSY
PREFILLED_SYRINGE | INTRAMUSCULAR | Status: AC
  Filled 2018-06-06: qty 1

## 2018-06-06 MED ORDER — FENTANYL CITRATE (PF) 100 MCG/2ML IJ SOLN
INTRAMUSCULAR | Status: DC | PRN
  Administered 2018-06-06: 100 ug via INTRAVENOUS

## 2018-06-06 MED ORDER — PHENYLEPHRINE 40 MCG/ML (10ML) SYRINGE FOR IV PUSH (FOR BLOOD PRESSURE SUPPORT)
PREFILLED_SYRINGE | INTRAVENOUS | Status: DC | PRN
  Administered 2018-06-06 (×3): 80 ug via INTRAVENOUS
  Administered 2018-06-06: 120 ug via INTRAVENOUS

## 2018-06-06 MED ORDER — IOPAMIDOL (ISOVUE-300) INJECTION 61%
INTRAVENOUS | Status: AC
  Filled 2018-06-06: qty 50

## 2018-06-06 MED ORDER — ONDANSETRON HCL 4 MG/2ML IJ SOLN
INTRAMUSCULAR | Status: DC | PRN
  Administered 2018-06-06: 4 mg via INTRAVENOUS

## 2018-06-06 MED ORDER — SODIUM CHLORIDE 0.9 % IR SOLN
Status: DC | PRN
  Administered 2018-06-06: 1000 mL

## 2018-06-06 MED ORDER — LIDOCAINE 2% (20 MG/ML) 5 ML SYRINGE
INTRAMUSCULAR | Status: DC | PRN
  Administered 2018-06-06: 40 mg via INTRAVENOUS

## 2018-06-06 MED ORDER — SUGAMMADEX SODIUM 200 MG/2ML IV SOLN
INTRAVENOUS | Status: DC | PRN
  Administered 2018-06-06: 190 mg via INTRAVENOUS

## 2018-06-06 MED ORDER — DEXAMETHASONE SODIUM PHOSPHATE 10 MG/ML IJ SOLN
INTRAMUSCULAR | Status: DC | PRN
  Administered 2018-06-06: 5 mg via INTRAVENOUS

## 2018-06-06 MED ORDER — SODIUM CHLORIDE 0.9 % IV SOLN
INTRAVENOUS | Status: DC | PRN
  Administered 2018-06-06: 11 mL

## 2018-06-06 MED ORDER — BUPIVACAINE-EPINEPHRINE 0.25% -1:200000 IJ SOLN
INTRAMUSCULAR | Status: DC | PRN
  Administered 2018-06-06: 12 mL

## 2018-06-06 MED ORDER — SUGAMMADEX SODIUM 200 MG/2ML IV SOLN
INTRAVENOUS | Status: AC
  Filled 2018-06-06: qty 2

## 2018-06-06 MED ORDER — BUPIVACAINE-EPINEPHRINE (PF) 0.25% -1:200000 IJ SOLN
INTRAMUSCULAR | Status: AC
  Filled 2018-06-06: qty 30

## 2018-06-06 MED ORDER — LACTATED RINGERS IV SOLN
INTRAVENOUS | Status: DC
  Administered 2018-06-06: 09:00:00 via INTRAVENOUS

## 2018-06-06 MED ORDER — ROCURONIUM BROMIDE 100 MG/10ML IV SOLN
INTRAVENOUS | Status: DC | PRN
  Administered 2018-06-06 (×2): 10 mg via INTRAVENOUS
  Administered 2018-06-06: 50 mg via INTRAVENOUS

## 2018-06-06 MED ORDER — ONDANSETRON HCL 4 MG/2ML IJ SOLN: 4.0000 mg | Freq: Once | INTRAMUSCULAR | Status: DC | PRN

## 2018-06-06 MED ORDER — PROPOFOL 10 MG/ML IV BOLUS
INTRAVENOUS | Status: AC
  Filled 2018-06-06: qty 20

## 2018-06-06 MED ORDER — KETOROLAC TROMETHAMINE 30 MG/ML IJ SOLN
INTRAMUSCULAR | Status: DC | PRN
  Administered 2018-06-06: 30 mg via INTRAVENOUS

## 2018-06-06 MED ORDER — ROCURONIUM BROMIDE 50 MG/5ML IV SOLN
INTRAVENOUS | Status: AC
  Filled 2018-06-06: qty 1

## 2018-06-06 MED ORDER — KETOROLAC TROMETHAMINE 30 MG/ML IJ SOLN
INTRAMUSCULAR | Status: AC
  Filled 2018-06-06: qty 1

## 2018-06-06 MED ORDER — 0.9 % SODIUM CHLORIDE (POUR BTL) OPTIME
TOPICAL | Status: DC | PRN
  Administered 2018-06-06: 1000 mL

## 2018-06-06 SURGICAL SUPPLY — 49 items
APL SKNCLS STERI-STRIP NONHPOA (GAUZE/BANDAGES/DRESSINGS) ×1
APPLIER CLIP ROT 10 11.4 M/L (STAPLE) ×3
APR CLP MED LRG 11.4X10 (STAPLE) ×1
BAG SPEC RTRVL 10 TROC 200 (ENDOMECHANICALS)
BAG SPEC RTRVL LRG 6X4 10 (ENDOMECHANICALS) ×1
BENZOIN TINCTURE PRP APPL 2/3 (GAUZE/BANDAGES/DRESSINGS) ×3 IMPLANT
BLADE CLIPPER SURG (BLADE) IMPLANT
CANISTER SUCT 3000ML PPV (MISCELLANEOUS) ×3 IMPLANT
CHLORAPREP W/TINT 26ML (MISCELLANEOUS) ×3 IMPLANT
CLIP APPLIE ROT 10 11.4 M/L (STAPLE) ×1 IMPLANT
CLOSURE WOUND 1/2 X4 (GAUZE/BANDAGES/DRESSINGS) ×1
COVER MAYO STAND STRL (DRAPES) ×3 IMPLANT
COVER SURGICAL LIGHT HANDLE (MISCELLANEOUS) ×3 IMPLANT
DRAPE C-ARM 42X72 X-RAY (DRAPES) ×3 IMPLANT
DRSG TEGADERM 2-3/8X2-3/4 SM (GAUZE/BANDAGES/DRESSINGS) ×5 IMPLANT
DRSG TEGADERM 4X4.75 (GAUZE/BANDAGES/DRESSINGS) ×3 IMPLANT
ELECT REM PT RETURN 9FT ADLT (ELECTROSURGICAL) ×3
ELECTRODE REM PT RTRN 9FT ADLT (ELECTROSURGICAL) ×1 IMPLANT
FILTER SMOKE EVAC LAPAROSHD (FILTER) ×3 IMPLANT
GAUZE SPONGE 2X2 8PLY STRL LF (GAUZE/BANDAGES/DRESSINGS) ×1 IMPLANT
GLOVE BIO SURGEON STRL SZ7 (GLOVE) ×3 IMPLANT
GLOVE BIOGEL PI IND STRL 7.5 (GLOVE) ×1 IMPLANT
GLOVE BIOGEL PI INDICATOR 7.5 (GLOVE) ×2
GOWN STRL REUS W/ TWL LRG LVL3 (GOWN DISPOSABLE) ×3 IMPLANT
GOWN STRL REUS W/TWL LRG LVL3 (GOWN DISPOSABLE) ×9
HEMOSTAT SNOW SURGICEL 2X4 (HEMOSTASIS) ×2 IMPLANT
KIT BASIN OR (CUSTOM PROCEDURE TRAY) ×3 IMPLANT
KIT TURNOVER KIT B (KITS) ×3 IMPLANT
NS IRRIG 1000ML POUR BTL (IV SOLUTION) ×3 IMPLANT
PAD ARMBOARD 7.5X6 YLW CONV (MISCELLANEOUS) ×3 IMPLANT
POUCH RETRIEVAL ECOSAC 10 (ENDOMECHANICALS) IMPLANT
POUCH RETRIEVAL ECOSAC 10MM (ENDOMECHANICALS)
POUCH SPECIMEN RETRIEVAL 10MM (ENDOMECHANICALS) ×2 IMPLANT
SCISSORS LAP 5X35 DISP (ENDOMECHANICALS) ×3 IMPLANT
SET CHOLANGIOGRAPH 5 50 .035 (SET/KITS/TRAYS/PACK) ×3 IMPLANT
SET IRRIG TUBING LAPAROSCOPIC (IRRIGATION / IRRIGATOR) ×3 IMPLANT
SLEEVE ENDOPATH XCEL 5M (ENDOMECHANICALS) ×3 IMPLANT
SPECIMEN JAR SMALL (MISCELLANEOUS) ×3 IMPLANT
SPONGE GAUZE 2X2 STER 10/PKG (GAUZE/BANDAGES/DRESSINGS) ×2
STRIP CLOSURE SKIN 1/2X4 (GAUZE/BANDAGES/DRESSINGS) ×2 IMPLANT
SUT MNCRL AB 4-0 PS2 18 (SUTURE) ×3 IMPLANT
TOWEL OR 17X24 6PK STRL BLUE (TOWEL DISPOSABLE) ×3 IMPLANT
TOWEL OR 17X26 10 PK STRL BLUE (TOWEL DISPOSABLE) ×3 IMPLANT
TRAY LAPAROSCOPIC MC (CUSTOM PROCEDURE TRAY) ×3 IMPLANT
TROCAR XCEL BLUNT TIP 100MML (ENDOMECHANICALS) ×3 IMPLANT
TROCAR XCEL NON-BLD 11X100MML (ENDOMECHANICALS) ×3 IMPLANT
TROCAR XCEL NON-BLD 5MMX100MML (ENDOMECHANICALS) ×3 IMPLANT
TUBING INSUFFLATION (TUBING) ×3 IMPLANT
WATER STERILE IRR 1000ML POUR (IV SOLUTION) ×3 IMPLANT

## 2018-06-06 NOTE — Progress Notes (Signed)
PROGRESS NOTE  Thomas [name]  VHQ:[id number] DOB: 1941/08/04 DOA: 06/04/2018 PCP: Kaleen Mask, MD   Brief Narrative: Thomas [name] is a 77 y.o. male with a history of CAD, HTN, hypothyroidism who presented with abdominal pain radiating to the back with vomiting found to have gallstones on CT without pancreatic inflammation and acute cholecystitis with AKI. GI was consulted, recommending MRCP which showed GB wall thickening without CBD obstruction or dilatation. Surgery was consulted and performed laparoscopic cholecystectomy 06/06/2018. Plavix was momentarily held per cardiology recommendations.   Assessment & Plan: Principal Problem:   Acute pancreatitis Active Problems:   CAD (coronary artery disease)   HTN (hypertension)   Abdominal pain   ARF (acute renal failure) (HCC)   Gallstones   Preoperative cardiovascular examination  Acute cholecystitis with cholelithiasis: s/p cholecystectomy 6/26.  - On zosyn - Diet per general surgery - Monitor CMP  CAD s/p PCI to LAD 2008: He's had thrombus on the stent when off plavix, so he has been on indefinite plavix:  - Restart plavix in AM per surgery/cardiology discussions.  - Not on BB due to bradycardia.   AKI: SCr baseline ~1.3, up to 1.8 on admission. Improving with IVF's.  - Monitor in AM.   Hypothyroidism:  - Continue stable-dose synthroid.    DVT prophylaxis: SCDs Code Status: Full Family Communication: Wife, Daughter, SIL at bedside this PM Disposition Plan: Per general surgery. Anticipate DC home once stable.   Consultants:   General surgery  GI  Cardiology  Procedures:   Laparoscopic cholecystectomy 06/06/2018  Antimicrobials:  Zosyn   Subjective: Incisional tenderness post op, no N/V/D. Wants to get up and walk around and eager to advance diet.   Objective: Vitals:   06/06/18 1125 06/06/18 1140 06/06/18 1201 06/06/18 1538  BP: (!) 159/80 (!) 148/77 (!) 144/69 131/68  Pulse: 72 73 69 65    Resp: 18 20 20 18   Temp:  97.6 F (36.4 C) 98.2 F (36.8 C) 98.8 F (37.1 C)  TempSrc:   Oral Oral  SpO2: 96% 93% 95% 96%  Weight:      Height:        Intake/Output Summary (Last 24 hours) at 06/06/2018 1821 Last data filed at 06/06/2018 1408 Gross per 24 hour  Intake 1488.85 ml  Output 735 ml  Net 753.85 ml   Filed Weights   06/05/18 0408 06/06/18 0526 06/06/18 0915  Weight: 94.8 kg (209 lb 1.6 oz) 94.5 kg (208 lb 4.8 oz) 94.5 kg (208 lb 4.8 oz)    Gen: 77 y.o. male in no distress  Pulm: Non-labored breathing. Clear to auscultation bilaterally.  CV: Regular rate and rhythm. No murmur, rub, or gallop. No JVD, no pedal edema. GI: Abdomen soft, non-distended, with normoactive bowel sounds. No organomegaly or masses felt. Mildly, appropriately tender without and rigidity.  Ext: Warm, no deformities Skin: Laparoscopic incision sites with dried blood, no active oozing.  Neuro: Alert and oriented. No focal neurological deficits. Psych: Judgement and insight appear normal. Mood & affect appropriate.   Data Reviewed: I have personally reviewed following labs and imaging studies  CBC: Recent Labs  Lab 06/04/18 1745 06/04/18 2357 06/06/18 0632  WBC 11.3* 5.8 4.4  NEUTROABS 8.9*  --   --   HGB 14.5 13.2 13.2  HCT 46.4 41.0 41.0  MCV 106.7* 104.9* 105.9*  PLT 247 205 181   Basic Metabolic Panel: Recent Labs  Lab 06/04/18 1745 06/04/18 2357 06/06/18 0632  NA 144 144 141  K  3.6 4.3 3.9  CL 106 110 108  CO2 25 26 26   GLUCOSE 116* 107* 90  BUN 17 16 13   CREATININE 1.81* 1.65* 1.35*  CALCIUM 9.5 8.8* 8.5*   GFR: Estimated Creatinine Clearance: 55.6 mL/min (A) (by C-G formula based on SCr of 1.35 mg/dL (H)). Liver Function Tests: Recent Labs  Lab 06/04/18 1745 06/04/18 2357 06/06/18 0632  AST 38 62* 28  ALT 27 55* 41  ALKPHOS 61 58 55  BILITOT 1.6* 0.9 1.4*  PROT 6.7 5.8* 5.4*  ALBUMIN 4.1 3.5 3.1*   Recent Labs  Lab 06/04/18 1745 06/06/18 0632  LIPASE  250* 36   No results for input(s): AMMONIA in the last 168 hours. Coagulation Profile: Recent Labs  Lab 06/04/18 1745  INR 1.11   Cardiac Enzymes: Recent Labs  Lab 06/04/18 2357  TROPONINI <0.03   BNP (last 3 results) No results for input(s): PROBNP in the last 8760 hours. HbA1C: No results for input(s): HGBA1C in the last 72 hours. CBG: Recent Labs  Lab 06/05/18 1641 06/05/18 2056 06/06/18 0747 06/06/18 1212 06/06/18 1535  GLUCAP 77 80 83 90 134*   Lipid Profile: Recent Labs    06/04/18 2357  TRIG 61   Thyroid Function Tests: No results for input(s): TSH, T4TOTAL, FREET4, T3FREE, THYROIDAB in the last 72 hours. Anemia Panel: No results for input(s): VITAMINB12, FOLATE, FERRITIN, TIBC, IRON, RETICCTPCT in the last 72 hours. Urine analysis:    Component Value Date/Time   COLORURINE YELLOW 04/27/2015 1033   APPEARANCEUR CLEAR 04/27/2015 1033   LABSPEC 1.018 04/27/2015 1033   PHURINE 8.0 04/27/2015 1033   GLUCOSEU NEGATIVE 04/27/2015 1033   HGBUR NEGATIVE 04/27/2015 1033   BILIRUBINUR NEGATIVE 04/27/2015 1033   KETONESUR NEGATIVE 04/27/2015 1033   PROTEINUR NEGATIVE 04/27/2015 1033   UROBILINOGEN 0.2 04/27/2015 1033   NITRITE NEGATIVE 04/27/2015 1033   LEUKOCYTESUR NEGATIVE 04/27/2015 1033   No results found for this or any previous visit (from the past 240 hour(s)).    Radiology Studies: Dg Cholangiogram Operative  Result Date: 06/06/2018 CLINICAL DATA:  Cholelithiasis EXAM: INTRAOPERATIVE CHOLANGIOGRAM TECHNIQUE: Cholangiographic images from the C-arm fluoroscopic device were submitted for interpretation post-operatively. Please see the procedural report for the amount of contrast and the fluoroscopy time utilized. COMPARISON:  06/05/2018 FINDINGS: Intraoperative cholangiogram performed during the laparoscopic procedure. The biliary confluence, common hepatic duct, and common bile duct are patent. Contrast drains freely into the duodenum. No dilatation,  obstruction, stricture or significant filling defect. Gallstones noted correlating with the MRI. IMPRESSION: Cholelithiasis. Patent biliary system. Electronically Signed   By: Judie Petit.  Shick M.D.   On: 06/06/2018 11:44   Ct Abdomen Pelvis W Contrast  Result Date: 06/04/2018 CLINICAL DATA:  77 y/o M; epigastric abdominal pain with nausea vomiting. EXAM: CT ABDOMEN AND PELVIS WITH CONTRAST TECHNIQUE: Multidetector CT imaging of the abdomen and pelvis was performed using the standard protocol following bolus administration of intravenous contrast. CONTRAST:  80mL OMNIPAQUE IOHEXOL 300 MG/ML  SOLN COMPARISON:  None. FINDINGS: Lower chest: Moderate coronary artery calcific atherosclerosis. 3-4 mm right middle lobe calcified granuloma. Hepatobiliary: No focal liver abnormality is seen. Cholelithiasis. No gallbladder wall thickening or biliary ductal dilatation. Pancreas: Unremarkable. No pancreatic ductal dilatation or surrounding inflammatory changes. Spleen: Normal in size without focal abnormality. Adrenals/Urinary Tract: 13 mm right kidney interpolar cyst and subcentimeter left kidney upper pole cyst. Multiple nonobstructing right-sided kidney stones measuring up to 5 mm. No hydronephrosis. Normal bladder. Stomach/Bowel: Stomach is within normal limits. Appendix appears normal.  No evidence of bowel wall thickening, distention, or inflammatory changes. Sigmoid diverticulosis without findings of acute diverticulitis. 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 Calcific aortic atherosclerosis. Reproductive: Obscured by streak artifact from hip hardware. Other: No abdominal wall hernia or abnormality. No abdominopelvic ascites. Musculoskeletal: Bilateral hip arthroplasty. Advanced multilevel disc and facet degenerative changes of the spine. 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 IMPRESSION: 1. No acute process identified. 2. Coronary and aortic calcific atherosclerosis. 3.  Cholelithiasis. 4. Multiple nonobstructing right kidney stones measuring up 5 mm. 5. 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 6. 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 Recommend followup by ultrasound in 5 years. This recommendation follows ACR consensus guidelines: White Paper of the ACR Incidental Findings Committee II on Vascular Findings. J Am Coll Radiol 2013; 10:789-794. Electronically Signed   By: Mitzi HansenLance  Furusawa-Stratton M.D.   On: 06/04/2018 20:38   Mr Abdomen Mrcp Wo Contrast  Result Date: 06/05/2018 CLINICAL DATA:  Epigastric pain and burning.  Cholelithiasis. EXAM: MRI ABDOMEN WITHOUT CONTRAST  (INCLUDING MRCP) TECHNIQUE: Multiplanar multisequence MR imaging of the abdomen was performed. Heavily T2-weighted images of the biliary and pancreatic ducts were obtained, and three-dimensional MRCP images were rendered by post processing. COMPARISON:  CT scan dated 06/04/2018 FINDINGS: Lower chest: Small type 1 hiatal hernia. Hepatobiliary: For gallstones measuring up to 1.8 cm in long axis are observed in the gallbladder with mild gallbladder wall thickening. No intrahepatic or extrahepatic biliary dilatation. No directly visualized choledocholithiasis. No specific hepatic parenchymal lesions. Pancreas: Unremarkable noncontrast appearance of the pancreas. No dorsal pancreatic duct dilatation. Spleen:  Unremarkable Adrenals/Urinary Tract: 9 mm fluid signal intensity lesion of the right mid kidney laterally. 5 mm fluid signal intensity lesion of the left kidney upper pole. No hydronephrosis. The patient has known right nephrolithiasis more apparent on the CT scan. Stomach/Bowel: Nondistended stomach.  Otherwise unremarkable. Vascular/Lymphatic: Aortoiliac atherosclerotic vascular disease. Small porta hepatis lymph nodes on image 15/9 are not pathologically enlarged by size criteria. Other:  No supplemental non-categorized findings.  Musculoskeletal: Thoracolumbar spondylosis and degenerative disc disease. The patient has known pars defects at L5. IMPRESSION: 1. Cholelithiasis with mild gallbladder wall thickening. Correlate clinically in assessing for acute cholecystitis. If further imaging workup for cholecystitis is indicated, nuclear medicine hepatobiliary scan could be utilized to assess for cystic duct patency. 2. Small type 1 hiatal hernia. 3. Known right nephrolithiasis, better shown on CT. 4.  Aortic Atherosclerosis (ICD10-I70.0). 5. 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 Electronically Signed   By: Gaylyn RongWalter  Liebkemann M.D.   On: 06/05/2018 07:11   Dg Abd Acute W/chest  Result Date: 06/04/2018 CLINICAL DATA:  77 y/o M; she central chest pain and epigastric pain. Radiates to the back. Vomiting. EXAM: DG ABDOMEN ACUTE W/ 1V CHEST COMPARISON:  07/30/2015 chest radiograph FINDINGS: There is no evidence of dilated bowel loops or free intraperitoneal air. No radiopaque calculi or other significant radiographic abnormality is seen. Heart size and mediastinal contours are within normal limits. Both lungs are clear. Partially visualized hip prostheses. Degenerative changes of the spine with mild thoracolumbar dextrocurvature. IMPRESSION: Negative abdominal radiographs.  No acute cardiopulmonary disease. Electronically Signed   By: Mitzi HansenLance  Furusawa-Stratton M.D.   On: 06/04/2018 18:53    Scheduled Meds: . aspirin  81 mg Oral Daily  . [START ON 06/07/2018] clopidogrel  75 mg Oral Q breakfast  . fentaNYL      .  levothyroxine  150 mcg Oral QAC breakfast  . simvastatin  40 mg Oral q1800   Continuous Infusions: . dextrose 5 % and 0.45% NaCl 75 mL/hr at 06/06/18 1217  . famotidine (PEPCID) IV 20 mg (06/05/18 2314)  . lactated ringers 10 mL/hr at 06/06/18 0922     LOS: 2 days   Time spent: 25 minutes.  Tyrone Nine, MD [facility] [email or web address] Password [facility] 06/06/2018, 6:21  PM

## 2018-06-06 NOTE — Anesthesia Preprocedure Evaluation (Signed)
Anesthesia Evaluation  Patient identified by MRN, date of birth, ID band Patient awake    Reviewed: Allergy & Precautions, NPO status , Patient's Chart, lab work & pertinent test results  Airway Mallampati: III  TM Distance: >3 FB Neck ROM: Full    Dental  (+) Teeth Intact, Dental Advisory Given   Pulmonary Current Smoker,    breath sounds clear to auscultation       Cardiovascular hypertension,  Rhythm:Regular Rate:Normal     Neuro/Psych    GI/Hepatic   Endo/Other    Renal/GU      Musculoskeletal   Abdominal (+) + obese,   Peds  Hematology   Anesthesia Other Findings   Reproductive/Obstetrics                             Anesthesia Physical Anesthesia Plan  ASA: III  Anesthesia Plan: General   Post-op Pain Management:    Induction: Intravenous  PONV Risk Score and Plan: Ondansetron and Dexamethasone  Airway Management Planned: Oral ETT  Additional Equipment:   Intra-op Plan:   Post-operative Plan: Extubation in OR  Informed Consent: I have reviewed the patients History and Physical, chart, labs and discussed the procedure including the risks, benefits and alternatives for the proposed anesthesia with the patient or authorized representative who has indicated his/her understanding and acceptance.   Dental advisory given  Plan Discussed with: CRNA and Anesthesiologist  Anesthesia Plan Comments:         Anesthesia Quick Evaluation  

## 2018-06-06 NOTE — Anesthesia Postprocedure Evaluation (Signed)
Anesthesia Post Note  Patient: Thomas [name]  Procedure(s) Performed: LAPAROSCOPIC CHOLECYSTECTOMY WITH INTRAOPERATIVE CHOLANGIOGRAM (N/A Abdomen)     Patient location during evaluation: PACU Anesthesia Type: General Level of consciousness: awake and alert Pain management: pain level controlled Vital Signs Assessment: post-procedure vital signs reviewed and stable Respiratory status: spontaneous breathing, nonlabored ventilation, respiratory function stable and patient connected to nasal cannula oxygen Cardiovascular status: blood pressure returned to baseline and stable Postop Assessment: no apparent nausea or vomiting Anesthetic complications: no    Last Vitals:  Vitals:   06/06/18 1140 06/06/18 1201  BP: (!) 148/77 (!) 144/69  Pulse: 73 69  Resp: 20 20  Temp: 36.4 C 36.8 C  SpO2: 93% 95%    Last Pain:  Vitals:   06/06/18 1201  TempSrc: Oral  PainSc:                  Elanda Garmany COKER

## 2018-06-06 NOTE — Progress Notes (Signed)
Subjective/Chief Complaint: Patient is much more comfortable today.  Minimal tenderness to palpation Hungry  Cardiology has not yet been called to see the patient.  His Plavix was held yesterday and he has not had a dose today.   Objective: Vital signs in last 24 hours: Temp:  [97.7 F (36.5 C)-98.1 F (36.7 C)] 98.1 F (36.7 C) (06/26 0526) Pulse Rate:  [56-62] 61 (06/26 0526) Resp:  [18] 18 (06/26 0526) BP: (112-139)/(58-72) 133/64 (06/26 0526) SpO2:  [95 %-98 %] 98 % (06/26 0526) Weight:  [94.5 kg (208 lb 4.8 oz)] 94.5 kg (208 lb 4.8 oz) (06/26 0526) Last BM Date: 06/03/18  Intake/Output from previous day: 06/25 0701 - 06/26 0700 In: 1575.9 [P.O.:620; I.V.:911.3; IV Piggyback:44.6] Out: 975 [Urine:975] Intake/Output this shift: No intake/output data recorded.  General appearance: alert, cooperative and no distress Resp: clear to auscultation bilaterally Cardio: regular rate and rhythm, S1, S2 normal, no murmur, click, rub or gallop GI: soft, minimal epigastric tenderness No jaundice  Lab Results:  Recent Labs    06/04/18 2357 06/06/18 0632  WBC 5.8 4.4  HGB 13.2 13.2  HCT 41.0 41.0  PLT 205 181   BMET Recent Labs    06/04/18 1745 06/04/18 2357  NA 144 144  K 3.6 4.3  CL 106 110  CO2 25 26  GLUCOSE 116* 107*  BUN 17 16  CREATININE 1.81* 1.65*  CALCIUM 9.5 8.8*    Hepatic Function Latest Ref Rng & Units 06/04/2018 06/04/2018 04/02/2018  Total Protein 6.5 - 8.1 g/dL 5.8(L) 6.7 6.2  Albumin 3.5 - 5.0 g/dL 3.5 4.1 4.2  AST 15 - 41 U/L 62(H) 38 15  ALT 0 - 44 U/L 55(H) 27 14  Alk Phosphatase 38 - 126 U/L 58 61 65  Total Bilirubin 0.3 - 1.2 mg/dL 0.9 1.6(H) 0.7  Bilirubin, Direct 0.0 - 0.2 mg/dL 0.2 - 0.19    Lipase     Component Value Date/Time   LIPASE 250 (H) 06/04/2018 1745  Repeat labs pending today   PT/INR Recent Labs    06/04/18 1745  LABPROT 14.3  INR 1.11   ABG No results for input(s): PHART, HCO3 in the last 72  hours.  Invalid input(s): PCO2, PO2  Studies/Results: Ct Abdomen Pelvis W Contrast  Result Date: 06/04/2018 CLINICAL DATA:  77 y/o M; epigastric abdominal pain with nausea vomiting. EXAM: CT ABDOMEN AND PELVIS WITH CONTRAST TECHNIQUE: Multidetector CT imaging of the abdomen and pelvis was performed using the standard protocol following bolus administration of intravenous contrast. CONTRAST:  27m OMNIPAQUE IOHEXOL 300 MG/ML  SOLN COMPARISON:  None. FINDINGS: Lower chest: Moderate coronary artery calcific atherosclerosis. 3-4 mm right middle lobe calcified granuloma. Hepatobiliary: No focal liver abnormality is seen. Cholelithiasis. No gallbladder wall thickening or biliary ductal dilatation. Pancreas: Unremarkable. No pancreatic ductal dilatation or surrounding inflammatory changes. Spleen: Normal in size without focal abnormality. Adrenals/Urinary Tract: 13 mm right kidney interpolar cyst and subcentimeter left kidney upper pole cyst. Multiple nonobstructing right-sided kidney stones measuring up to 5 mm. No hydronephrosis. Normal bladder. Stomach/Bowel: Stomach is within normal limits. Appendix appears normal. No evidence of bowel wall thickening, distention, or inflammatory changes. Sigmoid diverticulosis without findings of acute diverticulitis. 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 Calcific aortic atherosclerosis. Reproductive: Obscured by streak artifact from hip hardware. Other: No abdominal wall hernia or abnormality. No abdominopelvic ascites. Musculoskeletal: Bilateral hip arthroplasty. Advanced multilevel disc and facet degenerative changes of the spine. Grade 1 L5-S1 anterolisthesis with chronic L5  pars defects. IMPRESSION: 1. No acute process identified. 2. Coronary and aortic calcific atherosclerosis. 3. Cholelithiasis. 4. Multiple nonobstructing right kidney stones measuring up 5 mm. 5. 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 6. Abdominal  aortic ectasia to 2.8 cm. Ectatic abdominal aorta at risk for aneurysm development. Recommend followup by ultrasound in 5 years. This recommendation follows ACR consensus guidelines: White Paper of the ACR Incidental Findings Committee II on Vascular Findings. J Am Coll Radiol 2013; 10:789-794. Electronically Signed   By: Lance  Furusawa-Stratton M.D.   On: 06/04/2018 20:38   Mr Abdomen Mrcp Wo Contrast  Result Date: 06/05/2018 CLINICAL DATA:  Epigastric pain and burning.  Cholelithiasis. EXAM: MRI ABDOMEN WITHOUT CONTRAST  (INCLUDING MRCP) TECHNIQUE: Multiplanar multisequence MR imaging of the abdomen was performed. Heavily T2-weighted images of the biliary and pancreatic ducts were obtained, and three-dimensional MRCP images were rendered by post processing. COMPARISON:  CT scan dated 06/04/2018 FINDINGS: Lower chest: Small type 1 hiatal hernia. Hepatobiliary: For gallstones measuring up to 1.8 cm in long axis are observed in the gallbladder with mild gallbladder wall thickening. No intrahepatic or extrahepatic biliary dilatation. No directly visualized choledocholithiasis. No specific hepatic parenchymal lesions. Pancreas: Unremarkable noncontrast appearance of the pancreas. No dorsal pancreatic duct dilatation. Spleen:  Unremarkable Adrenals/Urinary Tract: 9 mm fluid signal intensity lesion of the right mid kidney laterally. 5 mm fluid signal intensity lesion of the left kidney upper pole. No hydronephrosis. The patient has known right nephrolithiasis more apparent on the CT scan. Stomach/Bowel: Nondistended stomach.  Otherwise unremarkable. Vascular/Lymphatic: Aortoiliac atherosclerotic vascular disease. Small porta hepatis lymph nodes on image 15/9 are not pathologically enlarged by size criteria. Other:  No supplemental non-categorized findings. Musculoskeletal: Thoracolumbar spondylosis and degenerative disc disease. The patient has known pars defects at L5. IMPRESSION: 1. Cholelithiasis with mild  gallbladder wall thickening. Correlate clinically in assessing for acute cholecystitis. If further imaging workup for cholecystitis is indicated, nuclear medicine hepatobiliary scan could be utilized to assess for cystic duct patency. 2. Small type 1 hiatal hernia. 3. Known right nephrolithiasis, better shown on CT. 4.  Aortic Atherosclerosis (ICD10-I70.0). 5. 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 Electronically Signed   By: Walter  Liebkemann M.D.   On: 06/05/2018 07:11   Dg Abd Acute W/chest  Result Date: 06/04/2018 CLINICAL DATA:  77 y/o M; she central chest pain and epigastric pain. Radiates to the back. Vomiting. EXAM: DG ABDOMEN ACUTE W/ 1V CHEST COMPARISON:  07/30/2015 chest radiograph FINDINGS: There is no evidence of dilated bowel loops or free intraperitoneal air. No radiopaque calculi or other significant radiographic abnormality is seen. Heart size and mediastinal contours are within normal limits. Both lungs are clear. Partially visualized hip prostheses. Degenerative changes of the spine with mild thoracolumbar dextrocurvature. IMPRESSION: Negative abdominal radiographs.  No acute cardiopulmonary disease. Electronically Signed   By: Lance  Furusawa-Stratton M.D.   On: 06/04/2018 18:53    Anti-infectives: Anti-infectives (From admission, onward)   Start     Dose/Rate Route Frequency Ordered Stop   06/05/18 2000  piperacillin-tazobactam (ZOSYN) IVPB 3.375 g     3.375 g 12.5 mL/hr over 240 Minutes Intravenous Every 8 hours 06/05/18 1849        Assessment/Plan: Acute calculus cholecystitis Possible choledocholithiasis causing mild pancreatitis and transiently elevated T. Bili - the stone probably passed since there were no findings on MRCP. CAD - on Plavix  Plan laparoscopic cholecystectomy with intraoperative cholangiogram today.  I have called Cardiology to   see the patient and we will plan surgery later today.  He has missed one day of  Plavix and can restart it right after surgery later today.  The surgical procedure has been discussed with the patient.  Potential risks, benefits, alternative treatments, and expected outcomes have been explained.  All of the patient's questions at this time have been answered.  The likelihood of reaching the patient's treatment goal is good.  The patient understand the proposed surgical procedure and wishes to proceed.   LOS: 2 days    Maia Petties 06/06/2018

## 2018-06-06 NOTE — Plan of Care (Signed)
°  Problem: Clinical Measurements: °Goal: Ability to maintain clinical measurements within normal limits will improve °Outcome: Progressing °  °Problem: Clinical Measurements: °Goal: Diagnostic test results will improve °Outcome: Progressing °  °Problem: Nutrition: °Goal: Adequate nutrition will be maintained °Outcome: Progressing °  °Problem: Pain Managment: °Goal: General experience of comfort will improve °Outcome: Progressing °  °

## 2018-06-06 NOTE — Consult Note (Signed)
Cardiology Consultation:   Patient ID: COLLIS THEDE; [id number]; 1941/05/25   Admit date: 06/04/2018 Date of Consult: 06/06/2018  Primary Care Provider: Kaleen Mask, MD Primary Cardiologist: Kristeen Miss, MD   Patient Profile:   Thomas [name] is a 77 y.o. male with a hx of CAD s/p DES to LAD, hypertension, hypothyroidism, GERD, arthritis status post hip replacement x2 who is being seen today for the preoperative evaluation  at the request of Dr. Jarvis Newcomer.  History of Present Illness:   Thomas [name] has a history of CAD with a stent in his left anterior descending artery.  The proximal edge of the stent hangs out into the left main.  He had thrombus formation on the proximal edge of the stent that embolized down to the left circumflex artery when he was off Plavix.  Because of this he has been kept on lifelong Plavix.  He did undergo hip replacement in 2014 & 2016 without interruption and Plavix and did well.  He was last seen in the office by Dr. Elease Hashimoto on 04/02/18 at which time he was doing well.  He was walking 2 miles a day approximately twice a week with no exertional chest discomfort or dyspnea.  His aspirin was reduced from 325 mg to 81 mg at the time.   Currently the patient is admitted for acute calculus cholecystitis.  He is planned for laparoscopic cholecystectomy today at about 1030.  Cardiology has been called for input on Plavix.  His Plavix was held yesterday and today.  The surgeon states that he hopes to do the surgery this morning and resume the Plavix this afternoon.  Thomas [name] is doing well from a cardiac standpoint.  He walks 1-2 times per week approximately 2 miles at his local track and also walks to the mailbox every day which is approximately 0.4 miles without any exertional chest discomfort or dyspnea.  He has no orthopnea, PND, edema, palpitations, dizziness.  He does not smoke, having quit in 2016.  Past Medical History:  Diagnosis Date  . Arthritis   .  Coronary artery disease    takes Plavix and ASA daily  . GERD (gastroesophageal reflux disease)    occasional and will take Tums if needed  . History of blood clots 2008   in chest  . History of kidney stones late 90's  . Hyperlipidemia    takes Simvastatin daily  . Hypertension    takes Maxzide daily  . Hypothyroidism    takes Synthroid daily  . Joint pain   . Joint swelling    left knee  . Pneumonia    at 77 years old, double    Past Surgical History:  Procedure Laterality Date  . CARDIAC CATHETERIZATION    . CORONARY ANGIOPLASTY  05/21/2007   lad stent/taxus stent  . HEMORROIDECTOMY  late 1970's  . HERNIA REPAIR Right 1980's  . TOTAL HIP ARTHROPLASTY Right 10/28/2013   Procedure: RIGHT TOTAL HIP ARTHROPLASTY ANTERIOR APPROACH;  Surgeon: Loanne Drilling, MD;  Location: [facility];  Service: Orthopedics;  Laterality: Right;  . TOTAL HIP ARTHROPLASTY Left 05/08/2015   Procedure: LEFT TOTAL HIP ARTHROPLASTY ANTERIOR APPROACH;  Surgeon: Ollen Gross, MD;  Location: [facility];  Service: Orthopedics;  Laterality: Left;     Home Medications:  Prior to Admission medications   Medication Sig Start Date End Date Taking? Authorizing Provider  aspirin EC 81 MG tablet Take 1 tablet (81 mg total) by mouth daily. 04/02/18  Yes Nahser, Deloris Ping,  MD  clopidogrel (PLAVIX) 75 MG tablet Take 75 mg by mouth daily with breakfast.   Yes [provider]  levothyroxine (SYNTHROID, LEVOTHROID) 150 MCG tablet Take 150 mcg by mouth daily. 01/04/18  Yes [provider]  lisinopril (PRINIVIL,ZESTRIL) 20 MG tablet Take 10 mg by mouth daily.   Yes [provider]  nitroGLYCERIN (NITROSTAT) 0.4 MG SL tablet Place 1 tablet (0.4 mg total) under the tongue every 5 (five) minutes as needed for chest pain. 04/02/18 07/01/18 Yes Nahser, Deloris Ping, MD  ranitidine (ZANTAC) 150 MG tablet Take 150 mg by mouth 2 (two) times daily as needed for heartburn.   Yes [provider]  simvastatin  (ZOCOR) 40 MG tablet Take 40 mg by mouth every evening.   Yes [provider]    Inpatient Medications: Scheduled Meds: . aspirin  81 mg Oral Daily  . levothyroxine  150 mcg Oral QAC breakfast  . simvastatin  40 mg Oral q1800   Continuous Infusions: . dextrose 5 % and 0.45% NaCl 75 mL/hr at 06/05/18 1932  . famotidine (PEPCID) IV 20 mg (06/05/18 2314)  . piperacillin-tazobactam (ZOSYN)  IV 3.375 g (06/06/18 0420)   PRN Meds: acetaminophen **OR** acetaminophen, fentaNYL (SUBLIMAZE) injection, hydrALAZINE, ondansetron **OR** ondansetron (ZOFRAN) IV  Allergies:    Allergies  Allergen Reactions  . Oxycodone Nausea Only  . Robaxin [Methocarbamol] Nausea Only  . Tramadol Nausea Only and Other (See Comments)    Hallucinations    Social History:   Social History   Socioeconomic History  . Marital status: Married    Spouse name: Not on file  . Number of children: Not on file  . Years of education: Not on file  . Highest education level: Not on file  Occupational History  . Not on file  Social Needs  . Financial resource strain: Not on file  . Food insecurity:    Worry: Not on file    Inability: Not on file  . Transportation needs:    Medical: Not on file    Non-medical: Not on file  Tobacco Use  . Smoking status: Current Every Day Smoker    Packs/day: 0.25    Years: 52.00    Pack years: 13.00    Types: Cigarettes  . Smokeless tobacco: Never Used  . Tobacco comment: trying to quit  Substance and Sexual Activity  . Alcohol use: No  . Drug use: No  . Sexual activity: Not on file  Lifestyle  . Physical activity:    Days per week: Not on file    Minutes per session: Not on file  . Stress: Not on file  Relationships  . Social connections:    Talks on phone: Not on file    Gets together: Not on file    Attends religious service: Not on file    Active member of club or organization: Not on file    Attends meetings of clubs or organizations: Not on file     Relationship status: Not on file  . Intimate partner violence:    Fear of current or ex partner: Not on file    Emotionally abused: Not on file    Physically abused: Not on file    Forced sexual activity: Not on file  Other Topics Concern  . Not on file  Social History Narrative  . Not on file    Family History:    Family History  Problem Relation Age of Onset  . CAD Mother   .  Heart failure Mother   . Pneumonia Father   . Heart failure Father      ROS:  Please see the history of present illness.   All other ROS reviewed and negative.     Physical Exam/Data:   Vitals:   06/05/18 1149 06/05/18 1548 06/05/18 1945 06/06/18 0526  BP: 118/65 136/69 139/72 133/64  Pulse: (!) 59 (!) 56 62 61  Resp:   18 18  Temp: 97.7 F (36.5 C) 97.8 F (36.6 C) 97.9 F (36.6 C) 98.1 F (36.7 C)  TempSrc: Oral Oral Oral Oral  SpO2: 97% 98% 95% 98%  Weight:    208 lb 4.8 oz (94.5 kg)  Height:        Intake/Output Summary (Last 24 hours) at 06/06/2018 0754 Last data filed at 06/06/2018 0600 Gross per 24 hour  Intake 1575.9 ml  Output 975 ml  Net 600.9 ml   Filed Weights   06/04/18 2247 06/05/18 0408 06/06/18 0526  Weight: 211 lb (95.7 kg) 209 lb 1.6 oz (94.8 kg) 208 lb 4.8 oz (94.5 kg)   Body mass index is 28.25 kg/m.  General:  Well nourished, well developed, in no acute distress HEENT: normal Lymph: no adenopathy Neck: no JVD Endocrine:  No thryomegaly Vascular: No carotid bruits; FA pulses 2+ bilaterally without bruits  Cardiac:  normal S1, S2; RRR; no murmur  Lungs:  clear to auscultation bilaterally, no wheezing, rhonchi or rales  Abd: soft, nontender, no hepatomegaly  Ext: no edema Musculoskeletal:  No deformities, BUE and BLE strength normal and equal Skin: warm and dry  Neuro:  CNs 2-12 intact, no focal abnormalities noted Psych:  Normal affect   EKG:  The EKG was personally reviewed and demonstrates: Sinus rhythm at 75 bpm and known right bundle branch  block Telemetry:  Telemetry was personally reviewed and demonstrates: This rhythm with rates in the 50s-60s  Relevant CV Studies:  Stress test 2014- No evidence of ischemia  Laboratory Data:  Chemistry Recent Labs  Lab 06/04/18 1745 06/04/18 2357  NA 144 144  K 3.6 4.3  CL 106 110  CO2 25 26  GLUCOSE 116* 107*  BUN 17 16  CREATININE 1.81* 1.65*  CALCIUM 9.5 8.8*  GFRNONAA 35* 39*  GFRAA 40* 45*  ANIONGAP 13 8    Recent Labs  Lab 06/04/18 1745 06/04/18 2357  PROT 6.7 5.8*  ALBUMIN 4.1 3.5  AST 38 62*  ALT 27 55*  ALKPHOS 61 58  BILITOT 1.6* 0.9   Hematology Recent Labs  Lab 06/04/18 1745 06/04/18 2357 06/06/18 0632  WBC 11.3* 5.8 4.4  RBC 4.35 3.91* 3.87*  HGB 14.5 13.2 13.2  HCT 46.4 41.0 41.0  MCV 106.7* 104.9* 105.9*  MCH 33.3 33.8 34.1*  MCHC 31.3 32.2 32.2  RDW 13.3 13.4 13.2  PLT 247 205 181   Cardiac Enzymes Recent Labs  Lab 06/04/18 2357  TROPONINI <0.03    Recent Labs  Lab 06/04/18 1822  TROPIPOC 0.00    BNPNo results for input(s): BNP, PROBNP in the last 168 hours.  DDimer No results for input(s): DDIMER in the last 168 hours.  Radiology/Studies:  Ct Abdomen Pelvis W Contrast  Result Date: 06/04/2018 CLINICAL DATA:  77 y/o M; epigastric abdominal pain with nausea vomiting. EXAM: CT ABDOMEN AND PELVIS WITH CONTRAST TECHNIQUE: Multidetector CT imaging of the abdomen and pelvis was performed using the standard protocol following bolus administration of intravenous contrast. CONTRAST:  80mL OMNIPAQUE IOHEXOL 300 MG/ML  SOLN COMPARISON:  None. FINDINGS: Lower chest: Moderate coronary artery calcific atherosclerosis. 3-4 mm right middle lobe calcified granuloma. Hepatobiliary: No focal liver abnormality is seen. Cholelithiasis. No gallbladder wall thickening or biliary ductal dilatation. Pancreas: Unremarkable. No pancreatic ductal dilatation or surrounding inflammatory changes. Spleen: Normal in size without focal abnormality. Adrenals/Urinary  Tract: 13 mm right kidney interpolar cyst and subcentimeter left kidney upper pole cyst. Multiple nonobstructing right-sided kidney stones measuring up to 5 mm. No hydronephrosis. Normal bladder. Stomach/Bowel: Stomach is within normal limits. Appendix appears normal. No evidence of bowel wall thickening, distention, or inflammatory changes. Sigmoid diverticulosis without findings of acute diverticulitis. 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 Calcific aortic atherosclerosis. Reproductive: Obscured by streak artifact from hip hardware. Other: No abdominal wall hernia or abnormality. No abdominopelvic ascites. Musculoskeletal: Bilateral hip arthroplasty. Advanced multilevel disc and facet degenerative changes of the spine. 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 IMPRESSION: 1. No acute process identified. 2. Coronary and aortic calcific atherosclerosis. 3. Cholelithiasis. 4. Multiple nonobstructing right kidney stones measuring up 5 mm. 5. 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 6. 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 Recommend followup by ultrasound in 5 years. This recommendation follows ACR consensus guidelines: White Paper of the ACR Incidental Findings Committee II on Vascular Findings. J Am Coll Radiol 2013; 10:789-794. Electronically Signed   By: Mitzi Hansen M.D.   On: 06/04/2018 20:38   Mr Abdomen Mrcp Wo Contrast  Result Date: 06/05/2018 CLINICAL DATA:  Epigastric pain and burning.  Cholelithiasis. EXAM: MRI ABDOMEN WITHOUT CONTRAST  (INCLUDING MRCP) TECHNIQUE: Multiplanar multisequence MR imaging of the abdomen was performed. Heavily T2-weighted images of the biliary and pancreatic ducts were obtained, and three-dimensional MRCP images were rendered by post processing. COMPARISON:  CT scan dated 06/04/2018 FINDINGS: Lower chest: Small type 1 hiatal hernia.  Hepatobiliary: For gallstones measuring up to 1.8 cm in long axis are observed in the gallbladder with mild gallbladder wall thickening. No intrahepatic or extrahepatic biliary dilatation. No directly visualized choledocholithiasis. No specific hepatic parenchymal lesions. Pancreas: Unremarkable noncontrast appearance of the pancreas. No dorsal pancreatic duct dilatation. Spleen:  Unremarkable Adrenals/Urinary Tract: 9 mm fluid signal intensity lesion of the right mid kidney laterally. 5 mm fluid signal intensity lesion of the left kidney upper pole. No hydronephrosis. The patient has known right nephrolithiasis more apparent on the CT scan. Stomach/Bowel: Nondistended stomach.  Otherwise unremarkable. Vascular/Lymphatic: Aortoiliac atherosclerotic vascular disease. Small porta hepatis lymph nodes on image 15/9 are not pathologically enlarged by size criteria. Other:  No supplemental non-categorized findings. Musculoskeletal: Thoracolumbar spondylosis and degenerative disc disease. The patient has known pars defects at L5. IMPRESSION: 1. Cholelithiasis with mild gallbladder wall thickening. Correlate clinically in assessing for acute cholecystitis. If further imaging workup for cholecystitis is indicated, nuclear medicine hepatobiliary scan could be utilized to assess for cystic duct patency. 2. Small type 1 hiatal hernia. 3. Known right nephrolithiasis, better shown on CT. 4.  Aortic Atherosclerosis (ICD10-I70.0). 5. 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 Electronically Signed   By: Gaylyn Rong M.D.   On: 06/05/2018 07:11   Dg Abd Acute W/chest  Result Date: 06/04/2018 CLINICAL DATA:  77 y/o M; she central chest pain and epigastric pain. Radiates to the back. Vomiting. EXAM: DG ABDOMEN ACUTE W/ 1V CHEST COMPARISON:  07/30/2015 chest radiograph FINDINGS: There is no evidence of dilated bowel loops or free intraperitoneal air. No radiopaque  calculi or other  significant radiographic abnormality is seen. Heart size and mediastinal contours are within normal limits. Both lungs are clear. Partially visualized hip prostheses. Degenerative changes of the spine with mild thoracolumbar dextrocurvature. IMPRESSION: Negative abdominal radiographs.  No acute cardiopulmonary disease. Electronically Signed   By: Mitzi Hansen M.D.   On: 06/04/2018 18:53    Assessment and Plan:   Preoperative evaluation -admitted for acute calculus cholecystitis.  He is planned for laparoscopic cholecystectomy today at about 1030. - history of CAD with a stent in his left anterior descending artery.  The proximal edge of the stent hangs out into the left main.  He had thrombus formation on the proximal edge of the stent that embolized down to the left circumflex artery when he was off Plavix.  Because of this he has been kept on lifelong Plavix.  He did undergo hip replacement in 2014 & 2016 without interruption in Plavix and did well. -His Plavix has been held yesterday and today, last dose on Monday. -Ideally he should be maintained on Plavix for his surgery.  His surgery is scheduled for this morning and he could be restarted on Plavix immediately after or per surgeon he could get a dose this morning if deemed necessary.  Will review with Dr. Rennis Golden. --OK to restart Plavix immediately after surgery. -Patient is stable from a cardiac standpoint.  He is fairly active for his age with no exertional chest discomfort or dyspnea.  No need for any testing prior to his surgery.  CAD -History as above.  The patient is treated with aspirin 81 mg, Plavix 75 mg daily(lifelong), statin, and ACE inhibitor.  Not on beta-blocker likely due to low heart rates.  Hypertension -The patient is on lisinopril 10 mg daily at home.  Since he is n.p.o. his lisinopril is being held and he is on hydralazine as needed IV.  -Blood pressures are mostly controlled -Resume home blood pressure  medication when taking orals.  Acute on chronic renal insufficiency -Baseline creatinine around 1.3.  Serum creatinine was 1.81 on admission, today is 1.35 -Monitor renal function  For questions or updates, please contact [facility] Please consult [email or web address] for contact info under Cardiology/STEMI.   SignedBerton Bon, NP  06/06/2018 7:54 AM'

## 2018-06-06 NOTE — Op Note (Signed)
Laparoscopic Cholecystectomy with IOC Procedure Note  Indications: This patient presents with symptomatic gallbladder disease and will undergo laparoscopic cholecystectomy.  He is on Plavix and cardiology recommends not stopping his Plavix for five days because of his underlying heart disease.  Pre-operative Diagnosis: Calculus of gallbladder with acute cholecystitis, without mention of obstruction  Post-operative Diagnosis: Same  Surgeon: Wynona LunaMatthew K Virga Haltiwanger   Assistants: Wells GuilesKelly Rayburn, PA-C  Anesthesia: General endotracheal anesthesia  ASA Class: 2  Procedure Details  The patient was seen again in the Holding Room. The risks, benefits, complications, treatment options, and expected outcomes were discussed with the patient. The possibilities of reaction to medication, pulmonary aspiration, perforation of viscus, bleeding, recurrent infection, finding a normal gallbladder, the need for additional procedures, failure to diagnose a condition, the possible need to convert to an open procedure, and creating a complication requiring transfusion or operation were discussed with the patient. The likelihood of improving the patient's symptoms with return to their baseline status is good.  The patient and/or family concurred with the proposed plan, giving informed consent. The site of surgery properly noted. The patient was taken to Operating Room, identified as Thomas [name] and the procedure verified as Laparoscopic Cholecystectomy with Intraoperative Cholangiogram. A Time Out was held and the above information confirmed.  Prior to the induction of general anesthesia, antibiotic prophylaxis was administered. General endotracheal anesthesia was then administered and tolerated well. After the induction, the abdomen was prepped with Chloraprep and draped in the sterile fashion. The patient was positioned in the supine position.  Local anesthetic agent was injected into the skin near the umbilicus and an  incision made. We dissected down to the abdominal fascia with blunt dissection.  The fascia was incised vertically and we entered the peritoneal cavity bluntly.  A pursestring suture of 0-Vicryl was placed around the fascial opening.  The Hasson cannula was inserted and secured with the stay suture.  Pneumoperitoneum was then created with CO2 and tolerated well without any adverse changes in the patient's vital signs. An 11-mm port was placed in the subxiphoid position.  Two 5-mm ports were placed in the right upper quadrant. All skin incisions were infiltrated with a local anesthetic agent before making the incision and placing the trocars.   We positioned the patient in reverse Trendelenburg, tilted slightly to the patient's left.  The gallbladder was identified, the fundus grasped and retracted cephalad. There were some adhesions to the gallbladder as well as some edema of the wall of the gallbladder.  Adhesions were lysed bluntly and with the electrocautery where indicated, taking care not to injure any adjacent organs or viscus. The infundibulum was grasped and retracted laterally, exposing the peritoneum overlying the triangle of Calot. This was then divided and exposed in a blunt fashion. A critical view of the cystic duct and cystic artery was obtained.  The cystic duct was clearly identified and bluntly dissected circumferentially. The cystic duct was ligated with a clip distally.   An incision was made in the cystic duct and the [facility] cholangiogram catheter introduced. The catheter was secured using a clip. A cholangiogram was then obtained which showed good visualization of the distal and proximal biliary tree with no sign of filling defects or obstruction.  Contrast flowed easily into the duodenum. The catheter was then removed.   The cystic duct was then ligated with clips and divided. The cystic artery was identified, dissected free, ligated with clips and divided as well.   The gallbladder was  dissected  from the liver bed in retrograde fashion with the electrocautery. The gallbladder was removed and placed in an Endocatch sac. The liver bed was irrigated and inspected. Hemostasis was achieved with the electrocautery and Surgicel SNOW. Copious irrigation was utilized and was repeatedly aspirated until clear.  The gallbladder and Endocatch sac were then removed through the umbilical port site.  The pursestring suture was used to close the umbilical fascia.    We again inspected the right upper quadrant for hemostasis.  Pneumoperitoneum was released as we removed the trocars.  4-0 Monocryl was used to close the skin.   Benzoin, steri-strips, and clean dressings were applied. The patient was then extubated and brought to the recovery room in stable condition. Instrument, sponge, and needle counts were correct at closure and at the conclusion of the case.   Findings: Cholecystitis with Cholelithiasis  Estimated Blood Loss: Minimal         Drains: none         Specimens: Gallbladder           Complications: None; patient tolerated the procedure well.         Disposition: PACU - hemodynamically stable.         Condition: stable  Wilmon Arms. Corliss Skains, MD, [facility]  General/ Trauma Surgery  06/06/2018 11:01 AM

## 2018-06-06 NOTE — Transfer of Care (Signed)
Immediate Anesthesia Transfer of Care Note  Patient: Thomas [name]  Procedure(s) Performed: LAPAROSCOPIC CHOLECYSTECTOMY WITH INTRAOPERATIVE CHOLANGIOGRAM (N/A Abdomen)  Patient Location: PACU  Anesthesia Type:General  Level of Consciousness: awake, alert  and oriented  Airway & Oxygen Therapy: Patient Spontanous Breathing  Post-op Assessment: Report given to RN, Post -op Vital signs reviewed and stable and Patient moving all extremities X 4  Post vital signs: Reviewed and stable  Last Vitals:  Vitals Value Taken Time  BP 157/82 06/06/2018 11:10 AM  Temp 36.4 C 06/06/2018 11:10 AM  Pulse 73 06/06/2018 11:15 AM  Resp 34 06/06/2018 11:15 AM  SpO2 99 % 06/06/2018 11:15 AM  Vitals shown include unvalidated device data.  Last Pain:  Vitals:   06/06/18 0808  TempSrc: Oral  PainSc:       Patients Stated Pain Goal: 1 (06/05/18 0757)  Complications: No apparent anesthesia complications

## 2018-06-06 NOTE — Anesthesia Procedure Notes (Signed)
Procedure Name: Intubation Date/Time: 06/06/2018 9:58 AM Performed by: Leonor Liv, CRNA Pre-anesthesia Checklist: Patient identified, Emergency Drugs available, Suction available and Patient being monitored Patient Re-evaluated:Patient Re-evaluated prior to induction Oxygen Delivery Method: Circle System Utilized Preoxygenation: Pre-oxygenation with 100% oxygen Induction Type: IV induction Ventilation: Mask ventilation without difficulty Laryngoscope Size: Mac and 4 Grade View: Grade I Tube type: Oral Tube size: 7.5 mm Number of attempts: 1 Airway Equipment and Method: Stylet and Oral airway Placement Confirmation: ETT inserted through vocal cords under direct vision,  positive ETCO2 and breath sounds checked- equal and bilateral Secured at: 23 cm Tube secured with: Tape Dental Injury: Teeth and Oropharynx as per pre-operative assessment and Injury to lip  Comments: Sharp front teeth- left upper lip pinch injury during mask ventilation

## 2018-06-07 ENCOUNTER — Encounter (HOSPITAL_COMMUNITY): Payer: Self-pay | Admitting: Surgery

## 2018-06-07 LAB — COMPREHENSIVE METABOLIC PANEL
ALBUMIN: 3 g/dL — AB (ref 3.5–5.0)
ALK PHOS: 47 U/L (ref 38–126)
ALT: 54 U/L — AB (ref 0–44)
ANION GAP: 8 (ref 5–15)
AST: 47 U/L — ABNORMAL HIGH (ref 15–41)
BUN: 13 mg/dL (ref 8–23)
CALCIUM: 8.2 mg/dL — AB (ref 8.9–10.3)
CO2: 25 mmol/L (ref 22–32)
CREATININE: 1.49 mg/dL — AB (ref 0.61–1.24)
Chloride: 108 mmol/L (ref 98–111)
GFR calc Af Amer: 51 mL/min — ABNORMAL LOW (ref >60)
GFR calc non Af Amer: 44 mL/min — ABNORMAL LOW (ref >60)
Glucose, Bld: 118 mg/dL — ABNORMAL HIGH (ref 70–99)
Potassium: 3.4 mmol/L — ABNORMAL LOW (ref 3.5–5.1)
SODIUM: 141 mmol/L (ref 135–145)
Total Bilirubin: 1 mg/dL (ref 0.3–1.2)
Total Protein: 5.2 g/dL — ABNORMAL LOW (ref 6.5–8.1)

## 2018-06-07 LAB — CBC
HCT: 39 % (ref 39.0–52.0)
HEMOGLOBIN: 12.6 g/dL — AB (ref 13.0–17.0)
MCH: 33.4 pg (ref 26.0–34.0)
MCHC: 32.3 g/dL (ref 30.0–36.0)
MCV: 103.4 fL — ABNORMAL HIGH (ref 78.0–100.0)
Platelets: 165 10*3/uL (ref 150–400)
RBC: 3.77 MIL/uL — AB (ref 4.22–5.81)
RDW: 13.2 % (ref 11.5–15.5)
WBC: 9.8 10*3/uL (ref 4.0–10.5)

## 2018-06-07 MED ORDER — POTASSIUM CHLORIDE CRYS ER 20 MEQ PO TBCR
20.0000 meq | EXTENDED_RELEASE_TABLET | Freq: Once | ORAL | Status: AC
  Administered 2018-06-07: 20 meq via ORAL
  Filled 2018-06-07: qty 1

## 2018-06-07 MED ORDER — FAMOTIDINE 20 MG PO TABS
20.0000 mg | ORAL_TABLET | Freq: Two times a day (BID) | ORAL | Status: DC
  Administered 2018-06-07 – 2018-06-08 (×2): 20 mg via ORAL
  Filled 2018-06-07 (×2): qty 1

## 2018-06-07 NOTE — Progress Notes (Signed)
1 Day Post-Op   Subjective/Chief Complaint: Patient feeling well -hungry Not requiring much pain medication No nausea or vomiting   Objective: Vital signs in last 24 hours: Temp:  [97.6 F (36.4 C)-98.8 F (37.1 C)] 98.2 F (36.8 C) (06/27 0550) Pulse Rate:  [65-73] 71 (06/27 0904) Resp:  [14-25] 14 (06/27 0904) BP: (100-159)/(60-82) 100/68 (06/27 0904) SpO2:  [93 %-97 %] 97 % (06/27 0550) Weight:  [96.5 kg (212 lb 11.2 oz)] 96.5 kg (212 lb 11.2 oz) (06/27 0550) Last BM Date: 06/03/18  Intake/Output from previous day: 06/26 0701 - 06/27 0700 In: 2869.7 [P.O.:720; I.V.:2099.7] Out: 1335 [Urine:1275; Blood:10] Intake/Output this shift: Total I/O In: 534.3 [I.V.:534.3] Out: -   General appearance: alert, cooperative and no distress GI: soft, minimal distention,incisional tenderness mostly at epigastric site Incisions c/d/i  Lab Results:  Recent Labs    06/06/18 0632 06/07/18 0621  WBC 4.4 9.8  HGB 13.2 12.6*  HCT 41.0 39.0  PLT 181 165   BMET Recent Labs    06/06/18 0632 06/07/18 0621  NA 141 141  K 3.9 3.4*  CL 108 108  CO2 26 25  GLUCOSE 90 118*  BUN 13 13  CREATININE 1.35* 1.49*  CALCIUM 8.5* 8.2*   PT/INR Recent Labs    06/04/18 1745  LABPROT 14.3  INR 1.11   ABG No results for input(s): PHART, HCO3 in the last 72 hours.  Invalid input(s): PCO2, PO2  Studies/Results: Dg Cholangiogram Operative  Result Date: 06/06/2018 CLINICAL DATA:  Cholelithiasis EXAM: INTRAOPERATIVE CHOLANGIOGRAM TECHNIQUE: Cholangiographic images from the C-arm fluoroscopic device were submitted for interpretation post-operatively. Please see the procedural report for the amount of contrast and the fluoroscopy time utilized. COMPARISON:  06/05/2018 FINDINGS: Intraoperative cholangiogram performed during the laparoscopic procedure. The biliary confluence, common hepatic duct, and common bile duct are patent. Contrast drains freely into the duodenum. No dilatation,  obstruction, stricture or significant filling defect. Gallstones noted correlating with the MRI. IMPRESSION: Cholelithiasis. Patent biliary system. Electronically Signed   By: Judie PetitM.  Shick M.D.   On: 06/06/2018 11:44    Anti-infectives: Anti-infectives (From admission, onward)   Start     Dose/Rate Route Frequency Ordered Stop   06/06/18 1230  piperacillin-tazobactam (ZOSYN) IVPB 3.375 g     3.375 g 12.5 mL/hr over 240 Minutes Intravenous Every 8 hours 06/06/18 1200 06/06/18 1736   06/05/18 2000  piperacillin-tazobactam (ZOSYN) IVPB 3.375 g  Status:  Discontinued     3.375 g 12.5 mL/hr over 240 Minutes Intravenous Every 8 hours 06/05/18 1849 06/06/18 1200      Assessment/Plan: Acute cholecystitis CAD - on Plavix  Doing well.  No signs of bleeding Advance diet as tolerated Should be ready for discharge tomorrow.    LOS: 3 days    Wynona LunaMatthew K Lillyth Spong 06/07/2018

## 2018-06-07 NOTE — Progress Notes (Signed)
PROGRESS NOTE  Thomas CoveyClifford H Raulerson  EAV:[id number] DOB: [birth year] DOA: 06/04/2018 PCP: Kaleen MaskElkins, Wilson Oliver, MD   Brief Narrative: Thomas [name] is a 77 y.o. male with a history of CAD, HTN, hypothyroidism who presented with abdominal pain radiating to the back with vomiting found to have gallstones on CT without pancreatic inflammation and acute cholecystitis with AKI. GI was consulted, recommending MRCP which showed GB wall thickening without CBD obstruction or dilatation. Surgery was consulted and performed laparoscopic cholecystectomy 06/06/2018. Plavix was momentarily held per cardiology recommendations.   Assessment & Plan: Principal Problem:   Acute pancreatitis Active Problems:   CAD (coronary artery disease)   HTN (hypertension)   Abdominal pain   ARF (acute renal failure) (HCC)   Gallstones   Preoperative cardiovascular examination  Acute cholecystitis with cholelithiasis: s/p cholecystectomy 6/26.  - Ok to stop IVF's, DC zosyn, as po intake is reliable. Full liquids per surgery, possible DC tmrw.   CAD s/p PCI to LAD 2008: He's had thrombus on the stent when off plavix, so he has been on indefinite plavix:  - Restart plavix in AM per surgery/cardiology discussions.  - Not on BB due to bradycardia.   AKI: SCr baseline ~1.3, up to 1.8 on admission. Resolved back to baseline.  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    DVT prophylaxis: SCDs Code Status: Full Family Communication: Wife at bedside Disposition Plan: Per general surgery. DC home likely 6/28.   Consultants:   General surgery  GI  Cardiology  Procedures:   Laparoscopic cholecystectomy 06/06/2018  Antimicrobials:  Zosyn   Subjective: No complaints, using little pain medications. Walked, wants to eat fried chicken. No bleeding  Objective: BP 100/68  (BP Location: Right Arm)   Pulse 71   Temp 98.2 F (36.8 C) (Oral)   Resp 14   Ht 6' 0.01" (1.829 m)   Wt 96.5 kg (212 lb 11.2 oz) Comment: scale a  SpO2 97%   BMI 28.84 kg/m   Gen: 77 y.o. male in no distress Pulm: Nonlabored breathing room air. Clear. CV: Regular rate and rhythm. No murmur, rub, or gallop. No JVD, no dependent edema. GI: Abdomen soft, appropriately mildly tender, non-distended, with normoactive bowel sounds.  Ext: Warm, no deformities Skin: Incision sites with c/d/i dressings Neuro: Alert and oriented. No focal neurological deficits. Psych: Judgement and insight appear fair. Mood euthymic & affect congruent. Behavior is appropriate.    CBC: Recent Labs  Lab 06/04/18 1745 06/04/18 2357 06/06/18 0632 06/07/18 0621  WBC 11.3* 5.8 4.4 9.8  NEUTROABS 8.9*  --   --   --   HGB 14.5 13.2 13.2 12.6*  HCT 46.4 41.0 41.0 39.0  MCV 106.7* 104.9* 105.9* 103.4*  PLT 247 205 181 165   Basic Metabolic Panel: Recent Labs  Lab 06/04/18 1745 06/04/18 2357 06/06/18 0632 06/07/18 0621  NA 144 144 141 141  K 3.6 4.3 3.9 3.4*  CL 106 110 108 108  CO2 25 26 26 25   GLUCOSE 116* 107* 90 118*  BUN 17 16 13 13   CREATININE 1.81* 1.65* 1.35* 1.49*  CALCIUM 9.5 8.8* 8.5* 8.2*   GFR: Estimated Creatinine Clearance: 50.8 mL/min (A) (by C-G formula based on SCr of 1.49 mg/dL (H)). Liver Function Tests: Recent Labs  Lab 06/04/18 1745 06/04/18 2357 06/06/18 [id number] 06/07/18 0621  AST 38  62* 28 47*  ALT 27 55* 41 54*  ALKPHOS 61 58 55 47  BILITOT 1.6* 0.9 1.4* 1.0  PROT 6.7 5.8* 5.4* 5.2*  ALBUMIN 4.1 3.5 3.1* 3.0*   Recent Labs  Lab 06/04/18 1745 06/06/18 0632  LIPASE 250* 36   No results for input(s): AMMONIA in the last 168 hours. Coagulation Profile: Recent Labs  Lab 06/04/18 1745  INR 1.11   Cardiac Enzymes: Recent Labs  Lab 06/04/18 2357  TROPONINI <0.03   BNP (last 3 results) No results for input(s): PROBNP in the last 8760 hours. HbA1C: No  results for input(s): HGBA1C in the last 72 hours. CBG: Recent Labs  Lab 06/05/18 1641 06/05/18 2056 06/06/18 0747 06/06/18 1212 06/06/18 1535  GLUCAP 77 80 83 90 134*   Lipid Profile: Recent Labs    06/04/18 2357  TRIG 61   Thyroid Function Tests: No results for input(s): TSH, T4TOTAL, FREET4, T3FREE, THYROIDAB in the last 72 hours. Anemia Panel: No results for input(s): VITAMINB12, FOLATE, FERRITIN, TIBC, IRON, RETICCTPCT in the last 72 hours. Urine analysis:    Component Value Date/Time   COLORURINE YELLOW 04/27/2015 1033   APPEARANCEUR CLEAR 04/27/2015 1033   LABSPEC 1.018 04/27/2015 1033   PHURINE 8.0 04/27/2015 1033   GLUCOSEU NEGATIVE 04/27/2015 1033   HGBUR NEGATIVE 04/27/2015 1033   BILIRUBINUR NEGATIVE 04/27/2015 1033   KETONESUR NEGATIVE 04/27/2015 1033   PROTEINUR NEGATIVE 04/27/2015 1033   UROBILINOGEN 0.2 04/27/2015 1033   NITRITE NEGATIVE 04/27/2015 1033   LEUKOCYTESUR NEGATIVE 04/27/2015 1033   No results found for this or any previous visit (from the past 240 hour(s)).    Radiology Studies: Dg Cholangiogram Operative  Result Date: 06/06/2018 CLINICAL DATA:  Cholelithiasis EXAM: INTRAOPERATIVE CHOLANGIOGRAM TECHNIQUE: Cholangiographic images from the C-arm fluoroscopic device were submitted for interpretation post-operatively. Please see the procedural report for the amount of contrast and the fluoroscopy time utilized. COMPARISON:  06/05/2018 FINDINGS: Intraoperative cholangiogram performed during the laparoscopic procedure. The biliary confluence, common hepatic duct, and common bile duct are patent. Contrast drains freely into the duodenum. No dilatation, obstruction, stricture or significant filling defect. Gallstones noted correlating with the MRI. IMPRESSION: Cholelithiasis. Patent biliary system. Electronically Signed   By: Judie Petit.  Shick M.D.   On: 06/06/2018 11:44    Scheduled Meds: . aspirin  81 mg Oral Daily  . clopidogrel  75 mg Oral Q breakfast    . famotidine  20 mg Oral BID  . levothyroxine  150 mcg Oral QAC breakfast  . simvastatin  40 mg Oral q1800   Continuous Infusions: . dextrose 5 % and 0.45% NaCl 75 mL/hr at 06/07/18 0858  . lactated ringers 10 mL/hr at 06/06/18 0922     LOS: 3 days   Time spent: 25 minutes.  Tyrone Nine, MD [facility] [email or web address] Password [facility] 06/07/2018, 12:35 PM

## 2018-06-07 NOTE — Plan of Care (Signed)
  Problem: Coping: Goal: Level of anxiety will decrease Outcome: Completed/Met   Problem: Elimination: Goal: Will not experience complications related to bowel motility Outcome: Completed/Met Goal: Will not experience complications related to urinary retention Outcome: Completed/Met

## 2018-06-07 NOTE — Care Management Important Message (Signed)
Important Message  Patient Details  Name: Thomas [name] MRN: [id number] Date of Birth: 05-Mar-1941   Medicare Important Message Given:  Yes    [name] [name] 06/07/2018, 2:28 PM

## 2018-06-08 LAB — BASIC METABOLIC PANEL
Anion gap: 6 (ref 5–15)
BUN: 13 mg/dL (ref 8–23)
CALCIUM: 8.2 mg/dL — AB (ref 8.9–10.3)
CO2: 26 mmol/L (ref 22–32)
CREATININE: 1.33 mg/dL — AB (ref 0.61–1.24)
Chloride: 109 mmol/L (ref 98–111)
GFR calc Af Amer: 58 mL/min — ABNORMAL LOW (ref >60)
GFR, EST NON AFRICAN AMERICAN: 50 mL/min — AB (ref >60)
Glucose, Bld: 99 mg/dL (ref 70–99)
Potassium: 3.5 mmol/L (ref 3.5–5.1)
SODIUM: 141 mmol/L (ref 135–145)

## 2018-06-08 LAB — GLUCOSE, CAPILLARY
GLUCOSE-CAPILLARY: 95 mg/dL (ref 70–99)
Glucose-Capillary: 100 mg/dL — ABNORMAL HIGH (ref 70–99)

## 2018-06-08 NOTE — Progress Notes (Signed)
[facility] Progress Note  2 Days Post-Op  Subjective: CC:  Denies pain, mild soreness around incisions. Tolerating PO. Having soft/loose Brown BMs (x4). Denies N/V. Mobilizing in hallways.  Objective: Vital signs in last 24 hours: Temp:  [98.3 F (36.8 C)] 98.3 F (36.8 C) (06/28 0538) Pulse Rate:  [72-74] 74 (06/28 0538) Resp:  [18] 18 (06/28 0538) BP: (120-133)/(50-72) 133/72 (06/28 0538) SpO2:  [96 %-98 %] 96 % (06/28 0538) Weight:  [95.7 kg (210 lb 14.4 oz)] 95.7 kg (210 lb 14.4 oz) (06/28 0538) Last BM Date: 06/07/18  Intake/Output from previous day: 06/27 0701 - 06/28 0700 In: 774.3 [P.O.:240; I.V.:534.3] Out: 850 [Urine:850] Intake/Output this shift: No intake/output data recorded.  PE: Gen:  Alert, NAD, pleasant Pulm:  Normal effort Abd: Soft, appropriately tender around incisions, incisions C/D/I Skin: warm and dry, no rashes  Psych: A&Ox3   Lab Results:  Recent Labs    06/06/18 0632 06/07/18 0621  WBC 4.4 9.8  HGB 13.2 12.6*  HCT 41.0 39.0  PLT 181 165   BMET Recent Labs    06/07/18 0621 06/08/18 0727  NA 141 141  K 3.4* 3.5  CL 108 109  CO2 25 26  GLUCOSE 118* 99  BUN 13 13  CREATININE 1.49* 1.33*  CALCIUM 8.2* 8.2*   PT/INR No results for input(s): LABPROT, INR in the last 72 hours. CMP     Component Value Date/Time   NA 141 06/08/2018 0727   NA 140 04/02/2018 0919   K 3.5 06/08/2018 0727   CL 109 06/08/2018 0727   CO2 26 06/08/2018 0727   GLUCOSE 99 06/08/2018 0727   BUN 13 06/08/2018 0727   BUN 17 04/02/2018 0919   CREATININE 1.33 (H) 06/08/2018 0727   CREATININE 1.39 (H) 02/29/2016 0839   CALCIUM 8.2 (L) 06/08/2018 0727   PROT 5.2 (L) 06/07/2018 0621   PROT 6.2 04/02/2018 0919   ALBUMIN 3.0 (L) 06/07/2018 0621   ALBUMIN 4.2 04/02/2018 0919   AST 47 (H) 06/07/2018 0621   ALT 54 (H) 06/07/2018 0621   ALKPHOS 47 06/07/2018 0621   BILITOT 1.0 06/07/2018 0621   BILITOT 0.7 04/02/2018 0919   GFRNONAA 50 (L)  06/08/2018 0727   GFRAA 58 (L) 06/08/2018 0727   Lipase     Component Value Date/Time   LIPASE 36 06/06/2018 [id number]       Studies/Results: Dg Cholangiogram Operative  Result Date: 06/06/2018 CLINICAL DATA:  Cholelithiasis EXAM: INTRAOPERATIVE CHOLANGIOGRAM TECHNIQUE: Cholangiographic images from the C-arm fluoroscopic device were submitted for interpretation post-operatively. Please see the procedural report for the amount of contrast and the fluoroscopy time utilized. COMPARISON:  06/05/2018 FINDINGS: Intraoperative cholangiogram performed during the laparoscopic procedure. The biliary confluence, common hepatic duct, and common bile duct are patent. Contrast drains freely into the duodenum. No dilatation, obstruction, stricture or significant filling defect. Gallstones noted correlating with the MRI. IMPRESSION: Cholelithiasis. Patent biliary system. Electronically Signed   By: Judie PetitM.  Shick M.D.   On: 06/06/2018 11:44    Anti-infectives: Anti-infectives (From admission, onward)   Start     Dose/Rate Route Frequency Ordered Stop   06/06/18 1230  piperacillin-tazobactam (ZOSYN) IVPB 3.375 g     3.375 g 12.5 mL/hr over 240 Minutes Intravenous Every 8 hours 06/06/18 1200 06/06/18 1736   06/05/18 2000  piperacillin-tazobactam (ZOSYN) IVPB 3.375 g  Status:  Discontinued     3.375 g 12.5 mL/hr over 240 Minutes Intravenous Every 8 hours 06/05/18 1849 06/06/18 1200  Assessment/Plan CAD - on Plavix Acute calulous cholecystitis POD#2 s/p lap chole IOC Dr. Corliss Skains 6/26 - afebrile, VSS, pain controlled  - tolerating HH diet - stable for discharge from surgical perspective, follow-up provided.    LOS: 4 days    Adam Phenix , [facility] 06/08/2018, 9:25 AM Pager: [phone] Consults: [phone] Mon-Fri 7:00 am-4:30 pm Sat-Sun 7:00 am-11:30 am

## 2018-06-08 NOTE — Discharge Instructions (Signed)
Please arrive at least 30 min before your appointment to complete your check in paperwork.  If you are unable to arrive 30 min prior to your appointment time we may have to cancel or reschedule you.  LAPAROSCOPIC SURGERY: POST OP INSTRUCTIONS  1. DIET: Follow a light bland diet the first 24 hours after arrival home, such as soup, liquids, crackers, etc. Be sure to include lots of fluids daily. Avoid fast food or heavy meals as your are more likely to get nauseated. Eat a low fat the next few days after surgery.  2. Take your usually prescribed home medications unless otherwise directed. 3. PAIN CONTROL:  1. Pain is best controlled by a usual combination of three different methods TOGETHER:  1. Ice/Heat 2. Over the counter pain medication 3. Prescription pain medication 2. Most patients will experience some swelling and bruising around the incisions. Ice packs or heating pads (30-60 minutes up to 6 times a day) will help. Use ice for the first few days to help decrease swelling and bruising, then switch to heat to help relax tight/sore spots and speed recovery. Some people prefer to use ice alone, heat alone, alternating between ice & heat. Experiment to what works for you. Swelling and bruising can take several weeks to resolve.  3. It is helpful to take an over-the-counter pain medication regularly for the first few weeks.  1. Acetaminophen (Tylenol, etc) 500-650mg  four times a day (every meal & bedtime) 4. Avoid getting constipated. Between the surgery and the pain medications, it is common to experience some constipation. Increasing fluid intake and taking a fiber supplement (such as Metamucil, Citrucel, FiberCon, MiraLax, etc) 1-2 times a day regularly will usually help prevent this problem from occurring. A mild laxative (prune juice, Milk of Magnesia, MiraLax, etc) should be taken according to package directions if there are no bowel movements after 48 hours.  5. Watch out for diarrhea. If you  have many loose bowel movements, simplify your diet to bland foods & liquids for a few days. Stop any stool softeners and decrease your fiber supplement. Switching to mild anti-diarrheal medications (Kayopectate, Pepto Bismol) can help. If this worsens or does not improve, please call us. 6. Wash / shower every day. You may shower over the dressings as they are waterproof. Continue to shower over incision(s) after the dressing is off. 7. Remove your waterproof bandages 5 days after surgery. You may leave the incision open to air. You may replace a dressing/Band-Aid to cover the incision for comfort if you wish.  8. ACTIVITIES as tolerated:  1. You may resume regular (light) daily activities beginning the next day--such as daily self-care, walking, climbing stairs--gradually increasing activities as tolerated. If you can walk 30 minutes without difficulty, it is safe to try more intense activity such as jogging, treadmill, bicycling, low-impact aerobics, swimming, etc. 2. Save the most intensive and strenuous activity for last such as sit-ups, heavy lifting, contact sports, etc Refrain from any heavy lifting or straining until you are off narcotics for pain control.  3. DO NOT PUSH THROUGH PAIN. Let pain be your guide: If it hurts to do something, don't do it. Pain is your body warning you to avoid that activity for another week until the pain goes down. 4. You may drive when you are no longer taking prescription pain medication, you can comfortably wear a seatbelt, and you can safely maneuver your car and apply brakes. 5. You may have sexual intercourse when it is comfortable.  9.  FOLLOW UP in our office  1. Please call [facility] at [phone] to set up an appointment to see your surgeon in the office for a follow-up appointment approximately 2-3 weeks after your surgery. 2. Make sure that you call for this appointment the day you arrive home to insure a convenient appointment time.      10. IF YOU HAVE  DISABILITY OR FAMILY LEAVE FORMS, BRING THEM TO THE               OFFICE FOR PROCESSING.   WHEN TO CALL us [phone]:  1. Poor pain control 2. Reactions / problems with new medications (rash/itching, nausea, etc)  3. Fever over 101.5 F (38.5 C) 4. Inability to urinate 5. Nausea and/or vomiting 6. Worsening swelling or bruising 7. Continued bleeding from incision. 8. Increased pain, redness, or drainage from the incision  The clinic staff is available to answer your questions during regular business hours (8:30am-5pm). Please dont hesitate to call and ask to speak to one of our nurses for clinical concerns.  If you have a medical emergency, go to the nearest emergency room or call 911.  A surgeon from [facility] is always on call at the [facility], [place]  [address], [address], [place], [place] [address] ?  MAIN: [phone] ? TOLL FREE: [phone] ?  FAX [phone]  [email or web address]

## 2018-06-08 NOTE — Discharge Summary (Signed)
Physician Discharge Summary  Thomas [name] ZOX:[id number] DOB: Feb 20, 1941 DOA: 06/04/2018  PCP: Kaleen Mask, MD  Admit date: 06/04/2018 Discharge date: 06/08/2018  Admitted From: Home Disposition: Home   Recommendations for Outpatient Follow-up:  1. Follow up with PCP in 1-2 weeks 2. Follow up with surgery as scheduled 3. Consider repeat CMP  Home Health: None Equipment/Devices: None Discharge Condition: Stable CODE STATUS: Full Diet recommendation: Heart healthy/soft as tolerated  Brief/Interim Summary: Thomas [name] is a 77 y.o. male with a history of CAD, HTN, hypothyroidism who presented with abdominal pain radiating to the back with vomiting found to have gallstones on CT without pancreatic inflammation and acute cholecystitis with AKI. GI was consulted, recommending MRCP which showed GB wall thickening without CBD obstruction or dilatation. Surgery was consulted and performed laparoscopic cholecystectomy 06/06/2018. Plavix was momentarily held per cardiology recommendations and restarted with no intervening anginal symptoms and no subsequent bleeding. Postoperative course has been uncomplicated and he will be discharged in stable condition.   Discharge Diagnoses:  Principal Problem:   Acute pancreatitis Active Problems:   CAD (coronary artery disease)   HTN (hypertension)   Abdominal pain   ARF (acute renal failure) (HCC)   Gallstones   Preoperative cardiovascular examination   Acute cholecystitis with cholelithiasis: s/p cholecystectomy 6/26.  - Tolerating diet, not taking narcotic pain medications, having stools and ambulating. Cleared for DC per surgery.   CAD s/p PCI to LAD 2008: He's had thrombus on the stent when off plavix, so he has been on indefinite plavix:  - Restarted plavix in AM postop per surgery/cardiology discussions.  - Not on BB due to bradycardia.   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 Resolved back to  baseline.  - CKD Dx based on CrCl of 56.7 at his presumed baseline.   Hypokalemia: Due to NPO, now resolved.  Hypothyroidism:  - Continue stable-dose synthroid.    Discharge Instructions Discharge Instructions    Diet - low sodium heart healthy   Complete by:  As directed    Discharge instructions   Complete by:  As directed    You are stable for discharge after your laparoscopic cholecystectomy with the following recommendations:  - Do not lift any heavy objects - Follow up with general surgery as scheduled - If you notice bleeding, worsening abdominal pain, shortness of breath or chest pain or fever you need to seek medical attention right away.   Increase activity slowly   Complete by:  As directed      Allergies as of 06/08/2018      Reactions   Oxycodone Nausea Only   Robaxin [methocarbamol] Nausea Only   Tramadol Nausea Only, Other (See Comments)   Hallucinations      Medication List    TAKE these medications   aspirin EC 81 MG tablet Take 1 tablet (81 mg total) by mouth daily.   clopidogrel 75 MG tablet Commonly known as:  PLAVIX Take 75 mg by mouth daily with breakfast.   levothyroxine 150 MCG tablet Commonly known as:  SYNTHROID, LEVOTHROID Take 150 mcg by mouth daily.   lisinopril 20 MG tablet Commonly known as:  PRINIVIL,ZESTRIL Take 10 mg by mouth daily.   nitroGLYCERIN 0.4 MG SL tablet Commonly known as:  NITROSTAT Place 1 tablet (0.4 mg total) under the tongue every 5 (five) minutes as needed for chest pain.   ranitidine 150 MG tablet Commonly known as:  ZANTAC Take 150 mg by mouth  2 (two) times daily as needed for heartburn.   simvastatin 40 MG tablet Commonly known as:  ZOCOR Take 40 mg by mouth every evening.      Follow-up Information    Kaleen MaskElkins, Wilson Oliver, MD.   [facility]  [facility] Contact information: [address] [place] [address] [phone]        Surgery, [facility]. Go on 06/21/2018.    Specialty:  General Surgery Why:  Follow up appointment scheduled for 1:45 PM. Please arrive 30 min prior to appointment time for check in. Bring photo ID and insurance information.  Contact information: [address] [place] [address] [phone]          Allergies  Allergen Reactions  . Oxycodone Nausea Only  . Robaxin [Methocarbamol] Nausea Only  . Tramadol Nausea Only and Other (See Comments)    Hallucinations    Consultations:  Surgery  Procedures/Studies: Dg Cholangiogram Operative  Result Date: 06/06/2018 CLINICAL DATA:  Cholelithiasis EXAM: INTRAOPERATIVE CHOLANGIOGRAM TECHNIQUE: Cholangiographic images from the C-arm fluoroscopic device were submitted for interpretation post-operatively. Please see the procedural report for the amount of contrast and the fluoroscopy time utilized. COMPARISON:  06/05/2018 FINDINGS: Intraoperative cholangiogram performed during the laparoscopic procedure. The biliary confluence, common hepatic duct, and common bile duct are patent. Contrast drains freely into the duodenum. No dilatation, obstruction, stricture or significant filling defect. Gallstones noted correlating with the MRI. IMPRESSION: Cholelithiasis. Patent biliary system. Electronically Signed   By: Judie PetitM.  Shick M.D.   On: 06/06/2018 11:44   Ct Abdomen Pelvis W Contrast  Result Date: 06/04/2018 CLINICAL DATA:  77 y/o M; epigastric abdominal pain with nausea vomiting. EXAM: CT ABDOMEN AND PELVIS WITH CONTRAST TECHNIQUE: Multidetector CT imaging of the abdomen and pelvis was performed using the standard protocol following bolus administration of intravenous contrast. CONTRAST:  80mL OMNIPAQUE IOHEXOL 300 MG/ML  SOLN COMPARISON:  None. FINDINGS: Lower chest: Moderate coronary artery calcific atherosclerosis. 3-4 mm right middle lobe calcified granuloma. Hepatobiliary: No focal liver abnormality is seen. Cholelithiasis. No gallbladder wall thickening or biliary ductal  dilatation. Pancreas: Unremarkable. No pancreatic ductal dilatation or surrounding inflammatory changes. Spleen: Normal in size without focal abnormality. Adrenals/Urinary Tract: 13 mm right kidney interpolar cyst and subcentimeter left kidney upper pole cyst. Multiple nonobstructing right-sided kidney stones measuring up to 5 mm. No hydronephrosis. Normal bladder. Stomach/Bowel: Stomach is within normal limits. Appendix appears normal. No evidence of bowel wall thickening, distention, or inflammatory changes. Sigmoid diverticulosis without findings of acute diverticulitis. 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 Calcific aortic atherosclerosis. Reproductive: Obscured by streak artifact from hip hardware. Other: No abdominal wall hernia or abnormality. No abdominopelvic ascites. Musculoskeletal: Bilateral hip arthroplasty. Advanced multilevel disc and facet degenerative changes of the spine. 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 IMPRESSION: 1. No acute process identified. 2. Coronary and aortic calcific atherosclerosis. 3. Cholelithiasis. 4. Multiple nonobstructing right kidney stones measuring up 5 mm. 5. 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 6. 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 Recommend followup by ultrasound in 5 years. This recommendation follows ACR consensus guidelines: White Paper of the ACR Incidental Findings Committee II on Vascular Findings. J Am Coll Radiol 2013; 10:789-794. Electronically Signed   By: Mitzi HansenLance  Furusawa-Stratton M.D.   On: 06/04/2018 20:38   Mr Abdomen Mrcp Wo Contrast  Result Date: 06/05/2018 CLINICAL DATA:  Epigastric pain and burning.  Cholelithiasis. EXAM: MRI  ABDOMEN WITHOUT CONTRAST  (INCLUDING MRCP) TECHNIQUE: Multiplanar multisequence MR imaging of the abdomen was performed. Heavily T2-weighted images of the biliary and pancreatic ducts were  obtained, and three-dimensional MRCP images were rendered by post processing. COMPARISON:  CT scan dated 06/04/2018 FINDINGS: Lower chest: Small type 1 hiatal hernia. Hepatobiliary: For gallstones measuring up to 1.8 cm in long axis are observed in the gallbladder with mild gallbladder wall thickening. No intrahepatic or extrahepatic biliary dilatation. No directly visualized choledocholithiasis. No specific hepatic parenchymal lesions. Pancreas: Unremarkable noncontrast appearance of the pancreas. No dorsal pancreatic duct dilatation. Spleen:  Unremarkable Adrenals/Urinary Tract: 9 mm fluid signal intensity lesion of the right mid kidney laterally. 5 mm fluid signal intensity lesion of the left kidney upper pole. No hydronephrosis. The patient has known right nephrolithiasis more apparent on the CT scan. Stomach/Bowel: Nondistended stomach.  Otherwise unremarkable. Vascular/Lymphatic: Aortoiliac atherosclerotic vascular disease. Small porta hepatis lymph nodes on image 15/9 are not pathologically enlarged by size criteria. Other:  No supplemental non-categorized findings. Musculoskeletal: Thoracolumbar spondylosis and degenerative disc disease. The patient has known pars defects at L5. IMPRESSION: 1. Cholelithiasis with mild gallbladder wall thickening. Correlate clinically in assessing for acute cholecystitis. If further imaging workup for cholecystitis is indicated, nuclear medicine hepatobiliary scan could be utilized to assess for cystic duct patency. 2. Small type 1 hiatal hernia. 3. Known right nephrolithiasis, better shown on CT. 4.  Aortic Atherosclerosis (ICD10-I70.0). 5. 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 Electronically Signed   By: Gaylyn Rong M.D.   On: 06/05/2018 07:11   Dg Abd Acute W/chest  Result Date: 06/04/2018 CLINICAL DATA:  77 y/o M; she central chest pain and epigastric pain. Radiates to the back. Vomiting. EXAM: DG ABDOMEN ACUTE  W/ 1V CHEST COMPARISON:  07/30/2015 chest radiograph FINDINGS: There is no evidence of dilated bowel loops or free intraperitoneal air. No radiopaque calculi or other significant radiographic abnormality is seen. Heart size and mediastinal contours are within normal limits. Both lungs are clear. Partially visualized hip prostheses. Degenerative changes of the spine with mild thoracolumbar dextrocurvature. IMPRESSION: Negative abdominal radiographs.  No acute cardiopulmonary disease. Electronically Signed   By: Mitzi Hansen M.D.   On: 06/04/2018 18:53    Consultants:   General surgery  GI  Cardiology  Procedures:   Laparoscopic cholecystectomy 06/06/2018  Subjective: Pain well controlled without opioids. Ambulating, had loose brown not watery stools overnight. Eating very well without N/V/abd pain worsening. No fever. Denies any new bleeding, dyspnea or CP.   Discharge Exam: Vitals:   06/07/18 1954 06/08/18 0538  BP: (!) 120/50 133/72  Pulse: 72 74  Resp: 18 18  Temp: 98.3 F (36.8 C) 98.3 F (36.8 C)  SpO2: 98% 96%   General: Pt is alert, awake, not in acute distress Cardiovascular: RRR, S1/S2 +, no rubs, no gallops Respiratory: CTA bilaterally, no wheezing, no rhonchi Abdominal: Soft, mildly tender in RUQ without distention, rebound or guarding. Lap sites c/d/i.  Extremities: No edema, no cyanosis  Labs: BNP (last 3 results) No results for input(s): BNP in the last 8760 hours. Basic Metabolic Panel: Recent Labs  Lab 06/04/18 1745 06/04/18 2357 06/06/18 0632 06/07/18 0621 06/08/18 0727  NA 144 144 141 141 141  K 3.6 4.3 3.9 3.4* 3.5  CL 106 110 108 108 109  CO2 25 26 26 25 26   GLUCOSE 116* 107* 90 118* 99  BUN 17 16 13 13 13   CREATININE 1.81* 1.65* 1.35* 1.49* 1.33*  CALCIUM 9.5 8.8* 8.5* 8.2* 8.2*   Liver Function Tests: Recent Labs  Lab 06/04/18 1745 06/04/18 2357 06/06/18 0632 06/07/18 0621  AST 38 62* 28 47*  ALT 27 55* 41 54*  ALKPHOS  61 58 55 47  BILITOT 1.6* 0.9 1.4* 1.0  PROT 6.7 5.8* 5.4* 5.2*  ALBUMIN 4.1 3.5 3.1* 3.0*   Recent Labs  Lab 06/04/18 1745 06/06/18 0632  LIPASE 250* 36   No results for input(s): AMMONIA in the last 168 hours. CBC: Recent Labs  Lab 06/04/18 1745 06/04/18 2357 06/06/18 0632 06/07/18 0621  WBC 11.3* 5.8 4.4 9.8  NEUTROABS 8.9*  --   --   --   HGB 14.5 13.2 13.2 12.6*  HCT 46.4 41.0 41.0 39.0  MCV 106.7* 104.9* 105.9* 103.4*  PLT 247 205 181 165   Cardiac Enzymes: Recent Labs  Lab 06/04/18 2357  TROPONINI <0.03   BNP: Invalid input(s): POCBNP CBG: Recent Labs  Lab 06/06/18 0747 06/06/18 1212 06/06/18 1535 06/08/18 0010 06/08/18 0559  GLUCAP 83 90 134* 95 100*   D-Dimer No results for input(s): DDIMER in the last 72 hours. Hgb A1c No results for input(s): HGBA1C in the last 72 hours. Lipid Profile No results for input(s): CHOL, HDL, LDLCALC, TRIG, CHOLHDL, LDLDIRECT in the last 72 hours. Thyroid function studies No results for input(s): TSH, T4TOTAL, T3FREE, THYROIDAB in the last 72 hours.  Invalid input(s): FREET3 Anemia work up No results for input(s): VITAMINB12, FOLATE, FERRITIN, TIBC, IRON, RETICCTPCT in the last 72 hours. Urinalysis    Component Value Date/Time   COLORURINE YELLOW 04/27/2015 1033   APPEARANCEUR CLEAR 04/27/2015 1033   LABSPEC 1.018 04/27/2015 1033   PHURINE 8.0 04/27/2015 1033   GLUCOSEU NEGATIVE 04/27/2015 1033   HGBUR NEGATIVE 04/27/2015 1033   BILIRUBINUR NEGATIVE 04/27/2015 1033   KETONESUR NEGATIVE 04/27/2015 1033   PROTEINUR NEGATIVE 04/27/2015 1033   UROBILINOGEN 0.2 04/27/2015 1033   NITRITE NEGATIVE 04/27/2015 1033   LEUKOCYTESUR NEGATIVE 04/27/2015 1033    Microbiology No results found for this or any previous visit (from the past 240 hour(s)).  Time coordinating discharge: Approximately 40 minutes  Tyrone Nine, MD  [facility] 06/08/2018, 10:11 AM Pager [phone]

## 2018-08-15 ENCOUNTER — Telehealth: Payer: Self-pay | Admitting: *Deleted

## 2018-08-15 NOTE — Telephone Encounter (Signed)
   [facility] Pre-operative Risk Assessment    Request for surgical clearance:  1. What type of surgery is being performed? COLONOSCOPY   2. When is this surgery scheduled? 09/20/2018   3. What type of clearance is required (medical clearance vs. Pharmacy clearance to hold med vs. Both)?  MEDICATION  4. Are there any medications that need to be held prior to surgery and how long? PLAVIX--HOW MANY DAYS TO HOLD THIS PRIOR TO COLONOSCOPY?   5. Practice name and name of physician performing surgery?  [facility]--DR. MAGOD   6. What is your office phone number [phone]    7.   What is your office fax number [phone]  8.   Anesthesia type (None, local, MAC, general) ?  NOT Tonita Phoenix 08/15/2018, 3:29 PM  _________________________________________________________________   (provider comments below)

## 2018-08-16 NOTE — Telephone Encounter (Signed)
   Primary Cardiologist: Kristeen Miss, MD  Chart reviewed as part of pre-operative protocol coverage. Given past medical history and time since last visit, based on ACC/AHA guidelines, Thomas [name] would be at acceptable risk for the planned procedure without further cardiovascular testing.    Per Dr. Harvie Bridge note " He's had thrombus formation on the proximal edge of the stent that embolized down the left circumflex artery when he was off Plavix. Because of this we have kept him on Plavix lifelong   He has had 2 hip replacements while on Plavix and did very well.   He should remain on Plavix lifelong".    Reviewed personally with Dr. Elease Hashimoto who recommends continue ASA and plavix without interruption given hx of thombosis. If absolutely required for routine procedure,  Continue ASA and hold Plavix only on day and day prior of procedure.   I will route this recommendation to the requesting party via Epic fax function and remove from pre-op pool.  Please call with questions.  [place], [place] 08/16/2018, 3:12 PM

## 2019-03-13 ENCOUNTER — Telehealth: Payer: Self-pay | Admitting: Nurse Practitioner

## 2019-03-13 NOTE — Telephone Encounter (Signed)

## 2019-03-18 ENCOUNTER — Telehealth: Payer: Self-pay | Admitting: Cardiovascular Disease

## 2019-03-18 NOTE — Telephone Encounter (Signed)
Spoke with patient.  Confirmed all demographics. Patient does not have internet, a computer or e-mail. He prefers a phone conversation for his visit.

## 2019-03-24 NOTE — Progress Notes (Signed)
Virtual Visit via Telephone Note   This visit type was conducted due to national recommendations for restrictions regarding the COVID-19 Pandemic (e.g. social distancing) in an effort to limit this patient's exposure and mitigate transmission in our community.  Due to his co-morbid illnesses, this patient is at least at moderate risk for complications without adequate follow up.  This format is felt to be most appropriate for this patient at this time.  The patient did not have access to video technology/had technical difficulties with video requiring transitioning to audio format only (telephone).  All issues noted in this document were discussed and addressed.  No physical exam could be performed with this format.  Please refer to the patient's chart for his  consent to telehealth for [facility].   Evaluation Performed:  Follow-up visit  Date:  03/25/2019   ID:  Thomas [name], Thomas [name] [birth year], MRN [id number]  Patient Location: Home  Provider Location: Office  PCP:  Kaleen Mask, MD  Cardiologist:  Kristeen Miss, MD  Electrophysiologist:  None   Chief Complaint:  CAD,   History of Present Illness:    Thomas [name] is a 78 y.o. male who presents via audio/video conferencing for a telehealth visit today.    Mr. Tingler is a 78 year old gentleman with a history of coronary artery disease.  He status post stenting of his proximal/ostial left anterior descending artery.  The stent hangs out into the left main slightly.  He has been on lifelong Coumadin because of a stent thrombosis with embolization down the circumflex artery.  In June, 2019, he was hospitalized with acute cholecystitis.  He had a laparoscopic cholecystectomy on June 06, 2018.  Plavix was continued through the procedure.  He had an uncomplicated postoperative course.  Since then, he has done well. Had a colonoscopy while on Plavix and the procedue  No CP or dyspnea .   The patient does not have symptoms  concerning for COVID-19 infection (fever, chills, cough, or new shortness of breath).    Past Medical History:  Diagnosis Date  . Arthritis   . Coronary artery disease    takes Plavix and ASA daily  . GERD (gastroesophageal reflux disease)    occasional and will take Tums if needed  . History of blood clots 2008   in chest  . History of kidney stones late 90's  . Hyperlipidemia    takes Simvastatin daily  . Hypertension    takes Maxzide daily  . Hypothyroidism    takes Synthroid daily  . Joint pain   . Joint swelling    left knee  . Pneumonia    at 78 years old, double   Past Surgical History:  Procedure Laterality Date  . CARDIAC CATHETERIZATION    . CHOLECYSTECTOMY N/A 06/06/2018   Procedure: LAPAROSCOPIC CHOLECYSTECTOMY WITH INTRAOPERATIVE CHOLANGIOGRAM;  Surgeon: Manus Rudd, MD;  Location: [facility] OR;  Service: General;  Laterality: N/A;  . CORONARY ANGIOPLASTY  05/21/2007   lad stent/taxus stent  . HEMORROIDECTOMY  late 1970's  . HERNIA REPAIR Right 1980's  . TOTAL HIP ARTHROPLASTY Right 10/28/2013   Procedure: RIGHT TOTAL HIP ARTHROPLASTY ANTERIOR APPROACH;  Surgeon: Loanne Drilling, MD;  Location: [facility] ORS;  Service: Orthopedics;  Laterality: Right;  . TOTAL HIP ARTHROPLASTY Left 05/08/2015   Procedure: LEFT TOTAL HIP ARTHROPLASTY ANTERIOR APPROACH;  Surgeon: Ollen Gross, MD;  Location: [facility] OR;  Service: Orthopedics;  Laterality: Left;     Current Meds  Medication Sig  . aspirin  EC 81 MG tablet Take 1 tablet (81 mg total) by mouth daily.  . clopidogrel (PLAVIX) 75 MG tablet Take 75 mg by mouth daily with breakfast.  . levothyroxine (SYNTHROID, LEVOTHROID) 150 MCG tablet Take 150 mcg by mouth daily.  [name]. lisinopril (PRINIVIL,ZESTRIL) 20 MG tablet Take 20 mg by mouth daily.   . nitroGLYCERIN (NITROSTAT) 0.4 MG SL tablet Place 1 tablet (0.4 mg total) under the tongue every 5 (five) minutes as needed for chest pain.  . simvastatin (ZOCOR) 40 MG tablet Take 40 mg by mouth  every evening.     Allergies:   Oxycodone; Robaxin [methocarbamol]; and Tramadol   Social History   Tobacco Use  . Smoking status: Current Every Day Smoker    Packs/day: 0.25    Years: 52.00    Pack years: 13.00    Types: Cigarettes  . Smokeless tobacco: Never Used  . Tobacco comment: trying to quit  Substance Use Topics  . Alcohol use: No  . Drug use: No     Family Hx: The patient's family history includes CAD in his mother; Heart failure in his father and mother; Pneumonia in his father.  ROS:   Please see the history of present illness.     All other systems reviewed and are negative.   Prior CV studies:   The following studies were reviewed today:    Labs/Other Tests and Data Reviewed:    EKG:  No ECG reviewed.  Recent Labs: 06/07/2018: ALT 54; Hemoglobin 12.6; Platelets 165 06/08/2018: BUN 13; Creatinine, Ser 1.33; Potassium 3.5; Sodium 141   Recent Lipid Panel Lab Results  Component Value Date/Time   CHOL 109 04/02/2018 09:19 AM   TRIG 61 06/04/2018 11:57 PM   HDL 35 (L) 04/02/2018 09:19 AM   CHOLHDL 3.1 04/02/2018 09:19 AM   CHOLHDL 4.1 02/29/2016 08:39 AM   LDLCALC 54 04/02/2018 09:19 AM    Wt Readings from Last 3 Encounters:  03/25/19 209 lb (94.8 kg)  06/08/18 210 lb 14.4 oz (95.7 kg)  04/02/18 217 lb 6.4 oz (98.6 kg)     Objective:    Vital Signs:  BP 130/82 (BP Location: Left Arm, Patient Position: Sitting, Cuff Size: Normal)   Pulse 68   Ht 6' (1.829 m)   Wt 209 lb (94.8 kg)   BMI 28.35 kg/m      ASSESSMENT & PLAN:    1. CAD :  S/p stenting .    Continue long term Plavx and ASA 81.      Will see him in 1 year.    2.  Hyperlipidemia:   Labs to be done at Dr. Jeannetta NapElkins office in several month.  Cont. Simvastatin    3.  Covid  19 education Advised social distancing.  Has felt well,  No fever.   COVID-19 Education: The signs and symptoms of COVID-19 were discussed with the patient and how to seek care for testing (follow up with  PCP or arrange E-visit).  The importance of social distancing was discussed today.  Time:   Today, I have spent  15  minutes with the patient with telehealth technology discussing the above problems.     Medication Adjustments/Labs and Tests Ordered: Current medicines are reviewed at length with the patient today.  Concerns regarding medicines are outlined above.  Tests Ordered: No orders of the defined types were placed in this encounter.  Medication Changes: No orders of the defined types were placed in this encounter.   Disposition:  Follow  up in 1 year(s)  Signed, Kristeen Miss, MD  03/25/2019 10:35 AM    [facility]

## 2019-03-25 ENCOUNTER — Ambulatory Visit: Payer: Medicare Other | Admitting: Cardiovascular Disease

## 2019-03-25 ENCOUNTER — Telehealth (INDEPENDENT_AMBULATORY_CARE_PROVIDER_SITE_OTHER): Payer: Medicare Other | Admitting: Cardiovascular Disease

## 2019-03-25 ENCOUNTER — Encounter: Payer: Self-pay | Admitting: Cardiovascular Disease

## 2019-03-25 ENCOUNTER — Other Ambulatory Visit: Payer: Self-pay

## 2019-03-25 VITALS — BP 130/82 | HR 68 | Ht 72.0 in | Wt 209.0 lb

## 2019-03-25 DIAGNOSIS — E782 Mixed hyperlipidemia: Secondary | ICD-10-CM

## 2019-03-25 DIAGNOSIS — Z7189 Other specified counseling: Secondary | ICD-10-CM | POA: Diagnosis not present

## 2019-03-25 DIAGNOSIS — I10 Essential (primary) hypertension: Secondary | ICD-10-CM

## 2019-03-25 DIAGNOSIS — I2510 Atherosclerotic heart disease of native coronary artery without angina pectoris: Secondary | ICD-10-CM

## 2019-03-25 NOTE — Patient Instructions (Signed)

## 2019-05-27 ENCOUNTER — Telehealth: Payer: Self-pay | Admitting: Cardiovascular Disease

## 2019-05-27 NOTE — Telephone Encounter (Signed)
Pt should not have to hold plavix for dental extraction

## 2019-05-27 NOTE — Telephone Encounter (Signed)
Called Dr. Melvern Sample office, and pt is having 1 tooth extracted.

## 2019-05-27 NOTE — Telephone Encounter (Signed)
New message     1. What dental office are you calling from? Dr. Leodis Rains  2. What is your office phone number?[phone]  3. What is your fax number?[phone]  4. What type of procedure is the patient having performed? Tooth Extraction  5. What date is procedure scheduled or is the patient there now? TBD  (if the patient is at the dentist's office question goes to their cardiologist if he/she is in the office.  If not, question should go to the DOD).   6. What is your question (ex. Antibiotics prior to procedure, holding medication-we need to know how long dentist wants pt to hold med)? Per Leafy Ro need to know how long plavix should be withheld?

## 2019-05-27 NOTE — Telephone Encounter (Signed)
Dr. Acie Fredrickson, does patient needs to hold Plavix for 1 tooth extraction? If yes, how long?  Per your note, patient had colonoscopy and laparoscopic cholecystectomy while on Plavix.   Please forward your response to P CV DIV PREOP.   Thank you

## 2019-06-12 DIAGNOSIS — I498 Other specified cardiac arrhythmias: Secondary | ICD-10-CM

## 2019-06-12 HISTORY — DX: Other specified cardiac arrhythmias: I49.8

## 2019-07-01 ENCOUNTER — Other Ambulatory Visit: Payer: Self-pay

## 2019-07-01 ENCOUNTER — Inpatient Hospital Stay (HOSPITAL_COMMUNITY)
Admission: EM | Admit: 2019-07-01 | Discharge: 2019-07-04 | DRG: 641 | Disposition: A | Discharge status: Home / Self Care | Payer: Medicare Other | Source: Ambulatory Visit | Attending: Internal Medicine | Admitting: Internal Medicine

## 2019-07-01 ENCOUNTER — Encounter (HOSPITAL_COMMUNITY): Payer: Self-pay | Admitting: *Deleted

## 2019-07-01 ENCOUNTER — Emergency Department (HOSPITAL_COMMUNITY): Payer: Medicare Other

## 2019-07-01 DIAGNOSIS — Z79890 Hormone replacement therapy (postmenopausal): Secondary | ICD-10-CM

## 2019-07-01 DIAGNOSIS — N183 Chronic kidney disease, stage 3 (moderate): Secondary | ICD-10-CM | POA: Diagnosis present

## 2019-07-01 DIAGNOSIS — E785 Hyperlipidemia, unspecified: Secondary | ICD-10-CM | POA: Diagnosis present

## 2019-07-01 DIAGNOSIS — I10 Essential (primary) hypertension: Secondary | ICD-10-CM | POA: Diagnosis present

## 2019-07-01 DIAGNOSIS — Z8719 Personal history of other diseases of the digestive system: Secondary | ICD-10-CM

## 2019-07-01 DIAGNOSIS — E039 Hypothyroidism, unspecified: Secondary | ICD-10-CM | POA: Diagnosis present

## 2019-07-01 DIAGNOSIS — Z7902 Long term (current) use of antithrombotics/antiplatelets: Secondary | ICD-10-CM

## 2019-07-01 DIAGNOSIS — E860 Dehydration: Principal | ICD-10-CM | POA: Diagnosis present

## 2019-07-01 DIAGNOSIS — Z79899 Other long term (current) drug therapy: Secondary | ICD-10-CM

## 2019-07-01 DIAGNOSIS — I2510 Atherosclerotic heart disease of native coronary artery without angina pectoris: Secondary | ICD-10-CM | POA: Diagnosis present

## 2019-07-01 DIAGNOSIS — N189 Chronic kidney disease, unspecified: Secondary | ICD-10-CM | POA: Diagnosis not present

## 2019-07-01 DIAGNOSIS — Z9049 Acquired absence of other specified parts of digestive tract: Secondary | ICD-10-CM

## 2019-07-01 DIAGNOSIS — Z885 Allergy status to narcotic agent status: Secondary | ICD-10-CM

## 2019-07-01 DIAGNOSIS — Z87891 Personal history of nicotine dependence: Secondary | ICD-10-CM

## 2019-07-01 DIAGNOSIS — Z20828 Contact with and (suspected) exposure to other viral communicable diseases: Secondary | ICD-10-CM | POA: Diagnosis present

## 2019-07-01 DIAGNOSIS — I951 Orthostatic hypotension: Secondary | ICD-10-CM | POA: Diagnosis present

## 2019-07-01 DIAGNOSIS — Z8249 Family history of ischemic heart disease and other diseases of the circulatory system: Secondary | ICD-10-CM

## 2019-07-01 DIAGNOSIS — R55 Syncope and collapse: Secondary | ICD-10-CM | POA: Diagnosis present

## 2019-07-01 DIAGNOSIS — N179 Acute kidney failure, unspecified: Secondary | ICD-10-CM | POA: Diagnosis present

## 2019-07-01 DIAGNOSIS — I498 Other specified cardiac arrhythmias: Secondary | ICD-10-CM

## 2019-07-01 DIAGNOSIS — Z7982 Long term (current) use of aspirin: Secondary | ICD-10-CM

## 2019-07-01 DIAGNOSIS — Z86718 Personal history of other venous thrombosis and embolism: Secondary | ICD-10-CM

## 2019-07-01 DIAGNOSIS — R0602 Shortness of breath: Secondary | ICD-10-CM

## 2019-07-01 DIAGNOSIS — I452 Bifascicular block: Secondary | ICD-10-CM | POA: Diagnosis present

## 2019-07-01 DIAGNOSIS — I129 Hypertensive chronic kidney disease with stage 1 through stage 4 chronic kidney disease, or unspecified chronic kidney disease: Secondary | ICD-10-CM | POA: Diagnosis present

## 2019-07-01 DIAGNOSIS — Z955 Presence of coronary angioplasty implant and graft: Secondary | ICD-10-CM

## 2019-07-01 DIAGNOSIS — Z886 Allergy status to analgesic agent status: Secondary | ICD-10-CM

## 2019-07-01 DIAGNOSIS — R9431 Abnormal electrocardiogram [ECG] [EKG]: Secondary | ICD-10-CM | POA: Diagnosis present

## 2019-07-01 DIAGNOSIS — I493 Ventricular premature depolarization: Secondary | ICD-10-CM | POA: Diagnosis present

## 2019-07-01 HISTORY — DX: Bifascicular block: I45.2

## 2019-07-01 LAB — CBC
HCT: 48.1 % (ref 39.0–52.0)
Hemoglobin: 15.8 g/dL (ref 13.0–17.0)
MCH: 34.6 pg — ABNORMAL HIGH (ref 26.0–34.0)
MCHC: 32.8 g/dL (ref 30.0–36.0)
MCV: 105.5 fL — ABNORMAL HIGH (ref 80.0–100.0)
Platelets: 250 10*3/uL (ref 150–400)
RBC: 4.56 MIL/uL (ref 4.22–5.81)
RDW: 12.7 % (ref 11.5–15.5)
WBC: 5.6 10*3/uL (ref 4.0–10.5)
nRBC: 0 % (ref 0.0–0.2)

## 2019-07-01 LAB — BASIC METABOLIC PANEL
Anion gap: 10 (ref 5–15)
BUN: 24 mg/dL — ABNORMAL HIGH (ref 8–23)
CO2: 22 mmol/L (ref 22–32)
Calcium: 8.9 mg/dL (ref 8.9–10.3)
Chloride: 104 mmol/L (ref 98–111)
Creatinine, Ser: 1.82 mg/dL — ABNORMAL HIGH (ref 0.61–1.24)
GFR calc Af Amer: 41 mL/min — ABNORMAL LOW (ref >60)
GFR calc non Af Amer: 35 mL/min — ABNORMAL LOW (ref >60)
Glucose, Bld: 108 mg/dL — ABNORMAL HIGH (ref 70–99)
Potassium: 4.4 mmol/L (ref 3.5–5.1)
Sodium: 136 mmol/L (ref 135–145)

## 2019-07-01 LAB — SARS CORONAVIRUS 2 BY RT PCR (HOSPITAL ORDER, PERFORMED IN ~~LOC~~ HOSPITAL LAB): SARS Coronavirus 2: NEGATIVE

## 2019-07-01 LAB — TROPONIN I (HIGH SENSITIVITY)
Troponin I (High Sensitivity): 9 ng/L (ref <18)
Troponin I (High Sensitivity): 10 ng/L (ref <18)

## 2019-07-01 LAB — MAGNESIUM: Magnesium: 1.6 mg/dL — ABNORMAL LOW (ref 1.7–2.4)

## 2019-07-01 MED ORDER — NITROGLYCERIN 0.4 MG SL SUBL: 0.4000 mg | SUBLINGUAL_TABLET | SUBLINGUAL | Status: DC | PRN

## 2019-07-01 MED ORDER — SODIUM CHLORIDE 0.9 % IV SOLN
INTRAVENOUS | Status: DC
  Administered 2019-07-01 – 2019-07-03 (×2): via INTRAVENOUS

## 2019-07-01 MED ORDER — SODIUM CHLORIDE 0.9 % IV BOLUS
500.0000 mL | Freq: Once | INTRAVENOUS | Status: AC
  Administered 2019-07-01: 18:00:00 500 mL via INTRAVENOUS

## 2019-07-01 MED ORDER — ONDANSETRON HCL 4 MG PO TABS: 4.0000 mg | ORAL_TABLET | Freq: Four times a day (QID) | ORAL | Status: DC | PRN

## 2019-07-01 MED ORDER — ONDANSETRON HCL 4 MG/2ML IJ SOLN: 4.0000 mg | Freq: Four times a day (QID) | INTRAMUSCULAR | Status: DC | PRN

## 2019-07-01 MED ORDER — ACETAMINOPHEN 325 MG PO TABS: 650.0000 mg | ORAL_TABLET | Freq: Four times a day (QID) | ORAL | Status: DC | PRN

## 2019-07-01 MED ORDER — LEVOTHYROXINE SODIUM 75 MCG PO TABS
150.0000 ug | ORAL_TABLET | Freq: Every day | ORAL | Status: DC
  Administered 2019-07-02 – 2019-07-04 (×3): 150 ug via ORAL
  Filled 2019-07-01 (×3): qty 2

## 2019-07-01 MED ORDER — ACETAMINOPHEN 650 MG RE SUPP: 650.0000 mg | Freq: Four times a day (QID) | RECTAL | Status: DC | PRN

## 2019-07-01 MED ORDER — LORATADINE 10 MG PO TABS
10.0000 mg | ORAL_TABLET | Freq: Every day | ORAL | Status: DC | PRN
  Administered 2019-07-03: 10 mg via ORAL
  Filled 2019-07-01: qty 1

## 2019-07-01 MED ORDER — ASPIRIN EC 81 MG PO TBEC
81.0000 mg | DELAYED_RELEASE_TABLET | Freq: Every day | ORAL | Status: DC
  Administered 2019-07-02 – 2019-07-04 (×3): 81 mg via ORAL
  Filled 2019-07-01 (×4): qty 1

## 2019-07-01 MED ORDER — CLOPIDOGREL BISULFATE 75 MG PO TABS
75.0000 mg | ORAL_TABLET | Freq: Every day | ORAL | Status: DC
  Administered 2019-07-02 – 2019-07-04 (×3): 75 mg via ORAL
  Filled 2019-07-01 (×3): qty 1

## 2019-07-01 MED ORDER — ENOXAPARIN SODIUM 40 MG/0.4ML ~~LOC~~ SOLN
40.0000 mg | SUBCUTANEOUS | Status: DC
  Filled 2019-07-01 (×2): qty 0.4

## 2019-07-01 MED ORDER — SIMVASTATIN 20 MG PO TABS
40.0000 mg | ORAL_TABLET | Freq: Every evening | ORAL | Status: DC
  Administered 2019-07-01 – 2019-07-03 (×3): 40 mg via ORAL
  Filled 2019-07-01 (×3): qty 2

## 2019-07-01 NOTE — H&P (Signed)
[facility]                                                                                    Patient Demographics  Thomas [name], is a 78 y.o. male  CSN: [id number]  MRN: [id number]  DOB - Aug 21, 1941  Admit Date - 07/01/2019  Outpatient Primary MD for the patient is Leonard Downing, MD   With History of -  Past Medical History:  Diagnosis Date  . Arthritis   . Coronary artery disease    takes Plavix and ASA daily  . GERD (gastroesophageal reflux disease)    occasional and will take Tums if needed  . History of blood clots 2008   in chest  . History of kidney stones late 90's  . Hyperlipidemia    takes Simvastatin daily  . Hypertension    takes Maxzide daily  . Hypothyroidism    takes Synthroid daily  . Joint pain   . Joint swelling    left knee  . Pneumonia    at 78 years old, double      Past Surgical History:  Procedure Laterality Date  . CARDIAC CATHETERIZATION    . CHOLECYSTECTOMY N/A 06/06/2018   Procedure: LAPAROSCOPIC CHOLECYSTECTOMY WITH INTRAOPERATIVE CHOLANGIOGRAM;  Surgeon: Donnie Mesa, MD;  Location: [place];  Service: General;  Laterality: N/A;  . CORONARY ANGIOPLASTY  05/21/2007   lad stent/taxus stent  . HEMORROIDECTOMY  late 1970's  . HERNIA REPAIR Right 1980's  . 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 TOTAL HIP ARTHROPLASTY Left 05/08/2015   Procedure: LEFT TOTAL HIP ARTHROPLASTY ANTERIOR APPROACH;  Surgeon: Gaynelle Arabian, MD;  Location: [place];  Service: Orthopedics;  Laterality: Left;    in for   Chief Complaint  Patient presents with  . Bradycardia     HPI  Thomas [name]  is a 78 y.o. male, with past medical history significant for CAD, hypertension, hyperlipidemia, hypothyroidism and chronic kidney disease, presenting with multiple episodes of  lightheadedness and near syncope for the last 2 days while working outside cutting trees with a chainsaw..  Patient denies any chest pains, shortness of breath, cough or sputum production.  He checked his temperature at home and he denies any fever or chills.  Upon further questioning this has been going on for few weeks but it got worse in the last 2 days.  Patient denies any loss of consciousness and he attributes it first to allergies.  He saw his primary medical doctor today who advised him to come to the emergency department after he had an EKG done. In the emergency room the patient was noted to be in mild AKI and his EKG showed bifascicular block with bigeminy's at 81 bpm.  Patient looked tired with bilateral conjunctival injection.    Review of Systems    In addition to the HPI above,  No Fever-chills, No Headache, No changes with Vision or hearing, No problems swallowing food or Liquids, No Chest pain, Cough or Shortness of Breath, No Abdominal pain, No  Nausea or Vommitting, Bowel movements are regular, No Blood in stool or Urine, No dysuria, No new skin rashes or bruises, No new joints pains-aches,  No recent weight gain or loss, No polyuria, polydypsia or polyphagia, No significant Mental Stressors.  A full 10 point Review of Systems was done, except as stated above, all other Review of Systems were negative.   Social History Social History   Tobacco Use  . Smoking status: Current Every Day Smoker    Packs/day: 0.25    Years: 52.00    Pack years: 13.00    Types: Cigarettes  . Smokeless tobacco: Never Used  . Tobacco comment: trying to quit  Substance Use Topics  . Alcohol use: No     Family History Family History  Problem Relation Age of Onset  . CAD Mother   . Heart failure Mother   . Pneumonia Father   . Heart failure Father      Prior to Admission medications   Medication Sig Start Date End Date Taking? Authorizing Provider  aspirin EC 81 MG tablet  Take 1 tablet (81 mg total) by mouth daily. 04/02/18  Yes Nahser, Deloris PingPhilip J, MD  clopidogrel (PLAVIX) 75 MG tablet Take 75 mg by mouth daily with breakfast.   Yes [provider]  levothyroxine (SYNTHROID, LEVOTHROID) 150 MCG tablet Take 150 mcg by mouth daily. 01/04/18  Yes [provider]  lisinopril (PRINIVIL,ZESTRIL) 20 MG tablet Take 20 mg by mouth daily.    Yes [provider]  loratadine (CLARITIN) 10 MG tablet Take 10 mg by mouth daily as needed for allergies.   Yes [provider]  nitroGLYCERIN (NITROSTAT) 0.4 MG SL tablet Place 1 tablet (0.4 mg total) under the tongue every 5 (five) minutes as needed for chest pain. 04/02/18  Yes Nahser, Deloris PingPhilip J, MD  simvastatin (ZOCOR) 40 MG tablet Take 40 mg by mouth every evening.   Yes [provider]    Allergies  Allergen Reactions  . Oxycodone Nausea Only  . Robaxin [Methocarbamol] Nausea Only  . Tramadol Nausea Only and Other (See Comments)    Hallucinations    Physical Exam  Vitals  Blood pressure 140/63, pulse (!) 35, resp. rate (!) 28, height 6' (1.829 m), weight 91.2 kg, SpO2 97 %.  General appearance looks tired, in no acute distress at this time HEENT mild conjunctival in injection but no facial deviation oral thrush Neck supple, no neck vein distention Chest clear with decreased breath sounds at the bases Heart irregular with no murmurs gallops rubs Abdomen soft, nontender, bowel sounds are present Extremities no clubbing cyanosis or edema Neuro grossly nonfocal, patient moving all extremities Skin no rashes or ulcers  Data Review  CBC Recent Labs  Lab 07/01/19 1418  WBC 5.6  HGB 15.8  HCT 48.1  PLT 250  MCV 105.5*  MCH 34.6*  MCHC 32.8  RDW 12.7   ------------------------------------------------------------------------------------------------------------------  Chemistries  Recent Labs  Lab 07/01/19 1418  NA 136  K 4.4  CL 104  CO2 22  GLUCOSE 108*  BUN 24*   CREATININE 1.82*  CALCIUM 8.9  MG 1.6*   ------------------------------------------------------------------------------------------------------------------ estimated creatinine clearance is 37.3 mL/min (A) (by C-G formula based on SCr of 1.82 mg/dL (H)). ------------------------------------------------------------------------------------------------------------------ No results for input(s): TSH, T4TOTAL, T3FREE, THYROIDAB in the last 72 hours.  Invalid input(s): FREET3   Coagulation profile No results for input(s): INR, PROTIME in the last 168 hours. ------------------------------------------------------------------------------------------------------------------- No results for input(s): DDIMER in the last 72 hours. -------------------------------------------------------------------------------------------------------------------  Cardiac Enzymes No results for input(s): CKMB, TROPONINI, MYOGLOBIN in the last 168 hours.  Invalid input(s): CK ------------------------------------------------------------------------------------------------------------------ Invalid input(s): POCBNP   ---------------------------------------------------------------------------------------------------------------  Urinalysis    Component Value Date/Time   COLORURINE YELLOW 04/27/2015 1033   APPEARANCEUR CLEAR 04/27/2015 1033   LABSPEC 1.018 04/27/2015 1033   PHURINE 8.0 04/27/2015 1033   GLUCOSEU NEGATIVE 04/27/2015 1033   HGBUR NEGATIVE 04/27/2015 1033   BILIRUBINUR NEGATIVE 04/27/2015 1033   KETONESUR NEGATIVE 04/27/2015 1033   PROTEINUR NEGATIVE 04/27/2015 1033   UROBILINOGEN 0.2 04/27/2015 1033   NITRITE NEGATIVE 04/27/2015 1033   LEUKOCYTESUR NEGATIVE 04/27/2015 1033    ----------------------------------------------------------------------------------------------------------------   Imaging results:   Dg Chest Port 1 View  Result Date: 07/01/2019 CLINICAL DATA:  Progressive  shortness of breath. EXAM: PORTABLE CHEST 1 VIEW COMPARISON:  06/04/2018 FINDINGS: The heart size and mediastinal contours are within normal limits. Both lungs are clear. The visualized skeletal structures are unremarkable. IMPRESSION: No active disease. Electronically Signed   By: Francene BoyersJames  Maxwell M.D.   On: 07/01/2019 14:51    My personal review of EKG: Rhythm NSR, at 81 bpm with bifascicular block and bigeminy  Personally reviewed Old Chart from   Assessment & Plan  Dehydration/acute kidney injury on chronic renal failure stage III We will give slow hydration and monitor  Near syncopal episode probably a combination of dehydration and working outside at high temperatures cutting wood. Serial troponins are 9-10 and do not look significant  Bifascicular block.  Patient has a history of right bundle blanch block and now presenting with bigeminys.  Cardiology might be consulted if rhythm does not get back to normal.  Patient follows with Dr. Elease HashimotoNahser from cardiology  Status post cholecystectomy for gall stone pancreatitis in 2019  Essential hypertension Lisinopril was put on hold  Hypothyroidism Continue Synthroid    DVT Prophylaxis Lovenox  AM Labs Ordered, also please review Full Orders  Family Communication: I called the spouse was called at home, no answer  Code Status full  Disposition Plan: Home  Time spent in minutes : 43 minutes  Condition GUARDED   @SIGNATURE @

## 2019-07-01 NOTE — ED Notes (Signed)
Pt endorses sob X2 weeks, gradually worsening. Not usually on O2. Pt pulse noted to be at 34. ED PA to bedside.

## 2019-07-01 NOTE — ED Provider Notes (Signed)
[facility] Provider Note   CSN: [id number] Arrival date & time: 07/01/19  1400     History   Chief Complaint Chief Complaint  Patient presents with  . Bradycardia    HPI Bernie CoveyClifford H Holts is a 78 y.o. male who presents with SOB. PMH significant for CAD, HTN, HLD, hypothyroid, CKD. He states that for the past several weeks he has had intermittent SOB. He thinks it's from when he was cutting trees with a chainsaw and wasn't wearing a mask. Since then, it's hard to get a good breath at times. Over the past 1-2 days he has had episodes of lightheadedness and feeling like he was going to pass out along with feeling hot and sweats. He will check his temp during these episodes and it'll be normal. He was mowing his lawn this morning and symptoms became worse so he went to his doctor's office. They did an EKG and advised him to come to the ED. He denies any syncope, chest pain, cough, or leg swelling. His cardiologist is Dr. Elease HashimotoNahser.     HPI  Past Medical History:  Diagnosis Date  . Arthritis   . Coronary artery disease    takes Plavix and ASA daily  . GERD (gastroesophageal reflux disease)    occasional and will take Tums if needed  . History of blood clots 2008   in chest  . History of kidney stones late 90's  . Hyperlipidemia    takes Simvastatin daily  . Hypertension    takes Maxzide daily  . Hypothyroidism    takes Synthroid daily  . Joint pain   . Joint swelling    left knee  . Pneumonia    at 78 years old, double    Patient Active Problem List   Diagnosis Date Noted  . Preoperative cardiovascular examination   . Abdominal pain 06/04/2018  . Acute pancreatitis 06/04/2018  . ARF (acute renal failure) (HCC) 06/04/2018  . Gallstones 06/04/2018  . Hypokalemia 10/30/2013  . Postoperative anemia due to acute blood loss 10/29/2013  . OA (osteoarthritis) of hip 10/28/2013  . CAD (coronary artery disease) 05/13/2011  . HTN  (hypertension) 05/13/2011  . Hyperlipidemia 05/13/2011    Past Surgical History:  Procedure Laterality Date  . CARDIAC CATHETERIZATION    . CHOLECYSTECTOMY N/A 06/06/2018   Procedure: LAPAROSCOPIC CHOLECYSTECTOMY WITH INTRAOPERATIVE CHOLANGIOGRAM;  Surgeon: Manus Ruddsuei, Matthew, MD;  [facility];  Service: General;  Laterality: N/A;  . CORONARY ANGIOPLASTY  05/21/2007   lad stent/taxus stent  . HEMORROIDECTOMY  late 1970's  . HERNIA REPAIR Right 1980's  . TOTAL HIP ARTHROPLASTY Right 10/28/2013   Procedure: RIGHT TOTAL HIP ARTHROPLASTY ANTERIOR APPROACH;  Surgeon: Loanne DrillingFrank V Aluisio, MD;  Location: [facility];  Service: Orthopedics;  Laterality: Right;  . TOTAL HIP ARTHROPLASTY Left 05/08/2015   Procedure: LEFT TOTAL HIP ARTHROPLASTY ANTERIOR APPROACH;  Surgeon: Ollen GrossFrank Aluisio, MD;  Location: [facility];  Service: Orthopedics;  Laterality: Left;        Home Medications    Prior to Admission medications   Medication Sig Start Date End Date Taking? Authorizing Provider  aspirin EC 81 MG tablet Take 1 tablet (81 mg total) by mouth daily. 04/02/18   Nahser, Deloris PingPhilip J, MD  clopidogrel (PLAVIX) 75 MG tablet Take 75 mg by mouth daily with breakfast.    [provider]  levothyroxine (SYNTHROID, LEVOTHROID) 150 MCG tablet Take 150 mcg by mouth daily. 01/04/18   [provider]  lisinopril (PRINIVIL,ZESTRIL)  20 MG tablet Take 20 mg by mouth daily.     [provider]  nitroGLYCERIN (NITROSTAT) 0.4 MG SL tablet Place 1 tablet (0.4 mg total) under the tongue every 5 (five) minutes as needed for chest pain. 04/02/18   Nahser, Deloris PingPhilip J, MD  simvastatin (ZOCOR) 40 MG tablet Take 40 mg by mouth every evening.    [provider]    Family History Family History  Problem Relation Age of Onset  . CAD Mother   . Heart failure Mother   . Pneumonia Father   . Heart failure Father     Social History Social History   Tobacco Use  . Smoking status: Current Every Day Smoker     Packs/day: 0.25    Years: 52.00    Pack years: 13.00    Types: Cigarettes  . Smokeless tobacco: Never Used  . Tobacco comment: trying to quit  Substance Use Topics  . Alcohol use: No  . Drug use: No     Allergies   Oxycodone, Robaxin [methocarbamol], and Tramadol   Review of Systems Review of Systems  Constitutional: Positive for diaphoresis and fatigue. Negative for activity change, appetite change and fever.  Respiratory: Positive for shortness of breath. Negative for cough and wheezing.   Cardiovascular: Negative for chest pain, palpitations and leg swelling.  Gastrointestinal: Negative for abdominal pain, nausea and vomiting.  Neurological: Negative for syncope and headaches.  All other systems reviewed and are negative.    Physical Exam Updated Vital Signs BP (!) 156/75 (BP Location: Right Arm)   Pulse (!) 41   Temp 97.8 F (36.6 C) (Oral)   Resp 18   Ht 6' (1.829 m)   Wt 91.2 kg   SpO2 98%   BMI 27.26 kg/m   Physical Exam Vitals signs and nursing note reviewed.  Constitutional:      General: He is not in acute distress.    Appearance: Normal appearance. He is well-developed. He is not ill-appearing.     Comments: Chronically ill appearing, NAD  HENT:     Head: Normocephalic and atraumatic.  Eyes:     General: No scleral icterus.       Right eye: No discharge.        Left eye: No discharge.     Conjunctiva/sclera: Conjunctivae normal.     Pupils: Pupils are equal, round, and reactive to light.  Neck:     Musculoskeletal: Normal range of motion.  Cardiovascular:     Rate and Rhythm: Normal rate. Rhythm irregular.     Comments: Frequent bigemeny Pulmonary:     Effort: Pulmonary effort is normal. Tachypnea present. No respiratory distress.  Chest:     Chest wall: No tenderness.  Abdominal:     General: There is no distension.     Palpations: Abdomen is soft.     Tenderness: There is no abdominal tenderness.  Skin:    General: Skin is warm and dry.   Neurological:     Mental Status: He is alert and oriented to person, place, and time.  Psychiatric:        Behavior: Behavior normal.      ED Treatments / Results  Labs (all labs ordered are listed, but only abnormal results are displayed) Labs Reviewed  BASIC METABOLIC PANEL - Abnormal; Notable for the following components:      Result Value   Glucose, Bld 108 (*)    BUN 24 (*)    Creatinine, Ser 1.82 (*)  GFR calc non Af Amer 35 (*)    GFR calc Af Amer 41 (*)    All other components within normal limits  CBC - Abnormal; Notable for the following components:   MCV 105.5 (*)    MCH 34.6 (*)    All other components within normal limits  MAGNESIUM - Abnormal; Notable for the following components:   Magnesium 1.6 (*)    All other components within normal limits  SARS CORONAVIRUS 2 (HOSPITAL ORDER, PERFORMED IN [facility])  BASIC METABOLIC PANEL  CBC  TROPONIN I (HIGH SENSITIVITY)  TROPONIN I (HIGH SENSITIVITY)    EKG EKG Interpretation  Date/Time:  Monday July 01 2019 14:07:06 EDT Ventricular Rate:  81 PR Interval:  156 QRS Duration: 140 QT Interval:  412 QTC Calculation: 478 R Axis:   -46 Text Interpretation:  Sinus rhythm with marked sinus arrhythmia with Premature supraventricular complexes and with occasional Premature ventricular complexes Right bundle branch block Left anterior fascicular block  Bifascicular block  Abnormal ECG frequent pvc Confirmed by Melene PlanFloyd, Dan [phone]([id number]) on 07/01/2019 2:11:03 PM   Radiology Dg Chest Port 1 View  Result Date: 07/01/2019 CLINICAL DATA:  Progressive shortness of breath. EXAM: PORTABLE CHEST 1 VIEW COMPARISON:  06/04/2018 FINDINGS: The heart size and mediastinal contours are within normal limits. Both lungs are clear. The visualized skeletal structures are unremarkable. IMPRESSION: No active disease. Electronically Signed   By: Francene BoyersJames  Maxwell M.D.   On: 07/01/2019 14:51    Procedures Procedures (including  critical care time)  Medications Ordered in ED Medications  aspirin EC tablet 81 mg (has no administration in time range)  nitroGLYCERIN (NITROSTAT) SL tablet 0.4 mg (has no administration in time range)  simvastatin (ZOCOR) tablet 40 mg (has no administration in time range)  levothyroxine (SYNTHROID) tablet 150 mcg (has no administration in time range)  clopidogrel (PLAVIX) tablet 75 mg (has no administration in time range)  loratadine (CLARITIN) tablet 10 mg (has no administration in time range)  enoxaparin (LOVENOX) injection 40 mg (has no administration in time range)  0.9 %  sodium chloride infusion (has no administration in time range)  acetaminophen (TYLENOL) tablet 650 mg (has no administration in time range)    Or  acetaminophen (TYLENOL) suppository 650 mg (has no administration in time range)  ondansetron (ZOFRAN) tablet 4 mg (has no administration in time range)    Or  ondansetron (ZOFRAN) injection 4 mg (has no administration in time range)  sodium chloride 0.9 % bolus 500 mL (500 mLs Intravenous New Bag/Given 07/01/19 1757)     Initial Impression / Assessment and Plan / ED Course  I have reviewed the triage vital signs and the nursing notes.  Pertinent labs & imaging results that were available during my care of the patient were reviewed by me and considered in my medical decision making (see chart for details).  78 year old male presents with shortness of breath and generalized weakness.  Symptoms have been ongoing for several weeks and worsening over the past couple of days.  He is tachypneic but otherwise vital signs are normal.  His heart rate on the pulse ox is reading in the 30s however on the heart monitor it is 60-70.  He is having multiple PVCs.  Shared visit with Dr. Adela LankFloyd.  Will obtain cardiac work-up  CBC is remarkable for high MCV.  BMP is remarkable for elevated BUN and creatinine from his baseline.  Magnesium is ordered and pending.  Initial and second  troponin are  9 and 10 respectively.  Portable chest x-ray is normal.  4:34 PM Spoke with Cardiology who recommends medical admission as symptoms are likely from dehydration.   Discussed with Dr. Laren Everts with [facility] who will come to see the patient.   Final Clinical Impressions(s) / ED Diagnoses   Final diagnoses:  Bigeminy  Shortness of breath  AKI (acute kidney injury) [facility])    ED Discharge Orders    None       Recardo Evangelist, PA-C 07/01/19 2013    Deno Etienne, DO 07/02/19 1514

## 2019-07-01 NOTE — ED Triage Notes (Signed)
PT via EMS from PCP office after being seen for weakness , SHOB  Which has been going on for 1 week . PT reports  SHOB worse today while mowing lawn. On arrival PT HR 38.

## 2019-07-02 ENCOUNTER — Other Ambulatory Visit: Payer: Self-pay

## 2019-07-02 ENCOUNTER — Encounter (HOSPITAL_COMMUNITY): Payer: Self-pay | Admitting: Internal Medicine

## 2019-07-02 ENCOUNTER — Observation Stay (HOSPITAL_BASED_OUTPATIENT_CLINIC_OR_DEPARTMENT_OTHER): Payer: Medicare Other

## 2019-07-02 DIAGNOSIS — N183 Chronic kidney disease, stage 3 (moderate): Secondary | ICD-10-CM | POA: Diagnosis not present

## 2019-07-02 DIAGNOSIS — Z885 Allergy status to narcotic agent status: Secondary | ICD-10-CM | POA: Diagnosis not present

## 2019-07-02 DIAGNOSIS — I493 Ventricular premature depolarization: Secondary | ICD-10-CM | POA: Insufficient documentation

## 2019-07-02 DIAGNOSIS — R55 Syncope and collapse: Secondary | ICD-10-CM

## 2019-07-02 DIAGNOSIS — I452 Bifascicular block: Secondary | ICD-10-CM | POA: Diagnosis not present

## 2019-07-02 DIAGNOSIS — Z86718 Personal history of other venous thrombosis and embolism: Secondary | ICD-10-CM | POA: Diagnosis not present

## 2019-07-02 DIAGNOSIS — R9431 Abnormal electrocardiogram [ECG] [EKG]: Secondary | ICD-10-CM | POA: Diagnosis present

## 2019-07-02 DIAGNOSIS — Z79890 Hormone replacement therapy (postmenopausal): Secondary | ICD-10-CM | POA: Diagnosis not present

## 2019-07-02 DIAGNOSIS — I129 Hypertensive chronic kidney disease with stage 1 through stage 4 chronic kidney disease, or unspecified chronic kidney disease: Secondary | ICD-10-CM | POA: Diagnosis not present

## 2019-07-02 DIAGNOSIS — I951 Orthostatic hypotension: Secondary | ICD-10-CM | POA: Diagnosis not present

## 2019-07-02 DIAGNOSIS — Z20828 Contact with and (suspected) exposure to other viral communicable diseases: Secondary | ICD-10-CM | POA: Diagnosis not present

## 2019-07-02 DIAGNOSIS — I2510 Atherosclerotic heart disease of native coronary artery without angina pectoris: Secondary | ICD-10-CM | POA: Diagnosis not present

## 2019-07-02 DIAGNOSIS — Z886 Allergy status to analgesic agent status: Secondary | ICD-10-CM | POA: Diagnosis not present

## 2019-07-02 DIAGNOSIS — Z7902 Long term (current) use of antithrombotics/antiplatelets: Secondary | ICD-10-CM | POA: Diagnosis not present

## 2019-07-02 DIAGNOSIS — Z7982 Long term (current) use of aspirin: Secondary | ICD-10-CM | POA: Diagnosis not present

## 2019-07-02 DIAGNOSIS — E785 Hyperlipidemia, unspecified: Secondary | ICD-10-CM | POA: Diagnosis not present

## 2019-07-02 DIAGNOSIS — E860 Dehydration: Secondary | ICD-10-CM | POA: Diagnosis not present

## 2019-07-02 DIAGNOSIS — E039 Hypothyroidism, unspecified: Secondary | ICD-10-CM | POA: Diagnosis not present

## 2019-07-02 DIAGNOSIS — I340 Nonrheumatic mitral (valve) insufficiency: Secondary | ICD-10-CM | POA: Diagnosis not present

## 2019-07-02 DIAGNOSIS — Z79899 Other long term (current) drug therapy: Secondary | ICD-10-CM | POA: Diagnosis not present

## 2019-07-02 DIAGNOSIS — Z955 Presence of coronary angioplasty implant and graft: Secondary | ICD-10-CM | POA: Diagnosis not present

## 2019-07-02 DIAGNOSIS — N179 Acute kidney failure, unspecified: Secondary | ICD-10-CM | POA: Diagnosis not present

## 2019-07-02 DIAGNOSIS — I371 Nonrheumatic pulmonary valve insufficiency: Secondary | ICD-10-CM

## 2019-07-02 DIAGNOSIS — Z9049 Acquired absence of other specified parts of digestive tract: Secondary | ICD-10-CM | POA: Diagnosis not present

## 2019-07-02 DIAGNOSIS — I499 Cardiac arrhythmia, unspecified: Secondary | ICD-10-CM

## 2019-07-02 DIAGNOSIS — Z8249 Family history of ischemic heart disease and other diseases of the circulatory system: Secondary | ICD-10-CM | POA: Diagnosis not present

## 2019-07-02 DIAGNOSIS — Z8719 Personal history of other diseases of the digestive system: Secondary | ICD-10-CM | POA: Diagnosis not present

## 2019-07-02 DIAGNOSIS — Z87891 Personal history of nicotine dependence: Secondary | ICD-10-CM | POA: Diagnosis not present

## 2019-07-02 LAB — CBC
HCT: 43 % (ref 39.0–52.0)
Hemoglobin: 14.3 g/dL (ref 13.0–17.0)
MCH: 34.6 pg — ABNORMAL HIGH (ref 26.0–34.0)
MCHC: 33.3 g/dL (ref 30.0–36.0)
MCV: 104.1 fL — ABNORMAL HIGH (ref 80.0–100.0)
Platelets: 204 10*3/uL (ref 150–400)
RBC: 4.13 MIL/uL — ABNORMAL LOW (ref 4.22–5.81)
RDW: 12.8 % (ref 11.5–15.5)
WBC: 5.3 10*3/uL (ref 4.0–10.5)
nRBC: 0 % (ref 0.0–0.2)

## 2019-07-02 LAB — BASIC METABOLIC PANEL
Anion gap: 5 (ref 5–15)
BUN: 22 mg/dL (ref 8–23)
CO2: 24 mmol/L (ref 22–32)
Calcium: 8.6 mg/dL — ABNORMAL LOW (ref 8.9–10.3)
Chloride: 110 mmol/L (ref 98–111)
Creatinine, Ser: 1.54 mg/dL — ABNORMAL HIGH (ref 0.61–1.24)
GFR calc Af Amer: 50 mL/min — ABNORMAL LOW (ref >60)
GFR calc non Af Amer: 43 mL/min — ABNORMAL LOW (ref >60)
Glucose, Bld: 99 mg/dL (ref 70–99)
Potassium: 4.8 mmol/L (ref 3.5–5.1)
Sodium: 139 mmol/L (ref 135–145)

## 2019-07-02 LAB — ECHOCARDIOGRAM COMPLETE
Height: 72 in
Weight: 3203.2 oz

## 2019-07-02 MED ORDER — MAGNESIUM SULFATE 2 GM/50ML IV SOLN
2.0000 g | Freq: Once | INTRAVENOUS | Status: AC
  Administered 2019-07-02: 08:00:00 2 g via INTRAVENOUS
  Filled 2019-07-02: qty 50

## 2019-07-02 NOTE — Progress Notes (Signed)
NP from the patient doctor office called to notified/ have in the record that the patient HR was down in 40 at the doctor office yesterday. Will continue to monitor the patient.

## 2019-07-02 NOTE — Progress Notes (Signed)
[facility] PROGRESS NOTE  Thomas [name] RKY:[id number] DOB: April 07, 1941 DOA: 07/01/2019 PCP: Leonard Downing, MD  Assessment/Plan:  Near syncope/abnormal ekg/sob. 2 week hx intermittent dyspnea with exertion. Yesterday near syncope with dizziness, diaphoresis, sob, feeling very hot. No chest pain/palpitations, cough or LEE. ekg with  Bifascicular block with bigeminy. High sensativity trops 9>10. Chest xray with no active disease. HR range since admitted 31-134.  -obtain TSH -orthostatic vital signs -echo -gentle IV fluids -cards consult  Dehydration/acute kidney injury on chronic renal failure stage III. Creatinine trending down closer to baseline. -hold nephrotoxins -gentle IV fluids -monitor urine output -recheck  In am  Hypomagnesemia. Mag level 1.6.  -replete  Status post cholecystectomy for gall stone pancreatitis in 2019  Essential hypertension. Fair control. Home meds include lisinopril -hold Lisinopril   CAD s/p stents. No chest pain. ekg as noted above. Chart review indicates cards visit 4/20 (televisit).  -continue plavix and asa 81  Hypothyroidism Continue Synthroid  Code Status: full Family Communication: wendy (daughter) on phone Disposition Plan: home when ready   Consultants:  cardmaster  Procedures:  echo  Antibiotics:    HPI/Subjective: 78 yo admitted near syncope and acute kidney injury superimposed on ckd III found to have abnormal ekg. Hx bifascicular block now with bigeminy. Cards consulted.  Objective: Vitals:   07/02/19 0500 07/02/19 0725  BP:  (!) 128/59  Pulse: 60 (!) 134  Resp:  18  Temp:  97.8 F (36.6 C)  SpO2:  97%    Intake/Output Summary (Last 24 hours) at 07/02/2019 0916 Last data filed at 07/02/2019 3151 Gross per 24 hour  Intake 625.09 ml  Output 850 ml  Net -224.91 ml   Filed Weights   07/01/19 1426 07/02/19 0346  Weight: 91.2 kg 90.8 kg    Exam:   General:  Awake alert no acute  distress  Cardiovascular: irregular, no MGR no LE edema  Respiratory: normal effort BS clear bilaterally no wheeze  Abdomen: soft non-distended +BS no guarding or rebounding  Musculoskeletal: joints without swelling/erythema   Data Reviewed: Basic Metabolic Panel: Recent Labs  Lab 07/01/19 1418 07/02/19 0430  NA 136 139  K 4.4 4.8  CL 104 110  CO2 22 24  GLUCOSE 108* 99  BUN 24* 22  CREATININE 1.82* 1.54*  CALCIUM 8.9 8.6*  MG 1.6*  --    Liver Function Tests: No results for input(s): AST, ALT, ALKPHOS, BILITOT, PROT, ALBUMIN in the last 168 hours. No results for input(s): LIPASE, AMYLASE in the last 168 hours. No results for input(s): AMMONIA in the last 168 hours. CBC: Recent Labs  Lab 07/01/19 1418 07/02/19 0430  WBC 5.6 5.3  HGB 15.8 14.3  HCT 48.1 43.0  MCV 105.5* 104.1*  PLT 250 204   Cardiac Enzymes: No results for input(s): CKTOTAL, CKMB, CKMBINDEX, TROPONINI in the last 168 hours. BNP (last 3 results) No results for input(s): BNP in the last 8760 hours.  ProBNP (last 3 results) No results for input(s): PROBNP in the last 8760 hours.  CBG: No results for input(s): GLUCAP in the last 168 hours.  Recent Results (from the past 240 hour(s))  SARS Coronavirus 2 (CEPHEID- Performed in [facility] hospital lab), Hosp Order     Status: None   Collection Time: 07/01/19  2:59 PM   Specimen: Nasopharyngeal Swab  Result Value Ref Range Status   SARS Coronavirus 2 NEGATIVE NEGATIVE Final    Comment: (NOTE) If result is NEGATIVE SARS-CoV-2 target nucleic acids are NOT  DETECTED. The SARS-CoV-2 RNA is generally detectable in upper and lower  respiratory specimens during the acute phase of infection. The lowest  concentration of SARS-CoV-2 viral copies this assay can detect is 250  copies / mL. A negative result does not preclude SARS-CoV-2 infection  and should not be used as the sole basis for treatment or other  patient management decisions.  A negative  result may occur with  improper specimen collection / handling, submission of specimen other  than nasopharyngeal swab, presence of viral mutation(s) within the  areas targeted by this assay, and inadequate number of viral copies  (<250 copies / mL). A negative result must be combined with clinical  observations, patient history, and epidemiological information. If result is POSITIVE SARS-CoV-2 target nucleic acids are DETECTED. The SARS-CoV-2 RNA is generally detectable in upper and lower  respiratory specimens dur ing the acute phase of infection.  Positive  results are indicative of active infection with SARS-CoV-2.  Clinical  correlation with patient history and other diagnostic information is  necessary to determine patient infection status.  Positive results do  not rule out bacterial infection or co-infection with other viruses. If result is PRESUMPTIVE POSTIVE SARS-CoV-2 nucleic acids MAY BE PRESENT.   A presumptive positive result was obtained on the submitted specimen  and confirmed on repeat testing.  While 2019 novel coronavirus  (SARS-CoV-2) nucleic acids may be present in the submitted sample  additional confirmatory testing may be necessary for epidemiological  and / or clinical management purposes  to differentiate between  SARS-CoV-2 and other Sarbecovirus currently known to infect humans.  If clinically indicated additional testing with an alternate test  methodology [phone]([id number]) is advised. The SARS-CoV-2 RNA is generally  detectable in upper and lower respiratory sp ecimens during the acute  phase of infection. The expected result is Negative. Fact Sheet for Patients:  BoilerBrush.com.cyhttps://www.fda.gov/media/136312/download Fact Sheet for Healthcare Providers: https://pope.com/https://www.fda.gov/media/136313/download This test is not yet approved or cleared by the [place] FDA and has been authorized for detection and/or diagnosis of SARS-CoV-2 by FDA under an Emergency Use Authorization  (EUA).  This EUA will remain in effect (meaning this test can be used) for the duration of the COVID-19 declaration under Section 564(b)(1) of the Act, 21 U.S.C. section 360bbb-3(b)(1), unless the authorization is terminated or revoked sooner. Performed at [facility], [address] [place], [place] [address]      Studies: Dg Chest Port 1 View  Result Date: 07/01/2019 CLINICAL DATA:  Progressive shortness of breath. EXAM: PORTABLE CHEST 1 VIEW COMPARISON:  06/04/2018 FINDINGS: The heart size and mediastinal contours are within normal limits. Both lungs are clear. The visualized skeletal structures are unremarkable. IMPRESSION: No active disease. Electronically Signed   By: Francene BoyersJames  Maxwell M.D.   On: 07/01/2019 14:51    Scheduled Meds: . aspirin EC  81 mg Oral Daily  . clopidogrel  75 mg Oral Q breakfast  . enoxaparin (LOVENOX) injection  40 mg Subcutaneous Q24H  . levothyroxine  150 mcg Oral Q0600  . simvastatin  40 mg Oral QPM   Continuous Infusions: . sodium chloride 75 mL/hr at 07/01/19 2055    Principal Problem:   Near syncope Active Problems:   Acute kidney injury superimposed on chronic kidney disease (HCC)   Nonspecific abnormal electrocardiogram (ECG) (EKG)   CAD (coronary artery disease)   HTN (hypertension)   Hypomagnesemia   Hyperlipidemia    Time spent: 45 minutes    Gwenyth BenderBLACK, M NP  [facility]  If 7PM-7AM, please contact night-coverage at [email or web address], password [email or web address] 07/02/2019, 9:16 AM  LOS: 0 days

## 2019-07-02 NOTE — Care Management Obs Status (Signed)
[facility] NOTIFICATION   Patient Details  Name: NAWAF STRANGE MRN: [id number] Date of Birth: 08/06/1941   Medicare Observation Status Notification Given:  Yes    Sharin Mons, RN 07/02/2019, 8:54 AM

## 2019-07-02 NOTE — Consult Note (Signed)
Cardiology Consultation:   Patient ID: [name] MRN: [id number]; DOB: 02/11/1941  Admit date: 07/01/2019 Date of Consult: 07/02/2019  Primary Care Provider: Kaleen Mask, MD Primary Cardiologist: Thomas Miss, MD     Patient Profile:   Thomas [name] is a 78 y.o. 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  History of Present Illness:   Thomas [name] is a 78 year old male with the above history who is followed by Dr. Elease [name]. Patient was last seen by Dr Thomas [name] for a telehealth visit on 03/25/2019 at which time he was doing well and denied any cardiac symptoms.  Patient presented to the [facility] ED yesterday via EMS from his PCP for evaluation of weakness and lightheadedness.  Patient says started "feeling bad" about 2 weeks ago after doing yard work and cutting trees/bushes. He initially thought it was allergies due to the pollen as he did not wear a [name] while doing the yard work. He reports "crud in the back of his throat" and a sporadic cough. He also reports dyspnea with exertion over the past 2 weeks as well as orthopnea and PND which he has not had before. No abnormal edema.   Over the last 2-3 days, he reports feeling more weak. Yesterday, he was out mowing the lawn again when he developed diaphoresis and started to feel very weak so he came back inside and sat down. He tried to stand up again but felt "swimmy headed." He denies any recent chest pain and syncope. He was concerned that he may have the coronavirus so he went to his PCP's office. He denies any fevers, chills, or body aches. However, his son-in-law was diagnosed with COVID about 3 weeks ago. EKG was performed at PCP's yesterday  and patient states his heart rate was "jumping" around. He denies being aware of this and denies any palpitations. Per PCP, heart rate was in the 40's. PCP sent him to the ED for further evaluation.  He did not pass out  Upon arrival to the ED, EKG showed normal sinus rhythm with PVCs, RBBB, and LAFB. Heart rate on Pulse Ox read in the 30's, however, heart rate was in the 60's to 70's on telemetry. High-sensitivity troponin negative at 9 and 10. Chest x-ray showed no acute findings. WBC 5.6, Hgb 15.8, Plts 250. Na 136, K 4.4, Glucose 108, SCr 1.82. Mg 1.6. COVID testing negative. Patient was admitted for management of dehydration/AKI and further evaluation of near syncope.  At the time of this evaluation, patient states he is feeling much better. He did have a very brief episode of atypical chest pain this morning when he was feeling short of breath. He states he was trying to take deep breaths when suddenly he had about 3 seconds of sharp chest pain with deep inspiration. Only occurred that one time.   Patient has a significant smoking history (>55 years) but quit in 2016. He denies any alcohol use. He does have a history of heart disease with both his mother and brother requiring a heart valve replacement. Patient states his mother died from a heart attack while on the operating table having her heart valve replaced. She was in her low 40's at the time.  Of  note, patient had orthostatic vital signs taken this morning which came back positive: Orthostatic VS for the past 24 hrs:  BP- Lying Pulse- Lying BP- Sitting Pulse- Sitting BP- Standing at 0 minutes Pulse- Standing at 0 minutes  07/02/19 1015 - - - - 108/71 89  07/02/19 1014 - - 114/78 83 - -  07/02/19 1010 128/61 74 - - - -      Heart Pathway Score:     Past Medical History:  Diagnosis Date  . Arthritis   . Bifascicular bundle branch block   . 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 Coronary artery disease    takes Plavix and ASA daily  . GERD  (gastroesophageal reflux disease)    occasional and will take Tums if needed  . History of blood clots 2008   in chest  . History of kidney stones late 90's  . Hyperlipidemia    takes Simvastatin daily  . Hypertension    takes Maxzide daily  . Hypothyroidism    takes Synthroid daily  . Joint pain   . Joint swelling    left knee  . Pneumonia    at 78 years old, double    Past Surgical History:  Procedure Laterality Date  . CARDIAC CATHETERIZATION    . CHOLECYSTECTOMY N/A 06/06/2018   Procedure: LAPAROSCOPIC CHOLECYSTECTOMY WITH INTRAOPERATIVE CHOLANGIOGRAM;  Surgeon: Donnie Mesa, MD;  Location: [facility];  Service: General;  Laterality: N/A;  . CORONARY ANGIOPLASTY  05/21/2007   lad stent/taxus stent  . HEMORROIDECTOMY  late 1970's  . HERNIA REPAIR Right 1980's  . 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 TOTAL HIP ARTHROPLASTY Left 05/08/2015   Procedure: LEFT TOTAL HIP ARTHROPLASTY ANTERIOR APPROACH;  Surgeon: Gaynelle Arabian, MD;  Location: [facility];  Service: Orthopedics;  Laterality: Left;     Home Medications:  Prior to Admission medications   Medication Sig Start Date End Date Taking? Authorizing Provider  aspirin EC 81 MG tablet Take 1 tablet (81 mg total) by mouth daily. 04/02/18  Yes Nahser, Wonda Cheng, MD  clopidogrel (PLAVIX) 75 MG tablet Take 75 mg by mouth daily with breakfast.   Yes [provider]  levothyroxine (SYNTHROID, LEVOTHROID) 150 MCG tablet Take 150 mcg by mouth daily. 01/04/18  Yes [provider]  lisinopril (PRINIVIL,ZESTRIL) 20 MG tablet Take 20 mg by mouth daily.    Yes [provider]  loratadine (CLARITIN) 10 MG tablet Take 10 mg by mouth daily as needed for allergies.   Yes [provider]  nitroGLYCERIN (NITROSTAT) 0.4 MG SL tablet Place 1 tablet (0.4 mg total) under  the tongue every 5 (five) minutes as needed for chest pain. 04/02/18  Yes Nahser, Wonda Cheng, MD  simvastatin (ZOCOR) 40 MG tablet Take 40 mg by mouth every evening.   Yes [provider]    Inpatient Medications: Scheduled Meds: . aspirin EC  81 mg Oral Daily  . clopidogrel  75 mg Oral Q breakfast  . enoxaparin (LOVENOX) injection  40 mg Subcutaneous Q24H  . levothyroxine  150 mcg Oral Q0600  . simvastatin  40 mg Oral QPM   Continuous Infusions: . sodium chloride 50 mL/hr at 07/02/19 1131   PRN Meds: acetaminophen **OR** acetaminophen, loratadine, nitroGLYCERIN, ondansetron **OR** ondansetron (ZOFRAN) IV  Allergies:    Allergies  Allergen Reactions  . Oxycodone Nausea Only  . Robaxin [Methocarbamol]  Nausea Only  . Tramadol Nausea Only and Other (See Comments)    Hallucinations    Social History:   Social History   Socioeconomic History  . Marital status: Married    Spouse name: Not on file  . Number of children: Not on file  . Years of education: Not on file  . Highest education level: Not on file  Occupational History  . Not on file  Social Needs  . Financial resource strain: Not on file  . Food insecurity    Worry: Not on file    Inability: Not on file  . Transportation needs    Medical: Not on file    Non-medical: Not on file  Tobacco Use  . Smoking status: Current Every Day Smoker    Packs/day: 0.25    Years: 52.00    Pack years: 13.00    Types: Cigarettes  . Smokeless tobacco: Never Used  . Tobacco comment: trying to quit  Substance and Sexual Activity  . Alcohol use: No  . Drug use: No  . Sexual activity: Not on file  Lifestyle  . Physical activity    Days per week: Not on file    Minutes per session: Not on file  . Stress: Not on file  Relationships  . Social Musicianconnections    Talks on phone: Not on file    Gets together: Not on file    Attends religious service: Not on file    Active member of club or organization: Not on file     Attends meetings of clubs or organizations: Not on file    Relationship status: Not on file  . Intimate partner violence    Fear of current or ex partner: Not on file    Emotionally abused: Not on file    Physically abused: Not on file    Forced sexual activity: Not on file  Other Topics Concern  . Not on file  Social History Narrative  . Not on file    Family History:    Family History  Problem Relation Age of Onset  . CAD Mother   . Heart failure Mother   . Pneumonia Father   . Heart failure Father      ROS:  Please see the history of present illness.  Review of Systems  Constitutional: Positive for diaphoresis. Negative for chills and fever.  HENT: Positive for congestion (post-nasal drainage).   Eyes: Negative for blurred vision and double vision.  Respiratory: Positive for cough and shortness of breath.   Cardiovascular: Positive for chest pain and orthopnea. Negative for palpitations and leg swelling.  Gastrointestinal: Negative for abdominal pain, blood in stool, nausea and vomiting.  Genitourinary: Negative for hematuria.  Musculoskeletal: Negative for falls and myalgias.  Neurological: Positive for dizziness and weakness. Negative for loss of consciousness.  Endo/Heme/Allergies: Does not bruise/bleed easily.  Psychiatric/Behavioral: Negative for substance abuse (former tobacco abuse).   Physical Exam/Data:   Vitals:   07/02/19 0011 07/02/19 0346 07/02/19 0500 07/02/19 0725  BP: (!) 129/56 125/61  (!) 128/59  Pulse: (!) 32 (!) 33 60 (!) 134  Resp: 18 18  18   Temp: 97.8 F (36.6 C) 97.6 F (36.4 C)  97.8 F (36.6 C)  TempSrc: Oral Oral  Oral  SpO2: 96% 96%  97%  Weight:  90.8 kg    Height:        Intake/Output Summary (Last 24 hours) at 07/02/2019 1143 Last data filed at 07/02/2019 0936 Gross per 24 hour  Intake 625.09 ml  Output 851 ml  Net -225.91 ml   Last 3 Weights 07/02/2019 07/01/2019 03/25/2019  Weight (lbs) 200 lb 3.2 oz 201 lb 209 lb  Weight  (kg) 90.81 kg 91.173 kg 94.802 kg     Body mass index is 27.15 kg/m.   Physical Exam per MD:  General:  Elderly male resting comfortably in no acute distress.  HEENT: Normal. Lymph: No adenopathy. Neck: Supple.JVP is normal Endocrine:  No thyromegaly. Vascular: No carotid bruits. FA pulses 2+ bilaterally without bruits.  Cardiac: RRR. Distinct S1 and S2. No murmurs, gallops, or rubs.  Lungs: Clear to auscultation bilaterally. No wheezing, rhonchi, or rales. Abd: Abdomen, non-distended, and non-tender. Bowel sounds present. Ext: No edema. Musculoskeletal: No deformities. BUE and BLE strength normal and equal. Skin: Warm and dry. Neuro: Alert and oriented x3. No focal deficits. Psych:  Normal affect.   EKG:  The EKG was personally reviewed and demonstrates:   SR   91  Frequent PACs, occasional PVCs.   RBBB  LAFB Telemetry:  Telemetry was personally reviewed and demonstrates: SR with PACs  Relevant CV Studies:  Myoview 09/24/2013: No evidence of ischemia or scar. The left ventricular systolic function could not be assessed because the study was not gated.  Laboratory Data:  High Sensitivity Troponin:   Recent Labs  Lab 07/01/19 1418  TROPONINIHS 10  9     Cardiac EnzymesNo results for input(s): TROPONINI in the last 168 hours. No results for input(s): TROPIPOC in the last 168 hours.  Chemistry Recent Labs  Lab 07/01/19 1418 07/02/19 0430  NA 136 139  K 4.4 4.8  CL 104 110  CO2 22 24  GLUCOSE 108* 99  BUN 24* 22  CREATININE 1.82* 1.54*  CALCIUM 8.9 8.6*  GFRNONAA 35* 43*  GFRAA 41* 50*  ANIONGAP 10 5    No results for input(s): PROT, ALBUMIN, AST, ALT, ALKPHOS, BILITOT in the last 168 hours. Hematology Recent Labs  Lab 07/01/19 1418 07/02/19 0430  WBC 5.6 5.3  RBC 4.56 4.13*  HGB 15.8 14.3  HCT 48.1 43.0  MCV 105.5* 104.1*  MCH 34.6* 34.6*  MCHC 32.8 33.3  RDW 12.7 12.8  PLT 250 204   BNPNo results for input(s): BNP, PROBNP in the last 168 hours.   DDimer No results for input(s): DDIMER in the last 168 hours.   Radiology/Studies:  Dg Chest Port 1 View  Result Date: 07/01/2019 CLINICAL DATA:  Progressive shortness of breath. EXAM: PORTABLE CHEST 1 VIEW COMPARISON:  06/04/2018 FINDINGS: The heart size and mediastinal contours are within normal limits. Both lungs are clear. The visualized skeletal structures are unremarkable. IMPRESSION: No active disease. Electronically Signed   By: Francene BoyersJames  Maxwell M.D.   On: 07/01/2019 14:51    Assessment and Plan:   Near Syncope/ Orthostatic Hypotension - Patient presents with weakness and lightheadedness. - Telemetry shows frequent PACs, some PVCs  No pauses or signif bradycardia   - High-sensitivity troponin negative at 9 >> 10. - Echo pending. - Positive orthostatic vitals sounds earlier this morning with systolic BP dropping from 128 while laying down to 108 while standing.  BUN / Cr on admit are elevate    - OVerall  sounds like near synope was secondary to dehydration/orthostatic hypotension. Receiving IV fluids. REcomm: COntinue IV fluids CHeck UA Recheck orhtostatics AMbulate with tele  CAD - Patient s/p stenting of proximal/ostial LAD in 2008 and is on long term dual antiplatelet with Aspirin and Plavix due to thrombosis  with embolization down the circumflex artery.  - Stable. Patient had one isolated episode of sharp chest pain this morning with deep inspiration that lasted for about 3 seconds and then resolved on its own. Does not sound cardiac in nature. - Continue dual antiplatelet therapy. - Continue home Simvastatin 40mg  daily.  Dyspnea - Patient does report some dyspnea on exertion as well as orthopnea and PND over the last 2 weeks.  - Chest x-ray showed no acute findings. - Echo pending. Ambulate and recored O2 saturation  Rhythm   As above   COntinue tele   Has PACs, some PVCs   Make sure electrolytes OK   - Given bifascicular block and reports of heart rates in the 40's  at PCP's, would be hesitant to add beta-blocker/calcium channel blocker. Will defer to MD. WIll review echo   AKI - Serum creatinine 1.82 on admission. Improving with IV fluids. 1.54 this morning.  Hypomagnesemia - Magnesium 1.6 on admission. Repleted.   For questions or updates, please contact [facility] Please consult [email or web address] for contact info under     Signed, Corrin ParkerCallie E Goodrich, PA-C  07/02/2019 11:43 AM   Patient seen and examined   I have amended note above and performed physical exam since C Goodrich remote  Pt had been doing well until recently   NOw with dizziness.  And SOB     Orthostatics are positive     Echo pending  REcomm:  COntinue to hydrate Check UA Ambulate.   Check O2 sat    COntinue telemetry  WIll continue to follow.  Thomas PatesPaula Jaquala Fuller MD

## 2019-07-02 NOTE — Progress Notes (Signed)
  Echocardiogram 2D Echocardiogram has been performed.  Thomas [name] 07/02/2019, 11:30 AM

## 2019-07-02 NOTE — Progress Notes (Signed)
Ambulate patient in hall way, no dizziness, denies pain and shortness of breath. Will continue to monitor the patient.

## 2019-07-03 LAB — BASIC METABOLIC PANEL
Anion gap: 7 (ref 5–15)
BUN: 17 mg/dL (ref 8–23)
CO2: 24 mmol/L (ref 22–32)
Calcium: 8.5 mg/dL — ABNORMAL LOW (ref 8.9–10.3)
Chloride: 108 mmol/L (ref 98–111)
Creatinine, Ser: 1.5 mg/dL — ABNORMAL HIGH (ref 0.61–1.24)
GFR calc Af Amer: 51 mL/min — ABNORMAL LOW (ref >60)
GFR calc non Af Amer: 44 mL/min — ABNORMAL LOW (ref >60)
Glucose, Bld: 95 mg/dL (ref 70–99)
Potassium: 4.4 mmol/L (ref 3.5–5.1)
Sodium: 139 mmol/L (ref 135–145)

## 2019-07-03 LAB — URINALYSIS, ROUTINE W REFLEX MICROSCOPIC
Bilirubin Urine: NEGATIVE
Glucose, UA: NEGATIVE mg/dL
Hgb urine dipstick: NEGATIVE
Ketones, ur: 5 mg/dL — AB
Leukocytes,Ua: NEGATIVE
Nitrite: NEGATIVE
Protein, ur: NEGATIVE mg/dL
Specific Gravity, Urine: 1.011 (ref 1.005–1.030)
pH: 5 (ref 5.0–8.0)

## 2019-07-03 LAB — TSH: TSH: 1.415 u[IU]/mL (ref 0.350–4.500)

## 2019-07-03 NOTE — Progress Notes (Signed)
Progress Note  Patient Name: Thomas [name] Date of Encounter: 07/03/2019  Primary Cardiologist: Mertie Moores, MD   Subjective   Not dizzy   Breathing is OK    Inpatient Medications    Scheduled Meds: . aspirin EC  81 mg Oral Daily  . clopidogrel  75 mg Oral Q breakfast  . enoxaparin (LOVENOX) injection  40 mg Subcutaneous Q24H  . levothyroxine  150 mcg Oral Q0600  . simvastatin  40 mg Oral QPM   Continuous Infusions: . sodium chloride 50 mL/hr at 07/02/19 1800   PRN Meds: acetaminophen **OR** acetaminophen, loratadine, nitroGLYCERIN, ondansetron **OR** ondansetron (ZOFRAN) IV   Vital Signs    Vitals:   07/02/19 1204 07/02/19 1624 07/02/19 1925 07/03/19 0334  BP: (!) 142/56 (!) 129/91 135/67 135/64  Pulse: (!) 58 62 (!) 34 (!) 33  Resp: 18 18 18 18   Temp: 97.8 F (36.6 C) 97.6 F (36.4 C) 98.2 F (36.8 C) 97.7 F (36.5 C)  TempSrc: Oral Oral Oral Oral  SpO2: 98% 97% 97% 96%  Weight:    90.8 kg  Height:        Intake/Output Summary (Last 24 hours) at 07/03/2019 0635 Last data filed at 07/03/2019 0630 Gross per 24 hour  Intake 915.91 ml  Output 1151 ml  Net -235.09 ml   Last 3 Weights 07/03/2019 07/02/2019 07/01/2019  Weight (lbs) 200 lb 1.6 oz 200 lb 3.2 oz 201 lb  Weight (kg) 90.765 kg 90.81 kg 91.173 kg      Telemetry     SR with frequent PACs- Personally Reviewed  ECG      Physical Exam   GEN: No acute distress.   Neck: No JVD Cardiac: RRR, no murmurs, rubs, or gallops.  Respiratory: Clear to auscultation bilaterally. GI: Soft, nontender, non-distended  MS: No edema; No deformity. Neuro:  Nonfocal  Psych: Normal affect   Labs    High Sensitivity Troponin:   Recent Labs  Lab 07/01/19 1418  TROPONINIHS 10  9      Cardiac EnzymesNo results for input(s): TROPONINI in the last 168 hours. No results for input(s): TROPIPOC in the last 168 hours.   Chemistry Recent Labs  Lab 07/01/19 1418 07/02/19 0430  NA 136 139  K 4.4 4.8  CL  104 110  CO2 22 24  GLUCOSE 108* 99  BUN 24* 22  CREATININE 1.82* 1.54*  CALCIUM 8.9 8.6*  GFRNONAA 35* 43*  GFRAA 41* 50*  ANIONGAP 10 5     Hematology Recent Labs  Lab 07/01/19 1418 07/02/19 0430  WBC 5.6 5.3  RBC 4.56 4.13*  HGB 15.8 14.3  HCT 48.1 43.0  MCV 105.5* 104.1*  MCH 34.6* 34.6*  MCHC 32.8 33.3  RDW 12.7 12.8  PLT 250 204    BNPNo results for input(s): BNP, PROBNP in the last 168 hours.   DDimer No results for input(s): DDIMER in the last 168 hours.   Radiology    Dg Chest Port 1 View  Result Date: 07/01/2019 CLINICAL DATA:  Progressive shortness of breath. EXAM: PORTABLE CHEST 1 VIEW COMPARISON:  06/04/2018 FINDINGS: The heart size and mediastinal contours are within normal limits. Both lungs are clear. The visualized skeletal structures are unremarkable. IMPRESSION: No active disease. Electronically Signed   By: Lorriane Shire M.D.   On: 07/01/2019 14:51    Cardiac Studies   None this admit  Patient Profile     78 y.o. male with a history of CAD (s/p PTCA/stent prox/ostial  LAD in 2008 )on long term dual antiplatelet with Aspirin and Plavix due to thrombosis with embolization down the circumflex artery), hypertension, hyperlipidemia, and GERD, hypothyroidism, who is being seen today for the evaluation of near syncope at the request of Dr. Benjamine MolaVann.  Assessment & Plan    1  Dizziness/ Orthostatic hypotension  Pt symptomatically improved   Walked last night in hall and this am  Still orthostatic in middle of night. (BP 135/ down to 96/)  So,  I increased IV fluids   Will repeat this am He is now off if lisinopril   I would not restart Urine today was a little concentrated I have talked to him about increasing fluid intake, urine should be light WOuld repeat orthstatics today but should be OK to be d/c'd later today    Will need outpt follow up    2  CAD  No symptoms of angina.  3  Rhythm   Frequent PACs   No signif arrhythmia     THis is probably why  pulse is low (not picked up given ectopy)   For questions or updates, please contact [facility] Please consult [email or web address] for contact info under        Signed, Dietrich PatesPaula , MD  07/03/2019, 6:35 AM

## 2019-07-03 NOTE — Progress Notes (Signed)
PROGRESS NOTE  Thomas CoveyClifford H [name] QMV:[id number] DOB: 1941-10-21 DOA: 07/01/2019 PCP: Kaleen MaskElkins, Wilson Oliver, MD  HPI/Recap of past 24 hours: Thomas DuckingClifford [name]  is a 78 y.o. male, with past medical history significant for CAD, hypertension, hyperlipidemia, hypothyroidism and chronic kidney disease, presenting with multiple episodes of lightheadedness and near syncope for the last 2 days while working outside cutting trees with a chainsaw..  Patient denies any chest pains, shortness of breath, cough or sputum production.  He checked his temperature at home and he denies any fever or chills.  Upon further questioning this has been going on for few weeks but it got worse in the last 2 days.  Patient denies any loss of consciousness and he attributes it first to allergies.  He saw his primary medical doctor today who advised him to come to the emergency department after he had an EKG done. In the emergency room the patient was noted to be in mild AKI and his EKG showed bifascicular block with bigeminy's at 81 bpm.  Patient looked tired with bilateral conjunctival injection.  07/03/19: Patient was seen and examined at his bedside.  Denies dizziness or palpitations.  Positive orthostatic vital signs again this morning on IV fluids.  Concentrated urine noted this morning.  Cardiology saw the patient and recommended not to restart lisinopril.  We will continue IV fluid hydration and repeat orthostatic vital signs in the morning.    Assessment/Plan: Principal Problem:   Near syncope Active Problems:   CAD (coronary artery disease)   HTN (hypertension)   Hyperlipidemia   Acute kidney injury superimposed on chronic kidney disease (HCC)   Hypomagnesemia   Nonspecific abnormal electrocardiogram (ECG) (EKG)   Near syncope likely secondary to dehydration Positive orthostatic vital signs Continue IV fluid hydration Do not restart lisinopril as recommended by cardiology PT OT, no further outpatient  recommendations Fall precaution TSH unrevealing, 2D echo done on 07/02/2019 unrevealing  AKI on CKD 3, resolving Creatinine 1.54 from 1.82 Continue to avoid nephrotoxins Continue to monitor urine output Continue IV fluid hydration  Hypertension Normal tensive On lisinopril at home Hold lisinopril as recommended by cardiology  Hypothyroidism Continue Synthroid TSH unremarkable  Hyperlipidemia Continue statin  Hypomagnesemia Repleted  Coronary artery disease status post PCI with stent No ACS symptoms Continue Plavix aspirin and statin     Code Status: full Family Communication: Thomas [name] (daughter) on phone Disposition Plan: home when ready  DVT prophylaxis: Lovenox daily  Consultants:  cardmaster  Procedures:  echo  Antibiotics:     Objective: Vitals:   07/02/19 1204 07/02/19 1624 07/02/19 1925 07/03/19 0334  BP: (!) 142/56 (!) 129/91 135/67 135/64  Pulse: (!) 58 62 (!) 34 (!) 33  Resp: 18 18 18 18   Temp: 97.8 F (36.6 C) 97.6 F (36.4 C) 98.2 F (36.8 C) 97.7 F (36.5 C)  TempSrc: Oral Oral Oral Oral  SpO2: 98% 97% 97% 96%  Weight:    90.8 kg  Height:        Intake/Output Summary (Last 24 hours) at 07/03/2019 1155 Last data filed at 07/03/2019 1026 Gross per 24 hour  Intake 1732.85 ml  Output 1200 ml  Net 532.85 ml   Filed Weights   07/01/19 1426 07/02/19 0346 07/03/19 0334  Weight: 91.2 kg 90.8 kg 90.8 kg    Exam:  . General: 78 y.o. year-old male well developed well nourished in no acute distress.  Alert and oriented x3. . Cardiovascular: Regular rate and rhythm with no rubs or gallops.  No thyromegaly or JVD noted.   [name] Respiratory: Mild rales at bases no wheezes noted.  Poor inspiratory effort. . Abdomen: Soft nontender nondistended with normal bowel sounds x4 quadrants. . Musculoskeletal: Trace lower extremity edema. 2/4 pulses in all 4 extremities. [name] Psychiatry: Mood is appropriate for condition and setting   Data Reviewed:  CBC: Recent Labs  Lab 07/01/19 1418 07/02/19 0430  WBC 5.6 5.3  HGB 15.8 14.3  HCT 48.1 43.0  MCV 105.5* 104.1*  PLT 250 397   Basic Metabolic Panel: Recent Labs  Lab 07/01/19 1418 07/02/19 0430 07/03/19 0324  NA 136 139 139  K 4.4 4.8 4.4  CL 104 110 108  CO2 22 24 24   GLUCOSE 108* 99 95  BUN 24* 22 17  CREATININE 1.82* 1.54* 1.50*  CALCIUM 8.9 8.6* 8.5*  MG 1.6*  --   --    GFR: Estimated Creatinine Clearance: 45.3 mL/min (A) (by C-G formula based on SCr of 1.5 mg/dL (H)). Liver Function Tests: No results for input(s): AST, ALT, ALKPHOS, BILITOT, PROT, ALBUMIN in the last 168 hours. No results for input(s): LIPASE, AMYLASE in the last 168 hours. No results for input(s): AMMONIA in the last 168 hours. Coagulation Profile: No results for input(s): INR, PROTIME in the last 168 hours. Cardiac Enzymes: No results for input(s): CKTOTAL, CKMB, CKMBINDEX, TROPONINI in the last 168 hours. BNP (last 3 results) No results for input(s): PROBNP in the last 8760 hours. HbA1C: No results for input(s): HGBA1C in the last 72 hours. CBG: No results for input(s): GLUCAP in the last 168 hours. Lipid Profile: No results for input(s): CHOL, HDL, LDLCALC, TRIG, CHOLHDL, LDLDIRECT in the last 72 hours. Thyroid Function Tests: Recent Labs    07/03/19 0323  TSH 1.415   Anemia Panel: No results for input(s): VITAMINB12, FOLATE, FERRITIN, TIBC, IRON, RETICCTPCT in the last 72 hours. Urine analysis:    Component Value Date/Time   COLORURINE YELLOW 07/03/2019 0654   APPEARANCEUR CLEAR 07/03/2019 0654   LABSPEC 1.011 07/03/2019 0654   PHURINE 5.0 07/03/2019 0654   GLUCOSEU NEGATIVE 07/03/2019 0654   HGBUR NEGATIVE 07/03/2019 0654   BILIRUBINUR NEGATIVE 07/03/2019 0654   KETONESUR 5 (A) 07/03/2019 0654   PROTEINUR NEGATIVE 07/03/2019 0654   UROBILINOGEN 0.2 04/27/2015 1033   NITRITE NEGATIVE 07/03/2019 0654   LEUKOCYTESUR NEGATIVE 07/03/2019 0654   Sepsis Labs:  @[email or web address] (procalcitonin:4,lacticidven:4)  ) Recent Results (from the past 240 hour(s))  SARS Coronavirus 2 (CEPHEID- Performed in [place] hospital lab), Hosp Order     Status: None   Collection Time: 07/01/19  2:59 PM   Specimen: Nasopharyngeal Swab  Result Value Ref Range Status   SARS Coronavirus 2 NEGATIVE NEGATIVE Final    Comment: (NOTE) If result is NEGATIVE SARS-CoV-2 target nucleic acids are NOT DETECTED. The SARS-CoV-2 RNA is generally detectable in upper and lower  respiratory specimens during the acute phase of infection. The lowest  concentration of SARS-CoV-2 viral copies this assay can detect is 250  copies / mL. A negative result does not preclude SARS-CoV-2 infection  and should not be used as the sole basis for treatment or other  patient management decisions.  A negative result may occur with  improper specimen collection / handling, submission of specimen other  than nasopharyngeal swab, presence of viral mutation(s) within the  areas targeted by this assay, and inadequate number of viral copies  (<250 copies / mL). A negative result must be combined with clinical  observations, patient history, and epidemiological  information. If result is POSITIVE SARS-CoV-2 target nucleic acids are DETECTED. The SARS-CoV-2 RNA is generally detectable in upper and lower  respiratory specimens dur ing the acute phase of infection.  Positive  results are indicative of active infection with SARS-CoV-2.  Clinical  correlation with patient history and other diagnostic information is  necessary to determine patient infection status.  Positive results do  not rule out bacterial infection or co-infection with other viruses. If result is PRESUMPTIVE POSTIVE SARS-CoV-2 nucleic acids MAY BE PRESENT.   A presumptive positive result was obtained on the submitted specimen  and confirmed on repeat testing.  While 2019 novel coronavirus  (SARS-CoV-2) nucleic acids may be present in the  submitted sample  additional confirmatory testing may be necessary for epidemiological  and / or clinical management purposes  to differentiate between  SARS-CoV-2 and other Sarbecovirus currently known to infect humans.  If clinically indicated additional testing with an alternate test  methodology [id number]) is advised. The SARS-CoV-2 RNA is generally  detectable in upper and lower respiratory sp ecimens during the acute  phase of infection. The expected result is Negative. Fact Sheet for Patients:  BoilerBrush.com.cyhttps://www.fda.gov/media/136312/download Fact Sheet for Healthcare Providers: https://pope.com/https://www.fda.gov/media/136313/download This test is not yet approved or cleared by the [place] FDA and has been authorized for detection and/or diagnosis of SARS-CoV-2 by FDA under an Emergency Use Authorization (EUA).  This EUA will remain in effect (meaning this test can be used) for the duration of the COVID-19 declaration under Section 564(b)(1) of the Act, 21 U.S.C. section 360bbb-3(b)(1), unless the authorization is terminated or revoked sooner. Performed at [facility], [address] [place], [place] [address]       Studies: No results found.  Scheduled Meds: . aspirin EC  81 mg Oral Daily  . clopidogrel  75 mg Oral Q breakfast  . enoxaparin (LOVENOX) injection  40 mg Subcutaneous Q24H  . levothyroxine  150 mcg Oral Q0600  . simvastatin  40 mg Oral QPM    Continuous Infusions: . sodium chloride 100 mL/hr (07/03/19 0659)     LOS: 1 day     Darlin Droparole N Tamula Morrical, MD [facility] Pager [phone]  If 7PM-7AM, please contact night-coverage [email or web address] Password [email or web address] 07/03/2019, 11:55 AM

## 2019-07-03 NOTE — Plan of Care (Signed)
  Problem: Activity: Goal: Risk for activity intolerance will decrease Outcome: Progressing   Problem: Pain Managment: Goal: General experience of comfort will improve Outcome: Progressing   

## 2019-07-03 NOTE — Evaluation (Signed)
Physical Therapy Evaluation Patient Details Name: Thomas [name] MRN: [id number] DOB: 14-Aug-1941 Today's Date: 07/03/2019   History of Present Illness  Thomas [name] is a 78 y.o. male with a history of CAD (s/p PTCA/stent prox/ostial LAD in 2008 )on long term dual antiplatelet with Aspirin and Plavix due to thrombosis with embolization down the circumflex artery), hypertension, hyperlipidemia, and GERD, hypothyroidism, who is being seen today for the evaluation of near syncope   Clinical Impression  Patient evaluated by Physical Therapy with no further acute PT needs identified. All education has been completed and the patient has no further questions. Pt is mod I for ambulation and supervision for stair training. Pt does not need PT services or equipment at d/c. PT is signing off. Thank you for this referral.     Follow Up Recommendations No PT follow up    Equipment Recommendations  None recommended by PT    Recommendations for Other Services       Precautions / Restrictions Precautions Precautions: None Restrictions Weight Bearing Restrictions: No      Mobility  Bed Mobility Overal bed mobility: Independent             General bed mobility comments: No physical assist needed  Transfers Overall transfer level: Modified independent               General transfer comment: increase time to self steady but no outside assist required  Ambulation/Gait Ambulation/Gait assistance: Modified independent (Device/Increase time) Gait Distance (Feet): 450 Feet Assistive device: None Gait Pattern/deviations: Step-through pattern;Drifts right/left Gait velocity: WFl Gait velocity interpretation: >2.62 ft/sec, indicative of community ambulatory General Gait Details: pt ambulates at a good pace with minor drifting in the hallway, no LoB and able to avoid obstacles without cuing.   Stairs Stairs: Yes Stairs assistance: Supervision Stair Management: One rail  Left;Forwards;Alternating pattern;Step to pattern Number of Stairs: 4 General stair comments: pt ascends steps with use of rail and step over step pattern, and descends steps with step to pattern, strong steady movements  Wheelchair Mobility    Modified Rankin (Stroke Patients Only)       Balance Overall balance assessment: Mild deficits observed, not formally tested                                           Pertinent Vitals/Pain Pain Assessment: No/denies pain    Home Living Family/patient expects to be discharged to:: Private residence Living Arrangements: Spouse/significant other Available Help at Discharge: Family;Available 24 hours/day Type of Home: House Home Access: Stairs to enter Entrance Stairs-Rails: Can reach both Entrance Stairs-Number of Steps: 3 Home Layout: One level Home Equipment: Walker - 2 wheels Additional Comments: Discussed additional AE pt could potentially use for energy conservation    Prior Function Level of Independence: Independent         Comments: Pt active, doing yard work and chores around the house     [employer]   Dominant Hand: Right    Extremity/Trunk Assessment   Upper Extremity Assessment Upper Extremity Assessment: Defer to OT evaluation    Lower Extremity Assessment Lower Extremity Assessment: Overall WFL for tasks assessed    Cervical / Trunk Assessment Cervical / Trunk Assessment: Normal(Min cueing for upright posture)  Communication   Communication: No difficulties  Cognition Arousal/Alertness: Awake/alert Behavior During Therapy: WFL for tasks assessed/performed Overall Cognitive Status: Within Functional Limits  for tasks assessed                                 General Comments: Polite and cooperative      General Comments General comments (skin integrity, edema, etc.):  VSS        Assessment/Plan    PT Assessment Patent does not need any further PT services          PT Goals (Current goals can be found in the Care Plan section)  Acute Rehab PT Goals Patient Stated Goal: Get back home PT Goal Formulation: With patient     AM-PAC PT "6 Clicks" Mobility  Outcome Measure Help needed turning from your back to your side while in a flat bed without using bedrails?: None Help needed moving from lying on your back to sitting on the side of a flat bed without using bedrails?: None Help needed moving to and from a bed to a chair (including a wheelchair)?: None Help needed standing up from a chair using your arms (e.g., wheelchair or bedside chair)?: None Help needed to walk in hospital room?: None Help needed climbing 3-5 steps with a railing? : None 6 Click Score: 24    End of Session Equipment Utilized During Treatment: Gait belt Activity Tolerance: Patient tolerated treatment well Patient left: in chair;with call bell/phone within reach Nurse Communication: Mobility status      Time: [id number] PT Time Calculation (min) (ACUTE ONLY): 11 min   Charges:   PT Evaluation $PT Eval Moderate Complexity: 1 Mod          Denni France B. Beverely RisenVan Fleet PT, DPT Acute Rehabilitation Services Pager [phone] Office [phone]   Elon Alaslizabeth B Van Fleet 07/03/2019, 11:22 AM

## 2019-07-03 NOTE — Progress Notes (Signed)
Updated the patient's daughter via phone at [phone]. All questions answered to her satisfaction.

## 2019-07-03 NOTE — Plan of Care (Signed)
  Problem: Clinical Measurements: Goal: Will remain free from infection Outcome: Progressing   Problem: Safety: Goal: Ability to remain free from injury will improve Outcome: Progressing   

## 2019-07-03 NOTE — Evaluation (Signed)
Occupational Therapy Evaluation and Discharge Patient Details Name: Thomas [name] MRN: [id number] DOB: 02-Jan-1941 Today's Date: 07/03/2019    History of Present Illness Thomas [name] is a 78 y.o. male with a history of CAD (s/p PTCA/stent prox/ostial LAD in 2008 )on long term dual antiplatelet with Aspirin and Plavix due to thrombosis with embolization down the circumflex artery), hypertension, hyperlipidemia, and GERD, hypothyroidism, who is being seen today for the evaluation of near syncope    Clinical Impression   Pt presents to OT at baseline levels with ADL and transfer performance. Pt active PTA and required min cueing for activity pacing during session. Reviewed energy conservation and fall risk reduction. Pt requires no further OT needs acutely.     Follow Up Recommendations  No OT follow up    Equipment Recommendations  None recommended by OT       Precautions / Restrictions Precautions Precautions: None Restrictions Weight Bearing Restrictions: No      Mobility Bed Mobility Overal bed mobility: Independent             General bed mobility comments: No physical assist needed  Transfers Overall transfer level: Modified independent               General transfer comment: No AE required, slight postural sway at times but [facility] LtdWFL for age     Balance Overall balance assessment: Mild deficits observed, not formally tested                                         ADL either performed or assessed with clinical judgement   ADL Overall ADL's : At baseline;Modified independent                                       General ADL Comments: Pt able to complete ADL transfers at modified independent level, able to complete LB dressing sit <> stand     Vision Baseline Vision/History: Wears glasses Wears Glasses: At all times Patient Visual Report: No change from baseline Vision Assessment?: No apparent visual deficits      Perception Perception Perception Tested?: Yes(WFL)   Praxis Praxis Praxis tested?: Within functional limits    Pertinent Vitals/Pain Pain Assessment: No/denies pain     Hand Dominance Right   Extremity/Trunk Assessment Upper Extremity Assessment Upper Extremity Assessment: Overall WFL for tasks assessed   Lower Extremity Assessment Lower Extremity Assessment: Defer to PT evaluation   Cervical / Trunk Assessment Cervical / Trunk Assessment: Normal(Min cueing for upright posture)   Communication Communication Communication: No difficulties   Cognition Arousal/Alertness: Awake/alert Behavior During Therapy: WFL for tasks assessed/performed Overall Cognitive Status: Within Functional Limits for tasks assessed                                 General Comments: Polite and cooperative   General Comments  Mild cueing for activity pacing, suspect this can be attributed to pt personality            Home Living Family/patient expects to be discharged to:: Private residence Living Arrangements: Spouse/significant other Available Help at Discharge: Family;Available 24 hours/day Type of Home: House Home Access: Stairs to enter [employer] CorporationEntrance Stairs-Number of Steps: 3 Entrance Stairs-Rails: Can reach both Home Layout:  One level     Bathroom Shower/Tub: Teacher, early years/pre: Standard     Home Equipment: Environmental consultant - 2 wheels   Additional Comments: Discussed additional AE pt could potentially use for energy conservation      Prior Functioning/Environment Level of Independence: Independent        Comments: Pt active, doing yard work and chores around the house        OT Problem List: Impaired balance (sitting and/or standing)         OT Goals(Current goals can be found in the care plan section) Acute Rehab OT Goals Patient Stated Goal: Get back home OT Goal Formulation: All assessment and education complete, DC therapy      AM-PAC OT "6  Clicks" Daily Activity     Outcome Measure Help from another person eating meals?: None Help from another person taking care of personal grooming?: None Help from another person toileting, which includes using toliet, bedpan, or urinal?: None Help from another person bathing (including washing, rinsing, drying)?: None Help from another person to put on and taking off regular upper body clothing?: None Help from another person to put on and taking off regular lower body clothing?: None 6 Click Score: 24   End of Session Nurse Communication: Mobility status  Activity Tolerance: Patient tolerated treatment well Patient left: in bed;with call bell/phone within reach;with nursing/sitter in room  OT Visit Diagnosis: Unsteadiness on feet (R26.81)                Time: 8088-1103 OT Time Calculation (min): 11 min Charges:  OT General Charges $OT Visit: 1 Visit OT Evaluation $OT Eval Low Complexity: 1 Low  Curtis Sites OTR/L  07/03/2019, 8:36 AM

## 2019-07-04 NOTE — Progress Notes (Signed)
Patient alert and oriented, denies pain, iv and tele remove, d/c instruction explain and given to the patient, all questions answered, patient verbalized understanding. Patient d/c home per MD order.

## 2019-07-04 NOTE — Discharge Summary (Signed)
Discharge Summary  Thomas [name] ATF:[id number] DOB: 1941-04-11  PCP: Leonard Downing, MD  Admit date: 07/01/2019 Discharge date: 07/04/2019  Time spent: 35 minutes  Recommendations for Outpatient Follow-up:  1. Follow-up with your cardiologist 2. Follow-up with your primary care provider 3. Take your medications as prescribed 4. Fall precautions  Discharge Diagnoses:  Active Hospital Problems   Diagnosis Date Noted  . Near syncope 07/01/2019  . Hypomagnesemia 07/02/2019  . Nonspecific abnormal electrocardiogram (ECG) (EKG) 07/02/2019  . Acute kidney injury superimposed on chronic kidney disease ([facility]) 06/04/2018  . Hyperlipidemia 05/13/2011  . HTN (hypertension) 05/13/2011  . CAD (coronary artery disease) 05/13/2011    Resolved Hospital Problems  No resolved problems to display.    Discharge Condition: Stable  Diet recommendation: Resume previous diet  Vitals:   07/03/19 1940 07/04/19 0552  BP: (!) 148/98 (!) 123/100  Pulse: 69 (!) 51  Resp: 17 18  Temp: 97.9 F (36.6 C) (!) 97.5 F (36.4 C)  SpO2: 98% 98%    History of present illness:  Thomas [name] a78 y.o.male,with past medical history significant for CAD, hypertension, hyperlipidemia, hypothyroidism and chronic kidney disease stage III, presenting with multiple episodes of lightheadedness and near syncope for the last 2 days while working outside cutting trees with a chainsaw. Patient denies any chest pain, shortness of breath, cough or sputum production. He checked his temperature at home and he denies any fever or chills. Upon further questioning this has been going on for few weeks but it got worse in the last 2 days. Patient denies any loss of consciousness and he attributes it first to allergies. He saw his primary medical doctor today who advised him to come to the emergency department after he had an EKG done. In the emergency room the patient was noted to be in mild AKI and his EKG  showed bifascicular block with bigeminy's at 81 bpm. Patient looked tired with bilateral conjunctival injection.  Cardiology consulted and followed.  Started on IV fluid hydration with improvement of symptomatology.  Cardiology recommended not to restart lisinopril.    07/04/19: Patient was seen and examined at his bedside.  He denies dizziness.  He has been able to ambulate this morning without any weakness or dizziness.  He has no new complaints and wants to go home.  On the day of discharge, the patient was hemodynamically stable.  He will need to follow-up with his cardiologist Dr. Acie Fredrickson and his primary care provider posthospitalization.  Patient understands and agrees to plan.  Hospital Course:  Principal Problem:   Near syncope Active Problems:   CAD (coronary artery disease)   HTN (hypertension)   Hyperlipidemia   Acute kidney injury superimposed on chronic kidney disease (HCC)   Hypomagnesemia   Nonspecific abnormal electrocardiogram (ECG) (EKG)  Near syncope likely secondary to intravascular fluid depletion/dehydration Positive orthostatic vital signs. Patient was advised to avoid dehydration Received IV fluid hydration Cardiology recommended not to restart lisinopril. No further recommendations from PT OT TSH normal and 2D echo unrevealing  Resolving AKI on CKD 3 Creatinine 1.50 on 07/03/2019 from 1.54 from 1.82 Creatinine appears to be close to baseline 1.33 with GFR 50 Continue to avoid nephrotoxins Avoid dehydration Follow-up with your primary care provider  Orthostatic hypotension Positive orthostatic vital signs Blood pressure is currently normotensive Avoid dehydration  On lisinopril at home, hold as recommended by cardiology. Follow-up with your primary care provider within a week.  Hypothyroidism Continue Synthroid TSH normal  Hyperlipidemia Last LDL 54 on  04/02/2018 Continue statin  Hypomagnesemia Repleted  Coronary artery disease status  post PCI with stent No angina symptoms Continue Plavix aspirin and statin     Code Status:full Family Communication:wendy (daughter) on phone on 07/03/2019.   Consultants:  Cardiology  Procedures: 2D echo done on 07/02/2019: Left Ventricle: The left ventricle has normal systolic function, with an ejection fraction of 60-65%. The cavity size was normal. 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  Antibiotics:  None   Discharge Exam: BP (!) 123/100 (BP Location: Right Arm)   Pulse (!) 51   Temp (!) 97.5 F (36.4 C) (Oral)   Resp 18   Ht 6' (1.829 m)   Wt 91.4 kg Comment: scale a  SpO2 98%   BMI 27.31 kg/m  . General: 78 y.o. year-old male well developed well nourished in no acute distress.  Alert and oriented x3. . Cardiovascular: Regular rate and rhythm with no rubs or gallops.  No thyromegaly or JVD noted.   [name]. Respiratory: Clear to auscultation with no wheezes or rales. Good inspiratory effort. . Abdomen: Soft nontender nondistended with normal bowel sounds x4 quadrants. . Musculoskeletal: No lower extremity edema. 2/4 pulses in all 4 extremities. [name]. Psychiatry: Mood is appropriate for condition and setting  Discharge Instructions You were cared for by a hospitalist during your hospital stay. If you have any questions about your discharge medications or the care you received while you were in the hospital after you are discharged, you can call the unit and asked to speak with the hospitalist on call if the hospitalist that took care of you is not available. Once you are discharged, your primary care physician will handle any further medical issues. Please note that NO REFILLS for any discharge medications will be authorized once you are discharged, as it is imperative that you return to your primary care physician (or establish a relationship with a primary care physician if you do not have one) for your aftercare needs so that they can reassess your  need for medications and monitor your lab values.   Allergies as of 07/04/2019      Reactions   Oxycodone Nausea Only   Robaxin [methocarbamol] Nausea Only   Tramadol Nausea Only, Other (See Comments)   Hallucinations      Medication List    STOP taking these medications   lisinopril 20 MG tablet Commonly known as: ZESTRIL     TAKE these medications   aspirin EC 81 MG tablet Take 1 tablet (81 mg total) by mouth daily.   clopidogrel 75 MG tablet Commonly known as: PLAVIX Take 75 mg by mouth daily with breakfast.   levothyroxine 150 MCG tablet Commonly known as: SYNTHROID Take 150 mcg by mouth daily.   loratadine 10 MG tablet Commonly known as: CLARITIN Take 10 mg by mouth daily as needed for allergies.   nitroGLYCERIN 0.4 MG SL tablet Commonly known as: NITROSTAT Place 1 tablet (0.4 mg total) under the tongue every 5 (five) minutes as needed for chest pain.   simvastatin 40 MG tablet Commonly known as: ZOCOR Take 40 mg by mouth every evening.      Allergies  Allergen Reactions  . Oxycodone Nausea Only  . Robaxin [Methocarbamol] Nausea Only  . Tramadol Nausea Only and Other (See Comments)    Hallucinations   Follow-up Information    [name] NewcomerWeaver, Scott T, PA-C Follow up on 08/07/2019.   Specialties: Cardiology, Physician Assistant Why: 2:45 pm hospital follow up Contact information: [address]  [address] [place] [address] [phone]        Kaleen MaskElkins, Wilson Oliver, MD. Call in 1 day(s).   Specialty: Family Medicine Why: Please call for a post hospital follow-up appointment within a week. Contact information: [address] [place] [address] [phone]        Nahser, Deloris PingPhilip J, MD .   Specialty: Cardiology Contact information: [address] [place] [address] [phone]        Dominica SeverinGramig, William, MD. Call in 1 day(s).   Specialty: Orthopedic Surgery Why: Please call for a post hospital follow-up  appointment. Contact information: [address] [place] [address] [phone]            The results of significant diagnostics from this hospitalization (including imaging, microbiology, ancillary and laboratory) are listed below for reference.    Significant Diagnostic Studies: Dg Chest Port 1 View  Result Date: 07/01/2019 CLINICAL DATA:  Progressive shortness of breath. EXAM: PORTABLE CHEST 1 VIEW COMPARISON:  06/04/2018 FINDINGS: The heart size and mediastinal contours are within normal limits. Both lungs are clear. The visualized skeletal structures are unremarkable. IMPRESSION: No active disease. Electronically Signed   By: Francene BoyersJames  Maxwell M.D.   On: 07/01/2019 14:51    Microbiology: Recent Results (from the past 240 hour(s))  SARS Coronavirus 2 (CEPHEID- Performed in [facility] hospital lab), Hosp Order     Status: None   Collection Time: 07/01/19  2:59 PM   Specimen: Nasopharyngeal Swab  Result Value Ref Range Status   SARS Coronavirus 2 NEGATIVE NEGATIVE Final    Comment: (NOTE) If result is NEGATIVE SARS-CoV-2 target nucleic acids are NOT DETECTED. The SARS-CoV-2 RNA is generally detectable in upper and lower  respiratory specimens during the acute phase of infection. The lowest  concentration of SARS-CoV-2 viral copies this assay can detect is 250  copies / mL. A negative result does not preclude SARS-CoV-2 infection  and should not be used as the sole basis for treatment or other  patient management decisions.  A negative result may occur with  improper specimen collection / handling, submission of specimen other  than nasopharyngeal swab, presence of viral mutation(s) within the  areas targeted by this assay, and inadequate number of viral copies  (<250 copies / mL). A negative result must be combined with clinical  observations, patient history, and epidemiological information. If result is POSITIVE SARS-CoV-2 target nucleic acids are  DETECTED. The SARS-CoV-2 RNA is generally detectable in upper and lower  respiratory specimens dur ing the acute phase of infection.  Positive  results are indicative of active infection with SARS-CoV-2.  Clinical  correlation with patient history and other diagnostic information is  necessary to determine patient infection status.  Positive results do  not rule out bacterial infection or co-infection with other viruses. If result is PRESUMPTIVE POSTIVE SARS-CoV-2 nucleic acids MAY BE PRESENT.   A presumptive positive result was obtained on the submitted specimen  and confirmed on repeat testing.  While 2019 novel coronavirus  (SARS-CoV-2) nucleic acids may be present in the submitted sample  additional confirmatory testing may be necessary for epidemiological  and / or clinical management purposes  to differentiate between  SARS-CoV-2 and other Sarbecovirus currently known to infect humans.  If clinically indicated additional testing with an alternate test  methodology [phone]([id number]) is advised. The SARS-CoV-2 RNA is generally  detectable in upper and lower respiratory sp ecimens during the acute  phase of infection. The expected  result is Negative. Fact Sheet for Patients:  BoilerBrush.com.cyhttps://www.fda.gov/media/136312/download Fact Sheet for Healthcare Providers: https://pope.com/https://www.fda.gov/media/136313/download This test is not yet approved or cleared by the [place] FDA and has been authorized for detection and/or diagnosis of SARS-CoV-2 by FDA under an Emergency Use Authorization (EUA).  This EUA will remain in effect (meaning this test can be used) for the duration of the COVID-19 declaration under Section 564(b)(1) of the Act, 21 U.S.C. section 360bbb-3(b)(1), unless the authorization is terminated or revoked sooner. Performed at [facility], [address] [place], [place] [address]      Labs: Basic Metabolic Panel: Recent Labs  Lab 07/01/19 1418 07/02/19 0430 07/03/19 0324   NA 136 139 139  K 4.4 4.8 4.4  CL 104 110 108  CO2 22 24 24   GLUCOSE 108* 99 95  BUN 24* 22 17  CREATININE 1.82* 1.54* 1.50*  CALCIUM 8.9 8.6* 8.5*  MG 1.6*  --   --    Liver Function Tests: No results for input(s): AST, ALT, ALKPHOS, BILITOT, PROT, ALBUMIN in the last 168 hours. No results for input(s): LIPASE, AMYLASE in the last 168 hours. No results for input(s): AMMONIA in the last 168 hours. CBC: Recent Labs  Lab 07/01/19 1418 07/02/19 0430  WBC 5.6 5.3  HGB 15.8 14.3  HCT 48.1 43.0  MCV 105.5* 104.1*  PLT 250 204   Cardiac Enzymes: No results for input(s): CKTOTAL, CKMB, CKMBINDEX, TROPONINI in the last 168 hours. BNP: BNP (last 3 results) No results for input(s): BNP in the last 8760 hours.  ProBNP (last 3 results) No results for input(s): PROBNP in the last 8760 hours.  CBG: No results for input(s): GLUCAP in the last 168 hours.     Signed:  Darlin Droparole N Sherhonda Gaspar, MD [facility] 07/04/2019, 9:00 AM

## 2019-07-04 NOTE — Discharge Instructions (Signed)
Orthostatic Hypotension °Blood pressure is a measurement of how strongly, or weakly, your blood is pressing against the walls of your arteries. Orthostatic hypotension is a sudden drop in blood pressure that happens when you quickly change positions, such as when you get up from sitting or lying down. °Arteries are blood vessels that carry blood from your heart throughout your body. When blood pressure is too low, you may not get enough blood to your brain or to the rest of your organs. This can cause weakness, light-headedness, rapid heartbeat, and fainting. This can last for just a few seconds or for up to a few minutes. Orthostatic hypotension is usually not a serious problem. However, if it happens frequently or gets worse, it may be a sign of something more serious. °What are the causes? °This condition may be caused by: °· Sudden changes in posture, such as standing up quickly after you have been sitting or lying down. °· Blood loss. °· Loss of body fluids (dehydration). °· Heart problems. °· Hormone (endocrine) problems. °· Pregnancy. °· Severe infection. °· Lack of certain nutrients. °· Severe allergic reactions (anaphylaxis). °· Certain medicines, such as blood pressure medicine or medicines that make the body lose excess fluids (diuretics). Sometimes, this condition can be caused by not taking medicine as directed, such as taking too much of a certain medicine. °What increases the risk? °The following factors may make you more likely to develop this condition: °· Age. Risk increases as you get older. °· Conditions that affect the heart or the central nervous system. °· Taking certain medicines, such as blood pressure medicine or diuretics. °· Being pregnant. °What are the signs or symptoms? °Symptoms of this condition may include: °· Weakness. °· Light-headedness. °· Dizziness. °· Blurred vision. °· Fatigue. °· Rapid heartbeat. °· Fainting, in severe cases. °How is this diagnosed? °This condition is  diagnosed based on: °· Your medical history. °· Your symptoms. °· Your blood pressure measurement. Your health care provider will check your blood pressure when you are: °? Lying down. °? Sitting. °? Standing. °A blood pressure reading is recorded as two numbers, such as "120 over 80" (or 120/80). The first ("top") number is called the systolic pressure. It is a measure of the pressure in your arteries as your heart beats. The second ("bottom") number is called the diastolic pressure. It is a measure of the pressure in your arteries when your heart relaxes between beats. Blood pressure is measured in a unit called mm Hg. Healthy blood pressure for most adults is 120/80. If your blood pressure is below 90/60, you may be diagnosed with hypotension. °Other information or tests that may be used to diagnose orthostatic hypotension include: °· Your other vital signs, such as your heart rate and temperature. °· Blood tests. °· Tilt table test. For this test, you will be safely secured to a table that moves you from a lying position to an upright position. Your heart rhythm and blood pressure will be monitored during the test. °How is this treated? °This condition may be treated by: °· Changing your diet. This may involve eating more salt (sodium) or drinking more water. °· Taking medicines to raise your blood pressure. °· Changing the dosage of certain medicines you are taking that might be lowering your blood pressure. °· Wearing compression stockings. These stockings help to prevent blood clots and reduce swelling in your legs. °In some cases, you may need to go to the hospital for: °· Fluid replacement. This means you will   receive fluids through an IV.  Blood replacement. This means you will receive donated blood through an IV (transfusion).  Treating an infection or heart problems, if this applies.  Monitoring. You may need to be monitored while medicines that you are taking wear off. Follow these instructions  at home: Eating and drinking   Drink enough fluid to keep your urine pale yellow.  Eat a healthy diet, and follow instructions from your health care provider about eating or drinking restrictions. A healthy diet includes: ? Fresh fruits and vegetables. ? Whole grains. ? Lean meats. ? Low-fat dairy products.  Eat extra salt only as directed. Do not add extra salt to your diet unless your health care provider told you to do that.  Eat frequent, small meals.  Avoid standing up suddenly after eating. Medicines  Take over-the-counter and prescription medicines only as told by your health care provider. ? Follow instructions from your health care provider about changing the dosage of your current medicines, if this applies. ? Do not stop or adjust any of your medicines on your own. General instructions   Wear compression stockings as told by your health care provider.  Get up slowly from lying down or sitting positions. This gives your blood pressure a chance to adjust.  Avoid hot showers and excessive heat as directed by your health care provider.  Return to your normal activities as told by your health care provider. Ask your health care provider what activities are safe for you.  Do not use any products that contain nicotine or tobacco, such as cigarettes, e-cigarettes, and chewing tobacco. If you need help quitting, ask your health care provider.  Keep all follow-up visits as told by your health care provider. This is important. Contact a health care provider if you:  Vomit.  Have diarrhea.  Have a fever for more than 2-3 days.  Feel more thirsty than usual.  Feel weak and tired. Get help right away if you:  Have chest pain.  Have a fast or irregular heartbeat.  Develop numbness in any part of your body.  Cannot move your arms or your legs.  Have trouble speaking.  Become sweaty or feel light-headed.  Faint.  Feel short of breath.  Have trouble staying  awake.  Feel confused. Summary  Orthostatic hypotension is a sudden drop in blood pressure that happens when you quickly change positions.  Orthostatic hypotension is usually not a serious problem.  It is diagnosed by having your blood pressure taken lying down, sitting, and then standing.  It may be treated by changing your diet or adjusting your medicines. This information is not intended to replace advice given to you by your health care provider. Make sure you discuss any questions you have with your health care provider. Document Released: 11/18/2002 Document Revised: 05/24/2018 Document Reviewed: 05/24/2018 Elsevier Patient Education  [place]. Dehydration, Adult  Dehydration is when there is not enough fluid or water in your body. This happens when you lose more fluids than you take in. Dehydration can range from mild to very bad. It should be treated right away to keep it from getting very bad. Symptoms of mild dehydration may include:  Thirst.  Dry lips.  Slightly dry mouth.  Dry, warm skin.  Dizziness. Symptoms of moderate dehydration may include:  Very dry mouth.  Muscle cramps.  Dark pee (urine). Pee may be the color of tea.  Your body making less pee.  Your eyes making fewer tears.  Heartbeat  that is uneven or faster than normal (palpitations).  Headache.  Light-headedness, especially when you stand up from sitting.  Fainting (syncope). Symptoms of very bad dehydration may include:  Changes in skin, such as: ? Cold and clammy skin. ? Blotchy (mottled) or pale skin. ? Skin that does not quickly return to normal after being lightly pinched and let go (poor skin turgor).  Changes in body fluids, such as: ? Feeling very thirsty. ? Your eyes making fewer tears. ? Not sweating when body temperature is high, such as in hot weather. ? Your body making very little pee.  Changes in vital signs, such as: ? Weak pulse. ? Pulse that is more  than 100 beats a minute when you are sitting still. ? Fast breathing. ? Low blood pressure.  Other changes, such as: ? Sunken eyes. ? Cold hands and feet. ? Confusion. ? Lack of energy (lethargy). ? Trouble waking up from sleep. ? Short-term weight loss. ? Unconsciousness. Follow these instructions at home:   If told by your doctor, drink an ORS: ? Make an ORS by using instructions on the package. ? Start by drinking small amounts, about  cup (120 mL) every 5-10 minutes. ? Slowly drink more until you have had the amount that your doctor said to have.  Drink enough clear fluid to keep your pee clear or pale yellow. If you were told to drink an ORS, finish the ORS first, then start slowly drinking clear fluids. Drink fluids such as: ? Water. Do not drink only water by itself. Doing that can make the salt (sodium) level in your body get too low (hyponatremia). ? Ice chips. ? Fruit juice that you have added water to (diluted). ? Low-calorie sports drinks.  Avoid: ? Alcohol. ? Drinks that have a lot of sugar. These include high-calorie sports drinks, fruit juice that does not have water added, and soda. ? Caffeine. ? Foods that are greasy or have a lot of fat or sugar.  Take over-the-counter and prescription medicines only as told by your doctor.  Do not take salt tablets. Doing that can make the salt level in your body get too high (hypernatremia).  Eat foods that have minerals (electrolytes). Examples include bananas, oranges, potatoes, tomatoes, and spinach.  Keep all follow-up visits as told by your doctor. This is important. Contact a doctor if:  You have belly (abdominal) pain that: ? Gets worse. ? Stays in one area (localizes).  You have a rash.  You have a stiff neck.  You get angry or annoyed more easily than normal (irritability).  You are more sleepy than normal.  You have a harder time waking up than normal.  You feel: ? Weak. ? Dizzy. ? Very  thirsty.  You have peed (urinated) only a small amount of very dark pee during 6-8 hours. Get help right away if:  You have symptoms of very bad dehydration.  You cannot drink fluids without throwing up (vomiting).  Your symptoms get worse with treatment.  You have a fever.  You have a very bad headache.  You are throwing up or having watery poop (diarrhea) and it: ? Gets worse. ? Does not go away.  You have blood or something green (bile) in your throw-up.  You have blood in your poop (stool). This may cause poop to look black and tarry.  You have not peed in 6-8 hours.  You pass out (faint).  Your heart rate when you are sitting still is more than  100 beats a minute.  You have trouble breathing. This information is not intended to replace advice given to you by your health care provider. Make sure you discuss any questions you have with your health care provider. Document Released: 09/24/2009 Document Revised: 11/10/2017 Document Reviewed: 01/22/2016 Elsevier Patient Education  2020 [employer]

## 2019-07-04 NOTE — Progress Notes (Signed)
Progress Note  Patient Name: Thomas [name] Date of Encounter: 07/04/2019  Primary Cardiologist: Mertie Moores, MD   Subjective   No dizziness  No CP  NO SOB  Inpatient Medications    Scheduled Meds: . aspirin EC  81 mg Oral Daily  . clopidogrel  75 mg Oral Q breakfast  . enoxaparin (LOVENOX) injection  40 mg Subcutaneous Q24H  . levothyroxine  150 mcg Oral Q0600  . simvastatin  40 mg Oral QPM   Continuous Infusions:  PRN Meds: acetaminophen **OR** acetaminophen, loratadine, nitroGLYCERIN, ondansetron **OR** ondansetron (ZOFRAN) IV   Vital Signs    Vitals:   07/02/19 1925 07/03/19 0334 07/03/19 1940 07/04/19 0552  BP: 135/67 135/64 (!) 148/98 (!) 123/100  Pulse: (!) 34 (!) 33 69 (!) 51  Resp: 18 18 17 18   Temp: 98.2 F (36.8 C) 97.7 F (36.5 C) 97.9 F (36.6 C) (!) 97.5 F (36.4 C)  TempSrc: Oral Oral Oral Oral  SpO2: 97% 96% 98% 98%  Weight:  90.8 kg  91.4 kg  Height:        Intake/Output Summary (Last 24 hours) at 07/04/2019 0734 Last data filed at 07/04/2019 0612 Gross per 24 hour  Intake 2320 ml  Output 2300 ml  Net 20 ml   Last 3 Weights 07/04/2019 07/03/2019 07/02/2019  Weight (lbs) 201 lb 6.4 oz 200 lb 1.6 oz 200 lb 3.2 oz  Weight (kg) 91.354 kg 90.765 kg 90.81 kg      Telemetry     SR with frequent PACs- Personally Reviewed  ECG      Physical Exam   GEN: No acute distress.   Neck: No JVD Cardiac: RRR, no murmurs, rubs, or gallops.  Respiratory: Clear to auscultation bilaterally. GI: Soft, nontender, non-distended  MS: No edema; No deformity. Neuro:  Nonfocal  Psych: Normal affect   Labs    High Sensitivity Troponin:   Recent Labs  Lab 07/01/19 1418  TROPONINIHS 10  9      Cardiac EnzymesNo results for input(s): TROPONINI in the last 168 hours. No results for input(s): TROPIPOC in the last 168 hours.   Chemistry Recent Labs  Lab 07/01/19 1418 07/02/19 0430 07/03/19 0324  NA 136 139 139  K 4.4 4.8 4.4  CL 104 110 108   CO2 22 24 24   GLUCOSE 108* 99 95  BUN 24* 22 17  CREATININE 1.82* 1.54* 1.50*  CALCIUM 8.9 8.6* 8.5*  GFRNONAA 35* 43* 44*  GFRAA 41* 50* 51*  ANIONGAP 10 5 7      Hematology Recent Labs  Lab 07/01/19 1418 07/02/19 0430  WBC 5.6 5.3  RBC 4.56 4.13*  HGB 15.8 14.3  HCT 48.1 43.0  MCV 105.5* 104.1*  MCH 34.6* 34.6*  MCHC 32.8 33.3  RDW 12.7 12.8  PLT 250 204    BNPNo results for input(s): BNP, PROBNP in the last 168 hours.   DDimer No results for input(s): DDIMER in the last 168 hours.   Radiology    No results found.  Cardiac Studies   None this admit  Patient Profile     78 y.o. male with a history of CAD (s/p PTCA/stent prox/ostial LAD in 2008 )on long term dual antiplatelet with Aspirin and Plavix due to thrombosis with embolization down the circumflex artery), hypertension, hyperlipidemia, and GERD, hypothyroidism, who is being seen today for the evaluation of near syncope at the request of Dr. Eliseo Squires.  Assessment & Plan    1  Dizziness/ Orthostatic hypotension  Patient has been getting IV fluids   Has not been dizzy since admission    BUT orthostatics are still positive.  )130/55  P 70   SItting  120/67  P72   Standing 110/67   P 80   I have instructed him to increase fluid and salt intake He is off antihypertensive Avoid prolonged periods in heat where he could get dehdrated I do not think he needs any meds to boost BP since it is above 100 and he is not dizzy   WIll follow as outpt    OK to d/c today   WIll make sure he has f/u  2  CAD  No symptoms of angina.  3  Rhythm   Frequent PACs   No signif arrhythmia     THis is probably why pulse is low (not picked up given ectopy)   For questions or updates, please contact [facility] Please consult [email or web address] for contact info under        Signed, Dietrich PatesPaula Horrace Hanak, MD  07/04/2019, 7:34 AM

## 2019-07-15 ENCOUNTER — Telehealth: Payer: Self-pay

## 2019-07-15 NOTE — Telephone Encounter (Signed)

## 2019-07-15 NOTE — Progress Notes (Signed)
Cardiology Office Note:    Date:  07/16/2019   ID:  Thomas [name], DOB [birth year], MRN [id number]  PCP:  Kaleen MaskElkins, Wilson Oliver, MD  Cardiologist:  Kristeen MissPhilip Nahser, MD  Electrophysiologist:  None   Referring MD: Kaleen MaskElkins, Wilson Oliver, *   Chief Complaint  Patient presents with  . Hospitalization Follow-up    admx with hypotension, AKI 2/2 dehydration    History of Present Illness:    Thomas [name] is a 78 y.o. 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  He was admitted 7/20-7/23 with near syncope.  He was noted to have orthostatic hypotension and AKI.  His ACE inhibitor was held and he was given IVFs.  He returns for follow-up.  He is here alone.  Since discharge, he has felt well.  He denies any further dizziness.  He has not had chest pain, shortness of breath, syncope, orthopnea, lower extremity swelling.  Blood pressures since discharge have been optimal.  Prior CV studies:   The following studies were reviewed today:  Echocardiogram 07/02/2019 EF 60-65, mod asymmetric LVH, no RWMA, mild-mod LAE, trivial AI   Myoview 09/24/13 No ischemia; not gated  Cardiac catheterization 05/2007 LM irregs LAD irregs, stent patent LCx probable thrombus at proximal stent RCA irregs Normal LVEF   Past Medical History:  Diagnosis Date  . Arthritis   . Bifascicular bundle branch block   . 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 Coronary artery disease    takes Plavix and ASA daily  . GERD (gastroesophageal reflux disease)    occasional and will take Tums if needed  . History of blood clots 2008   in chest  . History of kidney stones late 90's  . Hyperlipidemia    takes Simvastatin daily  . Hypertension    takes Maxzide daily   . Hypothyroidism    takes Synthroid daily  . Joint pain   . Joint swelling    left knee  . Pneumonia    at 78 years old, double   Surgical Hx: The patient  has a past surgical history that includes Cardiac catheterization; Hemorroidectomy (late 1970's); Hernia repair (Right, 1980's); Total hip arthroplasty (Right, 10/28/2013); Coronary angioplasty (05/21/2007); Total hip arthroplasty (Left, 05/08/2015); and Cholecystectomy (N/A, 06/06/2018).   Current Medications: Current Meds  Medication Sig  . aspirin EC 81 MG tablet Take 1 tablet (81 mg total) by mouth daily.  . clopidogrel (PLAVIX) 75 MG tablet Take 75 mg by mouth daily with breakfast.  . levothyroxine (SYNTHROID, LEVOTHROID) 150 MCG tablet Take 150 mcg by mouth daily.  [name]. loratadine (CLARITIN) 10 MG tablet Take 10 mg by mouth daily as needed for allergies.  . nitroGLYCERIN (NITROSTAT) 0.4 MG SL tablet Place 1 tablet (0.4 mg total) under the tongue every 5 (five) minutes as needed for chest pain.  . simvastatin (ZOCOR) 40 MG tablet Take 40 mg by mouth every evening.     Allergies:   Oxycodone, Robaxin [methocarbamol], and Tramadol   Social History   Tobacco Use  . Smoking status: Former Smoker    Packs/day: 0.25    Years: 52.00    Pack years: 13.00    Types: Cigarettes    Quit date: 2016    Years  since quitting: 4.5  . Smokeless tobacco: Never Used  . Tobacco comment: trying to quit  Substance Use Topics  . Alcohol use: No  . Drug use: No     Family Hx: The patient's family history includes CAD in his mother; Heart failure in his father and mother; Pneumonia in his father.  ROS:   Please see the history of present illness.    ROS All other systems reviewed and are negative.   EKGs/Labs/Other Test Reviewed:    EKG:  EKG is  ordered today.  The ekg ordered today demonstrates sinus rhythm, heart rate 71, left anterior fascicular block, right bundle branch block, frequent PACs, QTC 478, similar to prior tracings   Recent Labs: 07/01/2019: Magnesium 1.6 07/02/2019: Hemoglobin 14.3; Platelets 204 07/03/2019: TSH 1.415 07/16/2019: BUN 10; Creatinine, Ser 1.41; Potassium 3.8; Sodium 143   Recent Lipid Panel Lab Results  Component Value Date/Time   CHOL 109 04/02/2018 09:19 AM   TRIG 61 06/04/2018 11:57 PM   HDL 35 (L) 04/02/2018 09:19 AM   CHOLHDL 3.1 04/02/2018 09:19 AM   CHOLHDL 4.1 02/29/2016 08:39 AM   LDLCALC 54 04/02/2018 09:19 AM    Physical Exam:    VS:  BP 138/80   Pulse 71   Ht 6' (1.829 m)   Wt 206 lb (93.4 kg)   SpO2 98%   BMI 27.94 kg/m     Wt Readings from Last 3 Encounters:  07/16/19 206 lb (93.4 kg)  07/04/19 201 lb 6.4 oz (91.4 kg)  03/25/19 209 lb (94.8 kg)     Physical Exam  Constitutional: He is oriented to person, place, and time. He appears well-developed and well-nourished. No distress.  HENT:  Head: Normocephalic and atraumatic.  Eyes: No scleral icterus.  Neck: No JVD present. No thyromegaly present.  Cardiovascular: Normal rate, regular rhythm and normal heart sounds.  No murmur heard. Pulmonary/Chest: Effort normal and breath sounds normal. He has no rales.  Abdominal: Soft. There is no hepatomegaly.  Musculoskeletal:        General: No edema.  Lymphadenopathy:    He has no cervical adenopathy.  Neurological: He is alert and oriented to person, place, and time.  Skin: Skin is warm and dry.  Psychiatric: He has a normal mood and affect.    ASSESSMENT & PLAN:    1. Near syncope This was related to dehydration.  He did have mild acute kidney injury.  His blood pressure is optimal off of lisinopril.  Continue current therapy.  Repeat BMET today.  2. Coronary artery disease involving native coronary artery of native heart without angina pectoris History of Taxus DES to the ostial LAD.  He had stent thrombosis and is on lifelong dual antiplatelet therapy.  He has not had angina.  Continue aspirin, Plavix, statin.  3. Essential hypertension Blood  pressure optimal.  Continue current therapy.  Obtain follow-up BMP today  4. Mixed hyperlipidemia Continue statin therapy.   Dispo:  Return in about 8 months (around 03/15/2020) for Routine Follow Up, w/ Dr. Elease HashimotoNahser, or [name] NewcomerScott Weaver, PA-C.   Medication Adjustments/Labs and Tests Ordered: Current medicines are reviewed at length with the patient today.  Concerns regarding medicines are outlined above.  Tests Ordered: Orders Placed This Encounter  Procedures  . Basic metabolic panel  . Basic Metabolic Panel (BMET)  . EKG 12-Lead  . EKG 12-Lead   Medication Changes: No orders of the defined types were placed in this encounter.   Signed, [name] NewcomerScott Weaver, PA-C  07/16/2019 [facility] [address], [place], [place]  [address] Phone: [phone]; Fax: [phone]

## 2019-07-16 ENCOUNTER — Other Ambulatory Visit: Payer: Self-pay

## 2019-07-16 ENCOUNTER — Ambulatory Visit (INDEPENDENT_AMBULATORY_CARE_PROVIDER_SITE_OTHER): Payer: Medicare Other | Admitting: Physician Assistant

## 2019-07-16 ENCOUNTER — Encounter: Payer: Self-pay | Admitting: Physician Assistant

## 2019-07-16 VITALS — BP 138/80 | HR 71 | Ht 72.0 in | Wt 206.0 lb

## 2019-07-16 DIAGNOSIS — R55 Syncope and collapse: Secondary | ICD-10-CM | POA: Diagnosis not present

## 2019-07-16 DIAGNOSIS — E782 Mixed hyperlipidemia: Secondary | ICD-10-CM | POA: Diagnosis not present

## 2019-07-16 DIAGNOSIS — I10 Essential (primary) hypertension: Secondary | ICD-10-CM

## 2019-07-16 DIAGNOSIS — I2510 Atherosclerotic heart disease of native coronary artery without angina pectoris: Secondary | ICD-10-CM

## 2019-07-16 LAB — BASIC METABOLIC PANEL
BUN/Creatinine Ratio: 7 — ABNORMAL LOW (ref 10–24)
BUN: 10 mg/dL (ref 8–27)
CO2: 22 mmol/L (ref 20–29)
Calcium: 8.9 mg/dL (ref 8.6–10.2)
Chloride: 104 mmol/L (ref 96–106)
Creatinine, Ser: 1.41 mg/dL — ABNORMAL HIGH (ref 0.76–1.27)
GFR calc Af Amer: 55 mL/min/{1.73_m2} — ABNORMAL LOW (ref >59)
GFR calc non Af Amer: 48 mL/min/{1.73_m2} — ABNORMAL LOW (ref >59)
Glucose: 100 mg/dL — ABNORMAL HIGH (ref 65–99)
Potassium: 3.8 mmol/L (ref 3.5–5.2)
Sodium: 143 mmol/L (ref 134–144)

## 2019-07-16 NOTE — Patient Instructions (Signed)
Medication Instructions:  No changes  If you need a refill on your cardiac medications before your next appointment, please call your pharmacy.   Lab work: Today - BMET  If you have labs (blood work) drawn today and your tests are completely normal, you will receive your results only by: [name] MyChart Message (if you have MyChart) OR . A paper copy in the mail If you have any lab test that is abnormal or we need to change your treatment, we will call you to review the results.  Testing/Procedures: None   Follow-Up: At [facility], you and your health needs are our priority.  As part of our continuing mission to provide you with exceptional heart care, we have created designated Provider Care Teams.  These Care Teams include your primary Cardiologist (physician) and Advanced Practice Providers (APPs -  Physician Assistants and Nurse Practitioners) who all work together to provide you with the care you need, when you need it. You will need a follow up appointment in:  April 2021.  Please call our office 2 months in advance to schedule this appointment.  You may see Mertie Moores, MD or Richardson Dopp, PA-C  Any Other Special Instructions Will Be Listed Below (If Applicable). Keep an eye on your blood pressure Call if your blood pressure is 140/90 or higher

## 2019-08-07 ENCOUNTER — Ambulatory Visit: Payer: Medicare Other | Admitting: Physician Assistant

## 2019-09-13 ENCOUNTER — Other Ambulatory Visit: Payer: Self-pay

## 2019-09-13 ENCOUNTER — Encounter (INDEPENDENT_AMBULATORY_CARE_PROVIDER_SITE_OTHER): Payer: Self-pay

## 2019-09-13 ENCOUNTER — Encounter: Payer: Self-pay | Admitting: Nurse Practitioner

## 2019-09-13 ENCOUNTER — Ambulatory Visit: Payer: Medicare Other | Admitting: Cardiovascular Disease

## 2019-09-13 ENCOUNTER — Encounter: Payer: Self-pay | Admitting: Cardiovascular Disease

## 2019-09-13 VITALS — BP 138/68 | HR 51 | Ht 72.0 in | Wt 213.8 lb

## 2019-09-13 DIAGNOSIS — I493 Ventricular premature depolarization: Secondary | ICD-10-CM

## 2019-09-13 DIAGNOSIS — I2510 Atherosclerotic heart disease of native coronary artery without angina pectoris: Secondary | ICD-10-CM | POA: Diagnosis not present

## 2019-09-13 DIAGNOSIS — I10 Essential (primary) hypertension: Secondary | ICD-10-CM

## 2019-09-13 NOTE — Progress Notes (Signed)
Cardiology Office Note   Date:  09/13/2019   ID:  Thomas [name], Thomas [name] 08/27/1941, MRN [id number]  PCP:  Kaleen Mask, MD  Cardiologist:   Kristeen Miss, MD   No chief complaint on file.  Problem list: 1. Coronary artery disease-has a stent in his left anterior descending artery. The proximal edge of the stent hangs out into the left main. He's had thrombus formation on the proximal edge of the stent that embolized down the left circumflex artery when he was off Plavix. Because of this we have kept him on Plavix lifelong   2. Essential hypertension -   3. Hyperlipidemia-   Thomas [name] is a 78 year old gentleman with a history of coronary artery disease. He is status post PTCA and stenting of his left anterior descending artery. He's had thrombus formation on the proximal edge of the stent which extended out over the left circumflex artery. He's been on Plavix since that time.  He complains of some hip pain. He still smokes a few cigarettes a day.  February 26, 2014:  Since I last saw him 1 1/2 years ago, he has had hip surgery . I had recommneded that he not stop his plavix. Dr. Despina Hick opperated on him ON plavix and had no significant bleeding episodes.   He has not had any CP   February 26, 2015 Thomas [name] is a 78 y.o. male who presents for pre op visit for left hip replacement.  He had his last hip replacment WHILE on full dose Plavix and had no bleeding .  He has no CP or dyspnea.  He is limited by his hip problems.   February 29, 2016:  Doing well.   Had another hip replacement in May .  Had the surgery while on Plavix . No CP Stays active.  Walks a mile a day   February 28, 2017:  Thomas [name] is seen back today for f/u of his CAD No CP or dyspnea.  Exercising regularly   April 02, 2018:  Thomas [name] is seen today for follow up of his CAD , hypertension, hyperlipidemia. Walks some,  2 miles a day - perhaps twice a week ,  No CP o r dyspnea.   No dizziness.  He  is on aspirin 325 mg a day.  We will have him decrease his dose to 81 mg a day. Also refill his nitroglycerin.   Oct. 2, 2020   Thomas [name] is seen today for follow-up of his coronary artery disease, hypertension and hyperlipidemia.  HR has been slow for the past month or so. Has been in bigeminy. ( so his reported slow HR is actually bigeminy PVCs)    has had shortness of breath - more dyspnea over the past month  Echo shows normal LV systolic function with EF 60-65% .   Could not assess diastolic function   He has CAD - his symptoms were just dyspnea.   Has never had any angina .   Was recently admitted for near syncope thought to be  volume depletion    Past Medical History:  Diagnosis Date  . Arthritis   . Bifascicular bundle branch block   . 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 Coronary artery disease    takes Plavix and ASA daily  . GERD (gastroesophageal reflux disease)    occasional and will take Tums if needed  . History of blood clots 2008   in chest  . History of kidney stones late 90's  . Hyperlipidemia  takes Simvastatin daily  . Hypertension    takes Maxzide daily  . Hypothyroidism    takes Synthroid daily  . Joint pain   . Joint swelling    left knee  . Pneumonia    at 78 years old, double    Past Surgical History:  Procedure Laterality Date  . CARDIAC CATHETERIZATION    . CHOLECYSTECTOMY N/A 06/06/2018   Procedure: LAPAROSCOPIC CHOLECYSTECTOMY WITH INTRAOPERATIVE CHOLANGIOGRAM;  Surgeon: Manus Ruddsuei, Matthew, MD;  [facility];  Service: General;  Laterality: N/A;  . CORONARY ANGIOPLASTY  05/21/2007   lad stent/taxus stent  . HEMORROIDECTOMY  late 1970's  . HERNIA REPAIR Right 1980's  . TOTAL HIP ARTHROPLASTY Right 10/28/2013   Procedure: RIGHT TOTAL HIP ARTHROPLASTY ANTERIOR APPROACH;  Surgeon: Loanne DrillingFrank V Aluisio, MD;  Location: [facility] ORS;  Service: Orthopedics;  Laterality: Right;  . TOTAL HIP ARTHROPLASTY Left 05/08/2015   Procedure: LEFT TOTAL HIP ARTHROPLASTY ANTERIOR  APPROACH;  Surgeon: Ollen GrossFrank Aluisio, MD;  Location: [facility] OR;  Service: Orthopedics;  Laterality: Left;     Current Outpatient Medications  Medication Sig Dispense Refill  . aspirin EC 81 MG tablet Take 1 tablet (81 mg total) by mouth daily.    . clopidogrel (PLAVIX) 75 MG tablet Take 75 mg by mouth daily with breakfast.    . levothyroxine (SYNTHROID, LEVOTHROID) 150 MCG tablet Take 150 mcg by mouth daily.    [name]. lisinopril (ZESTRIL) 20 MG tablet Take 10 mg by mouth daily.    [name]. loratadine (CLARITIN) 10 MG tablet Take 10 mg by mouth daily as needed for allergies.    . nitroGLYCERIN (NITROSTAT) 0.4 MG SL tablet Place 1 tablet (0.4 mg total) under the tongue every 5 (five) minutes as needed for chest pain. 25 tablet 6  . simvastatin (ZOCOR) 40 MG tablet Take 40 mg by mouth every evening.     No current facility-administered medications for this visit.     Allergies:   Oxycodone, Robaxin [methocarbamol], and Tramadol    Social History:  The patient  reports that he quit smoking about 4 years ago. His smoking use included cigarettes. He has a 13.00 pack-year smoking history. He has never used smokeless tobacco. He reports that he does not drink alcohol or use drugs.   Family History:  The patient's family history includes CAD in his mother; Heart failure in his father and mother; Pneumonia in his father.    ROS:  Noted in current history, otherwise review of systems is negative.   Physical Exam: Blood pressure 138/68, pulse (!) 51, height 6' (1.829 m), weight 213 lb 12.8 oz (97 kg), SpO2 96 %.  GEN: elderly male,  NAD  HEENT: Normal NECK: No JVD; No carotid bruits LYMPHATICS: No lymphadenopathy CARDIAC: RRR  With frequent premature ventricular contractions in a bigeminal pattern. RESPIRATORY:  Clear to auscultation without rales, wheezing or rhonchi  ABDOMEN: Soft, non-tender, non-distended MUSCULOSKELETAL:  No edema; No deformity  SKIN: Warm and dry NEUROLOGIC:  Alert and oriented x 3    EKG:   September 13, 2019: Normal sinus rhythm with frequent premature ventricular contractions in a pattern of bigeminy.  Right bundle branch block  Recent Labs: 07/01/2019: Magnesium 1.6 07/02/2019: Hemoglobin 14.3; Platelets 204 07/03/2019: TSH 1.415 07/16/2019: BUN 10; Creatinine, Ser 1.41; Potassium 3.8; Sodium 143    Lipid Panel    Component Value Date/Time   CHOL 109 04/02/2018 0919   TRIG 61 06/04/2018 2357   HDL 35 (L) 04/02/2018 0919   CHOLHDL 3.1 04/02/2018  0919   CHOLHDL 4.1 02/29/2016 0839   VLDL 36 (H) 02/29/2016 0839   LDLCALC 54 04/02/2018 0919      Wt Readings from Last 3 Encounters:  09/13/19 213 lb 12.8 oz (97 kg)  07/16/19 206 lb (93.4 kg)  07/04/19 201 lb 6.4 oz (91.4 kg)      Other studies Reviewed: Additional studies/ records that were reviewed today include: . Review of the above records demonstrates:    ASSESSMENT AND PLAN:  1. Coronary artery disease-has a stent in his left anterior descending artery. The proximal edge of the stent hangs out into the left main. He's had thrombus formation on the proximal edge of the stent that embolized down the left circumflex artery when he was off Plavix. Because of this we have kept him on Plavix lifelong   He has had 2 hip replacements while on Plavix and did very well.   He should remain on Plavix lifelong .   Is difficult to tell whether or not he is having any ischemia.  He did not have chest pain according to him with his first episode of angina.  He states that these episodes are different.  We will schedule him for a stress Myoview study for further evaluation.  2. Bigeminy :   Has frequent bigeminy .    Check labs including basic metabolic profile, TSH.  Will refer him to EP for further evaluation of his frequent ventricular ectopy.  2. Essential hypertension -blood pressures fairly well controlled.   3. Hyperlipidemia continue current medications.  4. Osteoarthritis   Current medicines are reviewed  at length with the patient today.  The patient does not have concerns regarding medicines.  The following changes have been made:  no change  Labs/ tests ordered today include:   Orders Placed This Encounter  Procedures  . Basic Metabolic Panel (BMET)  . TSH  . Ambulatory referral to Cardiac Electrophysiology  . Myocardial Perfusion Imaging     Disposition:   FU with me in 3 months     Signed, Mertie Moores, MD  09/13/2019 12:46 PM    [address], [place], [place]  [address] Phone: [phone]; Fax: [phone]

## 2019-09-13 NOTE — Patient Instructions (Addendum)
Medication Instructions:  Your physician recommends that you continue on your current medications as directed. Please refer to the Current Medication list given to you today.  If you need a refill on your cardiac medications before your next appointment, please call your pharmacy.   Lab work: TODAY - TSH, BMET If you have labs (blood work) drawn today and your tests are completely normal, you will receive your results only by: [name] MyChart Message (if you have MyChart) OR . A paper copy in the mail If you have any lab test that is abnormal or we need to change your treatment, we will call you to review the results.   Testing/Procedures: Your physician has requested that you have a lexiscan myoview. For further information please visit [email or web address]. Please follow instruction sheet, as given.    Follow-Up: You have been referred to Electrophysiology for management of your ventricular ectopy   At [facility], you and your health needs are our priority.  As part of our continuing mission to provide you with exceptional heart care, we have created designated Provider Care Teams.  These Care Teams include your primary Cardiologist (physician) and Advanced Practice Providers (APPs -  Physician Assistants and Nurse Practitioners) who all work together to provide you with the care you need, when you need it. You will need a follow up appointment in:  3 months. You may see Mertie Moores, MD or one of the following Advanced Practice Providers on your designated Care Team: Richardson Dopp, PA-C [place], [place] . Daune Perch, NP

## 2019-09-14 LAB — BASIC METABOLIC PANEL
BUN/Creatinine Ratio: 9 — ABNORMAL LOW (ref 10–24)
BUN: 14 mg/dL (ref 8–27)
CO2: 24 mmol/L (ref 20–29)
Calcium: 9.4 mg/dL (ref 8.6–10.2)
Chloride: 103 mmol/L (ref 96–106)
Creatinine, Ser: 1.58 mg/dL — ABNORMAL HIGH (ref 0.76–1.27)
GFR calc Af Amer: 48 mL/min/{1.73_m2} — ABNORMAL LOW (ref >59)
GFR calc non Af Amer: 42 mL/min/{1.73_m2} — ABNORMAL LOW (ref >59)
Glucose: 96 mg/dL (ref 65–99)
Potassium: 3.9 mmol/L (ref 3.5–5.2)
Sodium: 141 mmol/L (ref 134–144)

## 2019-09-14 LAB — TSH: TSH: 1.8 u[IU]/mL (ref 0.450–4.500)

## 2019-09-16 ENCOUNTER — Telehealth: Payer: Self-pay | Admitting: Cardiovascular Disease

## 2019-09-16 NOTE — Telephone Encounter (Signed)
Spoke with patient and advised him of the plan of care per Dr. Acie Fredrickson from his office visit on 10/2. I apologized for not making the plan clear while he was in the office. Patient verbalized understanding and agreement and is aware to keep appointment for lexiscan myovue on Friday 10/9. I answered patient's questions to his satisfaction and he was grateful for the call.

## 2019-09-16 NOTE — Telephone Encounter (Signed)
New message   Pt scheduled for EP appt with Dr. Lovena Le on 10.30.20. Pt would like a call from nurse. He said he was not informed of being referred to EP. Please call.

## 2019-09-17 ENCOUNTER — Telehealth (HOSPITAL_COMMUNITY): Payer: Self-pay | Admitting: *Deleted

## 2019-09-17 NOTE — Telephone Encounter (Signed)
Patient given detailed instructions per Myocardial Perfusion Study Information Sheet for the test on 09/20/2019 at 8:00. Patient notified to arrive 15 minutes early and that it is imperative to arrive on time for appointment to keep from having the test rescheduled.  If you need to cancel or reschedule your appointment, please call the office within 24 hours of your appointment. . Patient verbalized understanding.Thomas [name]

## 2019-09-20 ENCOUNTER — Other Ambulatory Visit: Payer: Self-pay

## 2019-09-20 ENCOUNTER — Ambulatory Visit (HOSPITAL_COMMUNITY): Payer: Medicare Other | Attending: Cardiovascular Disease

## 2019-09-20 DIAGNOSIS — I10 Essential (primary) hypertension: Secondary | ICD-10-CM | POA: Diagnosis present

## 2019-09-20 DIAGNOSIS — I493 Ventricular premature depolarization: Secondary | ICD-10-CM

## 2019-09-20 DIAGNOSIS — I2510 Atherosclerotic heart disease of native coronary artery without angina pectoris: Secondary | ICD-10-CM | POA: Diagnosis present

## 2019-09-20 LAB — MYOCARDIAL PERFUSION IMAGING
Peak HR: 94 {beats}/min
Rest HR: 75 {beats}/min
SDS: 1
SRS: 0
SSS: 1
TID: 1.02

## 2019-09-20 MED ORDER — TECHNETIUM TC 99M TETROFOSMIN IV KIT
31.1000 | PACK | Freq: Once | INTRAVENOUS | Status: AC | PRN
  Administered 2019-09-20: 31.1 via INTRAVENOUS
  Filled 2019-09-20: qty 32

## 2019-09-20 MED ORDER — TECHNETIUM TC 99M TETROFOSMIN IV KIT
10.5000 | PACK | Freq: Once | INTRAVENOUS | Status: AC | PRN
  Administered 2019-09-20: 10.5 via INTRAVENOUS
  Filled 2019-09-20: qty 11

## 2019-09-20 MED ORDER — REGADENOSON 0.4 MG/5ML IV SOLN
0.4000 mg | Freq: Once | INTRAVENOUS | Status: AC
  Administered 2019-09-20: 0.4 mg via INTRAVENOUS

## 2019-09-23 ENCOUNTER — Telehealth: Payer: Self-pay | Admitting: Nurse Practitioner

## 2019-09-23 NOTE — Telephone Encounter (Signed)
Agree with plans to start with pirmary md We can see him if Dr. Arelia Sneddon does not think its due to lungs.

## 2019-09-23 NOTE — Telephone Encounter (Signed)
-----   Message from Thayer Headings, MD sent at 09/23/2019  7:23 AM EDT ----- Low risk myoview

## 2019-09-23 NOTE — Telephone Encounter (Signed)
Reviewed test results with patient who verbalized understanding. He asks why he can't sleep because of the congestion and fluttering of his heart if everything is normal. I advised that the test results are not indicative of a concern for blood flow or ischemia but that we also have to take into consideration his symptoms. He states he felt fluttering in his heart all through the night and had to sit up to get his breath. He reports that he is taking Mucinex and it is helping break up the congestion in his lungs. He states no one will listen to his lungs. I advised that Dr. Acie Fredrickson is usually very thorough in listening to heart and lungs and apologized if this did not occur during his visit. I asked if he has symptoms of SOB, swelling in abdomen or extremities, orthopnea, or weight gain. He states he has gained 3 lb over the last week. He does not weigh routinely each morning in the same clothing.  He denies swelling and advises that symptoms of "not being able to breath" are worse at night but that sitting up and coughing at times helps him. I explained that his EF from his echo in July was normal at 60-65% but that it could change and advised that I will need to review his symptoms with Dr. Acie Fredrickson. He again brought up the fact that the Mucinex helps him clear some congestion and that his head and nose are also stopped up. I asked him to call Dr. Arelia Sneddon for evaluation and to call back if Dr. Arelia Sneddon thinks that he has symptoms of heart failure. Patient verbalized understanding and agreement and thanked me for the call.

## 2019-10-11 ENCOUNTER — Encounter: Payer: Self-pay | Admitting: Internal Medicine

## 2019-10-11 ENCOUNTER — Ambulatory Visit: Payer: Medicare Other | Admitting: Internal Medicine

## 2019-10-11 ENCOUNTER — Other Ambulatory Visit: Payer: Self-pay

## 2019-10-11 DIAGNOSIS — I493 Ventricular premature depolarization: Secondary | ICD-10-CM | POA: Diagnosis not present

## 2019-10-11 NOTE — Patient Instructions (Addendum)
Medication Instructions:  Your physician recommends that you continue on your current medications as directed. Please refer to the Current Medication list given to you today.  Labwork: None ordered.  Testing/Procedures: Your physician has recommended that you wear an event monitor. Event monitors are medical devices that record the heart's electrical activity. Doctors most often [place] these monitors to diagnose arrhythmias. Arrhythmias are problems with the speed or rhythm of the heartbeat. The monitor is a small, portable device. You can wear one while you do your normal daily activities. This is usually used to diagnose what is causing palpitations/syncope (passing out).  You will be scheduled for a 24 hour heart monitor  Follow-Up: Your physician wants you to follow-up in: follow up based on results of your heart monitor.  Any Other Special Instructions Will Be Listed Below (If Applicable).  If you need a refill on your cardiac medications before your next appointment, please call your pharmacy.

## 2019-10-11 NOTE — Progress Notes (Signed)
HPI Thomas [name] is referred by Dr. Acie Fredrickson for evaluation of PVC's. The patient has undergone evaluation in the past and had a low risk myoview. His EF is normal. He did not feel much in the way of palpitations. He thinks the mucinex he is taking for his allergies has made his symptoms better. He has not had syncope. He denies chest pain or sob.  Allergies  Allergen Reactions  . Oxycodone Nausea Only  . Robaxin [Methocarbamol] Nausea Only  . Tramadol Nausea Only and Other (See Comments)    Hallucinations     Current Outpatient Medications  Medication Sig Dispense Refill  . aspirin EC 81 MG tablet Take 1 tablet (81 mg total) by mouth daily.    . clopidogrel (PLAVIX) 75 MG tablet Take 75 mg by mouth daily with breakfast.    . levothyroxine (SYNTHROID, LEVOTHROID) 150 MCG tablet Take 150 mcg by mouth daily.    [name] lisinopril (ZESTRIL) 20 MG tablet Take 10 mg by mouth daily.    [name] loratadine (CLARITIN) 10 MG tablet Take 10 mg by mouth daily as needed for allergies.    . nitroGLYCERIN (NITROSTAT) 0.4 MG SL tablet Place 1 tablet (0.4 mg total) under the tongue every 5 (five) minutes as needed for chest pain. 25 tablet 6  . simvastatin (ZOCOR) 40 MG tablet Take 40 mg by mouth every evening.     No current facility-administered medications for this visit.      Past Medical History:  Diagnosis Date  . Arthritis   . Bifascicular bundle branch block   . 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 Coronary artery disease    takes Plavix and ASA daily  . GERD (gastroesophageal reflux disease)    occasional and will take Tums if needed  . History of blood clots 2008   in chest  . History of kidney stones late 90's  . Hyperlipidemia    takes Simvastatin daily  . Hypertension    takes Maxzide daily  . Hypothyroidism    takes Synthroid daily  . Joint pain   . Joint swelling    left knee  . Pneumonia    at 78 years old, double    ROS:   All systems reviewed and negative except as noted in the HPI.    Past Surgical History:  Procedure Laterality Date  . CARDIAC CATHETERIZATION    . CHOLECYSTECTOMY N/A 06/06/2018   Procedure: LAPAROSCOPIC CHOLECYSTECTOMY WITH INTRAOPERATIVE CHOLANGIOGRAM;  Surgeon: Donnie Mesa, MD;  Location: [facility];  Service: General;  Laterality: N/A;  . CORONARY ANGIOPLASTY  05/21/2007   lad stent/taxus stent  . HEMORROIDECTOMY  late 1970's  . HERNIA REPAIR Right 1980's  . 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 TOTAL HIP ARTHROPLASTY Left 05/08/2015   Procedure: LEFT TOTAL HIP ARTHROPLASTY ANTERIOR APPROACH;  Surgeon: Gaynelle Arabian, MD;  Location: [facility];  Service: Orthopedics;  Laterality: Left;     Family History  Problem Relation Age of Onset  . CAD Mother   . Heart failure Mother   . Pneumonia Father   . Heart failure Father      Social History   Socioeconomic History  . Marital status: Married    Spouse name: Not on file  . Number of children: Not on file  . Years of education: Not on file  . Highest education  level: Not on file  Occupational History  . Not on file  Social Needs  . Financial resource strain: Not on file  . Food insecurity    Worry: Not on file    Inability: Not on file  . Transportation needs    Medical: Not on file    Non-medical: Not on file  Tobacco Use  . Smoking status: Former Smoker    Packs/day: 0.25    Years: 52.00    Pack years: 13.00    Types: Cigarettes    Quit date: 2016    Years since quitting: 4.8  . Smokeless tobacco: Never Used  . Tobacco comment: trying to quit  Substance and Sexual Activity  . Alcohol use: No  . Drug use: No  . Sexual activity: Not on file  Lifestyle  . Physical activity    Days per week: Not on file    Minutes per session: Not on file  . Stress: Not on file  Relationships  . Social Musician on phone: Not on  file    Gets together: Not on file    Attends religious service: Not on file    Active member of club or organization: Not on file    Attends meetings of clubs or organizations: Not on file    Relationship status: Not on file  . Intimate partner violence    Fear of current or ex partner: Not on file    Emotionally abused: Not on file    Physically abused: Not on file    Forced sexual activity: Not on file  Other Topics Concern  . Not on file  Social History Narrative  . Not on file     BP 122/68   Pulse 68   Ht 6' (1.829 m)   Wt 210 lb 12.8 oz (95.6 kg)   SpO2 99%   BMI 28.59 kg/m   Physical Exam:  Well appearing NAD HEENT: Unremarkable Neck:  No JVD, no thyromegally Lymphatics:  No adenopathy Back:  No CVA tenderness Lungs:  Clear with no wheezes HEART:  IRegular rate rhythm, no murmurs, no rubs, no clicks Abd:  soft, positive bowel sounds, no organomegally, no rebound, no guarding Ext:  2 plus pulses, no edema, no cyanosis, no clubbing Skin:  No rashes no nodules Neuro:  CN II through XII intact, motor grossly intact  EKG - NSR with frequent PVC's and couplets.   Assess/Plan: 1. PVC's - even though he is minimally symptomatic, we do not know his PVC burden. I have asked him to wear a 24 hour holter monitor. Additional rec's will depend on the density of his PVC burden.  2. HTN - his SBP is working normally. We will recheck in several months.   Leonia Reeves.D.

## 2019-10-15 NOTE — Addendum Note (Signed)
Addended by: Willeen Cass A on: 10/15/2019 08:04 AM   Modules accepted: Orders

## 2019-10-16 ENCOUNTER — Telehealth: Payer: Self-pay | Admitting: Internal Medicine

## 2019-10-16 NOTE — Telephone Encounter (Signed)
3 day ZIO XT long term holter monitor to be mailed to the patients home.  Instructions reviewed briefly as they are included in the monitor kit. 

## 2019-10-16 NOTE — Telephone Encounter (Signed)
New Message   Patient is calling in reference to receiving his holter monitor.

## 2019-10-30 ENCOUNTER — Ambulatory Visit (INDEPENDENT_AMBULATORY_CARE_PROVIDER_SITE_OTHER): Payer: Medicare Other

## 2019-10-30 DIAGNOSIS — I493 Ventricular premature depolarization: Secondary | ICD-10-CM

## 2019-11-03 ENCOUNTER — Emergency Department (HOSPITAL_COMMUNITY)
Admission: EM | Admit: 2019-11-03 | Discharge: 2019-11-03 | Disposition: A | Discharge status: Home / Self Care | Payer: Medicare Other | Attending: Emergency Medicine | Admitting: Emergency Medicine

## 2019-11-03 ENCOUNTER — Other Ambulatory Visit: Payer: Self-pay

## 2019-11-03 ENCOUNTER — Encounter (HOSPITAL_COMMUNITY): Payer: Self-pay | Admitting: Emergency Medicine

## 2019-11-03 ENCOUNTER — Emergency Department (HOSPITAL_COMMUNITY): Payer: Medicare Other

## 2019-11-03 DIAGNOSIS — E039 Hypothyroidism, unspecified: Secondary | ICD-10-CM | POA: Diagnosis not present

## 2019-11-03 DIAGNOSIS — R0602 Shortness of breath: Secondary | ICD-10-CM | POA: Diagnosis present

## 2019-11-03 DIAGNOSIS — I10 Essential (primary) hypertension: Secondary | ICD-10-CM | POA: Diagnosis not present

## 2019-11-03 DIAGNOSIS — R008 Other abnormalities of heart beat: Secondary | ICD-10-CM | POA: Insufficient documentation

## 2019-11-03 DIAGNOSIS — I498 Other specified cardiac arrhythmias: Secondary | ICD-10-CM

## 2019-11-03 DIAGNOSIS — Z87891 Personal history of nicotine dependence: Secondary | ICD-10-CM | POA: Insufficient documentation

## 2019-11-03 DIAGNOSIS — Z7982 Long term (current) use of aspirin: Secondary | ICD-10-CM | POA: Insufficient documentation

## 2019-11-03 DIAGNOSIS — I2510 Atherosclerotic heart disease of native coronary artery without angina pectoris: Secondary | ICD-10-CM | POA: Diagnosis not present

## 2019-11-03 DIAGNOSIS — Z79899 Other long term (current) drug therapy: Secondary | ICD-10-CM | POA: Insufficient documentation

## 2019-11-03 DIAGNOSIS — Z20828 Contact with and (suspected) exposure to other viral communicable diseases: Secondary | ICD-10-CM | POA: Insufficient documentation

## 2019-11-03 LAB — TROPONIN I (HIGH SENSITIVITY)
Troponin I (High Sensitivity): 18 ng/L — ABNORMAL HIGH (ref <18)
Troponin I (High Sensitivity): 23 ng/L — ABNORMAL HIGH (ref <18)

## 2019-11-03 LAB — CBC
HCT: 48 % (ref 39.0–52.0)
Hemoglobin: 15.5 g/dL (ref 13.0–17.0)
MCH: 34.6 pg — ABNORMAL HIGH (ref 26.0–34.0)
MCHC: 32.3 g/dL (ref 30.0–36.0)
MCV: 107.1 fL — ABNORMAL HIGH (ref 80.0–100.0)
Platelets: 205 10*3/uL (ref 150–400)
RBC: 4.48 MIL/uL (ref 4.22–5.81)
RDW: 13.8 % (ref 11.5–15.5)
WBC: 5.2 10*3/uL (ref 4.0–10.5)
nRBC: 0 % (ref 0.0–0.2)

## 2019-11-03 LAB — BASIC METABOLIC PANEL
Anion gap: 8 (ref 5–15)
BUN: 15 mg/dL (ref 8–23)
CO2: 24 mmol/L (ref 22–32)
Calcium: 9 mg/dL (ref 8.9–10.3)
Chloride: 109 mmol/L (ref 98–111)
Creatinine, Ser: 1.48 mg/dL — ABNORMAL HIGH (ref 0.61–1.24)
GFR calc Af Amer: 52 mL/min — ABNORMAL LOW (ref >60)
GFR calc non Af Amer: 45 mL/min — ABNORMAL LOW (ref >60)
Glucose, Bld: 118 mg/dL — ABNORMAL HIGH (ref 70–99)
Potassium: 4.1 mmol/L (ref 3.5–5.1)
Sodium: 141 mmol/L (ref 135–145)

## 2019-11-03 LAB — D-DIMER, QUANTITATIVE: D-Dimer, Quant: 0.35 ug/mL-FEU (ref 0.00–0.50)

## 2019-11-03 LAB — POC SARS CORONAVIRUS 2 AG -  ED: SARS Coronavirus 2 Ag: NEGATIVE

## 2019-11-03 LAB — MAGNESIUM: Magnesium: 1.5 mg/dL — ABNORMAL LOW (ref 1.7–2.4)

## 2019-11-03 MED ORDER — MAGNESIUM SULFATE 2 GM/50ML IV SOLN
2.0000 g | Freq: Once | INTRAVENOUS | Status: AC
  Administered 2019-11-03: 14:00:00 2 g via INTRAVENOUS
  Filled 2019-11-03: qty 50

## 2019-11-03 MED ORDER — SODIUM CHLORIDE 0.9 % IV BOLUS (SEPSIS)
500.0000 mL | Freq: Once | INTRAVENOUS | Status: AC
  Administered 2019-11-03: 500 mL via INTRAVENOUS

## 2019-11-03 MED ORDER — SODIUM CHLORIDE 0.9% FLUSH
3.0000 mL | Freq: Once | INTRAVENOUS | Status: AC
  Administered 2019-11-03: 3 mL via INTRAVENOUS

## 2019-11-03 NOTE — ED Provider Notes (Signed)
[facility]   CSN: [id number] Arrival date & time: 11/03/19  0840     History   Chief Complaint Chief Complaint  Patient presents with  . Shortness of Breath  . Bradycardia    HPI Thomas [name] is a 78 y.o. male.     Patient complains of some shortness of breath.  Patient is followed by cardiology and has had a history of bigeminy.  No chest pain  The history is provided by the patient. No language interpreter was used.  Shortness of Breath Severity:  Mild Onset quality:  Sudden Timing:  Unable to specify Progression:  Improving Chronicity:  New Context: not activity   Worsened by:  Nothing Ineffective treatments:  None tried Associated symptoms: no abdominal pain, no chest pain, no cough, no headaches and no rash     Past Medical History:  Diagnosis Date  . Arthritis   . Bifascicular bundle branch block   . 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 Coronary artery disease    takes Plavix and ASA daily  . GERD (gastroesophageal reflux disease)    occasional and will take Tums if needed  . History of blood clots 2008   in chest  . History of kidney stones late 90's  . Hyperlipidemia    takes Simvastatin daily  . Hypertension    takes Maxzide daily  . Hypothyroidism    takes Synthroid daily  . Joint pain   . Joint swelling    left knee  . Pneumonia    at 78 years old, double    Patient Active Problem List   Diagnosis Date Noted  . Ventricular ectopy 10/11/2019  . Hypomagnesemia 07/02/2019  . Nonspecific abnormal electrocardiogram (ECG) (EKG) 07/02/2019  . Near syncope 07/01/2019  . Preoperative cardiovascular examination   . Abdominal pain 06/04/2018  . Acute pancreatitis 06/04/2018  . 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 Gallstones 06/04/2018  . Hypokalemia 10/30/2013  . Postoperative anemia due to acute blood loss 10/29/2013  . OA (osteoarthritis) of hip 10/28/2013   . CAD (coronary artery disease) 05/13/2011  . HTN (hypertension) 05/13/2011  . Hyperlipidemia 05/13/2011    Past Surgical History:  Procedure Laterality Date  . CARDIAC CATHETERIZATION    . CHOLECYSTECTOMY N/A 06/06/2018   Procedure: LAPAROSCOPIC CHOLECYSTECTOMY WITH INTRAOPERATIVE CHOLANGIOGRAM;  Surgeon: Manus Ruddsuei, Matthew, MD;  [facility];  Service: General;  Laterality: N/A;  . CORONARY ANGIOPLASTY  05/21/2007   lad stent/taxus stent  . HEMORROIDECTOMY  late 1970's  . HERNIA REPAIR Right 1980's  . TOTAL HIP ARTHROPLASTY Right 10/28/2013   Procedure: RIGHT TOTAL HIP ARTHROPLASTY ANTERIOR APPROACH;  Surgeon: Loanne DrillingFrank V Aluisio, MD;  Location: [facility];  Service: Orthopedics;  Laterality: Right;  . TOTAL HIP ARTHROPLASTY Left 05/08/2015   Procedure: LEFT TOTAL HIP ARTHROPLASTY ANTERIOR APPROACH;  Surgeon: Ollen GrossFrank Aluisio, MD;  Location: [facility];  Service: Orthopedics;  Laterality: Left;        Home Medications    Prior to Admission medications   Medication Sig Start Date End Date Taking? Authorizing Provider  aspirin EC 81 MG tablet Take 1 tablet (81 mg total) by mouth daily. 04/02/18   Nahser, Deloris PingPhilip J, MD  clopidogrel (PLAVIX) 75 MG tablet Take 75 mg by mouth daily with breakfast.    [provider]  levothyroxine (SYNTHROID, LEVOTHROID) 150 MCG tablet Take 150 mcg by mouth daily. 01/04/18   [provider]  lisinopril (ZESTRIL) 20 MG tablet Take  10 mg by mouth daily. 09/09/19   [provider]  loratadine (CLARITIN) 10 MG tablet Take 10 mg by mouth daily as needed for allergies.    [provider]  nitroGLYCERIN (NITROSTAT) 0.4 MG SL tablet Place 1 tablet (0.4 mg total) under the tongue every 5 (five) minutes as needed for chest pain. 04/02/18   Nahser, Wonda Cheng, MD  simvastatin (ZOCOR) 40 MG tablet Take 40 mg by mouth every evening.    [provider]    Family History Family History  Problem Relation Age of Onset  . CAD Mother   . Heart  failure Mother   . Pneumonia Father   . Heart failure Father     Social History Social History   Tobacco Use  . Smoking status: Former Smoker    Packs/day: 0.25    Years: 52.00    Pack years: 13.00    Types: Cigarettes    Quit date: 2016    Years since quitting: 4.8  . Smokeless tobacco: Never Used  . Tobacco comment: trying to quit  Substance Use Topics  . Alcohol use: No  . Drug use: No     Allergies   Oxycodone, Robaxin [methocarbamol], and Tramadol   Review of Systems Review of Systems  Constitutional: Negative for appetite change and fatigue.  HENT: Negative for congestion, ear discharge and sinus pressure.   Eyes: Negative for discharge.  Respiratory: Positive for shortness of breath. Negative for cough.   Cardiovascular: Negative for chest pain.  Gastrointestinal: Negative for abdominal pain and diarrhea.  Genitourinary: Negative for frequency and hematuria.  Musculoskeletal: Negative for back pain.  Skin: Negative for rash.  Neurological: Negative for seizures and headaches.  Psychiatric/Behavioral: Negative for hallucinations.     Physical Exam Updated Vital Signs BP 128/85   Pulse (!) 33   Temp 98.1 F (36.7 C) (Oral)   Resp 14   Ht 6' (1.829 m)   Wt 94.3 kg   SpO2 97%   BMI 28.21 kg/m   Physical Exam Vitals signs and nursing note reviewed.  Constitutional:      Appearance: He is well-developed.  HENT:     Head: Normocephalic.     Nose: Nose normal.  Eyes:     General: No scleral icterus.    Conjunctiva/sclera: Conjunctivae normal.  Neck:     Musculoskeletal: Neck supple.     Thyroid: No thyromegaly.  Cardiovascular:     Rate and Rhythm: Normal rate.     Heart sounds: No murmur. No friction rub. No gallop.      Comments: Irregular Pulmonary:     Breath sounds: No stridor. No wheezing or rales.  Chest:     Chest wall: No tenderness.  Abdominal:     General: There is no distension.     Tenderness: There is no abdominal  tenderness. There is no rebound.  Musculoskeletal: Normal range of motion.  Lymphadenopathy:     Cervical: No cervical adenopathy.  Skin:    Findings: No erythema or rash.  Neurological:     Mental Status: He is alert and oriented to person, place, and time.     Motor: No abnormal muscle tone.     Coordination: Coordination normal.  Psychiatric:        Behavior: Behavior normal.      ED Treatments / Results  Labs (all labs ordered are listed, but only abnormal results are displayed) Labs Reviewed  BASIC METABOLIC PANEL - Abnormal; Notable for the following components:  Result Value   Glucose, Bld 118 (*)    Creatinine, Ser 1.48 (*)    GFR calc non Af Amer 45 (*)    GFR calc Af Amer 52 (*)    All other components within normal limits  CBC - Abnormal; Notable for the following components:   MCV 107.1 (*)    MCH 34.6 (*)    All other components within normal limits  MAGNESIUM - Abnormal; Notable for the following components:   Magnesium 1.5 (*)    All other components within normal limits  TROPONIN I (HIGH SENSITIVITY) - Abnormal; Notable for the following components:   Troponin I (High Sensitivity) 18 (*)    All other components within normal limits  TROPONIN I (HIGH SENSITIVITY) - Abnormal; Notable for the following components:   Troponin I (High Sensitivity) 23 (*)    All other components within normal limits  D-DIMER, QUANTITATIVE (NOT AT [facility])  POC SARS CORONAVIRUS 2 AG -  ED    EKG EKG Interpretation  Date/Time:  Sunday November 03 2019 09:08:10 EST Ventricular Rate:  79 PR Interval:  168 QRS Duration: 132 QT Interval:  428 QTC Calculation: 490 R Axis:   -62 Text Interpretation: Sinus rhythm with occasional Premature ventricular complexes and Premature atrial complexes Left axis deviation Right bundle branch block Abnormal ECG Confirmed by Bethann Berkshire [phone]) on 11/03/2019 11:50:29 AM   Radiology Dg Chest 2 View  Result Date: 11/03/2019 CLINICAL  DATA:  Dyspnea, chest pain EXAM: CHEST - 2 VIEW COMPARISON:  07/30/2015 chest radiograph. FINDINGS: Stable cardiomediastinal silhouette with top-normal heart size. No pneumothorax. No pleural effusion. Lungs appear clear, with no acute consolidative airspace disease and no pulmonary edema. IMPRESSION: No active cardiopulmonary disease. Electronically Signed   By: Delbert Phenix M.D.   On: 11/03/2019 10:15    Procedures Procedures (including critical care time)  Medications Ordered in ED Medications  sodium chloride flush (NS) 0.9 % injection 3 mL (3 mLs Intravenous Given 11/03/19 1206)  sodium chloride 0.9 % bolus 500 mL (0 mLs Intravenous Stopped 11/03/19 1330)  magnesium sulfate IVPB 2 g 50 mL (0 g Intravenous Stopped 11/03/19 1520)     Initial Impression / Assessment and Plan / ED Course  I have reviewed the triage vital signs and the nursing notes.  Pertinent labs & imaging results that were available during my care of the patient were reviewed by me and considered in my medical decision making (see chart for details).        Labs unremarkable except for magnesium a little low.  Patient's heart rate is in the 80s but he is only perfusion half the beats.  Patient blood pressure has been normal.  I spoke with his cardiologist Dr. Melburn Popper and he stated that the patient can be followed up in the clinic.  Patient was given some magnesium IV and sent home Final Clinical Impressions(s) / ED Diagnoses   Final diagnoses:  Bigeminy    ED Discharge Orders    None       Bethann Berkshire, MD 11/03/19 1524

## 2019-11-03 NOTE — Discharge Instructions (Addendum)
Follow-up with your heart doctor within a week.  Call Monday to make an appoint for next week.  Tell them that you were in the emergency department and we spoke with your cardiologist and he stated need to be followed up by next week

## 2019-11-03 NOTE — ED Notes (Signed)
Patient verbalizes understanding of discharge instructions . Opportunity for questions and answers were provided . Armband removed by staff ,Pt discharged from ED. W/C  offered at D/C  and Declined W/C at D/C and was escorted to lobby by RN.  

## 2019-11-03 NOTE — ED Triage Notes (Signed)
Report from [facility]> Pt from home.  C/o SOB since last night.  Lung sounds clear.  HR 30-40s.  Denies Chest pain.  NS 500 cc given PTA.

## 2019-11-03 NOTE — ED Notes (Signed)
Pt to xray

## 2019-11-05 ENCOUNTER — Ambulatory Visit: Payer: Medicare Other | Admitting: Physician Assistant

## 2019-11-05 ENCOUNTER — Encounter: Payer: Self-pay | Admitting: Physician Assistant

## 2019-11-05 ENCOUNTER — Other Ambulatory Visit: Payer: Self-pay

## 2019-11-05 VITALS — BP 150/70 | HR 46 | Ht 72.0 in | Wt 212.8 lb

## 2019-11-05 DIAGNOSIS — I493 Ventricular premature depolarization: Secondary | ICD-10-CM | POA: Diagnosis not present

## 2019-11-05 DIAGNOSIS — I2510 Atherosclerotic heart disease of native coronary artery without angina pectoris: Secondary | ICD-10-CM | POA: Diagnosis not present

## 2019-11-05 DIAGNOSIS — E782 Mixed hyperlipidemia: Secondary | ICD-10-CM

## 2019-11-05 DIAGNOSIS — I10 Essential (primary) hypertension: Secondary | ICD-10-CM | POA: Diagnosis not present

## 2019-11-05 DIAGNOSIS — Z7189 Other specified counseling: Secondary | ICD-10-CM

## 2019-11-05 NOTE — Progress Notes (Signed)
Cardiology Office Note    Date:  11/05/2019   ID:  Thomas [name], Thomas [name] July 30, 1941, MRN [id number]  PCP:  Kaleen Mask, MD  Cardiologist: Dr. Elease Hashimoto Electrophysiologist: Dr. Ladona Ridgel  Chief Complaint: 6 weeks follow up  History of Present Illness:   Thomas [name] is a 78 y.o. male with hx of CAD, HTN, HLD and GERD seen for follow up.   Hx of CAD s/p PTCA and stenting of his left anterior descending artery. He's had thrombus formation on the proximal edge of the stent while off plavix which extended out over the left circumflex artery. He is on lifelong Plavix. He had 2 hip replacement on Plavix without any issue.   Last seen by Dr. Elease Hashimoto 10/14/2019. Noted dyspnea and slow hart rate (found to have frequent bigeminy).  Follow up  stress nuclear study was low risk with normal perfusion. The study could not be gated due to frequent PVCs. BMP and TSH are stable. The patient was seen by Dr. Ladona Ridgel and recommended monitor for PVCs burden.   Seem in ER for dyspnea. Labs unremarkable except for magnesium a little low which supplemented. Noted frequent PVCs.   Here today for follow up. Patient has dyspnea all the times but intermittently its get worse especially with laying down. No energy. Denies chest pain, PND, syncope, dizziness, LE edem or melena. He has turned in monitor 5 days>> report is still pending.    Past Medical History:  Diagnosis Date  . Arthritis   . Bifascicular bundle branch block   . 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 Coronary artery disease    takes Plavix and ASA daily  . GERD (gastroesophageal reflux disease)    occasional and will take Tums if needed  . History of blood clots 2008   in chest  . History of kidney stones late 90's  . Hyperlipidemia    takes Simvastatin daily  . Hypertension    takes Maxzide daily  . Hypothyroidism    takes Synthroid daily  . Joint pain   . Joint swelling    left knee  . Pneumonia    at 78 years old, double    Past Surgical  History:  Procedure Laterality Date  . CARDIAC CATHETERIZATION    . CHOLECYSTECTOMY N/A 06/06/2018   Procedure: LAPAROSCOPIC CHOLECYSTECTOMY WITH INTRAOPERATIVE CHOLANGIOGRAM;  Surgeon: Manus Rudd, MD;  Location: [facility] OR;  Service: General;  Laterality: N/A;  . CORONARY ANGIOPLASTY  05/21/2007   lad stent/taxus stent  . HEMORROIDECTOMY  late 1970's  . HERNIA REPAIR Right 1980's  . TOTAL HIP ARTHROPLASTY Right 10/28/2013   Procedure: RIGHT TOTAL HIP ARTHROPLASTY ANTERIOR APPROACH;  Surgeon: Loanne Drilling, MD;  Location: [facility] ORS;  Service: Orthopedics;  Laterality: Right;  . TOTAL HIP ARTHROPLASTY Left 05/08/2015   Procedure: LEFT TOTAL HIP ARTHROPLASTY ANTERIOR APPROACH;  Surgeon: Ollen Gross, MD;  Location: [facility] OR;  Service: Orthopedics;  Laterality: Left;    Current Medications: Prior to Admission medications   Medication Sig Start Date End Date Taking? Authorizing Provider  aspirin EC 81 MG tablet Take 1 tablet (81 mg total) by mouth daily. 04/02/18   Nahser, Deloris Ping, MD  clopidogrel (PLAVIX) 75 MG tablet Take 75 mg by mouth daily with breakfast.    [provider]  levothyroxine (SYNTHROID, LEVOTHROID) 150 MCG tablet Take 150 mcg by mouth daily. 01/04/18   [provider]  lisinopril (ZESTRIL) 20 MG tablet Take 10 mg by mouth daily. 09/09/19   [provider]  loratadine (CLARITIN) 10 MG tablet Take 10 mg by mouth daily as needed for allergies.    [provider]  nitroGLYCERIN (NITROSTAT) 0.4 MG SL tablet Place 1 tablet (0.4 mg total) under the tongue every 5 (five) minutes as needed for chest pain. 04/02/18   Nahser, Deloris Ping, MD  simvastatin (ZOCOR) 40 MG tablet Take 40 mg by mouth every evening.    [provider]    Allergies:   Oxycodone, Robaxin [methocarbamol], and Tramadol   Social History   Socioeconomic History  . Marital status: Married    Spouse name: Not on file  . Number of children: Not on file  . Years of education: Not  on file  . Highest education level: Not on file  Occupational History  . Not on file  Social Needs  . Financial resource strain: Not on file  . Food insecurity    Worry: Not on file    Inability: Not on file  . Transportation needs    Medical: Not on file    Non-medical: Not on file  Tobacco Use  . Smoking status: Former Smoker    Packs/day: 0.25    Years: 52.00    Pack years: 13.00    Types: Cigarettes    Quit date: 2016    Years since quitting: 4.9  . Smokeless tobacco: Never Used  . Tobacco comment: trying to quit  Substance and Sexual Activity  . Alcohol use: No  . Drug use: No  . Sexual activity: Not on file  Lifestyle  . Physical activity    Days per week: Not on file    Minutes per session: Not on file  . Stress: Not on file  Relationships  . Social Musician on phone: Not on file    Gets together: Not on file    Attends religious service: Not on file    Active member of club or organization: Not on file    Attends meetings of clubs or organizations: Not on file    Relationship status: Not on file  Other Topics Concern  . Not on file  Social History Narrative  . Not on file     Family History:  The patient's family history includes CAD in his mother; Heart failure in his father and mother; Pneumonia in his father.   ROS:   Please see the history of present illness.    ROS All other systems reviewed and are negative.   PHYSICAL EXAM:   VS:  BP (!) 150/70   Pulse (!) 46   Ht 6' (1.829 m)   Wt 212 lb 12.8 oz (96.5 kg)   SpO2 99%   BMI 28.86 kg/m    GEN: Well nourished, well developed, in no acute distress  HEENT: normal  Neck: no JVD, carotid bruits, or masses Cardiac: Irregular ; no murmurs, rubs, or gallops,no edema  Respiratory:  clear to auscultation bilaterally, normal work of breathing GI: soft, nontender, nondistended, + BS MS: no deformity or atrophy  Skin: warm and dry, no rash Neuro:  Alert and Oriented x 3, Strength and  sensation are intact Psych: euthymic mood, full affect  Wt Readings from Last 3 Encounters:  11/05/19 212 lb 12.8 oz (96.5 kg)  11/03/19 208 lb (94.3 kg)  10/11/19 210 lb 12.8 oz (95.6 kg)      Studies/Labs Reviewed:   EKG:  EKG is not ordered today.   Recent Labs: 09/13/2019: TSH 1.800 11/03/2019: BUN 15; Creatinine, Ser  1.48; Hemoglobin 15.5; Magnesium 1.5; Platelets 205; Potassium 4.1; Sodium 141   Lipid Panel    Component Value Date/Time   CHOL 109 04/02/2018 0919   TRIG 61 06/04/2018 2357   HDL 35 (L) 04/02/2018 0919   CHOLHDL 3.1 04/02/2018 0919   CHOLHDL 4.1 02/29/2016 0839   VLDL 36 (H) 02/29/2016 0839   LDLCALC 54 04/02/2018 0919    Additional studies/ records that were reviewed today include:   Stress test 09/2019  There was no ST segment deviation noted during stress.  No T wave inversion was noted during stress.  The study is normal.  This is a low risk study.   Low risk stress nuclear study with normal perfusion. The study could not be gated due to frequent PVCs.  Echocardiogram: 06/2019 1. The left ventricle has normal systolic function with an ejection fraction of 60-65%. The cavity size was normal. 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 Left ventricular diastolic function could not be evaluated due to  nondiagnostic images. No evidence of left ventricular regional wall motion abnormalities.  2. Left atrial size was mild-moderately dilated.  3. The aortic valve is tricuspid. Mild sclerosis of the aortic valve. Aortic valve regurgitation is trivial by color flow Doppler.  4. The aortic root and ascending aorta are normal in size and structure.  CARDIAC CATHETERIZATION 05/2007   Santonio Speakman is a 78 year old gentleman with a history of coronary artery  disease.  He had a long history of hyperlipidemia and cigarette smoking.  He had an LAD stent placed several years ago.  He presented to the  hospital with episodes of chest pain  last week.  Cardiac catheterization  at that time revealed a large stent in the origin of the left circumflex  artery.  He was treated with Integrilin and heparin, Plavix and aspirin  for the next 4 days, and we brought back today for relook.   PROCEDURES:  Limited left heart catheterization with coronary  angiography.   The right femoral artery was easily cannulated using modified Seldinger  technique.   LV pressure was 131/78.   Angiography, left main.  The left main has minor luminal irregularities.   The left anterior descending artery is a large vessel.  The stent is  located in the proximal aspect of the LAD and actually overlaps the  circumflex artery to a slight degree.   The stent is widely patent.  The mid and distal LAD have no significant  irregularities.   Left circumflex artery is a moderate-sized vessel.  There is a thrombus  in the origin of the left circumflex artery, but the thrombus appears to  be significantly smaller than at the previous catheterization.  He has  TIMI grade 3 flow down the circumflex artery.   CONCLUSION:  1. Significant coronary artery disease involving the left circumflex      artery.  He has a thrombus in the origin of the left circumflex      artery that appears to be successfully resolving on medical      therapy.  We will anticipate that he goes home today on Plavix and      aspirin.  He has been instructed to come back if he has any further      episodes of chest pain.  I worry that evaluating this further with      an IVUS would quite possibly dislodge the thrombus.  We will plan      on doing a stress Cardiolite  study in approximately 6 months.     ASSESSMENT & PLAN:    1. Symptomatic ventricular bigeminy - wait monitor result. He turned in last week. Further plan per Dr. Ladona Ridgelaylor.   2. CAD - Low risk stress test with normal perfusion. No chest pain. Continue ASA, Plavix and statin.   3. HTN - up today. Continue  ACE at current dose. Advised to keep log.   4. HLD - Continue statin.    Medication Adjustments/Labs and Tests Ordered: Current medicines are reviewed at length with the patient today.  Concerns regarding medicines are outlined above.  Medication changes, Labs and Tests ordered today are listed in the Patient Instructions below. Patient Instructions  Medication Instructions:  Your physician recommends that you continue on your current medications as directed. Please refer to the Current Medication list given to you today.  Labwork: NONE  Testing/Procedures: NONE  Follow-Up: Follow up as scheduled with Dr. Elease HashimotoNahser.  If you need a refill on your cardiac medications before your next appointment, please call your pharmacy.       Lorelei PontSigned, Jamya Starry, [place]  11/05/2019 12:16 PM    [facility] [address], [place], [place]  [address] Phone: [phone]; Fax: [phone]

## 2019-11-05 NOTE — Patient Instructions (Signed)
Medication Instructions:  Your physician recommends that you continue on your current medications as directed. Please refer to the Current Medication list given to you today.  Labwork: NONE  Testing/Procedures: NONE  Follow-Up: Follow up as scheduled with Dr. Acie Fredrickson.  If you need a refill on your cardiac medications before your next appointment, please call your pharmacy.

## 2019-11-11 ENCOUNTER — Other Ambulatory Visit: Payer: Self-pay

## 2019-11-11 ENCOUNTER — Telehealth: Payer: Self-pay | Admitting: Internal Medicine

## 2019-11-11 NOTE — Telephone Encounter (Signed)
New Message    Pt is calling and would like results from his Monitor    Please call

## 2019-11-11 NOTE — Telephone Encounter (Signed)
Monitor results imported and assigned to Dr. Lovena Le to review.

## 2019-11-12 MED ORDER — AMIODARONE HCL 200 MG PO TABS: 200.0000 mg | ORAL_TABLET | Freq: Every day | ORAL | 3 refills | Status: DC

## 2019-11-12 NOTE — Telephone Encounter (Signed)
LMTCB

## 2019-11-12 NOTE — Addendum Note (Signed)
Addended by: Willeen Cass A on: 11/12/2019 06:19 PM   Modules accepted: Orders

## 2019-11-12 NOTE — Telephone Encounter (Signed)
Returned call to Pt.  Dr. Lovena Le reviewed Pt heart monitor.  Per Dr. Lovena Le start amiodarone 200 mg one tablet by mouth daily.  Follow up in 2-3 months.  Pt notified.  All questions answered.    Pt will start amiodarone.  Pt has follow up with Dr. Acie Fredrickson in January.  Will plan f/u with Dr. Lovena Le in March.  Pt in agreement. Advised Pt to call this nurse if any issues with medication.

## 2019-11-13 NOTE — Telephone Encounter (Signed)
Patient will need to stop simvastatin 40 mg.  Switch to rosuvastatin 20 mg.

## 2019-11-13 NOTE — Telephone Encounter (Signed)
Call placed to Dr. Arelia Sneddon office.  Left message for Dr. Tretha Sciara and nurse.  Will allow PCP to manage statins per Pt request.

## 2019-11-13 NOTE — Telephone Encounter (Signed)
Outreach made to Pt  Advised that he should stop his simvastatin and change to rosuvastatin while he is takiing amiodarone.  Pt requests that this nurse call Dr. Arelia Sneddon and advise him.  If that is what Dr. Arelia Sneddon recommends he would like their office to call him to tell him that.  Will comply

## 2019-12-24 ENCOUNTER — Other Ambulatory Visit: Payer: Self-pay

## 2019-12-24 ENCOUNTER — Ambulatory Visit (INDEPENDENT_AMBULATORY_CARE_PROVIDER_SITE_OTHER): Payer: Medicare Other | Admitting: Cardiovascular Disease

## 2019-12-24 ENCOUNTER — Encounter: Payer: Self-pay | Admitting: Cardiovascular Disease

## 2019-12-24 VITALS — BP 122/74 | HR 50 | Ht 72.0 in | Wt 218.8 lb

## 2019-12-24 DIAGNOSIS — I2510 Atherosclerotic heart disease of native coronary artery without angina pectoris: Secondary | ICD-10-CM

## 2019-12-24 DIAGNOSIS — I493 Ventricular premature depolarization: Secondary | ICD-10-CM | POA: Diagnosis not present

## 2019-12-24 DIAGNOSIS — R55 Syncope and collapse: Secondary | ICD-10-CM | POA: Diagnosis not present

## 2019-12-24 NOTE — Progress Notes (Signed)
Cardiology Office Note   Date:  12/24/2019   ID:  Thomas [name], Thomas [name] 05/12/1941, MRN [id number]  PCP:  Kaleen Mask, MD  Cardiologist:   Kristeen Miss, MD   Chief Complaint  Patient presents with  . Coronary Artery Disease   Problem list: 1. Coronary artery disease-has a stent in his left anterior descending artery. The proximal edge of the stent hangs out into the left main. He's had thrombus formation on the proximal edge of the stent that embolized down the left circumflex artery when he was off Plavix. Because of this we have kept him on Plavix lifelong   2. Essential hypertension -   3. Hyperlipidemia-   Thomas [name] is a 79 year old gentleman with a history of coronary artery disease. He is status post PTCA and stenting of his left anterior descending artery. He's had thrombus formation on the proximal edge of the stent which extended out over the left circumflex artery. He's been on Plavix since that time.  He complains of some hip pain. He still smokes a few cigarettes a day.  February 26, 2014:  Since I last saw him 1 1/2 years ago, he has had hip surgery . I had recommneded that he not stop his plavix. Dr. Despina Hick opperated on him ON plavix and had no significant bleeding episodes.   He has not had any CP   February 26, 2015 Thomas [name] is a 79 y.o. male who presents for pre op visit for left hip replacement.  He had his last hip replacment WHILE on full dose Plavix and had no bleeding .  He has no CP or dyspnea.  He is limited by his hip problems.   February 29, 2016:  Doing well.   Had another hip replacement in May .  Had the surgery while on Plavix . No CP Stays active.  Walks a mile a day   February 28, 2017:  Thomas [name] is seen back today for f/u of his CAD No CP or dyspnea.  Exercising regularly   April 02, 2018:  Thomas [name] is seen today for follow up of his CAD , hypertension, hyperlipidemia. Walks some,  2 miles a day - perhaps twice a week ,   No CP o r dyspnea.   No dizziness.  He is on aspirin 325 mg a day.  We will have him decrease his dose to 81 mg a day. Also refill his nitroglycerin.   Oct. 2, 2020   Thomas [name] is seen today for follow-up of his coronary artery disease, hypertension and hyperlipidemia.  HR has been slow for the past month or so. Has been in bigeminy. ( so his reported slow HR is actually bigeminy PVCs)    has had shortness of breath - more dyspnea over the past month  Echo shows normal LV systolic function with EF 60-65% .   Could not assess diastolic function   He has CAD - his symptoms were just dyspnea.   Has never had any angina .   Was recently admitted for near syncope thought to be  volume depletion   Jan. 12, 2021: Seen for follow up of his CAD , HTN, hyperllipidemia  Had bigeminy and we set him up to see Dr. Ladona [name]. He was stated on amiodarone which has helped .  Is sleeping better    Past Medical History:  Diagnosis Date  . Arthritis   . Bifascicular bundle branch block   . 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 Coronary artery disease  takes Plavix and ASA daily  . GERD (gastroesophageal reflux disease)    occasional and will take Tums if needed  . History of blood clots 2008   in chest  . History of kidney stones late 90's  . Hyperlipidemia    takes Simvastatin daily  . Hypertension    takes Maxzide daily  . Hypothyroidism    takes Synthroid daily  . Joint pain   . Joint swelling    left knee  . Pneumonia    at 79 years old, double    Past Surgical History:  Procedure Laterality Date  . CARDIAC CATHETERIZATION    . CHOLECYSTECTOMY N/A 06/06/2018   Procedure: LAPAROSCOPIC CHOLECYSTECTOMY WITH INTRAOPERATIVE CHOLANGIOGRAM;  Surgeon: Manus Rudd, MD;  Location: [facility] OR;  Service: General;  Laterality: N/A;  . CORONARY ANGIOPLASTY  05/21/2007   lad stent/taxus stent  . HEMORROIDECTOMY  late 1970's  . HERNIA REPAIR Right 1980's  . TOTAL HIP ARTHROPLASTY Right 10/28/2013   Procedure:  RIGHT TOTAL HIP ARTHROPLASTY ANTERIOR APPROACH;  Surgeon: Loanne Drilling, MD;  Location: [facility] ORS;  Service: Orthopedics;  Laterality: Right;  . TOTAL HIP ARTHROPLASTY Left 05/08/2015   Procedure: LEFT TOTAL HIP ARTHROPLASTY ANTERIOR APPROACH;  Surgeon: Ollen Gross, MD;  Location: [facility] OR;  Service: Orthopedics;  Laterality: Left;     Current Outpatient Medications  Medication Sig Dispense Refill  . amiodarone (PACERONE) 200 MG tablet Take 1 tablet (200 mg total) by mouth daily. 90 tablet 3  . aspirin EC 81 MG tablet Take 1 tablet (81 mg total) by mouth daily.    . clopidogrel (PLAVIX) 75 MG tablet Take 75 mg by mouth daily with breakfast.    . levothyroxine (SYNTHROID, LEVOTHROID) 150 MCG tablet Take 150 mcg by mouth daily.    [name] lisinopril (ZESTRIL) 20 MG tablet Take 10 mg by mouth daily.    [name] loratadine (CLARITIN) 10 MG tablet Take 10 mg by mouth daily as needed for allergies.    . nitroGLYCERIN (NITROSTAT) 0.4 MG SL tablet Place 1 tablet (0.4 mg total) under the tongue every 5 (five) minutes as needed for chest pain. 25 tablet 6  . simvastatin (ZOCOR) 40 MG tablet Take 40 mg by mouth every evening.     No current facility-administered medications for this visit.    Allergies:   Oxycodone, Robaxin [methocarbamol], and Tramadol    Social History:  The patient  reports that he quit smoking about 5 years ago. His smoking use included cigarettes. He has a 13.00 pack-year smoking history. He has never used smokeless tobacco. He reports that he does not drink alcohol or use drugs.   Family History:  The patient's family history includes CAD in his mother; Heart failure in his father and mother; Pneumonia in his father.    ROS:  Noted in current history, otherwise review of systems is negative.   Physical Exam: Blood pressure 122/74, pulse (!) 50, height 6' (1.829 m), weight 218 lb 12.8 oz (99.2 kg), SpO2 98 %.  GEN:   Elderly gentleman, no acute distress HEENT: Normal NECK: No JVD; No  carotid bruits LYMPHATICS: No lymphadenopathy CARDIAC: RRR , no murmurs, rubs, gallops RESPIRATORY:  Clear to auscultation without rales, wheezing or rhonchi  ABDOMEN: Soft, non-tender, non-distended MUSCULOSKELETAL:  No edema; No deformity  SKIN: Warm and dry NEUROLOGIC:  Alert and oriented x 3   EKG:     Recent Labs: 09/13/2019: TSH 1.800 11/03/2019: BUN 15; Creatinine, Ser 1.48; Hemoglobin 15.5; Magnesium 1.5; Platelets 205;  Potassium 4.1; Sodium 141    Lipid Panel    Component Value Date/Time   CHOL 109 04/02/2018 0919   TRIG 61 06/04/2018 2357   HDL 35 (L) 04/02/2018 0919   CHOLHDL 3.1 04/02/2018 0919   CHOLHDL 4.1 02/29/2016 0839   VLDL 36 (H) 02/29/2016 0839   LDLCALC 54 04/02/2018 0919      Wt Readings from Last 3 Encounters:  12/24/19 218 lb 12.8 oz (99.2 kg)  11/05/19 212 lb 12.8 oz (96.5 kg)  11/03/19 208 lb (94.3 kg)      Other studies Reviewed: Additional studies/ records that were reviewed today include: . Review of the above records demonstrates:    ASSESSMENT AND PLAN:  1. Coronary artery disease-has a stent in his left anterior descending artery. The proximal edge of the stent hangs out into the left main. He's had thrombus formation on the proximal edge of the stent that embolized down the left circumflex artery when he was off Plavix. Because of this we have kept him on Plavix lifelong   He has had 2 hip replacements while on Plavix and did very well.   He should remain on Plavix lifelong .     he has not had any significant episodes of CP   2. Bigeminy :  Better on amiodarone   2. Essential hypertension -BP is well controlled.    3. Hyperlipidemia continue current medications.  4. Osteoarthritis   Current medicines are reviewed at length with the patient today.  The patient does not have concerns regarding medicines.  The following changes have been made:  no change  Labs/ tests ordered today include:   No orders of the defined types  were placed in this encounter.    Disposition:   FU with me in 12 months    Signed, Mertie Moores, MD  12/24/2019 3:50 PM    [facility] [place], [place], [place]  [address] Phone: [phone]; Fax: [phone]

## 2019-12-24 NOTE — Patient Instructions (Signed)
Medication Instructions:  Your physician recommends that you continue on your current medications as directed. Please refer to the Current Medication list given to you today.  *If you need a refill on your cardiac medications before your next appointment, please call your pharmacy*  Lab Work: None Ordered If you have labs (blood work) drawn today and your tests are completely normal, you will receive your results only by: [name] MyChart Message (if you have MyChart) OR . A paper copy in the mail If you have any lab test that is abnormal or we need to change your treatment, we will call you to review the results.   Testing/Procedures: None Ordered   Follow-Up: At [facility], you and your health needs are our priority.  As part of our continuing mission to provide you with exceptional heart care, we have created designated Provider Care Teams.  These Care Teams include your primary Cardiologist (physician) and Advanced Practice Providers (APPs -  Physician Assistants and Nurse Practitioners) who all work together to provide you with the care you need, when you need it.  Your next appointment:   6 month(s)  The format for your next appointment:   Either In Person or Virtual  Provider:   Chelsea Aus, PA-C

## 2020-02-13 ENCOUNTER — Encounter: Payer: Self-pay | Admitting: Internal Medicine

## 2020-02-13 ENCOUNTER — Ambulatory Visit: Payer: Medicare Other | Admitting: Internal Medicine

## 2020-02-13 ENCOUNTER — Other Ambulatory Visit: Payer: Self-pay

## 2020-02-13 VITALS — BP 140/88 | HR 57 | Ht 72.0 in | Wt 221.0 lb

## 2020-02-13 DIAGNOSIS — I493 Ventricular premature depolarization: Secondary | ICD-10-CM

## 2020-02-13 NOTE — Progress Notes (Signed)
HPI [name] returns today for followup. He is a pleasant 79 yo man with a h/o CAD, PVC's and HTN. He was seen by me after developing worsening ventricular ectopy. We started amiodarone and he has been on 200 mg daily. No side effects. He feels much better. More energy.  Allergies  Allergen Reactions  . Oxycodone Nausea Only  . Robaxin [Methocarbamol] Nausea Only  . Tramadol Nausea Only and Other (See Comments)    Hallucinations     Current Outpatient Medications  Medication Sig Dispense Refill  . amiodarone (PACERONE) 200 MG tablet Take 1 tablet (200 mg total) by mouth daily. 90 tablet 3  . aspirin EC 81 MG tablet Take 1 tablet (81 mg total) by mouth daily.    . clopidogrel (PLAVIX) 75 MG tablet Take 75 mg by mouth daily with breakfast.    . levothyroxine (SYNTHROID, LEVOTHROID) 150 MCG tablet Take 150 mcg by mouth daily.    [name] lisinopril (ZESTRIL) 20 MG tablet Take 10 mg by mouth daily.    [name] loratadine (CLARITIN) 10 MG tablet Take 10 mg by mouth daily as needed for allergies.    . nitroGLYCERIN (NITROSTAT) 0.4 MG SL tablet Place 1 tablet (0.4 mg total) under the tongue every 5 (five) minutes as needed for chest pain. 25 tablet 6  . simvastatin (ZOCOR) 40 MG tablet Take 40 mg by mouth every evening.     No current facility-administered medications for this visit.     Past Medical History:  Diagnosis Date  . Arthritis   . Bifascicular bundle branch block   . 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 Coronary artery disease    takes Plavix and ASA daily  . GERD (gastroesophageal reflux disease)    occasional and will take Tums if needed  . History of blood clots 2008   in chest  . History of kidney stones late 90's  . Hyperlipidemia    takes Simvastatin daily  . Hypertension    takes Maxzide daily  . Hypothyroidism    takes Synthroid daily  . Joint pain   . Joint swelling    left knee  . Pneumonia    at 79 years old, double    ROS:   All systems reviewed and negative except as  noted in the HPI.   Past Surgical History:  Procedure Laterality Date  . CARDIAC CATHETERIZATION    . CHOLECYSTECTOMY N/A 06/06/2018   Procedure: LAPAROSCOPIC CHOLECYSTECTOMY WITH INTRAOPERATIVE CHOLANGIOGRAM;  Surgeon: Donnie Mesa, MD;  Location: [facility];  Service: General;  Laterality: N/A;  . CORONARY ANGIOPLASTY  05/21/2007   lad stent/taxus stent  . HEMORROIDECTOMY  late 1970's  . HERNIA REPAIR Right 1980's  . 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 TOTAL HIP ARTHROPLASTY Left 05/08/2015   Procedure: LEFT TOTAL HIP ARTHROPLASTY ANTERIOR APPROACH;  Surgeon: Gaynelle Arabian, MD;  Location: [facility];  Service: Orthopedics;  Laterality: Left;     Family History  Problem Relation Age of Onset  . CAD Mother   . Heart failure Mother   . Pneumonia Father   . Heart failure Father      Social History   Socioeconomic History  . Marital status: Married    Spouse name: Not on file  . Number of children: Not on file  . Years of education: Not on file  .  Highest education level: Not on file  Occupational History  . Not on file  Tobacco Use  . Smoking status: Former Smoker    Packs/day: 0.25    Years: 52.00    Pack years: 13.00    Types: Cigarettes    Quit date: 2016    Years since quitting: 5.1  . Smokeless tobacco: Never Used  . Tobacco comment: trying to quit  Substance and Sexual Activity  . Alcohol use: No  . Drug use: No  . Sexual activity: Not on file  Other Topics Concern  . Not on file  Social History Narrative  . Not on file   Social Determinants of Health   Financial Resource Strain:   . Difficulty of Paying Living Expenses: Not on file  Food Insecurity:   . Worried About Programme researcher, broadcasting/film/video in the Last Year: Not on file  . Ran Out of Food in the Last Year: Not on file  Transportation Needs:   . Lack of  Transportation (Medical): Not on file  . Lack of Transportation (Non-Medical): Not on file  Physical Activity:   . Days of Exercise per Week: Not on file  . Minutes of Exercise per Session: Not on file  Stress:   . Feeling of Stress : Not on file  Social Connections:   . Frequency of Communication with Friends and Family: Not on file  . Frequency of Social Gatherings with Friends and Family: Not on file  . Attends Religious Services: Not on file  . Active Member of Clubs or Organizations: Not on file  . Attends Banker Meetings: Not on file  . Marital Status: Not on file  Intimate Partner Violence:   . Fear of Current or Ex-Partner: Not on file  . Emotionally Abused: Not on file  . Physically Abused: Not on file  . Sexually Abused: Not on file     BP 140/88   Pulse (!) 57   Ht 6' (1.829 m)   Wt 221 lb (100.2 kg)   SpO2 98%   BMI 29.97 kg/m   Physical Exam:  Well appearing NAD HEENT: Unremarkable Neck:  No JVD, no thyromegally Lymphatics:  No adenopathy Back:  No CVA tenderness Lungs:  Clear with no wheezes HEART:  Regular rate rhythm, no murmurs, no rubs, no clicks Abd:  soft, positive bowel sounds, no organomegally, no rebound, no guarding Ext:  2 plus pulses, no edema, no cyanosis, no clubbing Skin:  No rashes no nodules Neuro:  CN II through XII intact, motor grossly intact  EKG - nsr with RBBB   Assess/Plan: 1. PVC's - he is much improved. He will continue amio 200 mg daily for 6 months and then we will reduce his dose to 200 mg daily, none on Sunday. 2. CAD - he denies anginal symptoms. He is walking once or twice a day. 3. Dyslipidemia - he is on simvastatin and probably not a good choice for 40 mg with amio. I will have him reduce the dose to 10 mg daily. 4. HTN - his bp is minimally elevated. He will reduce his salt intake.  Leonia Reeves.D.

## 2020-02-13 NOTE — Patient Instructions (Addendum)

## 2020-06-22 ENCOUNTER — Other Ambulatory Visit: Payer: Self-pay

## 2020-06-22 ENCOUNTER — Encounter: Payer: Self-pay | Admitting: Physician Assistant

## 2020-06-22 ENCOUNTER — Ambulatory Visit: Payer: Medicare Other | Admitting: Physician Assistant

## 2020-06-22 VITALS — BP 110/70 | HR 83 | Ht 72.0 in | Wt 217.0 lb

## 2020-06-22 DIAGNOSIS — Z7902 Long term (current) use of antithrombotics/antiplatelets: Secondary | ICD-10-CM

## 2020-06-22 DIAGNOSIS — Z5181 Encounter for therapeutic drug level monitoring: Secondary | ICD-10-CM | POA: Diagnosis not present

## 2020-06-22 DIAGNOSIS — I2510 Atherosclerotic heart disease of native coronary artery without angina pectoris: Secondary | ICD-10-CM | POA: Diagnosis not present

## 2020-06-22 DIAGNOSIS — E785 Hyperlipidemia, unspecified: Secondary | ICD-10-CM

## 2020-06-22 DIAGNOSIS — I10 Essential (primary) hypertension: Secondary | ICD-10-CM

## 2020-06-22 DIAGNOSIS — I493 Ventricular premature depolarization: Secondary | ICD-10-CM

## 2020-06-22 DIAGNOSIS — N183 Chronic kidney disease, stage 3 unspecified: Secondary | ICD-10-CM

## 2020-06-22 MED ORDER — CLOPIDOGREL BISULFATE 75 MG PO TABS: 75.0000 mg | ORAL_TABLET | Freq: Every day | ORAL | 3 refills | Status: DC

## 2020-06-22 NOTE — Progress Notes (Signed)
Cardiology Office Note    Date:  06/22/2020   ID:  [name], Thomas [birth year], MRN [id number]  PCP:  Kaleen Mask, MD  Cardiologist: Dr. Elease Hashimoto Electrophysiologist: Dr. Ladona Ridgel  Chief Complaint: 6  Months follow up  History of Present Illness:   Thomas [name] is a 79 y.o. male with hx of CAD, HTN, HLD, CKD stage III and GERD seen for follow up.   Hx of CAD s/p PTCA and stenting of his left anterior descending artery. He's had thrombus formation on the proximal edge of the stent while off plavix which extended out over the left circumflex artery. He is on lifelong Plavix. He had 2 hip replacement on Plavix without any issue.   Seen 10/2019 for dyspnea and slow hart rate (found to have frequent bigeminy).  Follow up  stress nuclear study was low risk with normal perfusion. The study could not be gated due to frequent PVCs. BMP and TSH are stable. The patient was seen by Dr. Ladona Ridgel and recommended monitor for PVCs burden which was (35% with multifocal PVCs0. Symptoms improved on amiodarone.   Here today for follow-up.  No regular exercise but try to be active doing household and yard work.  Denies chest pain, shortness of breath, orthopnea, PND, syncope, lower extremity edema or melena.  Past Medical History:  Diagnosis Date  . Arthritis   . Bifascicular bundle branch block   . 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 Coronary artery disease    takes Plavix and ASA daily  . GERD (gastroesophageal reflux disease)    occasional and will take Tums if needed  . History of blood clots 2008   in chest  . History of kidney stones late 90's  . Hyperlipidemia    takes Simvastatin daily  . Hypertension    takes Maxzide daily  . Hypothyroidism    takes Synthroid daily  . Joint pain   . Joint swelling    left knee  . Pneumonia    at 79 years old, double    Past Surgical History:  Procedure Laterality Date  . CARDIAC CATHETERIZATION    . CHOLECYSTECTOMY N/A 06/06/2018   Procedure:  LAPAROSCOPIC CHOLECYSTECTOMY WITH INTRAOPERATIVE CHOLANGIOGRAM;  Surgeon: Manus Rudd, MD;  Location: [facility];  Service: General;  Laterality: N/A;  . CORONARY ANGIOPLASTY  05/21/2007   lad stent/taxus stent  . HEMORROIDECTOMY  late 1970's  . HERNIA REPAIR Right 1980's  . TOTAL HIP ARTHROPLASTY Right 10/28/2013   Procedure: RIGHT TOTAL HIP ARTHROPLASTY ANTERIOR APPROACH;  Surgeon: Loanne Drilling, MD;  Location: [facility];  Service: Orthopedics;  Laterality: Right;  . TOTAL HIP ARTHROPLASTY Left 05/08/2015   Procedure: LEFT TOTAL HIP ARTHROPLASTY ANTERIOR APPROACH;  Surgeon: Ollen Gross, MD;  Location: [facility];  Service: Orthopedics;  Laterality: Left;    Current Medications: Prior to Admission medications   Medication Sig Start Date End Date Taking? Authorizing Provider  amiodarone (PACERONE) 200 MG tablet Take 1 tablet (200 mg total) by mouth daily. 11/12/19   Marinus Maw, MD  aspirin EC 81 MG tablet Take 1 tablet (81 mg total) by mouth daily. 04/02/18   Nahser, Deloris Ping, MD  clopidogrel (PLAVIX) 75 MG tablet Take 75 mg by mouth daily with breakfast.    [provider]  levothyroxine (SYNTHROID, LEVOTHROID) 150 MCG tablet Take 150 mcg by mouth daily. 01/04/18   [provider]  lisinopril (ZESTRIL) 20 MG tablet Take 10 mg by mouth daily. 09/09/19   [provider]  loratadine (CLARITIN) 10 MG tablet Take 10 mg by mouth daily as needed for allergies.    [provider]  nitroGLYCERIN (NITROSTAT) 0.4 MG SL tablet Place 1 tablet (0.4 mg total) under the tongue every 5 (five) minutes as needed for chest pain. 04/02/18   Nahser, Deloris PingPhilip J, MD  simvastatin (ZOCOR) 10 MG tablet Take 10 mg by mouth daily.    [provider]    Allergies:   Oxycodone, Robaxin [methocarbamol], and Tramadol   Social History   Socioeconomic History  . Marital status: Married    Spouse name: Not on file  . Number of children: Not on file  . Years of education: Not on file    . Highest education level: Not on file  Occupational History  . Not on file  Tobacco Use  . Smoking status: Former Smoker    Packs/day: 0.25    Years: 52.00    Pack years: 13.00    Types: Cigarettes    Quit date: 2016    Years since quitting: 5.5  . Smokeless tobacco: Never Used  . Tobacco comment: trying to quit  Substance and Sexual Activity  . Alcohol use: No  . Drug use: No  . Sexual activity: Not on file  Other Topics Concern  . Not on file  Social History Narrative  . Not on file   Social Determinants of Health   Financial Resource Strain:   . Difficulty of Paying Living Expenses:   Food Insecurity:   . Worried About Programme researcher, broadcasting/film/videounning Out of Food in the Last Year:   . Baristaan Out of Food in the Last Year:   Transportation Needs:   . Freight forwarderLack of Transportation (Medical):   [name]. Lack of Transportation (Non-Medical):   Physical Activity:   . Days of Exercise per Week:   . Minutes of Exercise per Session:   Stress:   . Feeling of Stress :   Social Connections:   . Frequency of Communication with Friends and Family:   . Frequency of Social Gatherings with Friends and Family:   . Attends Religious Services:   . Active Member of Clubs or Organizations:   . Attends BankerClub or Organization Meetings:   [name]. Marital Status:      Family History:  The patient's family history includes CAD in his mother; Heart failure in his father and mother; Pneumonia in his father.   ROS:   Please see the history of present illness.    ROS All other systems reviewed and are negative.   PHYSICAL EXAM:   VS:  BP 110/70   Pulse 83   Ht 6' (1.829 m)   Wt 217 lb (98.4 kg)   SpO2 97%   BMI 29.43 kg/m    GEN: Well nourished, well developed, in no acute distress  HEENT: normal  Neck: no JVD, carotid bruits, or masses Cardiac: RRR; no murmurs, rubs, or gallops,no edema  Respiratory:  clear to auscultation bilaterally, normal work of breathing GI: soft, nontender, nondistended, + BS MS: no deformity or  atrophy  Skin: warm and dry, no rash Neuro:  Alert and Oriented x 3, Strength and sensation are intact Psych: euthymic mood, full affect  Wt Readings from Last 3 Encounters:  06/22/20 217 lb (98.4 kg)  02/13/20 221 lb (100.2 kg)  12/24/19 218 lb 12.8 oz (99.2 kg)      Studies/Labs Reviewed:   EKG:  EKG is not ordered today.    Recent Labs: 09/13/2019: TSH 1.800 11/03/2019: BUN 15;  Creatinine, Ser 1.48; Hemoglobin 15.5; Magnesium 1.5; Platelets 205; Potassium 4.1; Sodium 141   Lipid Panel    Component Value Date/Time   CHOL 109 04/02/2018 0919   TRIG 61 06/04/2018 2357   HDL 35 (L) 04/02/2018 0919   CHOLHDL 3.1 04/02/2018 0919   CHOLHDL 4.1 02/29/2016 0839   VLDL 36 (H) 02/29/2016 0839   LDLCALC 54 04/02/2018 0919    Additional studies/ records that were reviewed today include:   Monitor 10/2019 1. NSR with frequent PVC"s and PAC's 2. NSVT 3. NS AT 4. No prolonged pauses 5. 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  No T wave inversion was noted during stress.  The study is normal.  This is a low risk study.   Low risk stress nuclear study with normal perfusion. The study could not be gated due to frequent PVCs.  Echo 06/2019 1. The left ventricle has normal systolic function with an ejection  fraction of 60-65%. The cavity size was normal. There is moderate  asymmetric left ventricular hypertrophy. Left ventricular diastolic  function could not be evaluated due to  nondiagnostic images. No evidence of left ventricular regional wall motion  abnormalities.  2. Left atrial size was mild-moderately dilated.  3. The aortic valve is tricuspid. Mild sclerosis of the aortic valve.  Aortic valve regurgitation is trivial by color flow Doppler.  4. The aortic root and ascending aorta are normal in size and structure.    ASSESSMENT & PLAN:   1. CAD -No angina.  Continue dual  antiplatelet therapy with aspirin and Plavix.  He is on lifelong Plavix as summarized above.  2. HTN -Blood pressure stable on current medication.  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 Ladona Ridgel  4. HLD -On Simvastatin 10 mg daily.  Lipids followed by PCP. Patient statis he recently has lab with PCP but only Scr and Vita B 12 on received records.   5. CKD III - Recent  Scr was 1.8. followed by PCP.     Medication Adjustments/Labs and Tests Ordered: Current medicines are reviewed at length with the patient today.  Concerns regarding medicines are outlined above.  Medication changes, Labs and Tests ordered today are listed in the Patient Instructions below. Patient Instructions  Medication Instructions:  Your physician recommends that you continue on your current medications as directed. Please refer to the Current Medication list given to you today.  *If you need a refill on your cardiac medications before your next appointment, please call your pharmacy*   Lab Work: None ordered  If you have labs (blood work) drawn today and your tests are completely normal, you will receive your results only by: [name] MyChart Message (if you have MyChart) OR . A paper copy in the mail If you have any lab test that is abnormal or we need to change your treatment, we will call you to review the results.   Testing/Procedures: None ordered   Follow-Up: At [facility], you and your health needs are our priority.  As part of our continuing mission to provide you with exceptional heart care, we have created designated Provider Care Teams.  These Care Teams include your primary Cardiologist (physician) and Advanced Practice Providers (APPs -  Physician Assistants and Nurse Practitioners) who all work together to provide you with the care you need, when you need it.  We recommend signing up for the patient portal  called "MyChart".  Sign up information is provided on this After  Visit Summary.  MyChart is used to connect with patients for Virtual Visits (Telemedicine).  Patients are able to view lab/test results, encounter notes, upcoming appointments, etc.  Non-urgent messages can be sent to your provider as well.   To learn more about what you can do with MyChart, go to [email or web address].    Your next appointment:   6 month(s)  The format for your next appointment:   In Person  Provider:   You may see Kristeen Miss, MD or one of the following Advanced Practice Providers on your designated Care Team:    [name] Newcomer, PA-C  Chelsea Aus, PA-C    Other Instructions      Lorelei Pont, [place]  06/22/2020 2:04 PM    [facility] [address] [place], [place], [place]  [address] Phone: [phone]; Fax: [phone]

## 2020-06-22 NOTE — Patient Instructions (Signed)

## 2020-08-26 ENCOUNTER — Encounter: Payer: Self-pay | Admitting: Internal Medicine

## 2020-08-26 ENCOUNTER — Ambulatory Visit: Payer: Medicare Other | Admitting: Internal Medicine

## 2020-08-26 ENCOUNTER — Other Ambulatory Visit: Payer: Self-pay

## 2020-08-26 VITALS — BP 130/72 | HR 60 | Ht 72.0 in | Wt 216.0 lb

## 2020-08-26 DIAGNOSIS — I493 Ventricular premature depolarization: Secondary | ICD-10-CM

## 2020-08-26 DIAGNOSIS — E782 Mixed hyperlipidemia: Secondary | ICD-10-CM

## 2020-08-26 DIAGNOSIS — I10 Essential (primary) hypertension: Secondary | ICD-10-CM

## 2020-08-26 DIAGNOSIS — I2510 Atherosclerotic heart disease of native coronary artery without angina pectoris: Secondary | ICD-10-CM | POA: Diagnosis not present

## 2020-08-26 MED ORDER — AMIODARONE HCL 200 MG PO TABS: ORAL_TABLET | ORAL | 3 refills | Status: DC

## 2020-08-26 NOTE — Progress Notes (Signed)
HPI [name] returns today for followup. He is a pleasant 79 yo man with a h/o symptomatic PVC's, and preserved LV function who I started on amiodarone. He noted that about 2 weeks after starting, his PVC's resolved. He has done well in the interim. He notes that he easily sunburns. No chest pain or sob.  Allergies  Allergen Reactions  . Oxycodone Nausea Only  . Robaxin [Methocarbamol] Nausea Only  . Tramadol Nausea Only and Other (See Comments)    Hallucinations     Current Outpatient Medications  Medication Sig Dispense Refill  . amiodarone (PACERONE) 200 MG tablet Take 1 tablet (200 mg total) by mouth daily. 90 tablet 3  . aspirin EC 81 MG tablet Take 1 tablet (81 mg total) by mouth daily.    . clopidogrel (PLAVIX) 75 MG tablet Take 1 tablet (75 mg total) by mouth daily with breakfast. 90 tablet 3  . levothyroxine (SYNTHROID, LEVOTHROID) 150 MCG tablet Take 150 mcg by mouth daily.    [name] lisinopril (ZESTRIL) 10 MG tablet Take 10 mg by mouth daily.    [name] loratadine (CLARITIN) 10 MG tablet Take 10 mg by mouth daily as needed for allergies.    . nitroGLYCERIN (NITROSTAT) 0.4 MG SL tablet Place 1 tablet (0.4 mg total) under the tongue every 5 (five) minutes as needed for chest pain. 25 tablet 6  . simvastatin (ZOCOR) 10 MG tablet Take 10 mg by mouth daily.     No current facility-administered medications for this visit.     Past Medical History:  Diagnosis Date  . Arthritis   . Bifascicular bundle branch block   . 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 Coronary artery disease    takes Plavix and ASA daily  . GERD (gastroesophageal reflux disease)    occasional and will take Tums if needed  . History of blood clots 2008   in chest  . History of kidney stones late 90's  . Hyperlipidemia    takes Simvastatin daily  . Hypertension    takes Maxzide daily  . Hypothyroidism    takes Synthroid daily  . Joint pain   . Joint swelling    left knee  . Pneumonia    at 79 years old, double     ROS:   All systems reviewed and negative except as noted in the HPI.   Past Surgical History:  Procedure Laterality Date  . CARDIAC CATHETERIZATION    . CHOLECYSTECTOMY N/A 06/06/2018   Procedure: LAPAROSCOPIC CHOLECYSTECTOMY WITH INTRAOPERATIVE CHOLANGIOGRAM;  Surgeon: Manus Rudd, MD;  Location: [facility];  Service: General;  Laterality: N/A;  . CORONARY ANGIOPLASTY  05/21/2007   lad stent/taxus stent  . HEMORROIDECTOMY  late 1970's  . HERNIA REPAIR Right 1980's  . TOTAL HIP ARTHROPLASTY Right 10/28/2013   Procedure: RIGHT TOTAL HIP ARTHROPLASTY ANTERIOR APPROACH;  Surgeon: Loanne Drilling, MD;  Location: [facility];  Service: Orthopedics;  Laterality: Right;  . TOTAL HIP ARTHROPLASTY Left 05/08/2015   Procedure: LEFT TOTAL HIP ARTHROPLASTY ANTERIOR APPROACH;  Surgeon: Ollen Gross, MD;  Location: [facility];  Service: Orthopedics;  Laterality: Left;     Family History  Problem Relation Age of Onset  . CAD Mother   . Heart failure Mother   . Pneumonia Father   . Heart failure Father      Social History   Socioeconomic History  . Marital status: Married    Spouse name: Not on file  . Number of children: Not on file  .  Years of education: Not on file  . Highest education level: Not on file  Occupational History  . Not on file  Tobacco Use  . Smoking status: Former Smoker    Packs/day: 0.25    Years: 52.00    Pack years: 13.00    Types: Cigarettes    Quit date: 2016    Years since quitting: 5.7  . Smokeless tobacco: Never Used  . Tobacco comment: trying to quit  Substance and Sexual Activity  . Alcohol use: No  . Drug use: No  . Sexual activity: Not on file  Other Topics Concern  . Not on file  Social History Narrative  . Not on file   Social Determinants of Health   Financial Resource Strain:   . Difficulty of Paying Living Expenses: Not on file  Food Insecurity:   . Worried About Programme researcher, broadcasting/film/video in the Last Year: Not on file  . Ran Out of Food in the  Last Year: Not on file  Transportation Needs:   . Lack of Transportation (Medical): Not on file  . Lack of Transportation (Non-Medical): Not on file  Physical Activity:   . Days of Exercise per Week: Not on file  . Minutes of Exercise per Session: Not on file  Stress:   . Feeling of Stress : Not on file  Social Connections:   . Frequency of Communication with Friends and Family: Not on file  . Frequency of Social Gatherings with Friends and Family: Not on file  . Attends Religious Services: Not on file  . Active Member of Clubs or Organizations: Not on file  . Attends Banker Meetings: Not on file  . Marital Status: Not on file  Intimate Partner Violence:   . Fear of Current or Ex-Partner: Not on file  . Emotionally Abused: Not on file  . Physically Abused: Not on file  . Sexually Abused: Not on file     BP 130/72   Pulse 60   Ht 6' (1.829 m)   Wt 216 lb (98 kg)   SpO2 97%   BMI 29.29 kg/m   Physical Exam:  Well appearing NAD HEENT: Unremarkable Neck:  No JVD, no thyromegally Lymphatics:  No adenopathy Back:  No CVA tenderness Lungs:  Clear with no wheezes HEART:  Regular rate rhythm, no murmurs, no rubs, no clicks Abd:  soft, positive bowel sounds, no organomegally, no rebound, no guarding Ext:  2 plus pulses, no edema, no cyanosis, no clubbing Skin:  No rashes no nodules Neuro:  CN II through XII intact, motor grossly intact  EKG - NSR with RBBB   Assess/Plan: 1. PVC's - he is asymptomatic. He will continue amiodarone. I asked him to reduce his dose to 6 tabs daily. 2. HTN - his bp is well controlled. No change in his meds.  Sharlot Gowda Otisha Spickler,MD

## 2020-08-26 NOTE — Patient Instructions (Addendum)
Medication Instructions:  Your physician has recommended you make the following change in your medication:   1.  DECREASE your amiodarone 200 mg--Take one tablet by mouth daily EXCEPT do NOT take on Sunday.  Labwork: None ordered.  Testing/Procedures: None ordered.  Follow-Up: Your physician wants you to follow-up in: one year with Dr. Ladona Ridgel.   You will receive a reminder letter in the mail two months in advance. If you don't receive a letter, please call our office to schedule the follow-up appointment.   Any Other Special Instructions Will Be Listed Below (If Applicable).  If you need a refill on your cardiac medications before your next appointment, please call your pharmacy.

## 2020-12-30 ENCOUNTER — Encounter: Payer: Self-pay | Admitting: Cardiovascular Disease

## 2020-12-30 NOTE — Progress Notes (Signed)
Cardiology Office Note   Date:  12/31/2020   ID:  Thomas, [name] [birth year], MRN [id number]  PCP:  Kaleen Mask, MD  Cardiologist:   Kristeen Miss, MD   Chief Complaint  Patient presents with  . Coronary Artery Disease   Problem list: 1. Coronary artery disease-has a stent in his left anterior descending artery. The proximal edge of the stent hangs out into the left main. He's had thrombus formation on the proximal edge of the stent that embolized down the left circumflex artery when he was off Plavix. Because of this we have kept him on Plavix lifelong   2. Essential hypertension -   3. Hyperlipidemia-   Thomas [name] is a 80 year old gentleman with a history of coronary artery disease. He is status post PTCA and stenting of his left anterior descending artery. He's had thrombus formation on the proximal edge of the stent which extended out over the left circumflex artery. He's been on Plavix since that time.  He complains of some hip pain. He still smokes a few cigarettes a day.  February 26, 2014:  Since I last saw him 1 1/2 years ago, he has had hip surgery . I had recommneded that he not stop his plavix. Dr. Despina Hick opperated on him ON plavix and had no significant bleeding episodes.   He has not had any CP   February 26, 2015 Thomas [name] is a 79 y.o. male who presents for pre op visit for left hip replacement.  He had his last hip replacment WHILE on full dose Plavix and had no bleeding .  He has no CP or dyspnea.  He is limited by his hip problems.   February 29, 2016:  Doing well.   Had another hip replacement in May .  Had the surgery while on Plavix . No CP Stays active.  Walks a mile a day   February 28, 2017:  Thomas [name] is seen back today for f/u of his CAD No CP or dyspnea.  Exercising regularly   April 02, 2018:  Thomas [name] is seen today for follow up of his CAD , hypertension, hyperlipidemia. Walks some,  2 miles a day - perhaps twice a week ,   No CP o r dyspnea.   No dizziness.  He is on aspirin 325 mg a day.  We will have him decrease his dose to 81 mg a day. Also refill his nitroglycerin.   Oct. 2, 2020   Thomas [name] is seen today for follow-up of his coronary artery disease, hypertension and hyperlipidemia.  HR has been slow for the past month or so. Has been in bigeminy. ( so his reported slow HR is actually bigeminy PVCs)    has had shortness of breath - more dyspnea over the past month  Echo shows normal LV systolic function with EF 60-65% .   Could not assess diastolic function   He has CAD - his symptoms were just dyspnea.   Has never had any angina .   Was recently admitted for near syncope thought to be  volume depletion   Jan. 12, 2021: Seen for follow up of his CAD , HTN, hyperllipidemia  Had bigeminy and we set him up to see Dr. Ladona Ridgel. He was stated on amiodarone which has helped .  Is sleeping better   Jan. 20, 2022 Thomas [name] is seen today for follow up of his CAD, HTN, HLD  He is on lifelong plavix Was walking regularly until this most  recent snow / ice No angina  I reviewed labs from Dr. Jeannetta Nap office.  His total cholesterol is 126 Triglyceride level is 78 HDL is 55 LDL is 56. His labs look good.  We will continue with current medications.   Past Medical History:  Diagnosis Date  . Arthritis   . Bifascicular bundle branch block   . 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 Coronary artery disease    takes Plavix and ASA daily  . GERD (gastroesophageal reflux disease)    occasional and will take Tums if needed  . History of blood clots 2008   in chest  . History of kidney stones late 90's  . Hyperlipidemia    takes Simvastatin daily  . Hypertension    takes Maxzide daily  . Hypothyroidism    takes Synthroid daily  . Joint pain   . Joint swelling    left knee  . Pneumonia    at 80 years old, double    Past Surgical History:  Procedure Laterality Date  . CARDIAC CATHETERIZATION    . CHOLECYSTECTOMY  N/A 06/06/2018   Procedure: LAPAROSCOPIC CHOLECYSTECTOMY WITH INTRAOPERATIVE CHOLANGIOGRAM;  Surgeon: Manus Rudd, MD;  Location: [facility] OR;  Service: General;  Laterality: N/A;  . CORONARY ANGIOPLASTY  05/21/2007   lad stent/taxus stent  . HEMORROIDECTOMY  late 1970's  . HERNIA REPAIR Right 1980's  . TOTAL HIP ARTHROPLASTY Right 10/28/2013   Procedure: RIGHT TOTAL HIP ARTHROPLASTY ANTERIOR APPROACH;  Surgeon: Loanne Drilling, MD;  Location: [facility] ORS;  Service: Orthopedics;  Laterality: Right;  . TOTAL HIP ARTHROPLASTY Left 05/08/2015   Procedure: LEFT TOTAL HIP ARTHROPLASTY ANTERIOR APPROACH;  Surgeon: Ollen Gross, MD;  Location: [facility] OR;  Service: Orthopedics;  Laterality: Left;     Current Outpatient Medications  Medication Sig Dispense Refill  . amiodarone (PACERONE) 200 MG tablet Take one tablet by mouth daily EXCEPT do NOT take on Sunday. 90 tablet 3  . aspirin EC 81 MG tablet Take 1 tablet (81 mg total) by mouth daily.    . clopidogrel (PLAVIX) 75 MG tablet Take 1 tablet (75 mg total) by mouth daily with breakfast. 90 tablet 3  . levothyroxine (SYNTHROID, LEVOTHROID) 150 MCG tablet Take 150 mcg by mouth daily.    [name] lisinopril (ZESTRIL) 10 MG tablet Take 10 mg by mouth daily.    [name] loratadine (CLARITIN) 10 MG tablet Take 10 mg by mouth daily as needed for allergies.    . simvastatin (ZOCOR) 10 MG tablet Take 10 mg by mouth daily.     No current facility-administered medications for this visit.    Allergies:   Oxycodone, Robaxin [methocarbamol], and Tramadol    Social History:  The patient  reports that he quit smoking about 6 years ago. His smoking use included cigarettes. He has a 13.00 pack-year smoking history. He has never used smokeless tobacco. He reports that he does not drink alcohol and does not use drugs.   Family History:  The patient's family history includes CAD in his mother; Heart failure in his father and mother; Pneumonia in his father.    ROS:  Noted in current  history, otherwise review of systems is negative.  Physical Exam: Blood pressure 124/78, pulse 62, height 6' (1.829 m), weight 221 lb 6.4 oz (100.4 kg), SpO2 97 %.  GEN:  Well nourished, well developed in no acute distress HEENT: Normal NECK: No JVD; No carotid bruits LYMPHATICS: No lymphadenopathy CARDIAC: RRR , no murmurs, rubs, gallops RESPIRATORY:  Clear to auscultation without  rales, wheezing or rhonchi  ABDOMEN: Soft, non-tender, non-distended MUSCULOSKELETAL:  No edema; No deformity  SKIN: Warm and dry NEUROLOGIC:  Alert and oriented x 3   EKG:     Recent Labs: No results found for requested labs within last 8760 hours.    Lipid Panel    Component Value Date/Time   CHOL 109 04/02/2018 0919   TRIG 61 06/04/2018 2357   HDL 35 (L) 04/02/2018 0919   CHOLHDL 3.1 04/02/2018 0919   CHOLHDL 4.1 02/29/2016 0839   VLDL 36 (H) 02/29/2016 0839   LDLCALC 54 04/02/2018 0919      Wt Readings from Last 3 Encounters:  12/31/20 221 lb 6.4 oz (100.4 kg)  08/26/20 216 lb (98 kg)  06/22/20 217 lb (98.4 kg)      Other studies Reviewed: Additional studies/ records that were reviewed today include: . Review of the above records demonstrates:    ASSESSMENT AND PLAN:  1. Coronary artery disease-has a stent in his left anterior descending artery. The proximal edge of the stent hangs out into the left main. He's had thrombus formation on the proximal edge of the stent that embolized down the left circumflex artery when he was off Plavix. Because of this we have kept him on Plavix lifelong   He has had 2 hip replacements while on Plavix and did very well.   He should remain on Plavix lifelong .    He is doing very well Lipids have been managed by Dr. Jeannetta Nap - levels look very good    2. Bigeminy :  n recurrent bigminiy  2. Essential hypertension  - BP is well controlled.    3. Hyperlipidemia -  Managed by Dr. Jeannetta Nap.   Labs were reviewed and look good      Current  medicines are reviewed at length with the patient today.  The patient does not have concerns regarding medicines.  The following changes have been made:  no change  Labs/ tests ordered today include:   No orders of the defined types were placed in this encounter.    Disposition:   FU with me in 12 months    Signed, Kristeen Miss, MD  12/31/2020 11:34 AM    [facility] [address] [place], [place], [place]  [address] Phone: [phone]; Fax: [phone]

## 2020-12-31 ENCOUNTER — Ambulatory Visit: Payer: Medicare Other | Admitting: Cardiovascular Disease

## 2020-12-31 ENCOUNTER — Other Ambulatory Visit: Payer: Self-pay

## 2020-12-31 ENCOUNTER — Encounter: Payer: Self-pay | Admitting: Cardiovascular Disease

## 2020-12-31 VITALS — BP 124/78 | HR 62 | Ht 72.0 in | Wt 221.4 lb

## 2020-12-31 DIAGNOSIS — E782 Mixed hyperlipidemia: Secondary | ICD-10-CM

## 2020-12-31 DIAGNOSIS — I2510 Atherosclerotic heart disease of native coronary artery without angina pectoris: Secondary | ICD-10-CM

## 2020-12-31 NOTE — Patient Instructions (Signed)

## 2021-07-26 ENCOUNTER — Other Ambulatory Visit: Payer: Self-pay | Admitting: Physician Assistant

## 2021-08-27 ENCOUNTER — Other Ambulatory Visit: Payer: Self-pay

## 2021-08-27 ENCOUNTER — Ambulatory Visit: Payer: Medicare Other | Admitting: Internal Medicine

## 2021-08-27 VITALS — BP 134/72 | HR 54 | Ht 72.0 in | Wt 211.8 lb

## 2021-08-27 DIAGNOSIS — I493 Ventricular premature depolarization: Secondary | ICD-10-CM | POA: Diagnosis not present

## 2021-08-27 NOTE — Patient Instructions (Signed)
Medication Instructions:  Your physician recommends that you continue on your current medications as directed. Please refer to the Current Medication list given to you today.  *If you need a refill on your cardiac medications before your next appointment, please call your pharmacy*   Lab Work: Please have Dr. Jeannetta Nap draw LFTs & sed rate labs     Fax to Dr. Ladona Ridgel at [phone]   Testing/Procedures: None ordered   Follow-Up: At [facility], you and your health needs are our priority.  As part of our continuing mission to provide you with exceptional heart care, we have created designated Provider Care Teams.  These Care Teams include your primary Cardiologist (physician) and Advanced Practice Providers (APPs -  Physician Assistants and Nurse Practitioners) who all work together to provide you with the care you need, when you need it.  Your next appointment:   1 year(s)  The format for your next appointment:   In Person  Provider:   Lewayne Bunting, MD    Thank you for choosing [facility]!!

## 2021-08-27 NOTE — Progress Notes (Signed)
HPI [name] returns today for followup. He is a pleasant 80 yo man with a h/o symptomatic PVC's, and preserved LV function who I started on amiodarone now 2 years ago. He noted that about 2 weeks after starting, his PVC's resolved. He has done well in the interim. He notes that he easily sunburns. No chest pain or sob. He describes a single episode of palpitations associated with exertion a couple of weeks ago which resolved in seconds. He feels well otherwise and is looking forward to deer season. Allergies  Allergen Reactions   Oxycodone Nausea Only   Robaxin [Methocarbamol] Nausea Only   Tramadol Nausea Only and Other (See Comments)    Hallucinations     Current Outpatient Medications  Medication Sig Dispense Refill   amiodarone (PACERONE) 200 MG tablet Take one tablet by mouth daily EXCEPT do NOT take on Sunday. 90 tablet 3   aspirin EC 81 MG tablet Take 1 tablet (81 mg total) by mouth daily.     clopidogrel (PLAVIX) 75 MG tablet TAKE 1 TABLET BY MOUTH DAILY WITH BREAKFAST 90 tablet 1   levothyroxine (SYNTHROID, LEVOTHROID) 150 MCG tablet Take 150 mcg by mouth daily.     lisinopril (ZESTRIL) 10 MG tablet Take 10 mg by mouth daily.     loratadine (CLARITIN) 10 MG tablet Take 10 mg by mouth daily as needed for allergies.     simvastatin (ZOCOR) 10 MG tablet Take 10 mg by mouth daily.     No current facility-administered medications for this visit.     Past Medical History:  Diagnosis Date   Arthritis    Bifascicular bundle branch block    Bigeminy 06/2019   Coronary artery disease    takes Plavix and ASA daily   GERD (gastroesophageal reflux disease)    occasional and will take Tums if needed   History of blood clots 2008   in chest   History of kidney stones late 90's   Hyperlipidemia    takes Simvastatin daily   Hypertension    takes Maxzide daily   Hypothyroidism    takes Synthroid daily   Joint pain    Joint swelling    left knee   Pneumonia    at 80  years old, double    ROS:   All systems reviewed and negative except as noted in the HPI.   Past Surgical History:  Procedure Laterality Date   CARDIAC CATHETERIZATION     CHOLECYSTECTOMY N/A 06/06/2018   Procedure: LAPAROSCOPIC CHOLECYSTECTOMY WITH INTRAOPERATIVE CHOLANGIOGRAM;  Surgeon: Manus Rudd, MD;  Location: [facility] OR;  Service: General;  Laterality: N/A;   CORONARY ANGIOPLASTY  05/21/2007   lad stent/taxus stent   HEMORROIDECTOMY  late 1970's   HERNIA REPAIR Right 1980's   TOTAL HIP ARTHROPLASTY Right 10/28/2013   Procedure: RIGHT TOTAL HIP ARTHROPLASTY ANTERIOR APPROACH;  Surgeon: Loanne Drilling, MD;  Location: [facility];  Service: Orthopedics;  Laterality: Right;   TOTAL HIP ARTHROPLASTY Left 05/08/2015   Procedure: LEFT TOTAL HIP ARTHROPLASTY ANTERIOR APPROACH;  Surgeon: Ollen Gross, MD;  Location: [facility] OR;  Service: Orthopedics;  Laterality: Left;     Family History  Problem Relation Age of Onset   CAD Mother    Heart failure Mother    Pneumonia Father    Heart failure Father      Social History   Socioeconomic History   Marital status: Married    Spouse name: Not on file   Number of  children: Not on file   Years of education: Not on file   Highest education level: Not on file  Occupational History   Not on file  Tobacco Use   Smoking status: Former    Packs/day: 0.25    Years: 52.00    Pack years: 13.00    Types: Cigarettes    Quit date: 2016    Years since quitting: 6.7   Smokeless tobacco: Never   Tobacco comments:    trying to quit  Substance and Sexual Activity   Alcohol use: No   Drug use: No   Sexual activity: Not on file  Other Topics Concern   Not on file  Social History Narrative   Not on file   Social Determinants of Health   Financial Resource Strain: Not on file  Food Insecurity: Not on file  Transportation Needs: Not on file  Physical Activity: Not on file  Stress: Not on file  Social Connections: Not on file  Intimate Partner  Violence: Not on file     BP 134/72   Pulse (!) 54   Ht 6' (1.829 m)   Wt 211 lb 12.8 oz (96.1 kg)   SpO2 98%   BMI 28.73 kg/m   Physical Exam:  Well appearing NAD HEENT: Unremarkable Neck:  No JVD, no thyromegally Lymphatics:  No adenopathy Back:  No CVA tenderness Lungs:  Clear HEART:  Regular rate rhythm, no murmurs, no rubs, no clicks Abd:  soft, positive bowel sounds, no organomegally, no rebound, no guarding Ext:  2 plus pulses, no edema, no cyanosis, no clubbing Skin:  No rashes no nodules Neuro:  CN II through XII intact, motor grossly intact  EKG - NSR with RBBB  Assess/Plan:  1. PVC's - he is asymptomatic. He will continue amiodarone. I asked him to continue his dose to 6 tabs weekly. He will have amio labs drawn including a liver panel and sed rate when he sees Dr. Jeannetta Nap in a few weeks. 2. HTN - his bp is well controlled. No change in his meds.    Thomas [name] Thomas Milo,MD

## 2021-09-27 ENCOUNTER — Other Ambulatory Visit: Payer: Self-pay | Admitting: Internal Medicine

## 2021-12-02 NOTE — Progress Notes (Deleted)
Office Note     CC: Temporal artery biopsy Requesting Provider:  Kaleen Mask, *  HPI: Thomas [name] is a 80 y.o. ([birth year]) male presenting at the request of .Kaleen Mask, MD ***  The pt is  on a statin for cholesterol management.  The pt is  on a daily aspirin.   Other AC:  plavix The pt is not on medication for hypertension.   The pt is  diabetic.  Tobacco hx:  ***  Past Medical History:  Diagnosis Date   Arthritis    Bifascicular bundle branch block    Bigeminy 06/2019   Coronary artery disease    takes Plavix and ASA daily   GERD (gastroesophageal reflux disease)    occasional and will take Tums if needed   History of blood clots 2008   in chest   History of kidney stones late 90's   Hyperlipidemia    takes Simvastatin daily   Hypertension    takes Maxzide daily   Hypothyroidism    takes Synthroid daily   Joint pain    Joint swelling    left knee   Pneumonia    at 80 years old, double    Past Surgical History:  Procedure Laterality Date   CARDIAC CATHETERIZATION     CHOLECYSTECTOMY N/A 06/06/2018   Procedure: LAPAROSCOPIC CHOLECYSTECTOMY WITH INTRAOPERATIVE CHOLANGIOGRAM;  Surgeon: Manus Rudd, MD;  Location: [facility] OR;  Service: General;  Laterality: N/A;   CORONARY ANGIOPLASTY  05/21/2007   lad stent/taxus stent   HEMORROIDECTOMY  late 1970's   HERNIA REPAIR Right 1980's   TOTAL HIP ARTHROPLASTY Right 10/28/2013   Procedure: RIGHT TOTAL HIP ARTHROPLASTY ANTERIOR APPROACH;  Surgeon: Loanne Drilling, MD;  Location: [facility];  Service: Orthopedics;  Laterality: Right;   TOTAL HIP ARTHROPLASTY Left 05/08/2015   Procedure: LEFT TOTAL HIP ARTHROPLASTY ANTERIOR APPROACH;  Surgeon: Ollen Gross, MD;  Location: [facility] OR;  Service: Orthopedics;  Laterality: Left;    Social History   Socioeconomic History   Marital status: Married    Spouse name: Not on file   Number of children: Not on file   Years of education: Not on file   Highest  education level: Not on file  Occupational History   Not on file  Tobacco Use   Smoking status: Former    Packs/day: 0.25    Years: 52.00    Pack years: 13.00    Types: Cigarettes    Quit date: 2016    Years since quitting: 6.9   Smokeless tobacco: Never   Tobacco comments:    trying to quit  Substance and Sexual Activity   Alcohol use: No   Drug use: No   Sexual activity: Not on file  Other Topics Concern   Not on file  Social History Narrative   Not on file   Social Determinants of Health   Financial Resource Strain: Not on file  Food Insecurity: Not on file  Transportation Needs: Not on file  Physical Activity: Not on file  Stress: Not on file  Social Connections: Not on file  Intimate Partner Violence: Not on file   *** Family History  Problem Relation Age of Onset   CAD Mother    Heart failure Mother    Pneumonia Father    Heart failure Father     Current Outpatient Medications  Medication Sig Dispense Refill   amiodarone (PACERONE) 200 MG tablet TAKE 1 TABLET BY MOUTH DAILY EXCEPT SUNDAY. DO NOT TAKE  ON SUNDAY 90 tablet 3   aspirin EC 81 MG tablet Take 1 tablet (81 mg total) by mouth daily.     clopidogrel (PLAVIX) 75 MG tablet TAKE 1 TABLET BY MOUTH DAILY WITH BREAKFAST 90 tablet 1   levothyroxine (SYNTHROID, LEVOTHROID) 150 MCG tablet Take 150 mcg by mouth daily.     lisinopril (ZESTRIL) 10 MG tablet Take 10 mg by mouth daily.     loratadine (CLARITIN) 10 MG tablet Take 10 mg by mouth daily as needed for allergies.     simvastatin (ZOCOR) 10 MG tablet Take 10 mg by mouth daily.     No current facility-administered medications for this visit.    Allergies  Allergen Reactions   Oxycodone Nausea Only   Robaxin [Methocarbamol] Nausea Only   Tramadol Nausea Only and Other (See Comments)    Hallucinations     REVIEW OF SYSTEMS:  *** [X]  denotes positive finding, [ ]  denotes negative finding Cardiac  Comments:  Chest pain or chest pressure:     Shortness of breath upon exertion:    Short of breath when lying flat:    Irregular heart rhythm:        Vascular    Pain in calf, thigh, or hip brought on by ambulation:    Pain in feet at night that wakes you up from your sleep:     Blood clot in your veins:    Leg swelling:         Pulmonary    Oxygen at home:    Productive cough:     Wheezing:         Neurologic    Sudden weakness in arms or legs:     Sudden numbness in arms or legs:     Sudden onset of difficulty speaking or slurred speech:    Temporary loss of vision in one eye:     Problems with dizziness:         Gastrointestinal    Blood in stool:     Vomited blood:         Genitourinary    Burning when urinating:     Blood in urine:        Psychiatric    Major depression:         Hematologic    Bleeding problems:    Problems with blood clotting too easily:        Skin    Rashes or ulcers:        Constitutional    Fever or chills:      PHYSICAL EXAMINATION:  There were no vitals filed for this visit.  General:  WDWN in NAD; vital signs documented above Gait: Not observed HENT: WNL, normocephalic Pulmonary: normal non-labored breathing , without wheezing Cardiac: {Desc; regular/irreg:14544} HR, bruit*** Abdomen: soft, NT, no masses Skin: {With/Without:20273} rashes Vascular Exam/Pulses:  Right Left  Radial {Exam; arterial pulse strength 0-4:30167} {Exam; arterial pulse strength 0-4:30167}  Ulnar {Exam; arterial pulse strength 0-4:30167} {Exam; arterial pulse strength 0-4:30167}  Femoral {Exam; arterial pulse strength 0-4:30167} {Exam; arterial pulse strength 0-4:30167}  Popliteal {Exam; arterial pulse strength 0-4:30167} {Exam; arterial pulse strength 0-4:30167}  DP {Exam; arterial pulse strength 0-4:30167} {Exam; arterial pulse strength 0-4:30167}  PT {Exam; arterial pulse strength 0-4:30167} {Exam; arterial pulse strength 0-4:30167}   Extremities: {With/Without:20273} ischemic changes,  {With/Without:20273} Gangrene , {With/Without:20273} cellulitis; {With/Without:20273} open wounds;  Musculoskeletal: no muscle wasting or atrophy  Neurologic: A&O X 3;  No focal weakness or paresthesias are detected  Psychiatric:  The pt has {Desc; normal/abnormal:11317::"Normal"} affect.   Non-Invasive Vascular Imaging:   ***    ASSESSMENT/PLAN: AUGUSTINO SAVASTANO is a 80 y.o. male presenting with ***   ***   Victorino Sparrow, MD [facility] [phone]

## 2021-12-03 ENCOUNTER — Encounter: Payer: Medicare Other | Admitting: Vascular Surgery

## 2021-12-06 NOTE — Progress Notes (Signed)
[facility] [place]  ASSESSMENT / PLAN: 80 y.o. male with possible temporal arteritis. Plan left temporal artery biopsy in [facility] [place] in near future.   CHIEF COMPLAINT: Sided headache and visual disturbance  HISTORY OF PRESENT ILLNESS: Thomas [name] is a 80 y.o. male who presents to clinic for evaluation of temporal artery biopsy.  The patient reports an episode where he was driving to church and experienced double vision and a left-sided headache.  He was able to safely stop without an accident.  He saw his primary care physician and concern for temporal arteritis was raised.  He was started on prednisone.  His headache and visual disturbances resolved.  The patient denies any jaw claudication.  He is otherwise fairly healthy for his age.  Past Medical History:  Diagnosis Date   Arthritis    Bifascicular bundle branch block    Bigeminy 06/2019   Coronary artery disease    takes Plavix and ASA daily   GERD (gastroesophageal reflux disease)    occasional and will take Tums if needed   History of blood clots 2008   in chest   History of kidney stones late 90's   Hyperlipidemia    takes Simvastatin daily   Hypertension    takes Maxzide daily   Hypothyroidism    takes Synthroid daily   Joint pain    Joint swelling    left knee   Pneumonia    at 80 years old, double    Past Surgical History:  Procedure Laterality Date   CARDIAC CATHETERIZATION     CHOLECYSTECTOMY N/A 06/06/2018   Procedure: LAPAROSCOPIC CHOLECYSTECTOMY WITH INTRAOPERATIVE CHOLANGIOGRAM;  Surgeon: Manus Rudd, MD;  Location: [facility];  Service: General;  Laterality: N/A;   CORONARY ANGIOPLASTY  05/21/2007   lad stent/taxus stent   HEMORROIDECTOMY  late 1970's   HERNIA REPAIR Right 1980's   TOTAL HIP ARTHROPLASTY Right 10/28/2013   Procedure: RIGHT TOTAL HIP ARTHROPLASTY ANTERIOR APPROACH;  Surgeon: Loanne Drilling, MD;  Location: [facility];  Service: Orthopedics;  Laterality: Right;   TOTAL HIP  ARTHROPLASTY Left 05/08/2015   Procedure: LEFT TOTAL HIP ARTHROPLASTY ANTERIOR APPROACH;  Surgeon: Ollen Gross, MD;  Location: [facility] OR;  Service: Orthopedics;  Laterality: Left;    Family History  Problem Relation Age of Onset   CAD Mother    Heart failure Mother    Pneumonia Father    Heart failure Father     Social History   Socioeconomic History   Marital status: Married    Spouse name: Not on file   Number of children: Not on file   Years of education: Not on file   Highest education level: Not on file  Occupational History   Not on file  Tobacco Use   Smoking status: Former    Packs/day: 0.25    Years: 52.00    Pack years: 13.00    Types: Cigarettes    Quit date: 2016    Years since quitting: 6.9   Smokeless tobacco: Never   Tobacco comments:    trying to quit  Substance and Sexual Activity   Alcohol use: No   Drug use: No   Sexual activity: Not on file  Other Topics Concern   Not on file  Social History Narrative   Not on file   Social Determinants of Health   Financial Resource Strain: Not on file  Food Insecurity: Not on file  Transportation Needs: Not on file  Physical Activity: Not  on file  Stress: Not on file  Social Connections: Not on file  Intimate Partner Violence: Not on file    Allergies  Allergen Reactions   Oxycodone Nausea Only   Robaxin [Methocarbamol] Nausea Only   Tramadol Nausea Only and Other (See Comments)    Hallucinations    Current Outpatient Medications  Medication Sig Dispense Refill   amiodarone (PACERONE) 200 MG tablet TAKE 1 TABLET BY MOUTH DAILY EXCEPT SUNDAY. DO NOT TAKE ON SUNDAY 90 tablet 3   aspirin EC 81 MG tablet Take 1 tablet (81 mg total) by mouth daily.     clopidogrel (PLAVIX) 75 MG tablet TAKE 1 TABLET BY MOUTH DAILY WITH BREAKFAST 90 tablet 1   levothyroxine (SYNTHROID, LEVOTHROID) 150 MCG tablet Take 150 mcg by mouth daily.     lisinopril (ZESTRIL) 10 MG tablet Take 10 mg by mouth daily.     loratadine  (CLARITIN) 10 MG tablet Take 10 mg by mouth daily as needed for allergies.     simvastatin (ZOCOR) 10 MG tablet Take 10 mg by mouth daily.     No current facility-administered medications for this visit.    REVIEW OF SYSTEMS:  [X]  denotes positive finding, [ ]  denotes negative finding Cardiac  Comments:  Chest pain or chest pressure:    Shortness of breath upon exertion:    Short of breath when lying flat:    Irregular heart rhythm:        Vascular    Pain in calf, thigh, or hip brought on by ambulation:    Pain in feet at night that wakes you up from your sleep:     Blood clot in your veins:    Leg swelling:         Pulmonary    Oxygen at home:    Productive cough:     Wheezing:         Neurologic    Sudden weakness in arms or legs:     Sudden numbness in arms or legs:     Sudden onset of difficulty speaking or slurred speech:    Temporary loss of vision in one eye:     Problems with dizziness:         Gastrointestinal    Blood in stool:     Vomited blood:         Genitourinary    Burning when urinating:     Blood in urine:        Psychiatric    Major depression:         Hematologic    Bleeding problems:    Problems with blood clotting too easily:        Skin    Rashes or ulcers:        Constitutional    Fever or chills:      PHYSICAL EXAM There were no vitals filed for this visit.  Constitutional: Elderly, but well appearing. no distress. Appears well nourished.  Neurologic: CN intact. no focal findings. no sensory loss. Psychiatric:  Mood and affect symmetric and appropriate. Eyes:  No icterus. No conjunctival pallor. Ears, nose, throat:  mucous membranes moist. Midline trachea.  Cardiac: regular rate and rhythm.  Respiratory:  unlabored. Abdominal:  soft, non-tender, non-distended.  Peripheral vascular: palpable temporal pulse bilaterally Extremity: no edema. no cyanosis. no pallor.  Skin: no gangrene. no ulceration.  Lymphatic: no Stemmer's  sign. no palpable lymphadenopathy.  PERTINENT LABORATORY AND RADIOLOGIC DATA  Most recent CBC CBC Latest Ref  Rng & Units 11/03/2019 07/02/2019 07/01/2019  WBC 4.0 - 10.5 K/uL 5.2 5.3 5.6  Hemoglobin 13.0 - 17.0 g/dL 15.5 14.3 15.8  Hematocrit 39.0 - 52.0 % 48.0 43.0 48.1  Platelets 150 - 400 K/uL 205 204 250     Most recent CMP CMP Latest Ref Rng & Units 11/03/2019 09/13/2019 07/16/2019  Glucose 70 - 99 mg/dL 118(H) 96 100(H)  BUN 8 - 23 mg/dL 15 14 10   Creatinine 0.61 - 1.24 mg/dL 1.48(H) 1.58(H) 1.41(H)  Sodium 135 - 145 mmol/L 141 141 143  Potassium 3.5 - 5.1 mmol/L 4.1 3.9 3.8  Chloride 98 - 111 mmol/L 109 103 104  CO2 22 - 32 mmol/L 24 24 22   Calcium 8.9 - 10.3 mg/dL 9.0 9.4 8.9  Total Protein 6.5 - 8.1 g/dL - - -  Total Bilirubin 0.3 - 1.2 mg/dL - - -  Alkaline Phos 38 - 126 U/L - - -  AST 15 - 41 U/L - - -  ALT 0 - 44 U/L - - -   LDL Calculated  Date Value Ref Range Status  04/02/2018 54 0 - 99 mg/dL Final    Yevonne Aline. Stanford Breed, MD [facility] Phone Number: [phone] 12/06/2021 1:08 PM  Total time spent on preparing this encounter including chart review, data review, collecting history, examining the patient, coordinating care for this new patient, 45 minutes.  Portions of this report may have been transcribed using voice recognition software.  Every effort has been made to ensure accuracy; however, inadvertent computerized transcription errors may still be present.

## 2021-12-06 NOTE — H&P (View-Only) (Signed)
[facility] [place]  ASSESSMENT / PLAN: 80 y.o. male with possible temporal arteritis. Plan left temporal artery biopsy in [facility] [place] in near future.   CHIEF COMPLAINT: Sided headache and visual disturbance  HISTORY OF PRESENT ILLNESS: Thomas [name] is a 80 y.o. male who presents to clinic for evaluation of temporal artery biopsy.  The patient reports an episode where he was driving to church and experienced double vision and a left-sided headache.  He was able to safely stop without an accident.  He saw his primary care physician and concern for temporal arteritis was raised.  He was started on prednisone.  His headache and visual disturbances resolved.  The patient denies any jaw claudication.  He is otherwise fairly healthy for his age.  Past Medical History:  Diagnosis Date   Arthritis    Bifascicular bundle branch block    Bigeminy 06/2019   Coronary artery disease    takes Plavix and ASA daily   GERD (gastroesophageal reflux disease)    occasional and will take Tums if needed   History of blood clots 2008   in chest   History of kidney stones late 90's   Hyperlipidemia    takes Simvastatin daily   Hypertension    takes Maxzide daily   Hypothyroidism    takes Synthroid daily   Joint pain    Joint swelling    left knee   Pneumonia    at 80 years old, double    Past Surgical History:  Procedure Laterality Date   CARDIAC CATHETERIZATION     CHOLECYSTECTOMY N/A 06/06/2018   Procedure: LAPAROSCOPIC CHOLECYSTECTOMY WITH INTRAOPERATIVE CHOLANGIOGRAM;  Surgeon: Manus Rudd, MD;  Location: [facility];  Service: General;  Laterality: N/A;   CORONARY ANGIOPLASTY  05/21/2007   lad stent/taxus stent   HEMORROIDECTOMY  late 1970's   HERNIA REPAIR Right 1980's   TOTAL HIP ARTHROPLASTY Right 10/28/2013   Procedure: RIGHT TOTAL HIP ARTHROPLASTY ANTERIOR APPROACH;  Surgeon: Loanne Drilling, MD;  Location: [facility];  Service: Orthopedics;  Laterality: Right;   TOTAL HIP  ARTHROPLASTY Left 05/08/2015   Procedure: LEFT TOTAL HIP ARTHROPLASTY ANTERIOR APPROACH;  Surgeon: Ollen Gross, MD;  Location: [facility] OR;  Service: Orthopedics;  Laterality: Left;    Family History  Problem Relation Age of Onset   CAD Mother    Heart failure Mother    Pneumonia Father    Heart failure Father     Social History   Socioeconomic History   Marital status: Married    Spouse name: Not on file   Number of children: Not on file   Years of education: Not on file   Highest education level: Not on file  Occupational History   Not on file  Tobacco Use   Smoking status: Former    Packs/day: 0.25    Years: 52.00    Pack years: 13.00    Types: Cigarettes    Quit date: 2016    Years since quitting: 6.9   Smokeless tobacco: Never   Tobacco comments:    trying to quit  Substance and Sexual Activity   Alcohol use: No   Drug use: No   Sexual activity: Not on file  Other Topics Concern   Not on file  Social History Narrative   Not on file   Social Determinants of Health   Financial Resource Strain: Not on file  Food Insecurity: Not on file  Transportation Needs: Not on file  Physical Activity: Not  on file  Stress: Not on file  Social Connections: Not on file  Intimate Partner Violence: Not on file    Allergies  Allergen Reactions   Oxycodone Nausea Only   Robaxin [Methocarbamol] Nausea Only   Tramadol Nausea Only and Other (See Comments)    Hallucinations    Current Outpatient Medications  Medication Sig Dispense Refill   amiodarone (PACERONE) 200 MG tablet TAKE 1 TABLET BY MOUTH DAILY EXCEPT SUNDAY. DO NOT TAKE ON SUNDAY 90 tablet 3   aspirin EC 81 MG tablet Take 1 tablet (81 mg total) by mouth daily.     clopidogrel (PLAVIX) 75 MG tablet TAKE 1 TABLET BY MOUTH DAILY WITH BREAKFAST 90 tablet 1   levothyroxine (SYNTHROID, LEVOTHROID) 150 MCG tablet Take 150 mcg by mouth daily.     lisinopril (ZESTRIL) 10 MG tablet Take 10 mg by mouth daily.     loratadine  (CLARITIN) 10 MG tablet Take 10 mg by mouth daily as needed for allergies.     simvastatin (ZOCOR) 10 MG tablet Take 10 mg by mouth daily.     No current facility-administered medications for this visit.    REVIEW OF SYSTEMS:  [X]  denotes positive finding, [ ]  denotes negative finding Cardiac  Comments:  Chest pain or chest pressure:    Shortness of breath upon exertion:    Short of breath when lying flat:    Irregular heart rhythm:        Vascular    Pain in calf, thigh, or hip brought on by ambulation:    Pain in feet at night that wakes you up from your sleep:     Blood clot in your veins:    Leg swelling:         Pulmonary    Oxygen at home:    Productive cough:     Wheezing:         Neurologic    Sudden weakness in arms or legs:     Sudden numbness in arms or legs:     Sudden onset of difficulty speaking or slurred speech:    Temporary loss of vision in one eye:     Problems with dizziness:         Gastrointestinal    Blood in stool:     Vomited blood:         Genitourinary    Burning when urinating:     Blood in urine:        Psychiatric    Major depression:         Hematologic    Bleeding problems:    Problems with blood clotting too easily:        Skin    Rashes or ulcers:        Constitutional    Fever or chills:      PHYSICAL EXAM There were no vitals filed for this visit.  Constitutional: Elderly, but well appearing. no distress. Appears well nourished.  Neurologic: CN intact. no focal findings. no sensory loss. Psychiatric:  Mood and affect symmetric and appropriate. Eyes:  No icterus. No conjunctival pallor. Ears, nose, throat:  mucous membranes moist. Midline trachea.  Cardiac: regular rate and rhythm.  Respiratory:  unlabored. Abdominal:  soft, non-tender, non-distended.  Peripheral vascular: palpable temporal pulse bilaterally Extremity: no edema. no cyanosis. no pallor.  Skin: no gangrene. no ulceration.  Lymphatic: no Stemmer's  sign. no palpable lymphadenopathy.  PERTINENT LABORATORY AND RADIOLOGIC DATA  Most recent CBC CBC Latest Ref  Rng & Units 11/03/2019 07/02/2019 07/01/2019  WBC 4.0 - 10.5 K/uL 5.2 5.3 5.6  Hemoglobin 13.0 - 17.0 g/dL 15.5 14.3 15.8  Hematocrit 39.0 - 52.0 % 48.0 43.0 48.1  Platelets 150 - 400 K/uL 205 204 250     Most recent CMP CMP Latest Ref Rng & Units 11/03/2019 09/13/2019 07/16/2019  Glucose 70 - 99 mg/dL 118(H) 96 100(H)  BUN 8 - 23 mg/dL 15 14 10   Creatinine 0.61 - 1.24 mg/dL 1.48(H) 1.58(H) 1.41(H)  Sodium 135 - 145 mmol/L 141 141 143  Potassium 3.5 - 5.1 mmol/L 4.1 3.9 3.8  Chloride 98 - 111 mmol/L 109 103 104  CO2 22 - 32 mmol/L 24 24 22   Calcium 8.9 - 10.3 mg/dL 9.0 9.4 8.9  Total Protein 6.5 - 8.1 g/dL - - -  Total Bilirubin 0.3 - 1.2 mg/dL - - -  Alkaline Phos 38 - 126 U/L - - -  AST 15 - 41 U/L - - -  ALT 0 - 44 U/L - - -   LDL Calculated  Date Value Ref Range Status  04/02/2018 54 0 - 99 mg/dL Final    Yevonne Aline. Stanford Breed, MD [facility] Phone Number: [phone] 12/06/2021 1:08 PM  Total time spent on preparing this encounter including chart review, data review, collecting history, examining the patient, coordinating care for this new patient, 45 minutes.  Portions of this report may have been transcribed using voice recognition software.  Every effort has been made to ensure accuracy; however, inadvertent computerized transcription errors may still be present.

## 2021-12-07 ENCOUNTER — Other Ambulatory Visit: Payer: Self-pay

## 2021-12-07 ENCOUNTER — Encounter: Payer: Self-pay | Admitting: Vascular Surgery

## 2021-12-07 ENCOUNTER — Ambulatory Visit: Payer: Medicare Other | Admitting: Vascular Surgery

## 2021-12-07 VITALS — BP 140/72 | HR 54 | Temp 98.6°F | Resp 20 | Ht 72.0 in | Wt 205.0 lb

## 2021-12-07 DIAGNOSIS — H539 Unspecified visual disturbance: Secondary | ICD-10-CM

## 2021-12-07 DIAGNOSIS — R519 Headache, unspecified: Secondary | ICD-10-CM

## 2021-12-14 ENCOUNTER — Encounter (HOSPITAL_COMMUNITY): Payer: Self-pay | Admitting: Vascular Surgery

## 2021-12-14 ENCOUNTER — Other Ambulatory Visit: Payer: Self-pay

## 2021-12-14 NOTE — Progress Notes (Addendum)
PCP is Dr. Shelah Lewandowsky Cardiologist is Dr. Eden Emms  Thomas [name] has been instructed to hold Plavix for 5 days prior to surgery, continue ASA, saw in letter from Dr. Verita Lamb office. Patient last dose was 12/03/21- patient reported.  Thomas [name] denies chest pain or shortness of breath. Patient denies having any s/s of Covid in her his exposure to Covid.   I instructed Thomas [name] to shower with antibiotic soap, if it is available.  Dry off with a clean towel. Do not put lotion, powder, cologne or deodorant or makeup.No jewelry or piercings. Men may shave their face and neck. Woman should not shave. No nail polish, artificial or acrylic nails. Wear clean clothes, brush your teeth. Glasses, contact lens,dentures or partials may not be worn in the OR. If you need to wear them, please bring a case for glasses, do not wear contacts or bring a case, the hospital does not have contact cases, dentures or partials will have to be removed , make sure they are clean, we will provide a denture cup to put them in. You will need some one to drive you home and a responsible person over the age of 50 to stay with you for the first 24 hours after surgery.

## 2021-12-15 ENCOUNTER — Encounter (HOSPITAL_COMMUNITY): Payer: Self-pay | Admitting: Vascular Surgery

## 2021-12-15 ENCOUNTER — Other Ambulatory Visit: Payer: Self-pay

## 2021-12-15 ENCOUNTER — Ambulatory Visit (HOSPITAL_COMMUNITY): Payer: Medicare Other | Admitting: Certified Registered"

## 2021-12-15 ENCOUNTER — Encounter (HOSPITAL_COMMUNITY)
Admission: RE | Disposition: A | Discharge status: Home / Self Care | Payer: Self-pay | Source: Home / Self Care | Attending: Vascular Surgery

## 2021-12-15 ENCOUNTER — Ambulatory Visit (HOSPITAL_COMMUNITY)
Admission: RE | Admit: 2021-12-15 | Discharge: 2021-12-15 | Disposition: A | Discharge status: Home / Self Care | Payer: Medicare Other | Attending: Vascular Surgery | Admitting: Vascular Surgery

## 2021-12-15 DIAGNOSIS — I10 Essential (primary) hypertension: Secondary | ICD-10-CM | POA: Insufficient documentation

## 2021-12-15 DIAGNOSIS — E039 Hypothyroidism, unspecified: Secondary | ICD-10-CM | POA: Diagnosis not present

## 2021-12-15 DIAGNOSIS — D759 Disease of blood and blood-forming organs, unspecified: Secondary | ICD-10-CM | POA: Diagnosis not present

## 2021-12-15 DIAGNOSIS — K219 Gastro-esophageal reflux disease without esophagitis: Secondary | ICD-10-CM | POA: Insufficient documentation

## 2021-12-15 DIAGNOSIS — M1990 Unspecified osteoarthritis, unspecified site: Secondary | ICD-10-CM | POA: Insufficient documentation

## 2021-12-15 DIAGNOSIS — Z87891 Personal history of nicotine dependence: Secondary | ICD-10-CM | POA: Insufficient documentation

## 2021-12-15 DIAGNOSIS — I708 Atherosclerosis of other arteries: Secondary | ICD-10-CM | POA: Insufficient documentation

## 2021-12-15 DIAGNOSIS — I2510 Atherosclerotic heart disease of native coronary artery without angina pectoris: Secondary | ICD-10-CM | POA: Diagnosis not present

## 2021-12-15 DIAGNOSIS — D649 Anemia, unspecified: Secondary | ICD-10-CM | POA: Insufficient documentation

## 2021-12-15 DIAGNOSIS — H532 Diplopia: Secondary | ICD-10-CM | POA: Diagnosis not present

## 2021-12-15 DIAGNOSIS — I499 Cardiac arrhythmia, unspecified: Secondary | ICD-10-CM | POA: Diagnosis not present

## 2021-12-15 DIAGNOSIS — N289 Disorder of kidney and ureter, unspecified: Secondary | ICD-10-CM | POA: Diagnosis not present

## 2021-12-15 HISTORY — PX: ARTERY BIOPSY: SHX891

## 2021-12-15 LAB — POCT I-STAT, CHEM 8
BUN: 38 mg/dL — ABNORMAL HIGH (ref 8–23)
Calcium, Ion: 1.18 mmol/L (ref 1.15–1.40)
Chloride: 101 mmol/L (ref 98–111)
Creatinine, Ser: 1.6 mg/dL — ABNORMAL HIGH (ref 0.61–1.24)
Glucose, Bld: 91 mg/dL (ref 70–99)
HCT: 47 % (ref 39.0–52.0)
Hemoglobin: 16 g/dL (ref 13.0–17.0)
Potassium: 3.9 mmol/L (ref 3.5–5.1)
Sodium: 138 mmol/L (ref 135–145)
TCO2: 27 mmol/L (ref 22–32)

## 2021-12-15 SURGERY — BIOPSY TEMPORAL ARTERY
Anesthesia: Monitor Anesthesia Care | Site: Head | Laterality: Left

## 2021-12-15 MED ORDER — SODIUM CHLORIDE 0.9 % IV SOLN: INTRAVENOUS | Status: DC

## 2021-12-15 MED ORDER — FENTANYL CITRATE (PF) 250 MCG/5ML IJ SOLN
INTRAMUSCULAR | Status: DC | PRN
  Administered 2021-12-15: 50 ug via INTRAVENOUS
  Administered 2021-12-15: 100 ug via INTRAVENOUS

## 2021-12-15 MED ORDER — GLYCOPYRROLATE PF 0.2 MG/ML IJ SOSY
PREFILLED_SYRINGE | INTRAMUSCULAR | Status: AC
  Filled 2021-12-15: qty 1

## 2021-12-15 MED ORDER — ACETAMINOPHEN 160 MG/5ML PO SOLN: 1000.0000 mg | Freq: Once | ORAL | Status: DC | PRN

## 2021-12-15 MED ORDER — ACETAMINOPHEN 10 MG/ML IV SOLN: 1000.0000 mg | Freq: Once | INTRAVENOUS | Status: DC | PRN

## 2021-12-15 MED ORDER — OXYCODONE HCL 5 MG PO TABS: 5.0000 mg | ORAL_TABLET | Freq: Once | ORAL | Status: DC | PRN

## 2021-12-15 MED ORDER — CHLORHEXIDINE GLUCONATE 4 % EX LIQD: 60.0000 mL | Freq: Once | CUTANEOUS | Status: DC

## 2021-12-15 MED ORDER — FENTANYL CITRATE (PF) 100 MCG/2ML IJ SOLN: 25.0000 ug | INTRAMUSCULAR | Status: DC | PRN

## 2021-12-15 MED ORDER — LIDOCAINE HCL 1 % IJ SOLN
INTRAMUSCULAR | Status: AC
  Filled 2021-12-15: qty 20

## 2021-12-15 MED ORDER — PHENYLEPHRINE 40 MCG/ML (10ML) SYRINGE FOR IV PUSH (FOR BLOOD PRESSURE SUPPORT)
PREFILLED_SYRINGE | INTRAVENOUS | Status: AC
  Filled 2021-12-15: qty 10

## 2021-12-15 MED ORDER — GLYCOPYRROLATE PF 0.2 MG/ML IJ SOSY
PREFILLED_SYRINGE | INTRAMUSCULAR | Status: DC | PRN
  Administered 2021-12-15: .2 mg via INTRAVENOUS

## 2021-12-15 MED ORDER — PROPOFOL 1000 MG/100ML IV EMUL
INTRAVENOUS | Status: AC
  Filled 2021-12-15: qty 100

## 2021-12-15 MED ORDER — OXYCODONE HCL 5 MG/5ML PO SOLN: 5.0000 mg | Freq: Once | ORAL | Status: DC | PRN

## 2021-12-15 MED ORDER — ONDANSETRON HCL 4 MG/2ML IJ SOLN
INTRAMUSCULAR | Status: AC
  Filled 2021-12-15: qty 2

## 2021-12-15 MED ORDER — CEFAZOLIN SODIUM-DEXTROSE 2-4 GM/100ML-% IV SOLN
INTRAVENOUS | Status: AC
  Filled 2021-12-15: qty 100

## 2021-12-15 MED ORDER — CHLORHEXIDINE GLUCONATE 0.12 % MT SOLN: 15.0000 mL | Freq: Once | OROMUCOSAL | Status: AC

## 2021-12-15 MED ORDER — ACETAMINOPHEN 325 MG PO TABS: 650.0000 mg | ORAL_TABLET | ORAL | Status: DC | PRN

## 2021-12-15 MED ORDER — PHENYLEPHRINE 40 MCG/ML (10ML) SYRINGE FOR IV PUSH (FOR BLOOD PRESSURE SUPPORT)
PREFILLED_SYRINGE | INTRAVENOUS | Status: DC | PRN
  Administered 2021-12-15: 120 ug via INTRAVENOUS

## 2021-12-15 MED ORDER — 0.9 % SODIUM CHLORIDE (POUR BTL) OPTIME
TOPICAL | Status: DC | PRN
Start: 2021-12-15 — End: 2021-12-15
  Administered 2021-12-15: 1000 mL

## 2021-12-15 MED ORDER — LIDOCAINE HCL (PF) 1 % IJ SOLN
INTRAMUSCULAR | Status: DC | PRN
  Administered 2021-12-15: 3 mL

## 2021-12-15 MED ORDER — FENTANYL CITRATE (PF) 250 MCG/5ML IJ SOLN
INTRAMUSCULAR | Status: AC
  Filled 2021-12-15: qty 5

## 2021-12-15 MED ORDER — ORAL CARE MOUTH RINSE: 15.0000 mL | Freq: Once | OROMUCOSAL | Status: AC

## 2021-12-15 MED ORDER — PROPOFOL 10 MG/ML IV BOLUS
INTRAVENOUS | Status: DC | PRN
  Administered 2021-12-15: 80 ug/kg/min via INTRAVENOUS

## 2021-12-15 MED ORDER — ACETAMINOPHEN 500 MG PO TABS: 1000.0000 mg | ORAL_TABLET | Freq: Once | ORAL | Status: DC | PRN

## 2021-12-15 MED ORDER — CHLORHEXIDINE GLUCONATE 0.12 % MT SOLN
OROMUCOSAL | Status: AC
  Administered 2021-12-15: 15 mL via OROMUCOSAL
  Filled 2021-12-15: qty 15

## 2021-12-15 MED ORDER — CEFAZOLIN SODIUM-DEXTROSE 2-4 GM/100ML-% IV SOLN
2.0000 g | INTRAVENOUS | Status: AC
  Administered 2021-12-15: 2 g via INTRAVENOUS

## 2021-12-15 SURGICAL SUPPLY — 48 items
ADH SKN CLS APL DERMABOND .7 (GAUZE/BANDAGES/DRESSINGS) ×1
APL PRP STRL LF DISP 70% ISPRP (MISCELLANEOUS) ×1
APL SKNCLS STERI-STRIP NONHPOA (GAUZE/BANDAGES/DRESSINGS) ×1
BAG COUNTER SPONGE SURGICOUNT (BAG) ×3 IMPLANT
BAG SPNG CNTER NS LX DISP (BAG) ×1
BALL CTTN LRG ABS STRL LF (GAUZE/BANDAGES/DRESSINGS) ×1
BENZOIN TINCTURE PRP APPL 2/3 (GAUZE/BANDAGES/DRESSINGS) ×3 IMPLANT
CANISTER SUCT 3000ML PPV (MISCELLANEOUS) ×3 IMPLANT
CHLORAPREP W/TINT 26 (MISCELLANEOUS) ×3 IMPLANT
CNTNR URN SCR LID CUP LEK RST (MISCELLANEOUS) ×2 IMPLANT
CONT SPEC 4OZ STRL OR WHT (MISCELLANEOUS) ×2
COTTONBALL LRG STERILE PKG (GAUZE/BANDAGES/DRESSINGS) ×3 IMPLANT
COVER PROBE W GEL 5X96 (DRAPES) ×1 IMPLANT
COVER SURGICAL LIGHT HANDLE (MISCELLANEOUS) ×3 IMPLANT
DECANTER SPIKE VIAL GLASS SM (MISCELLANEOUS) ×3 IMPLANT
DERMABOND ADVANCED (GAUZE/BANDAGES/DRESSINGS) ×1
DERMABOND ADVANCED .7 DNX12 (GAUZE/BANDAGES/DRESSINGS) ×2 IMPLANT
DRAPE OPHTHALMIC 77X100 STRL (CUSTOM PROCEDURE TRAY) ×3 IMPLANT
ELECT NDL TIP 2.8 STRL (NEEDLE) IMPLANT
ELECT NEEDLE TIP 2.8 STRL (NEEDLE) ×2 IMPLANT
ELECT REM PT RETURN 9FT ADLT (ELECTROSURGICAL) ×2
ELECTRODE REM PT RTRN 9FT ADLT (ELECTROSURGICAL) ×2 IMPLANT
GAUZE 4X4 16PLY ~~LOC~~+RFID DBL (SPONGE) ×3 IMPLANT
GEL ULTRASOUND 8.5O AQUASONIC (MISCELLANEOUS) ×3 IMPLANT
GLOVE SURG POLYISO LF SZ8 (GLOVE) ×3 IMPLANT
GOWN STRL REUS W/ TWL LRG LVL3 (GOWN DISPOSABLE) ×2 IMPLANT
GOWN STRL REUS W/ TWL XL LVL3 (GOWN DISPOSABLE) ×2 IMPLANT
GOWN STRL REUS W/TWL LRG LVL3 (GOWN DISPOSABLE) ×2
GOWN STRL REUS W/TWL XL LVL3 (GOWN DISPOSABLE) ×2
KIT BASIN OR (CUSTOM PROCEDURE TRAY) ×3 IMPLANT
KIT TURNOVER KIT B (KITS) ×3 IMPLANT
LOOP VESSEL MINI RED (MISCELLANEOUS) ×3 IMPLANT
NDL HYPO 25GX1X1/2 BEV (NEEDLE) ×2 IMPLANT
NEEDLE HYPO 25GX1X1/2 BEV (NEEDLE) ×2 IMPLANT
NS IRRIG 1000ML POUR BTL (IV SOLUTION) ×3 IMPLANT
PACK GENERAL/GYN (CUSTOM PROCEDURE TRAY) ×3 IMPLANT
PAD ARMBOARD 7.5X6 YLW CONV (MISCELLANEOUS) ×6 IMPLANT
SUCTION FRAZIER HANDLE 10FR (MISCELLANEOUS) ×2
SUCTION TUBE FRAZIER 10FR DISP (MISCELLANEOUS) ×2 IMPLANT
SUT MNCRL AB 4-0 PS2 18 (SUTURE) ×3 IMPLANT
SUT PROLENE 6 0 BV (SUTURE) IMPLANT
SUT SILK 3 0 (SUTURE)
SUT SILK 3-0 18XBRD TIE 12 (SUTURE) IMPLANT
SUT VIC AB 3-0 SH 27 (SUTURE) ×2
SUT VIC AB 3-0 SH 27X BRD (SUTURE) ×2 IMPLANT
SYR CONTROL 10ML LL (SYRINGE) ×3 IMPLANT
TOWEL GREEN STERILE (TOWEL DISPOSABLE) ×3 IMPLANT
WATER STERILE IRR 1000ML POUR (IV SOLUTION) ×3 IMPLANT

## 2021-12-15 NOTE — Interval H&P Note (Signed)
History and Physical Interval Note:  12/15/2021 10:24 AM  Thomas [name]  has presented today for surgery, with the diagnosis of Possible temporal arteritis.  The various methods of treatment have been discussed with the patient and family. After consideration of risks, benefits and other options for treatment, the patient has consented to  Procedure(s): LEFT TEMPORAL ARTERY BIOPSY (Left) as a surgical intervention.  The patient's history has been reviewed, patient examined, no change in status, stable for surgery.  I have reviewed the patient's chart and labs.  Questions were answered to the patient's satisfaction.     Leonie Douglas

## 2021-12-15 NOTE — Op Note (Signed)
DATE OF SERVICE: 12/15/2021  PATIENT:  Thomas [name]  81 y.o. male  PRE-OPERATIVE DIAGNOSIS:  possible temporal arteritis  POST-OPERATIVE DIAGNOSIS:  Same  PROCEDURE:   Left temporal artery biopsy  SURGEON:  Surgeon(s) and Role:    * Cherre Robins, MD - Primary  ASSISTANT: none  ANESTHESIA:   local and MAC  EBL: minimal  BLOOD ADMINISTERED:none  DRAINS: none   LOCAL MEDICATIONS USED:  LIDOCAINE   SPECIMEN:  left temporal artery  COUNTS: confirmed correct.  TOURNIQUET:  none  PATIENT DISPOSITION:  PACU - hemodynamically stable.   Delay start of Pharmacological VTE agent (>24hrs) due to surgical blood loss or risk of bleeding: no  INDICATION FOR PROCEDURE: Thomas [name] is a 81 y.o. male with possible temporal arteritis. After careful discussion of risks, benefits, and alternatives the patient was offered temporal artery biopsy. The patient  understood and wished to proceed.  OPERATIVE FINDINGS: small temporal artery biopsied in three segments.  DESCRIPTION OF PROCEDURE: After identification of the patient in the pre-operative holding area, the patient was transferred to the operating room. The patient was positioned supine on the operating room table. Anesthesia was induced. The left temple was prepped and draped in standard fashion. A surgical pause was performed confirming correct patient, procedure, and operative location.  Intraoperative ultrasound was used to map the course of the temporal artery.  An incision was planned over the left temporal artery and carried down through subcutaneous tissue with a 15 blade.  Exposure was continued with Bovie electrocautery.  The pulsatile temporal artery was identified and skeletonized.  I stripped a small segment of the artery and so harvested more.  In total 3 segments of temporal artery were passed off the field for permanent pathologic evaluation.  The distal ends of the temporal artery were ligated with silk ligature.   Good hemostasis was noted. The wound was closed in layers using 3-0 Vicryl and 4-0 Monocryl.  Upon completion of the case instrument and sharps counts were confirmed correct. The patient was transferred to the PACU in good condition. I was present for all portions of the procedure.  Yevonne Aline. Stanford Breed, MD [facility] Phone Number: [phone] 12/15/2021 11:15 AM

## 2021-12-15 NOTE — Anesthesia Preprocedure Evaluation (Signed)
Anesthesia Evaluation  Patient identified by MRN, date of birth, ID band Patient awake    Reviewed: Allergy & Precautions, NPO status , Patient's Chart, lab work & pertinent test results  History of Anesthesia Complications Negative for: history of anesthetic complications  Airway Mallampati: III  TM Distance: >3 FB Neck ROM: Full    Dental  (+) Teeth Intact, Missing, Dental Advisory Given,    Pulmonary neg shortness of breath, neg sleep apnea, neg COPD, neg recent URI, former smoker,    breath sounds clear to auscultation       Cardiovascular hypertension, (-) angina+ CAD and + Cardiac Stents  + dysrhythmias  Rhythm:Regular     Neuro/Psych negative neurological ROS  negative psych ROS   GI/Hepatic Neg liver ROS, GERD  ,  Endo/Other  Hypothyroidism   Renal/GU Renal InsufficiencyRenal diseaseLab Results      Component                Value               Date                      CREATININE               1.60 (H)            12/15/2021                Musculoskeletal  (+) Arthritis ,   Abdominal   Peds  Hematology  (+) Blood dyscrasia, anemia , Lab Results      Component                Value               Date                      WBC                      5.2                 11/03/2019                HGB                      16.0                12/15/2021                HCT                      47.0                12/15/2021                MCV                      107.1 (H)           11/03/2019                PLT                      205                 11/03/2019            plavix   Anesthesia Other Findings   Reproductive/Obstetrics  Anesthesia Physical Anesthesia Plan  ASA: 3  Anesthesia Plan: MAC   Post-op Pain Management: Minimal or no pain anticipated   Induction:   PONV Risk Score and Plan: 1 and Propofol infusion and Treatment may vary due to age  or medical condition  Airway Management Planned: Nasal Cannula  Additional Equipment: None  Intra-op Plan:   Post-operative Plan:   Informed Consent: I have reviewed the patients History and Physical, chart, labs and discussed the procedure including the risks, benefits and alternatives for the proposed anesthesia with the patient or authorized representative who has indicated his/her understanding and acceptance.     Dental advisory given  Plan Discussed with: CRNA and Anesthesiologist  Anesthesia Plan Comments:         Anesthesia Quick Evaluation

## 2021-12-15 NOTE — Transfer of Care (Signed)
Immediate Anesthesia Transfer of Care Note  Patient: Thomas [name]  Procedure(s) Performed: LEFT TEMPORAL ARTERY BIOPSY (Left: Head)  Patient Location: PACU  Anesthesia Type:MAC  Level of Consciousness: awake, alert  and oriented  Airway & Oxygen Therapy: Patient connected to nasal cannula oxygen  Post-op Assessment: Post -op Vital signs reviewed and stable  Post vital signs: stable  Last Vitals:  Vitals Value Taken Time  BP 152/73 12/15/21 1121  Temp    Pulse 61 12/15/21 1123  Resp 23 12/15/21 1123  SpO2 99 % 12/15/21 1123  Vitals shown include unvalidated device data.  Last Pain:  Vitals:   12/15/21 0824  TempSrc: Oral  PainSc: 0-No pain         Complications: No notable events documented.

## 2021-12-16 ENCOUNTER — Encounter (HOSPITAL_COMMUNITY): Payer: Self-pay | Admitting: Vascular Surgery

## 2021-12-16 LAB — SURGICAL PATHOLOGY

## 2021-12-16 NOTE — Anesthesia Postprocedure Evaluation (Signed)
Anesthesia Post Note  Patient: Thomas [name]  Procedure(s) Performed: LEFT TEMPORAL ARTERY BIOPSY (Left: Head)     Patient location during evaluation: PACU Anesthesia Type: MAC Level of consciousness: awake and alert Pain management: pain level controlled Vital Signs Assessment: post-procedure vital signs reviewed and stable Respiratory status: spontaneous breathing, nonlabored ventilation, respiratory function stable and patient connected to nasal cannula oxygen Cardiovascular status: stable and blood pressure returned to baseline Postop Assessment: no apparent nausea or vomiting Anesthetic complications: no   No notable events documented.  Last Vitals:  Vitals:   12/15/21 1136 12/15/21 1152  BP: (!) 143/83 (!) 154/83  Pulse: 65 61  Resp: 15 19  Temp:  37 C  SpO2: 97% 98%    Last Pain:  Vitals:   12/15/21 1152  TempSrc:   PainSc: 0-No pain                 Gavino Fouch

## 2022-03-12 ENCOUNTER — Other Ambulatory Visit: Payer: Self-pay | Admitting: Cardiovascular Disease

## 2022-03-24 ENCOUNTER — Encounter (HOSPITAL_COMMUNITY): Payer: Self-pay

## 2022-03-24 ENCOUNTER — Emergency Department (HOSPITAL_COMMUNITY): Payer: Medicare Other

## 2022-03-24 ENCOUNTER — Other Ambulatory Visit: Payer: Self-pay

## 2022-03-24 ENCOUNTER — Emergency Department (HOSPITAL_COMMUNITY)
Admission: EM | Admit: 2022-03-24 | Discharge: 2022-03-25 | Disposition: A | Discharge status: Home / Self Care | Payer: Medicare Other | Attending: Emergency Medicine | Admitting: Emergency Medicine

## 2022-03-24 DIAGNOSIS — R109 Unspecified abdominal pain: Secondary | ICD-10-CM | POA: Diagnosis present

## 2022-03-24 DIAGNOSIS — Z7982 Long term (current) use of aspirin: Secondary | ICD-10-CM | POA: Insufficient documentation

## 2022-03-24 DIAGNOSIS — N201 Calculus of ureter: Secondary | ICD-10-CM

## 2022-03-24 DIAGNOSIS — N132 Hydronephrosis with renal and ureteral calculous obstruction: Secondary | ICD-10-CM | POA: Diagnosis not present

## 2022-03-24 DIAGNOSIS — N179 Acute kidney failure, unspecified: Secondary | ICD-10-CM | POA: Diagnosis not present

## 2022-03-24 DIAGNOSIS — Z7902 Long term (current) use of antithrombotics/antiplatelets: Secondary | ICD-10-CM | POA: Insufficient documentation

## 2022-03-24 LAB — CBC WITH DIFFERENTIAL/PLATELET
Abs Immature Granulocytes: 0.04 10*3/uL (ref 0.00–0.07)
Basophils Absolute: 0.1 10*3/uL (ref 0.0–0.1)
Basophils Relative: 1 %
Eosinophils Absolute: 0.1 10*3/uL (ref 0.0–0.5)
Eosinophils Relative: 2 %
HCT: 38.9 % — ABNORMAL LOW (ref 39.0–52.0)
Hemoglobin: 12.6 g/dL — ABNORMAL LOW (ref 13.0–17.0)
Immature Granulocytes: 1 %
Lymphocytes Relative: 26 %
Lymphs Abs: 2 10*3/uL (ref 0.7–4.0)
MCH: 36.2 pg — ABNORMAL HIGH (ref 26.0–34.0)
MCHC: 32.4 g/dL (ref 30.0–36.0)
MCV: 111.8 fL — ABNORMAL HIGH (ref 80.0–100.0)
Monocytes Absolute: 0.9 10*3/uL (ref 0.1–1.0)
Monocytes Relative: 11 %
Neutro Abs: 4.8 10*3/uL (ref 1.7–7.7)
Neutrophils Relative %: 59 %
Platelets: 284 10*3/uL (ref 150–400)
RBC: 3.48 MIL/uL — ABNORMAL LOW (ref 4.22–5.81)
RDW: 14.2 % (ref 11.5–15.5)
WBC: 7.9 10*3/uL (ref 4.0–10.5)
nRBC: 0 % (ref 0.0–0.2)

## 2022-03-24 LAB — BASIC METABOLIC PANEL
Anion gap: 8 (ref 5–15)
BUN: 17 mg/dL (ref 8–23)
CO2: 24 mmol/L (ref 22–32)
Calcium: 8.8 mg/dL — ABNORMAL LOW (ref 8.9–10.3)
Chloride: 107 mmol/L (ref 98–111)
Creatinine, Ser: 1.64 mg/dL — ABNORMAL HIGH (ref 0.61–1.24)
GFR, Estimated: 42 mL/min — ABNORMAL LOW (ref >60)
Glucose, Bld: 126 mg/dL — ABNORMAL HIGH (ref 70–99)
Potassium: 3.1 mmol/L — ABNORMAL LOW (ref 3.5–5.1)
Sodium: 139 mmol/L (ref 135–145)

## 2022-03-24 LAB — URINALYSIS, ROUTINE W REFLEX MICROSCOPIC
Bacteria, UA: NONE SEEN
Bilirubin Urine: NEGATIVE
Glucose, UA: NEGATIVE mg/dL
Ketones, ur: NEGATIVE mg/dL
Nitrite: NEGATIVE
Protein, ur: 100 mg/dL — AB
RBC / HPF: >50 RBC/hpf — ABNORMAL HIGH (ref 0–5)
Specific Gravity, Urine: 1.025 (ref 1.005–1.030)
pH: 5 (ref 5.0–8.0)

## 2022-03-24 NOTE — ED Provider Triage Note (Signed)
Emergency Medicine Provider Triage Evaluation Note ? ?Thomas [name] , a 81 y.o. male  was evaluated in triage.  Pt complains of right flank pain.  Feels like he has kidney stones.  History of same, passed a small 1 to 2 weeks ago.  Does have some hematuria.  Denies any nausea or vomiting.  No fever. ? ?Review of Systems  ?Positive: Flank pain, hematuria ?Negative: fever ? ?Physical Exam  ?BP (!) 166/91 (BP Location: Left Arm)   Pulse 65   Temp 97.7 ?F (36.5 ?C) (Oral)   Resp 18   SpO2 97%  ?Gen:   Awake, no distress   ?Resp:  Normal effort  ?MSK:   Moves extremities without difficulty  ?Other:   ? ?Medical Decision Making  ?Medically screening exam initiated at 10:46 PM.  Appropriate orders placed.  Thomas [name] was informed that the remainder of the evaluation will be completed by another provider, this initial triage assessment does not replace that evaluation, and the importance of remaining in the ED until their evaluation is complete. ? ?Flank pain.  Suspect kidney stones, hx of same.  Labs, UA, renal stone study. ?  ?Garlon Hatchet, PA-C ?03/24/22 2247 ? ?

## 2022-03-24 NOTE — ED Triage Notes (Signed)
Pt reports RLQ abd pain that radiates to right flank area associated with hematuria. Hx of kidney stones and states this feels similar. Deneis n/v/d ?

## 2022-03-25 MED ORDER — SODIUM CHLORIDE 0.9 % IV BOLUS
1000.0000 mL | Freq: Once | INTRAVENOUS | Status: AC
  Administered 2022-03-25: 1000 mL via INTRAVENOUS

## 2022-03-25 MED ORDER — ONDANSETRON HCL 4 MG/2ML IJ SOLN: 4.0000 mg | Freq: Once | INTRAMUSCULAR | Status: DC

## 2022-03-25 MED ORDER — ONDANSETRON HCL 4 MG/2ML IJ SOLN
4.0000 mg | Freq: Once | INTRAMUSCULAR | Status: AC
  Administered 2022-03-25: 4 mg via INTRAVENOUS
  Filled 2022-03-25: qty 2

## 2022-03-25 MED ORDER — ONDANSETRON HCL 4 MG PO TABS: 4.0000 mg | ORAL_TABLET | Freq: Four times a day (QID) | ORAL | 0 refills | Status: DC

## 2022-03-25 MED ORDER — HYDROCODONE-ACETAMINOPHEN 5-325 MG PO TABS: 1.0000 | ORAL_TABLET | Freq: Four times a day (QID) | ORAL | 0 refills | Status: DC | PRN

## 2022-03-25 MED ORDER — KETOROLAC TROMETHAMINE 30 MG/ML IJ SOLN
30.0000 mg | Freq: Once | INTRAMUSCULAR | Status: AC
  Administered 2022-03-25: 30 mg via INTRAMUSCULAR
  Filled 2022-03-25: qty 1

## 2022-03-25 MED ORDER — HYDROCODONE-ACETAMINOPHEN 5-325 MG PO TABS
1.0000 | ORAL_TABLET | Freq: Once | ORAL | Status: AC
  Administered 2022-03-25: 1 via ORAL
  Filled 2022-03-25: qty 1

## 2022-03-25 MED ORDER — TAMSULOSIN HCL 0.4 MG PO CAPS: 0.4000 mg | ORAL_CAPSULE | Freq: Every day | ORAL | 0 refills | Status: DC

## 2022-03-25 NOTE — ED Provider Notes (Signed)
?[facility] ?Provider Note ? ? ?CSN: [id number] ?Arrival date & time: 03/24/22  2225 ? ?  ? ?History ? ?Chief Complaint  ?Patient presents with  ? Abdominal Pain  ? ? ?Thomas [name] is a 81 y.o. male. ? ?Pt is a 81 yo male presenting for right sided flank pain that radiates to the right abdomen that is described as severe, associated with nausea/vomiting, that started last night. Hx of renal stones. No prior hx of lithotripsy or stents. No urologist. Admits to passing small stone then hematuria.  ? ?The history is provided by the patient. No language interpreter was used.  ?Abdominal Pain ?Associated symptoms: nausea   ?Associated symptoms: no chest pain, no chills, no cough, no dysuria, no fever, no hematuria, no shortness of breath, no sore throat and no vomiting   ? ?  ? ?Home Medications ?Prior to Admission medications   ?Medication Sig Start Date End Date Taking? Authorizing Provider  ?amiodarone (PACERONE) 200 MG tablet TAKE 1 TABLET BY MOUTH DAILY EXCEPT SUNDAY. DO NOT TAKE ON SUNDAY ?Patient taking differently: Take 200 mg by mouth See admin instructions. 200mg  daily except Sunday 09/27/21  Yes Evans Lance, MD  ?aspirin EC 81 MG tablet Take 1 tablet (81 mg total) by mouth daily. 04/02/18  Yes Nahser, Wonda Cheng, MD  ?AZELASTINE HCL NA Place 1 spray into the nose daily as needed (congestion).   Yes [provider]  ?clopidogrel (PLAVIX) 75 MG tablet TAKE 1 TABLET BY MOUTH DAILY WITH BREAKFAST ?Patient taking differently: Take 75 mg by mouth daily. 03/14/22  Yes Nahser, Wonda Cheng, MD  ?lisinopril (ZESTRIL) 10 MG tablet Take 10 mg by mouth daily. 05/21/20  Yes [provider]  ?rosuvastatin (CRESTOR) 10 MG tablet Take 10 mg by mouth daily.   Yes [provider]  ?VITAMIN D, CHOLECALCIFEROL, PO Take 1 capsule by mouth daily.   Yes [provider]  ?acetaminophen (TYLENOL) 325 MG tablet Take 2 tablets (650 mg total) by mouth every 4 (four)  hours as needed. ?Patient not taking: Reported on 03/25/2022 12/15/21 12/15/22  Ulyses Amor, PA-C  ?levothyroxine (SYNTHROID, LEVOTHROID) 150 MCG tablet Take 150 mcg by mouth daily. 01/04/18   [provider]  ?   ? ?Allergies    ?Robaxin [methocarbamol], Roxicodone [oxycodone], and Ultram [tramadol]   ? ?Review of Systems   ?Review of Systems  ?Constitutional:  Negative for chills and fever.  ?HENT:  Negative for ear pain and sore throat.   ?Eyes:  Negative for pain and visual disturbance.  ?Respiratory:  Negative for cough and shortness of breath.   ?Cardiovascular:  Negative for chest pain and palpitations.  ?Gastrointestinal:  Positive for abdominal pain and nausea. Negative for vomiting.  ?Genitourinary:  Positive for flank pain. Negative for dysuria and hematuria.  ?Musculoskeletal:  Negative for arthralgias and back pain.  ?Skin:  Negative for color change and rash.  ?Neurological:  Negative for seizures and syncope.  ?All other systems reviewed and are negative. ? ?Physical Exam ?Updated Vital Signs ?BP (!) 179/95 (BP Location: Left Arm)   Pulse 62   Temp 97.7 ?F (36.5 ?C) (Oral)   Resp 14   SpO2 99%  ?Physical Exam ?Vitals and nursing note reviewed.  ?Constitutional:   ?   General: He is not in acute distress. ?   Appearance: He is well-developed.  ?HENT:  ?   Head: Normocephalic and atraumatic.  ?Eyes:  ?   Conjunctiva/sclera: Conjunctivae normal.  ?  Cardiovascular:  ?   Rate and Rhythm: Normal rate and regular rhythm.  ?   Heart sounds: No murmur heard. ?Pulmonary:  ?   Effort: Pulmonary effort is normal. No respiratory distress.  ?   Breath sounds: Normal breath sounds.  ?Abdominal:  ?   Palpations: Abdomen is soft.  ?   Tenderness: There is no abdominal tenderness.  ?Musculoskeletal:     ?   General: No swelling.  ?   Cervical back: Neck supple.  ?Skin: ?   General: Skin is warm and dry.  ?   Capillary Refill: Capillary refill takes less than 2 seconds.  ?Neurological:  ?   Mental Status: He  is alert.  ?Psychiatric:     ?   Mood and Affect: Mood normal.  ? ? ?ED Results / Procedures / Treatments   ?Labs ?(all labs ordered are listed, but only abnormal results are displayed) ?Labs Reviewed  ?CBC WITH DIFFERENTIAL/PLATELET - Abnormal; Notable for the following components:  ?    Result Value  ? RBC 3.48 (*)   ? Hemoglobin 12.6 (*)   ? HCT 38.9 (*)   ? MCV 111.8 (*)   ? MCH 36.2 (*)   ? All other components within normal limits  ?BASIC METABOLIC PANEL - Abnormal; Notable for the following components:  ? Potassium 3.1 (*)   ? Glucose, Bld 126 (*)   ? Creatinine, Ser 1.64 (*)   ? Calcium 8.8 (*)   ? GFR, Estimated 42 (*)   ? All other components within normal limits  ?URINALYSIS, ROUTINE W REFLEX MICROSCOPIC - Abnormal; Notable for the following components:  ? Color, Urine AMBER (*)   ? APPearance CLOUDY (*)   ? Hgb urine dipstick LARGE (*)   ? Protein, ur 100 (*)   ? Leukocytes,Ua SMALL (*)   ? RBC / HPF >50 (*)   ? All other components within normal limits  ? ? ?EKG ?None ? ?Radiology ?CT Renal Stone Study ? ?Result Date: 03/24/2022 ?CLINICAL DATA:  Right flank pain. EXAM: CT ABDOMEN AND PELVIS WITHOUT CONTRAST TECHNIQUE: Multidetector CT imaging of the abdomen and pelvis was performed following the standard protocol without IV contrast. RADIATION DOSE REDUCTION: This exam was performed according to the departmental dose-optimization program which includes automated exposure control, adjustment of the mA and/or kV according to patient size and/or use of iterative reconstruction technique. COMPARISON:  CT abdomen and pelvis 06/04/2018. FINDINGS: Lower chest: There is atelectasis in the lung bases. Focal ground-glass nodular density in the left lower lobe measuring 13 mm is indeterminate, possibly infectious/inflammatory. Hepatobiliary: No focal liver abnormality is seen. Status post cholecystectomy. No biliary dilatation. Pancreas: Unremarkable. No pancreatic ductal dilatation or surrounding inflammatory  changes. Spleen: Normal in size without focal abnormality. Adrenals/Urinary Tract: There is a 5 mm calculus in the mid right ureter. There is mild right-sided hydronephrosis with mild perinephric fat stranding. There are additional punctate nonobstructing bilateral renal calculi. Adrenal glands are within normal limits. Bladder is grossly within normal limits. Stomach/Bowel: Stomach is within normal limits. Appendix appears normal. No evidence of bowel wall thickening, distention, or inflammatory changes. There are scattered colonic diverticula. Vascular/Lymphatic: There is a 3.1 cm abdominal aortic aneurysm, new from prior. There are atherosclerotic calcifications of the aorta. No enlarged lymph nodes are seen. Reproductive: Prostate gland is enlarged, unchanged. Other: No abdominal wall hernia or abnormality. No abdominopelvic ascites. 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 There is stable grade 1 anterolisthesis at L5 on  S1. Severe multilevel degenerative changes affect the spine. 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 IMPRESSION: 1. 5 mm calculus in the mid right ureter with mild obstructive uropathy. 2. Additional nonobstructing bilateral renal calculi. 3. 3.1 cm abdominal aortic aneurysm. Recommend follow-up every 3 years. Reference: J Am Coll Radiol [id number]. 4. 13 mm ground-glass nodular density in the left lower lobe may be infectious/inflammatory, but is indeterminate. Initial follow-up with CT at 6 months is recommended to confirm persistence. If persistent, repeat CT is recommended every 2 years until 5 years of stability has been established. This recommendation follows the consensus statement: Guidelines for Management of Incidental Pulmonary Nodules Detected on CT Images: From the Fleischner Society 2017; Radiology 2017; 284:228-243. Electronically Signed   By: Ronney Asters M.D.   On: 03/24/2022 23:26   ? ?Procedures ?Procedures  ? ? ?Medications  Ordered in ED ?Medications  ?sodium chloride 0.9 % bolus 1,000 mL (has no administration in time range)  ?ondansetron (ZOFRAN) injection 4 mg (has no administration in time range)  ?ketorolac (TORADOL) 30 MG/ML inje

## 2022-03-29 ENCOUNTER — Encounter (HOSPITAL_COMMUNITY): Payer: Self-pay | Admitting: Urology

## 2022-03-29 ENCOUNTER — Encounter (HOSPITAL_COMMUNITY)
Admission: RE | Disposition: A | Discharge status: Home / Self Care | Payer: Self-pay | Source: Ambulatory Visit | Attending: Urology

## 2022-03-29 ENCOUNTER — Inpatient Hospital Stay (HOSPITAL_COMMUNITY): Payer: Medicare Other | Admitting: Anesthesiology

## 2022-03-29 ENCOUNTER — Inpatient Hospital Stay (HOSPITAL_COMMUNITY): Payer: Medicare Other

## 2022-03-29 ENCOUNTER — Other Ambulatory Visit: Payer: Self-pay | Admitting: Urology

## 2022-03-29 ENCOUNTER — Inpatient Hospital Stay (HOSPITAL_COMMUNITY)
Admission: RE | Admit: 2022-03-29 | Discharge: 2022-04-01 | DRG: 660 | Disposition: A | Discharge status: Home / Self Care | Payer: Medicare Other | Source: Ambulatory Visit | Attending: Urology | Admitting: Urology

## 2022-03-29 ENCOUNTER — Other Ambulatory Visit: Payer: Self-pay

## 2022-03-29 DIAGNOSIS — N390 Urinary tract infection, site not specified: Principal | ICD-10-CM | POA: Diagnosis present

## 2022-03-29 DIAGNOSIS — Z1623 Resistance to quinolones and fluoroquinolones: Secondary | ICD-10-CM | POA: Diagnosis present

## 2022-03-29 DIAGNOSIS — E039 Hypothyroidism, unspecified: Secondary | ICD-10-CM

## 2022-03-29 DIAGNOSIS — I2510 Atherosclerotic heart disease of native coronary artery without angina pectoris: Secondary | ICD-10-CM | POA: Diagnosis not present

## 2022-03-29 DIAGNOSIS — Z87442 Personal history of urinary calculi: Secondary | ICD-10-CM

## 2022-03-29 DIAGNOSIS — B957 Other staphylococcus as the cause of diseases classified elsewhere: Secondary | ICD-10-CM | POA: Diagnosis present

## 2022-03-29 DIAGNOSIS — Z8249 Family history of ischemic heart disease and other diseases of the circulatory system: Secondary | ICD-10-CM

## 2022-03-29 DIAGNOSIS — Z1624 Resistance to multiple antibiotics: Secondary | ICD-10-CM | POA: Diagnosis present

## 2022-03-29 DIAGNOSIS — I10 Essential (primary) hypertension: Secondary | ICD-10-CM

## 2022-03-29 DIAGNOSIS — E7800 Pure hypercholesterolemia, unspecified: Secondary | ICD-10-CM | POA: Diagnosis present

## 2022-03-29 DIAGNOSIS — Z79899 Other long term (current) drug therapy: Secondary | ICD-10-CM | POA: Diagnosis not present

## 2022-03-29 DIAGNOSIS — Z7982 Long term (current) use of aspirin: Secondary | ICD-10-CM

## 2022-03-29 DIAGNOSIS — N136 Pyonephrosis: Principal | ICD-10-CM | POA: Diagnosis present

## 2022-03-29 DIAGNOSIS — Z87891 Personal history of nicotine dependence: Secondary | ICD-10-CM

## 2022-03-29 DIAGNOSIS — Z7902 Long term (current) use of antithrombotics/antiplatelets: Secondary | ICD-10-CM | POA: Diagnosis not present

## 2022-03-29 DIAGNOSIS — N201 Calculus of ureter: Secondary | ICD-10-CM | POA: Diagnosis not present

## 2022-03-29 DIAGNOSIS — Z888 Allergy status to other drugs, medicaments and biological substances status: Secondary | ICD-10-CM | POA: Diagnosis not present

## 2022-03-29 DIAGNOSIS — Z885 Allergy status to narcotic agent status: Secondary | ICD-10-CM

## 2022-03-29 HISTORY — PX: CYSTOSCOPY WITH RETROGRADE PYELOGRAM, URETEROSCOPY AND STENT PLACEMENT: SHX5789

## 2022-03-29 LAB — CBC
HCT: 38.3 % — ABNORMAL LOW (ref 39.0–52.0)
Hemoglobin: 12.5 g/dL — ABNORMAL LOW (ref 13.0–17.0)
MCH: 35.3 pg — ABNORMAL HIGH (ref 26.0–34.0)
MCHC: 32.6 g/dL (ref 30.0–36.0)
MCV: 108.2 fL — ABNORMAL HIGH (ref 80.0–100.0)
Platelets: 286 10*3/uL (ref 150–400)
RBC: 3.54 MIL/uL — ABNORMAL LOW (ref 4.22–5.81)
RDW: 13.6 % (ref 11.5–15.5)
WBC: 9.5 10*3/uL (ref 4.0–10.5)
nRBC: 0 % (ref 0.0–0.2)

## 2022-03-29 LAB — BASIC METABOLIC PANEL
Anion gap: 11 (ref 5–15)
BUN: 19 mg/dL (ref 8–23)
CO2: 27 mmol/L (ref 22–32)
Calcium: 9.1 mg/dL (ref 8.9–10.3)
Chloride: 99 mmol/L (ref 98–111)
Creatinine, Ser: 1.76 mg/dL — ABNORMAL HIGH (ref 0.61–1.24)
GFR, Estimated: 39 mL/min — ABNORMAL LOW (ref >60)
Glucose, Bld: 102 mg/dL — ABNORMAL HIGH (ref 70–99)
Potassium: 3.1 mmol/L — ABNORMAL LOW (ref 3.5–5.1)
Sodium: 137 mmol/L (ref 135–145)

## 2022-03-29 SURGERY — CYSTOURETEROSCOPY, WITH RETROGRADE PYELOGRAM AND STENT INSERTION
Anesthesia: General | Site: Renal | Laterality: Right

## 2022-03-29 MED ORDER — HYDROCODONE-ACETAMINOPHEN 5-325 MG PO TABS
1.0000 | ORAL_TABLET | ORAL | Status: DC | PRN
  Administered 2022-03-30 (×2): 1 via ORAL
  Filled 2022-03-29 (×2): qty 1

## 2022-03-29 MED ORDER — FENTANYL CITRATE (PF) 100 MCG/2ML IJ SOLN
INTRAMUSCULAR | Status: AC
Start: 2022-03-29 — End: ?
  Filled 2022-03-29: qty 2

## 2022-03-29 MED ORDER — DOCUSATE SODIUM 100 MG PO CAPS
100.0000 mg | ORAL_CAPSULE | Freq: Two times a day (BID) | ORAL | Status: DC
  Administered 2022-03-29 – 2022-04-01 (×6): 100 mg via ORAL
  Filled 2022-03-29 (×6): qty 1

## 2022-03-29 MED ORDER — ACETAMINOPHEN 10 MG/ML IV SOLN
INTRAVENOUS | Status: AC
  Filled 2022-03-29: qty 100

## 2022-03-29 MED ORDER — OXYBUTYNIN CHLORIDE 5 MG PO TABS: 5.0000 mg | ORAL_TABLET | Freq: Three times a day (TID) | ORAL | Status: DC | PRN

## 2022-03-29 MED ORDER — DIPHENHYDRAMINE HCL 50 MG/ML IJ SOLN: 12.5000 mg | Freq: Four times a day (QID) | INTRAMUSCULAR | Status: DC | PRN

## 2022-03-29 MED ORDER — LIDOCAINE 2% (20 MG/ML) 5 ML SYRINGE
INTRAMUSCULAR | Status: DC | PRN
  Administered 2022-03-29: 100 mg via INTRAVENOUS

## 2022-03-29 MED ORDER — ACETAMINOPHEN 325 MG PO TABS: 650.0000 mg | ORAL_TABLET | ORAL | Status: DC | PRN

## 2022-03-29 MED ORDER — PROPOFOL 10 MG/ML IV BOLUS
INTRAVENOUS | Status: AC
  Filled 2022-03-29: qty 20

## 2022-03-29 MED ORDER — CLOPIDOGREL BISULFATE 75 MG PO TABS
75.0000 mg | ORAL_TABLET | Freq: Every day | ORAL | Status: DC
  Administered 2022-03-30 – 2022-04-01 (×3): 75 mg via ORAL
  Filled 2022-03-29 (×3): qty 1

## 2022-03-29 MED ORDER — ESMOLOL HCL 100 MG/10ML IV SOLN
INTRAVENOUS | Status: AC
  Filled 2022-03-29: qty 10

## 2022-03-29 MED ORDER — SODIUM CHLORIDE 0.9 % IR SOLN
Status: DC | PRN
  Administered 2022-03-29: 3000 mL via INTRAVESICAL

## 2022-03-29 MED ORDER — IOHEXOL 300 MG/ML  SOLN
INTRAMUSCULAR | Status: DC | PRN
  Administered 2022-03-29: 18 mL via URETHRAL

## 2022-03-29 MED ORDER — ONDANSETRON HCL 4 MG/2ML IJ SOLN
INTRAMUSCULAR | Status: DC | PRN
  Administered 2022-03-29: 4 mg via INTRAVENOUS

## 2022-03-29 MED ORDER — SODIUM CHLORIDE 0.9 % IV SOLN: INTRAVENOUS | Status: DC

## 2022-03-29 MED ORDER — DEXAMETHASONE SODIUM PHOSPHATE 10 MG/ML IJ SOLN
INTRAMUSCULAR | Status: AC
  Filled 2022-03-29: qty 1

## 2022-03-29 MED ORDER — SODIUM CHLORIDE 0.9 % IV SOLN
2.0000 g | INTRAVENOUS | Status: AC
  Administered 2022-03-29: 2 g via INTRAVENOUS
  Filled 2022-03-29: qty 20

## 2022-03-29 MED ORDER — ROSUVASTATIN CALCIUM 10 MG PO TABS
10.0000 mg | ORAL_TABLET | Freq: Every day | ORAL | Status: DC
  Administered 2022-03-30 – 2022-04-01 (×3): 10 mg via ORAL
  Filled 2022-03-29 (×3): qty 1

## 2022-03-29 MED ORDER — LEVOTHYROXINE SODIUM 75 MCG PO TABS
150.0000 ug | ORAL_TABLET | Freq: Every day | ORAL | Status: DC
  Administered 2022-03-30 – 2022-04-01 (×3): 150 ug via ORAL
  Filled 2022-03-29 (×3): qty 2

## 2022-03-29 MED ORDER — ACETAMINOPHEN 10 MG/ML IV SOLN
INTRAVENOUS | Status: DC | PRN
  Administered 2022-03-29: 1000 mg via INTRAVENOUS

## 2022-03-29 MED ORDER — LISINOPRIL 10 MG PO TABS
10.0000 mg | ORAL_TABLET | Freq: Every day | ORAL | Status: DC
  Administered 2022-03-30 – 2022-04-01 (×3): 10 mg via ORAL
  Filled 2022-03-29 (×3): qty 1

## 2022-03-29 MED ORDER — ASPIRIN EC 81 MG PO TBEC
81.0000 mg | DELAYED_RELEASE_TABLET | Freq: Every day | ORAL | Status: DC
  Administered 2022-03-30 – 2022-04-01 (×3): 81 mg via ORAL
  Filled 2022-03-29 (×3): qty 1

## 2022-03-29 MED ORDER — LACTATED RINGERS IV SOLN
INTRAVENOUS | Status: DC
Start: 2022-03-29 — End: 2022-03-29

## 2022-03-29 MED ORDER — ACETAMINOPHEN 10 MG/ML IV SOLN: 1000.0000 mg | Freq: Once | INTRAVENOUS | Status: DC | PRN

## 2022-03-29 MED ORDER — AMISULPRIDE (ANTIEMETIC) 5 MG/2ML IV SOLN: 10.0000 mg | Freq: Once | INTRAVENOUS | Status: DC | PRN

## 2022-03-29 MED ORDER — PROPOFOL 10 MG/ML IV BOLUS
INTRAVENOUS | Status: DC | PRN
  Administered 2022-03-29: 200 mg via INTRAVENOUS

## 2022-03-29 MED ORDER — MORPHINE SULFATE (PF) 2 MG/ML IV SOLN: 2.0000 mg | INTRAVENOUS | Status: DC | PRN

## 2022-03-29 MED ORDER — ONDANSETRON HCL 4 MG/2ML IJ SOLN: 4.0000 mg | Freq: Once | INTRAMUSCULAR | Status: DC | PRN

## 2022-03-29 MED ORDER — SODIUM CHLORIDE 0.9 % IV SOLN
1.0000 g | INTRAVENOUS | Status: DC
  Administered 2022-03-30 – 2022-04-01 (×3): 1 g via INTRAVENOUS
  Filled 2022-03-29 (×3): qty 10

## 2022-03-29 MED ORDER — AMIODARONE HCL 200 MG PO TABS
200.0000 mg | ORAL_TABLET | ORAL | Status: DC
  Administered 2022-03-29 – 2022-04-01 (×4): 200 mg via ORAL
  Filled 2022-03-29 (×4): qty 1

## 2022-03-29 MED ORDER — ONDANSETRON HCL 4 MG/2ML IJ SOLN
INTRAMUSCULAR | Status: AC
  Filled 2022-03-29: qty 2

## 2022-03-29 MED ORDER — SENNA 8.6 MG PO TABS
1.0000 | ORAL_TABLET | Freq: Two times a day (BID) | ORAL | Status: DC
  Administered 2022-03-29 – 2022-04-01 (×6): 8.6 mg via ORAL
  Filled 2022-03-29 (×6): qty 1

## 2022-03-29 MED ORDER — FENTANYL CITRATE PF 50 MCG/ML IJ SOSY: 25.0000 ug | PREFILLED_SYRINGE | INTRAMUSCULAR | Status: DC | PRN

## 2022-03-29 MED ORDER — DIPHENHYDRAMINE HCL 12.5 MG/5ML PO ELIX: 12.5000 mg | ORAL_SOLUTION | Freq: Four times a day (QID) | ORAL | Status: DC | PRN

## 2022-03-29 MED ORDER — DEXAMETHASONE SODIUM PHOSPHATE 10 MG/ML IJ SOLN
INTRAMUSCULAR | Status: DC | PRN
  Administered 2022-03-29: 4 mg via INTRAVENOUS

## 2022-03-29 MED ORDER — CHLORHEXIDINE GLUCONATE 0.12 % MT SOLN
15.0000 mL | OROMUCOSAL | Status: AC
  Administered 2022-03-29: 15 mL via OROMUCOSAL

## 2022-03-29 MED ORDER — PHENYLEPHRINE 80 MCG/ML (10ML) SYRINGE FOR IV PUSH (FOR BLOOD PRESSURE SUPPORT)
PREFILLED_SYRINGE | INTRAVENOUS | Status: AC
  Filled 2022-03-29: qty 10

## 2022-03-29 MED ORDER — ONDANSETRON HCL 4 MG/2ML IJ SOLN: 4.0000 mg | INTRAMUSCULAR | Status: DC | PRN

## 2022-03-29 MED ORDER — ZOLPIDEM TARTRATE 5 MG PO TABS: 5.0000 mg | ORAL_TABLET | Freq: Every evening | ORAL | Status: DC | PRN

## 2022-03-29 SURGICAL SUPPLY — 25 items
BAG URO CATCHER STRL LF (MISCELLANEOUS) ×3 IMPLANT
BASKET LASER NITINOL 1.9FR (BASKET) IMPLANT
BASKET ZERO TIP NITINOL 2.4FR (BASKET) IMPLANT
BSKT STON RTRVL 120 1.9FR (BASKET)
BSKT STON RTRVL ZERO TP 2.4FR (BASKET)
CATH URETL OPEN END 6FR 70 (CATHETERS) ×3 IMPLANT
CLOTH BEACON ORANGE TIMEOUT ST (SAFETY) ×3 IMPLANT
EXTRACTOR STONE 1.7FRX115CM (UROLOGICAL SUPPLIES) IMPLANT
GLOVE BIO SURGEON STRL SZ7.5 (GLOVE) ×3 IMPLANT
GOWN STRL REUS W/ TWL XL LVL3 (GOWN DISPOSABLE) ×2 IMPLANT
GOWN STRL REUS W/TWL XL LVL3 (GOWN DISPOSABLE) ×4
GUIDEWIRE ANG ZIPWIRE 038X150 (WIRE) IMPLANT
GUIDEWIRE STR DUAL SENSOR (WIRE) ×3 IMPLANT
IV NS IRRIG 3000ML ARTHROMATIC (IV SOLUTION) ×1 IMPLANT
KIT TURNOVER KIT A (KITS) ×1 IMPLANT
LASER FIB FLEXIVA PULSE ID 365 (Laser) IMPLANT
MANIFOLD NEPTUNE II (INSTRUMENTS) ×3 IMPLANT
PACK CYSTO (CUSTOM PROCEDURE TRAY) ×3 IMPLANT
SHEATH NAVIGATOR HD 11/13X28 (SHEATH) IMPLANT
SHEATH NAVIGATOR HD 11/13X36 (SHEATH) IMPLANT
STENT URET 6FRX26 CONTOUR (STENTS) ×1 IMPLANT
TRACTIP FLEXIVA PULS ID 200XHI (Laser) IMPLANT
TRACTIP FLEXIVA PULSE ID 200 (Laser)
TUBING CONNECTING 10 (TUBING) ×3 IMPLANT
TUBING UROLOGY SET (TUBING) ×2 IMPLANT

## 2022-03-29 NOTE — Transfer of Care (Signed)
Immediate Anesthesia Transfer of Care Note ? ?Patient: Thomas [name] ? ?Procedure(s) Performed: CYSTOSCOPY WITH RETROGRADE PYELOGRAM AND STENT PLACEMENT (Right: Renal) ? ?Patient Location: PACU ? ?Anesthesia Type:General ? ?Level of Consciousness: awake ? ?Airway & Oxygen Therapy: Patient Spontanous Breathing and Patient connected to face mask oxygen ? ?Post-op Assessment: Report given to RN and Post -op Vital signs reviewed and stable ? ?Post vital signs: Reviewed and stable ? ?Last Vitals:  ?Vitals Value Taken Time  ?BP 121/57 03/29/22 1741  ?Temp 37.5 ?C 03/29/22 1741  ?Pulse 53 03/29/22 1745  ?Resp 16 03/29/22 1745  ?SpO2 100 % 03/29/22 1745  ?Vitals shown include unvalidated device data. ? ?Last Pain:  ?Vitals:  ? 03/29/22 1456  ?TempSrc:   ?PainSc: 0-No pain  ?   ? ?  ? ?Complications: No notable events documented. ?

## 2022-03-29 NOTE — Anesthesia Procedure Notes (Signed)
Procedure Name: LMA Insertion ?Date/Time: 03/29/2022 5:10 PM ?Performed by: Ezekiel Ina, CRNA ?Pre-anesthesia Checklist: Patient identified, Emergency Drugs available, Suction available and Patient being monitored ?Patient Re-evaluated:Patient Re-evaluated prior to induction ?Oxygen Delivery Method: Circle system utilized ?Preoxygenation: Pre-oxygenation with 100% oxygen ?Induction Type: IV induction ?Ventilation: Mask ventilation without difficulty ?LMA: LMA inserted ?LMA Size: 5.0 ?Number of attempts: 1 ?Tube secured with: Tape ?Dental Injury: Teeth and Oropharynx as per pre-operative assessment  ? ? ? ? ?

## 2022-03-29 NOTE — Op Note (Signed)
Operative Note ? ?Preoperative diagnosis:  ?1.  Right ureteral calculus with suspected UTI ? ?Post operative diagnosis: ?1.  Right ureteral calculus with suspected UTI ? ?Procedure(s): ?1.  Cystoscopy with right retrograde pyelogram and right ureteral stent placement ? ?Surgeon: Modena Slater, MD ? ?Assistants: None ? ?Anesthesia: General ? ?Complications: None immediate ? ?EBL: Minimal ? ?Specimens: ?1.  None ? ?Drains/Catheters: ?1.  6 X 26 double-J ureteral stent ? ?Intraoperative findings: 1.  Normal urethra and bladder 2.  Right retrograde pyelogram revealed a filling defect at the level of the stone with upstream hydroureteronephrosis 3.  He had what appeared to be a second more medial ureteral orifice consistent with possible duplicated system.  However, intubation with the open-ended ureteral catheter and subsequent retrograde pyelogram did not reveal any contrast extending up the ureter. ? ?Indication: 81 year old male with a right ureteral calculus and symptoms concerning for UTI including dysuria, altered mental status the day prior, presents for urgent ureteral stent placement ? ?Description of procedure: ? ?The patient was identified and consent was obtained.  The patient was taken to the operating room and placed in the supine position.  The patient was placed under general anesthesia.  Perioperative antibiotics were administered.  The patient was placed in dorsal lithotomy.  Patient was prepped and draped in a standard sterile fashion and a timeout was performed. ? ?A 21 French rigid cystoscope was advanced into the urethra and into the bladder.  The right distal most portion of the ureter was cannulated with an open-ended ureteral catheter.  Retrograde pyelogram was performed with the findings noted above.  A sensor wire was then advanced up to the kidney under fluoroscopic guidance.  A 6 X 26 double-J ureteral stent was advanced up to the kidney under fluoroscopic guidance.  The wire was withdrawn and  fluoroscopy confirmed good proximal placement and direct visualization confirmed a good coil within the bladder.  I saw what appeared to be a second ureteral orifice on the right.  I intubated it with an open-ended ureteral catheter and instilled some contrast but no contrast extended up the ureter suggesting against this being the ureteral orifice.  Therefore, the bladder was drained and the scope withdrawn.  This concluded the operation.  Patient tolerated procedure well and was stable postoperatively. ? ?Plan: Admit to the hospital and continue antibiotics.  I sent a urine culture in the office.  We will follow-up on those results. ? ? ?

## 2022-03-29 NOTE — H&P (Signed)
CC/HPI: CC: Right ureteral calculus  ?HPI:  ?03/29/2022  ?81 year old male with history of nephrolithiasis. He has always passed them on his own. He went to the emergency department on 03/24/2022. He was found to have a 5 mm mid to distal right ureteral calculus with upstream hydronephrosis. He was prescribed tamsulosin, hydrocodone, Zofran. Yesterday, he was having some dizziness and hallucinations. He was told to stop his tamsulosin. This has resolved today. Denies any fever. He does have some dysuria.  ? ?  ?ALLERGIES: Oxycodone-Acetaminophen ?Robaxin ?Ultram ?  ? ?MEDICATIONS: Aspirin 81 mg tablet,chewable  ?Lisinopril 10 mg tablet  ?Amiodarone Hcl 200 mg tablet  ?Clopidogrel 75 mg tablet  ?Rosuvastatin Calcium 10 mg tablet  ?Vitamin B12  ?  ? ?GU PSH: None  ? ?NON-GU PSH: Cholecystectomy (laparoscopic) ?Hip Replacement, Bilateral ? ?  ? ?GU PMH: None  ? ?NON-GU PMH: Heart disease, unspecified ?Hypercholesterolemia ?Hypertension ?Hypothyroidism ?  ? ?FAMILY HISTORY: 3 daughters - Other ?Heart Attack - Mother ?Heart Disease - Brother  ? ?SOCIAL HISTORY: Marital Status: Married ?Preferred Language: English; Race: White ?Current Smoking Status: Patient has never smoked.  ? ?Tobacco Use Assessment Completed: Used Tobacco in last 30 days? ?Does not drink caffeine. ?  ? ?REVIEW OF SYSTEMS:    ?GU Review Male:   Patient reports frequent urination, hard to postpone urination, burning/ pain with urination, get up at night to urinate, and leakage of urine. Patient denies stream starts and stops, trouble starting your stream, have to strain to urinate , erection problems, and penile pain.  ?Gastrointestinal (Upper):   Patient reports nausea. Patient denies vomiting and indigestion/ heartburn.  ?Gastrointestinal (Lower):   Patient denies diarrhea and constipation.  ?Constitutional:   Patient denies fever, night sweats, weight loss, and fatigue.  ?Skin:   Patient denies skin rash/ lesion and itching.  ?Eyes:   Patient denies  blurred vision and double vision.  ?Ears/ Nose/ Throat:   Patient denies sore throat and sinus problems.  ?Hematologic/Lymphatic:   Patient reports easy bruising. Patient denies swollen glands.  ?Cardiovascular:   Patient denies leg swelling and chest pains.  ?Respiratory:   Patient reports cough. Patient denies shortness of breath.  ?Endocrine:   Patient denies excessive thirst.  ?Musculoskeletal:   Patient reports back pain. Patient denies joint pain.  ?Neurological:   Patient reports headaches and dizziness.   ?Psychologic:   Patient denies depression and anxiety.  ? ?Notes: hematuria ?  ? ?VITAL SIGNS:    ?  03/29/2022 09:14 AM  ?Weight 207 lb / 93.89 kg  ?Height 72 in / 182.88 cm  ?BP 147/79 mmHg  ?Heart Rate 80 /min  ?BMI 28.1 kg/m?  ? ?MULTI-SYSTEM PHYSICAL EXAMINATION:    ?Constitutional: Well-nourished. No physical deformities. Normally developed. Good grooming.  ?Respiratory: No labored breathing, no use of accessory muscles.   ?Cardiovascular: Normal temperature, normal extremity pulses, no swelling, no varicosities.  ?Skin: No paleness, no jaundice, no cyanosis. No lesion, no ulcer, no rash.  ?Neurologic / Psychiatric: Oriented to time, oriented to place, oriented to person. No depression, no anxiety, no agitation.  ?Gastrointestinal: No mass, no tenderness, no rigidity, non obese abdomen.  ?Eyes: Normal conjunctivae. Normal eyelids.  ?Musculoskeletal: Normal gait and station of head and neck.  ? ?  ?Complexity of Data:  ?Source Of History:  Patient  ?Records Review:   Previous Doctor Records, Previous Patient Records  ?Urine Test Review:   Urinalysis  ? ?PROCEDURES:    ?     KUB - [id number]  ?  A single view of the abdomen is obtained.  ?  ?  ?Patient confirmed No Neulasta OnPro Device.  ?  ? ? ?     Urinalysis w/Scope ?Dipstick Dipstick Cont'd Micro  ?Color: Amber Bilirubin: Neg mg/dL WBC/hpf: >03/TDH  ?Appearance: Turbid Ketones: 1+ mg/dL RBC/hpf: 20 - 74/BUL  ?Specific Gravity: 1.020 Blood: 3+ ery/uL  Bacteria: Few (10-25/hpf)  ?pH: 6.0 Protein: 3+ mg/dL Cystals: NS (Not Seen)  ?Glucose: Neg mg/dL Urobilinogen: 0.2 mg/dL Casts: NS (Not Seen)  ?  Nitrites: Neg Trichomonas: Not Present  ?  Leukocyte Esterase: 3+ leu/uL Mucous: Not Present  ?    Epithelial Cells: 0 - 5/hpf  ?    Yeast: NS (Not Seen)  ?    Sperm: Not Present  ? ? ?Notes: Unspun micro due to clarity ?  ? ? ?     Ceftriaxone 1g - [id number], [id number] ?Qty: 1 Adm. By: Tawnya Crook  ?Unit: gram Lot No [id number]  ?Route: IM Exp. Date 04/12/2023  ?Freq: None Mfgr.:   ?Site: Right Hip  ? ?ASSESSMENT:  ?    ICD-10 Details  ?1 GU:   Ureteral calculus - N20.1 Undiagnosed New Problem  ?2   Dysuria - R30.0 Undiagnosed New Problem  ? ?PLAN:    ? ?      Orders ?Labs Urine Culture  ?X-Rays: KUB  ? ? ?      Schedule ?Procedure: Unspecified Date - Cystoscopy Insert Stent - [id number], right  ? ? ?      Document ?Letter(s):  Created for Patient: Clinical Summary  ? ? ?     Notes:   Urine culture today. 1 g of Rocephin given.  ? ?Patient is having some hallucinations at home. Though these have improved, this is consistent with possible UTI. Also may be secondary to medication side effects but I am concerned about obstructing stone with UTI. I discussed the option of admission to the hospital with antibiotics and stent placement. He would like to proceed with urgent ureteral stent placement today.  ? ?CC: Dr. Jeannetta Nap  ? ? ?Signed by Modena Slater, III, M.D. on 03/29/22 at 10:21 AM (EDT ?

## 2022-03-29 NOTE — Anesthesia Preprocedure Evaluation (Addendum)
Anesthesia Evaluation  ?Patient identified by MRN, date of birth, ID band ?Patient awake ? ? ? ?Reviewed: ?Allergy & Precautions, NPO status , Patient's Chart, lab work & pertinent test results ? ?Airway ?Mallampati: III ? ?TM Distance: >3 FB ?Neck ROM: Full ? ? ? Dental ?no notable dental hx. ? ?  ?Pulmonary ?former smoker,  ?  ?Pulmonary exam normal ? ? ? ? ? ? ? Cardiovascular ?hypertension, Pt. on medications ?+ CAD  ?Normal cardiovascular exam+ dysrhythmias  ? ? ?  ?Neuro/Psych ?negative neurological ROS ? negative psych ROS  ? GI/Hepatic ?negative GI ROS, Neg liver ROS,   ?Endo/Other  ?Hypothyroidism  ? Renal/GU ?Renal disease  ? ?  ?Musculoskeletal ? ?(+) Arthritis ,  ? Abdominal ?  ?Peds ? Hematology ? ?(+) Blood dyscrasia, anemia ,   ?Anesthesia Other Findings ?RIGHT URETERAL STONE ?UTI ? Reproductive/Obstetrics ? ?  ? ? ? ? ? ? ? ? ? ? ? ? ? ?  ?  ? ? ? ? ? ? ? ?Anesthesia Physical ?Anesthesia Plan ? ?ASA: 3 ? ?Anesthesia Plan: General  ? ?Post-op Pain Management:   ? ?Induction: Intravenous ? ?PONV Risk Score and Plan: 3 and Ondansetron, Dexamethasone and Treatment may vary due to age or medical condition ? ?Airway Management Planned:  ? ?Additional Equipment:  ? ?Intra-op Plan:  ? ?Post-operative Plan: Extubation in OR ? ?Informed Consent: I have reviewed the patients History and Physical, chart, labs and discussed the procedure including the risks, benefits and alternatives for the proposed anesthesia with the patient or authorized representative who has indicated his/her understanding and acceptance.  ? ? ? ?Dental advisory given ? ?Plan Discussed with: CRNA ? ?Anesthesia Plan Comments:   ? ? ? ? ? ?Anesthesia Quick Evaluation ? ?

## 2022-03-30 ENCOUNTER — Encounter (HOSPITAL_COMMUNITY): Payer: Self-pay | Admitting: Urology

## 2022-03-30 NOTE — Progress Notes (Signed)
Urology Inpatient Progress Report ? ?UTI (urinary tract infection) [N39.0] ? ?Procedure(s): ?CYSTOSCOPY WITH RETROGRADE PYELOGRAM AND STENT PLACEMENT ? ?1 Day Post-Op ? ? ?Intv/Subj: ?No acute events overnight. ?Patient is without complaint.  He is feeling much improved.  Is having some gross hematuria as expected but voiding well. ? ?Principal Problem: ?  UTI (urinary tract infection) ? ?Current Facility-Administered Medications  ?Medication Dose Route Frequency Provider Last Rate Last Admin  ? 0.9 %  sodium chloride infusion   Intravenous Continuous Ray Church III, MD 50 mL/hr at 03/29/22 1948 New Bag at 03/29/22 1948  ? acetaminophen (TYLENOL) tablet 650 mg  650 mg Oral Q4H PRN Ray Church III, MD      ? amiodarone (PACERONE) tablet 200 mg  200 mg Oral Once per day on Mon Tue Wed Thu Fri Sat Ray Church III, MD   200 mg at 03/30/22 1127  ? aspirin EC tablet 81 mg  81 mg Oral Daily Ray Church III, MD   81 mg at 03/30/22 1126  ? cefTRIAXone (ROCEPHIN) 1 g in sodium chloride 0.9 % 100 mL IVPB  1 g Intravenous Q24H Ray Church III, MD      ? clopidogrel (PLAVIX) tablet 75 mg  75 mg Oral Daily Ray Church III, MD   75 mg at 03/30/22 1127  ? diphenhydrAMINE (BENADRYL) injection 12.5 mg  12.5 mg Intravenous Q6H PRN Ray Church III, MD      ? Or  ? diphenhydrAMINE (BENADRYL) 12.5 MG/5ML elixir 12.5 mg  12.5 mg Oral Q6H PRN Ray Church III, MD      ? docusate sodium (COLACE) capsule 100 mg  100 mg Oral BID Ray Church III, MD   100 mg at 03/30/22 1126  ? HYDROcodone-acetaminophen (NORCO/VICODIN) 5-325 MG per tablet 1-2 tablet  1-2 tablet Oral Q4H PRN Crista Elliot, MD   1 tablet at 03/30/22 0011  ? levothyroxine (SYNTHROID) tablet 150 mcg  150 mcg Oral Q0600 Ray Church III, MD   150 mcg at 03/30/22 0504  ? lisinopril (ZESTRIL) tablet 10 mg  10 mg Oral Daily Ray Church III, MD   10 mg at 03/30/22 1126  ? morphine (PF) 2 MG/ML injection 2-4 mg  2-4 mg Intravenous Q2H PRN Ray Church III, MD      ? ondansetron [facility]) injection 4 mg  4 mg Intravenous Q4H PRN Ray Church III, MD      ? oxybutynin (DITROPAN) tablet 5 mg  5 mg Oral Q8H PRN Ray Church III, MD      ? rosuvastatin (CRESTOR) tablet 10 mg  10 mg Oral Daily Ray Church III, MD   10 mg at 03/30/22 1127  ? senna (SENOKOT) tablet 8.6 mg  1 tablet Oral BID Ray Church III, MD   8.6 mg at 03/30/22 1126  ? zolpidem (AMBIEN) tablet 5 mg  5 mg Oral QHS PRN Crista Elliot, MD      ? ? ? ?Objective: ?Vital: ?Vitals:  ? 03/29/22 2005 03/30/22 0001 03/30/22 0515 03/30/22 1126  ?BP: (!) 114/91 (!) 150/83 (!) 158/80 125/72  ?Pulse: 64 (!) 59 (!) 56   ?Resp: 18 20 20    ?Temp: 97.9 ?F (36.6 ?C) 97.7 ?F (36.5 ?C) 97.6 ?F (36.4 ?C)   ?TempSrc: Oral Oral    ?SpO2: 97% 98% 97%   ?Weight:      ?Height:      ? ?  I/Os: ?I/O last 3 completed shifts: ?In: 1890 [P.O.:240; I.V.:1450; IV Piggyback:200] ?Out: 975 [Urine:975] ? ?Physical Exam:  ?General: Patient is in no apparent distress ?Lungs: Normal respiratory effort, chest expands symmetrically. ?GI: the abdomen is soft and nontender without mass. ?Ext: lower extremities symmetric ? ?Lab Results: ?Recent Labs  ?  03/29/22 ?1455  ?WBC 9.5  ?HGB 12.5*  ?HCT 38.3*  ? ?Recent Labs  ?  03/29/22 ?1455  ?NA 137  ?K 3.1*  ?CL 99  ?CO2 27  ?GLUCOSE 102*  ?BUN 19  ?CREATININE 1.76*  ?CALCIUM 9.1  ? ?No results for input(s): LABPT, INR in the last 72 hours. ?No results for input(s): LABURIN in the last 72 hours. ?Results for orders placed or performed during the hospital encounter of 07/01/19  ?SARS Coronavirus 2 (CEPHEID- Performed in [facility] hospital lab), Hosp Order     Status: None  ? Collection Time: 07/01/19  2:59 PM  ? Specimen: Nasopharyngeal Swab  ?Result Value Ref Range Status  ? SARS Coronavirus 2 NEGATIVE NEGATIVE Final  ?  Comment: (NOTE) ?If result is NEGATIVE ?SARS-CoV-2 target nucleic acids are NOT DETECTED. ?The SARS-CoV-2 RNA is generally detectable in upper and lower   ?respiratory specimens during the acute phase of infection. The lowest  ?concentration of SARS-CoV-2 viral copies this assay can detect is 250  ?copies / mL. A negative result does not preclude SARS-CoV-2 infection  ?and should not be used as the sole basis for treatment or other  ?patient management decisions.  A negative result may occur with  ?improper specimen collection / handling, submission of specimen other  ?than nasopharyngeal swab, presence of viral mutation(s) within the  ?areas targeted by this assay, and inadequate number of viral copies  ?(<250 copies / mL). A negative result must be combined with clinical  ?observations, patient history, and epidemiological information. ?If result is POSITIVE ?SARS-CoV-2 target nucleic acids are DETECTED. ?The SARS-CoV-2 RNA is generally detectable in upper and lower  ?respiratory specimens dur ing the acute phase of infection.  Positive  ?results are indicative of active infection with SARS-CoV-2.  Clinical  ?correlation with patient history and other diagnostic information is  ?necessary to determine patient infection status.  Positive results do  ?not rule out bacterial infection or co-infection with other viruses. ?If result is PRESUMPTIVE POSTIVE ?SARS-CoV-2 nucleic acids MAY BE PRESENT.   ?A presumptive positive result was obtained on the submitted specimen  ?and confirmed on repeat testing.  While 2019 novel coronavirus  ?(SARS-CoV-2) nucleic acids may be present in the submitted sample  ?additional confirmatory testing may be necessary for epidemiological  ?and / or clinical management purposes  to differentiate between  ?SARS-CoV-2 and other Sarbecovirus currently known to infect humans.  ?If clinically indicated additional testing with an alternate test  ?methodology [phone]) is advised. The SARS-CoV-2 RNA is generally  ?detectable in upper and lower respiratory sp ecimens during the acute  ?phase of infection. ?The expected result is Negative. ?Fact  Sheet for Patients:  BoilerBrush.com.cy ?Fact Sheet for Healthcare Providers: ?https://pope.com/ ?This test is not yet approved or cleared by the [place] FDA and ?has been authorized for detection and/or diagnosis of SARS-CoV-2 by ?FDA under an Emergency Use Authorization (EUA).  This EUA will remain ?in effect (meaning this test can be used) for the duration of the ?COVID-19 declaration under Section 564(b)(1) of the Act, 21 U.S.C. ?section 360bbb-3(b)(1), unless the authorization is terminated or ?revoked sooner. ?Performed at [facility], [address] [place], [place] ?[address] ?  ? ? ?Studies/Results: ?DG C-Arm 1-60 Min-No Report ? ?Result Date: 03/29/2022 ?Fluoroscopy was utilized by the requesting physician.  No radiographic interpretation.   ? ?Assessment: ?Right ureteral calculus ?Right ureteral obstruction secondary to calculus ?Urinary tract infection ? ?Procedure(s): ?CYSTOSCOPY WITH RETROGRADE PYELOGRAM AND STENT PLACEMENT, 1 Day Post-Op  doing well. ? ?Plan: ?Continue ceftriaxone.  Awaiting final cultures prior to discharging on an oral antibiotic.  Anticipate another day or 2 for the cultures to return.  I sent a urine culture at my office. ? ? ?Modena SlaterEugene Jakeline Dave, MD ?Urology ?03/30/2022, 12:08 PM ? ?

## 2022-03-30 NOTE — Anesthesia Postprocedure Evaluation (Signed)
Anesthesia Post Note ? ?Patient: Thomas [name] ? ?Procedure(s) Performed: CYSTOSCOPY WITH RETROGRADE PYELOGRAM AND STENT PLACEMENT (Right: Renal) ? ?  ? ?Patient location during evaluation: PACU ?Anesthesia Type: General ?Level of consciousness: awake ?Pain management: pain level controlled ?Vital Signs Assessment: post-procedure vital signs reviewed and stable ?Respiratory status: spontaneous breathing, nonlabored ventilation, respiratory function stable and patient connected to nasal cannula oxygen ?Cardiovascular status: blood pressure returned to baseline and stable ?Postop Assessment: no apparent nausea or vomiting ?Anesthetic complications: no ? ? ?No notable events documented. ? ?Last Vitals:  ?Vitals:  ? 03/29/22 2005 03/30/22 0001  ?BP: (!) 114/91 (!) 150/83  ?Pulse: 64 (!) 59  ?Resp: 18 20  ?Temp: 36.6 ?C 36.5 ?C  ?SpO2: 97% 98%  ?  ?Last Pain:  ?Vitals:  ? 03/30/22 0001  ?TempSrc: Oral  ?PainSc:   ? ? ?  ?  ?  ?  ?  ?  ? ?[name] ? ? ? ? ?

## 2022-03-31 ENCOUNTER — Telehealth (INDEPENDENT_AMBULATORY_CARE_PROVIDER_SITE_OTHER): Payer: Medicare Other | Admitting: Cardiovascular Disease

## 2022-03-31 ENCOUNTER — Other Ambulatory Visit: Payer: Self-pay | Admitting: Urology

## 2022-03-31 MED ORDER — POLYETHYLENE GLYCOL 3350 17 G PO PACK
17.0000 g | PACK | Freq: Every day | ORAL | Status: DC
  Administered 2022-03-31 – 2022-04-01 (×2): 17 g via ORAL
  Filled 2022-03-31 (×2): qty 1

## 2022-03-31 NOTE — Progress Notes (Signed)
Urology Inpatient Progress Report ? ?UTI (urinary tract infection) [N39.0] ? ?Procedure(s): ?CYSTOSCOPY WITH RETROGRADE PYELOGRAM AND STENT PLACEMENT ? ?2 Days Post-Op ? ? ?Intv/Subj: ?No acute events overnight. ?Patient is without complaint. ? ?Principal Problem: ?  UTI (urinary tract infection) ? ?Current Facility-Administered Medications  ?Medication Dose Route Frequency Provider Last Rate Last Admin  ? 0.9 %  sodium chloride infusion   Intravenous Continuous Crista Elliot, MD   Stopped at 03/30/22 1343  ? acetaminophen (TYLENOL) tablet 650 mg  650 mg Oral Q4H PRN Ray Church III, MD      ? amiodarone (PACERONE) tablet 200 mg  200 mg Oral Once per day on Mon Tue Wed Thu Fri Sat Ray Church III, MD   200 mg at 03/31/22 2703  ? aspirin EC tablet 81 mg  81 mg Oral Daily Ray Church III, MD   81 mg at 03/31/22 5009  ? cefTRIAXone (ROCEPHIN) 1 g in sodium chloride 0.9 % 100 mL IVPB  1 g Intravenous Q24H Ray Church III, MD 200 mL/hr at 03/31/22 1731 1 g at 03/31/22 1731  ? clopidogrel (PLAVIX) tablet 75 mg  75 mg Oral Daily Ray Church III, MD   75 mg at 03/31/22 3818  ? diphenhydrAMINE (BENADRYL) injection 12.5 mg  12.5 mg Intravenous Q6H PRN Ray Church III, MD      ? Or  ? diphenhydrAMINE (BENADRYL) 12.5 MG/5ML elixir 12.5 mg  12.5 mg Oral Q6H PRN Ray Church III, MD      ? docusate sodium (COLACE) capsule 100 mg  100 mg Oral BID Ray Church III, MD   100 mg at 03/31/22 2993  ? HYDROcodone-acetaminophen (NORCO/VICODIN) 5-325 MG per tablet 1-2 tablet  1-2 tablet Oral Q4H PRN Crista Elliot, MD   1 tablet at 03/30/22 2128  ? levothyroxine (SYNTHROID) tablet 150 mcg  150 mcg Oral Q0600 Ray Church III, MD   150 mcg at 03/31/22 7169  ? lisinopril (ZESTRIL) tablet 10 mg  10 mg Oral Daily Ray Church III, MD   10 mg at 03/31/22 6789  ? morphine (PF) 2 MG/ML injection 2-4 mg  2-4 mg Intravenous Q2H PRN Ray Church III, MD      ? ondansetron [facility]) injection 4 mg  4 mg  Intravenous Q4H PRN Ray Church III, MD      ? oxybutynin (DITROPAN) tablet 5 mg  5 mg Oral Q8H PRN Ray Church III, MD      ? polyethylene glycol (MIRALAX / GLYCOLAX) packet 17 g  17 g Oral Daily Ray Church III, MD      ? rosuvastatin (CRESTOR) tablet 10 mg  10 mg Oral Daily Ray Church III, MD   10 mg at 03/31/22 3810  ? senna (SENOKOT) tablet 8.6 mg  1 tablet Oral BID Ray Church III, MD   8.6 mg at 03/31/22 1751  ? zolpidem (AMBIEN) tablet 5 mg  5 mg Oral QHS PRN Crista Elliot, MD      ? ? ? ?Objective: ?Vital: ?Vitals:  ? 03/30/22 1851 03/30/22 2024 03/31/22 0546 03/31/22 1245  ?BP:  (!) 142/72 (!) 149/71 130/64  ?Pulse:  (!) 53 60 (!) 55  ?Resp:  20 20 (!) 24  ?Temp: 97.8 ?F (36.6 ?C) 97.6 ?F (36.4 ?C) 97.6 ?F (36.4 ?C) (!) 97.3 ?F (36.3 ?C)  ?TempSrc: Oral Oral Oral Oral  ?SpO2:  98% 97%  98%  ?Weight:      ?Height:      ? ?I/Os: ?I/O last 3 completed shifts: ?In: 2605.8 [P.O.:1060; I.V.:1445.8; IV Piggyback:100] ?Out: 2175 [Urine:2175] ? ?Physical Exam:  ?General: Patient is in no apparent distress ?Lungs: Normal respiratory effort, chest expands symmetrically. ?GI:The abdomen is soft and nontender without mass. ?Ext: lower extremities symmetric ? ?Lab Results: ?Recent Labs  ?  03/29/22 ?1455  ?WBC 9.5  ?HGB 12.5*  ?HCT 38.3*  ? ?Recent Labs  ?  03/29/22 ?1455  ?NA 137  ?K 3.1*  ?CL 99  ?CO2 27  ?GLUCOSE 102*  ?BUN 19  ?CREATININE 1.76*  ?CALCIUM 9.1  ? ?No results for input(s): LABPT, INR in the last 72 hours. ?No results for input(s): LABURIN in the last 72 hours. ?Results for orders placed or performed during the hospital encounter of 07/01/19  ?SARS Coronavirus 2 (CEPHEID- Performed in [facility] hospital lab), Hosp Order     Status: None  ? Collection Time: 07/01/19  2:59 PM  ? Specimen: Nasopharyngeal Swab  ?Result Value Ref Range Status  ? SARS Coronavirus 2 NEGATIVE NEGATIVE Final  ?  Comment: (NOTE) ?If result is NEGATIVE ?SARS-CoV-2 target nucleic acids are NOT DETECTED. ?The  SARS-CoV-2 RNA is generally detectable in upper and lower  ?respiratory specimens during the acute phase of infection. The lowest  ?concentration of SARS-CoV-2 viral copies this assay can detect is 250  ?copies / mL. A negative result does not preclude SARS-CoV-2 infection  ?and should not be used as the sole basis for treatment or other  ?patient management decisions.  A negative result may occur with  ?improper specimen collection / handling, submission of specimen other  ?than nasopharyngeal swab, presence of viral mutation(s) within the  ?areas targeted by this assay, and inadequate number of viral copies  ?(<250 copies / mL). A negative result must be combined with clinical  ?observations, patient history, and epidemiological information. ?If result is POSITIVE ?SARS-CoV-2 target nucleic acids are DETECTED. ?The SARS-CoV-2 RNA is generally detectable in upper and lower  ?respiratory specimens dur ing the acute phase of infection.  Positive  ?results are indicative of active infection with SARS-CoV-2.  Clinical  ?correlation with patient history and other diagnostic information is  ?necessary to determine patient infection status.  Positive results do  ?not rule out bacterial infection or co-infection with other viruses. ?If result is PRESUMPTIVE POSTIVE ?SARS-CoV-2 nucleic acids MAY BE PRESENT.   ?A presumptive positive result was obtained on the submitted specimen  ?and confirmed on repeat testing.  While 2019 novel coronavirus  ?(SARS-CoV-2) nucleic acids may be present in the submitted sample  ?additional confirmatory testing may be necessary for epidemiological  ?and / or clinical management purposes  to differentiate between  ?SARS-CoV-2 and other Sarbecovirus currently known to infect humans.  ?If clinically indicated additional testing with an alternate test  ?methodology [phone]) is advised. The SARS-CoV-2 RNA is generally  ?detectable in upper and lower respiratory sp ecimens during the acute  ?phase  of infection. ?The expected result is Negative. ?Fact Sheet for Patients:  BoilerBrush.com.cy ?Fact Sheet for Healthcare Providers: ?https://pope.com/ ?This test is not yet approved or cleared by the [place] FDA and ?has been authorized for detection and/or diagnosis of SARS-CoV-2 by ?FDA under an Emergency Use Authorization (EUA).  This EUA will remain ?in effect (meaning this test can be used) for the duration of the ?COVID-19 declaration under Section 564(b)(1) of the Act, 21 U.S.C. ?section 360bbb-3(b)(1), unless the authorization  is terminated or ?revoked sooner. ?Performed at [facility], [address] [place], [place] ?[id number] ?  ? ? ?Studies/Results: ?No results found. ? ?Assessment: ?Right ureteral calculus ?Urinary tract infection ? ?Procedure(s): ?CYSTOSCOPY WITH RETROGRADE PYELOGRAM AND STENT PLACEMENT, 2 Days Post-Op  doing well. ? ?Plan: ?I called the lab today.  Final culture is not back yet.  Hopefully final culture will result tomorrow.  Discharge pending culture results. ? ? ?Modena SlaterEugene Joven Mom, MD ?Urology ?03/31/2022, 5:49 PM ? ?

## 2022-03-31 NOTE — Telephone Encounter (Signed)
? ?  Pre-operative Risk Assessment  ?  ?Patient Name: Thomas [name]  ?DOB: Mar 24, 1941 ?MRN: [id number]  ? ?  ? ?Request for Surgical Clearance   ? ?Procedure:  right ureteroscopy  ? ?Date of Surgery:  Clearance 04/18/22                              ?   ?Surgeon:  Dr. Gloriann Loan  ?Surgeon's Group or Practice Name:  [facility] ?Phone number:  [phone] ?Fax number:  [phone] ?  ?Type of Clearance Requested:   ?- Medical  ?- Pharmacy:  Hold Aspirin and Clopidogrel (Plavix) 5 days ?  ?Type of Anesthesia:  General  ?  ?Additional requests/questions:   ? ?Signed, ?Selena Zobro   ?03/31/2022, 3:01 PM   ?

## 2022-03-31 NOTE — Telephone Encounter (Signed)
? ?  Patient Name: Thomas [name]  ?DOB: [birth year] ?MRN: [id number] ? ?Primary Cardiologist: Kristeen Miss, MD ? ?Chart reviewed as part of pre-operative protocol coverage.  ? ?The patient has an upcoming appointment scheduled 04/04/22 with Dr. Elease Hashimoto at which time this clearance can be addressed in case there are any new issues addressed that would impact pre-operative recommendations. (Procedure date is 04/18/22.) ? ?- I added "preop" comment to appointment notes so that provider is aware to address at time of OV. Per office protocol, the provider seeing this patient should forward their finalized clearance decision to requesting party below. ? ?- Will fax update to requesting surgeon so they are aware and remove from preop box as separate preop APP input not necessary at this time. ? ?Laurann Montana, PA-C ?03/31/2022, 5:40 PM ? ?

## 2022-04-01 MED ORDER — SULFAMETHOXAZOLE-TRIMETHOPRIM 800-160 MG PO TABS: 1.0000 | ORAL_TABLET | Freq: Two times a day (BID) | ORAL | 0 refills | Status: DC

## 2022-04-01 NOTE — Care Management Important Message (Signed)
Important Message  Patient Details IM Letter placed in Patients room. Name: Thomas [name] MRN: [id number] Date of Birth: January 11, 1941   Medicare Important Message Given:  Yes     Caren Macadam 04/01/2022, 12:50 PM

## 2022-04-01 NOTE — Discharge Instructions (Signed)

## 2022-04-01 NOTE — Plan of Care (Signed)
?  Problem: Health Behavior/Discharge Planning: Goal: Ability to manage health-related needs will improve Outcome: Adequate for Discharge   Problem: Clinical Measurements: Goal: Ability to maintain clinical measurements within normal limits will improve Outcome: Adequate for Discharge Goal: Will remain free from infection Outcome: Adequate for Discharge Goal: Diagnostic test results will improve Outcome: Adequate for Discharge Goal: Respiratory complications will improve Outcome: Adequate for Discharge Goal: Cardiovascular complication will be avoided Outcome: Adequate for Discharge   Problem: Activity: Goal: Risk for activity intolerance will decrease Outcome: Adequate for Discharge   Problem: Coping: Goal: Level of anxiety will decrease Outcome: Adequate for Discharge   Problem: Elimination: Goal: Will not experience complications related to bowel motility Outcome: Adequate for Discharge Goal: Will not experience complications related to urinary retention Outcome: Adequate for Discharge   Problem: Pain Managment: Goal: General experience of comfort will improve Outcome: Adequate for Discharge   Problem: Safety: Goal: Ability to remain free from injury will improve Outcome: Adequate for Discharge   Problem: Skin Integrity: Goal: Risk for impaired skin integrity will decrease Outcome: Adequate for Discharge   

## 2022-04-01 NOTE — Progress Notes (Signed)
?  Transition of Care (TOC) Screening Note ? ? ?Patient Details  ?Name: Thomas [name] ?Date of Birth: 29-Jul-1941 ? ? ?Transition of Care [facility]) CM/SW Contact:    ?Lanier Clam, RN ?Phone Number: ?04/01/2022, 12:17 PM ? ? ? ?Transition of Care Department [facility]) has reviewed patient and no TOC needs have been identified at this time. We will continue to monitor patient advancement through interdisciplinary progression rounds. If new patient transition needs arise, please place a TOC consult. ?  ?

## 2022-04-04 ENCOUNTER — Encounter: Payer: Self-pay | Admitting: Cardiovascular Disease

## 2022-04-04 ENCOUNTER — Ambulatory Visit: Payer: Medicare Other | Admitting: Cardiovascular Disease

## 2022-04-04 VITALS — BP 142/74 | HR 66 | Ht 72.0 in | Wt 204.4 lb

## 2022-04-04 DIAGNOSIS — E782 Mixed hyperlipidemia: Secondary | ICD-10-CM

## 2022-04-04 DIAGNOSIS — I2510 Atherosclerotic heart disease of native coronary artery without angina pectoris: Secondary | ICD-10-CM

## 2022-04-04 MED ORDER — CLOPIDOGREL BISULFATE 75 MG PO TABS: 75.0000 mg | ORAL_TABLET | Freq: Every day | ORAL | 3 refills | Status: DC

## 2022-04-04 NOTE — Progress Notes (Signed)
? ?Cardiology Office Note ? ? ?Date:  04/04/2022  ? ?ID:  Thomas [name], DOB Dec 27, 1940, MRN [id number] ? ?PCP:  Kaleen Mask, MD  ?Cardiologist:   Kristeen Miss, MD  ? ?No chief complaint on file. ? ?Problem list: ?1. Coronary artery disease-has a stent in his left anterior descending artery. The proximal edge of the stent hangs out into the left main. He's had thrombus formation on the proximal edge of the stent that embolized down the left circumflex artery when he was off Plavix. Because of this we have kept him on Plavix lifelong  ? ?2. Essential hypertension -  ? ?3. Hyperlipidemia-  ? ?Thomas [name] is a 81 year old gentleman with a history of coronary artery disease. He is status post PTCA and stenting of his left anterior descending artery. He's had thrombus formation on the proximal edge of the stent which extended out over the left circumflex artery. He's been on Plavix since that time. ? ?He complains of some hip pain.  He still smokes a few cigarettes a day. ? ?February 26, 2014: ? ?Since I last saw him 1 1/2 years ago, he has had hip surgery .    I had recommneded that he not stop his plavix.  Dr. Despina Hick opperated on him ON plavix and had no significant bleeding episodes.   ? ?He has not had any CP ?  ?February 26, 2015 ?Thomas [name] is a 81 y.o. male who presents for pre op visit for left hip replacement.  ?He had his last hip replacment WHILE on full dose Plavix and had no bleeding .  ?He has no CP or dyspnea.  ?He is limited by his hip problems.  ? ?February 29, 2016: ? ?Doing well.   Had another hip replacement in May .  ?Had the surgery while on Plavix . ?No CP ?Stays active.  Walks a mile a day  ? ?February 28, 2017: ? ?Thomas [name] is seen back today for f/u of his CAD ?No CP or dyspnea.  ?Exercising regularly  ? ?April 02, 2018: ? ?Thomas [name] is seen today for follow up of his CAD , hypertension, hyperlipidemia. ?Walks some,  2 miles a day - perhaps twice a week ,  ?No CP o r dyspnea.   No dizziness.  ?He  is on aspirin 325 mg a day.  We will have him decrease his dose to 81 mg a day. ?Also refill his nitroglycerin. ? ? ?Oct. 2, 2020  ? ?[name] is seen today for follow-up of his coronary artery disease, hypertension and hyperlipidemia. ? ?HR has been slow for the past month or so. ?Has been in bigeminy. ( so his reported slow HR is actually bigeminy PVCs)  ? ? has had shortness of breath - more dyspnea over the past month  ?Echo shows normal LV systolic function with EF 60-65% .   Could not assess diastolic function  ? ?He has CAD - his symptoms were just dyspnea.   Has never had any angina .  ? ?Was recently admitted for near syncope thought to be  volume depletion  ? ?Jan. 12, 2021: ?Seen for follow up of his CAD , HTN, hyperllipidemia  ?Had bigeminy and we set him up to see Dr. Ladona Ridgel. ?He was stated on amiodarone which has helped .  Is sleeping better  ? ?Jan. 20, 2022 ?Thomas [name] is seen today for follow up of his CAD, HTN, HLD  ?He is on lifelong plavix ?Was walking regularly until this most recent  snow / ice ?No angina  ?I reviewed labs from Dr. Jeannetta Nap office.  His total cholesterol is 126 ?Triglyceride level is 78 ?HDL is 55 ?LDL is 56. ?His labs look good.  We will continue with current medications. ? ?April 04, 2022:  ?Thomas [name] is seen today for a follow-up visit.[name] ?He needs to have a lithotripsy. ?He is on lifelong plavix  ? ? ? ?Past Medical History:  ?Diagnosis Date  ? Arthritis   ? Bifascicular bundle branch block   ? 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 Coronary artery disease   ? takes Plavix and ASA daily  ? GERD (gastroesophageal reflux disease)   ? occasional and will take Tums if needed  ? History of blood clots 2008  ? in chest  ? History of kidney stones late 90's  ? Hyperlipidemia   ? takes Simvastatin daily  ? Hypertension   ? takes Maxzide daily  ? Hypothyroidism   ? takes Synthroid daily  ? Joint pain   ? Joint swelling   ? left knee  ? Pneumonia   ? at 81 years old, double  ? ? ?Past Surgical History:   ?Procedure Laterality Date  ? ARTERY BIOPSY Left 12/15/2021  ? Procedure: LEFT TEMPORAL ARTERY BIOPSY;  Surgeon: Leonie Douglas, MD;  Location: [facility] OR;  Service: Vascular;  Laterality: Left;  ? CARDIAC CATHETERIZATION    ? CHOLECYSTECTOMY N/A 06/06/2018  ? Procedure: LAPAROSCOPIC CHOLECYSTECTOMY WITH INTRAOPERATIVE CHOLANGIOGRAM;  Surgeon: Manus Rudd, MD;  Location[facility];  Service: General;  Laterality: N/A;  ? CORONARY ANGIOPLASTY  05/21/2007  ? lad stent/taxus stent  ? CYSTOSCOPY WITH RETROGRADE PYELOGRAM, URETEROSCOPY AND STENT PLACEMENT Right 03/29/2022  ? Procedure: CYSTOSCOPY WITH RETROGRADE PYELOGRAM AND STENT PLACEMENT;  Surgeon: Crista Elliot, MD;  Location: [facility]S;  Service: Urology;  Laterality: Right;  ? HEMORROIDECTOMY  late 1970's  ? HERNIA REPAIR Right 1980's  ? TOTAL HIP ARTHROPLASTY Right 10/28/2013  ? Procedure: RIGHT TOTAL HIP ARTHROPLASTY ANTERIOR APPROACH;  Surgeon: Loanne Drilling, MD;  Location: [facility]S;  Service: Orthopedics;  Laterality: Right;  ? TOTAL HIP ARTHROPLASTY Left 05/08/2015  ? Procedure: LEFT TOTAL HIP ARTHROPLASTY ANTERIOR APPROACH;  Surgeon: Ollen Gross, MD;  Location: [facility] OR;  Service: Orthopedics;  Laterality: Left;  ? ? ? ?Current Outpatient Medications  ?Medication Sig Dispense Refill  ? amiodarone (PACERONE) 200 MG tablet TAKE 1 TABLET BY MOUTH DAILY EXCEPT SUNDAY. DO NOT TAKE ON SUNDAY 90 tablet 3  ? aspirin EC 81 MG tablet Take 1 tablet (81 mg total) by mouth daily.    ? AZELASTINE HCL NA Place 1 spray into the nose daily as needed (congestion).    ? clopidogrel (PLAVIX) 75 MG tablet TAKE 1 TABLET BY MOUTH DAILY WITH BREAKFAST 30 tablet 0  ? levothyroxine (SYNTHROID, LEVOTHROID) 150 MCG tablet Take 150 mcg by mouth daily.    ? lisinopril (ZESTRIL) 10 MG tablet Take 10 mg by mouth daily.    ? ondansetron (ZOFRAN) 4 MG tablet Take 1 tablet (4 mg total) by mouth every 6 (six) hours. 12 tablet 0  ? rosuvastatin (CRESTOR) 10 MG tablet Take 10 mg by mouth daily.    ?  sulfamethoxazole-trimethoprim (BACTRIM DS) 800-160 MG tablet Take 1 tablet by mouth 2 (two) times daily. 20 tablet 0  ? tamsulosin (FLOMAX) 0.4 MG CAPS capsule Take 1 capsule (0.4 mg total) by mouth daily. 14 capsule 0  ? HYDROcodone-acetaminophen (NORCO) 5-325 MG tablet Take 1 tablet by mouth every 6 (six)  hours as needed for moderate pain. (Patient not taking: Reported on 04/04/2022) 12 tablet 0  ? ?No current facility-administered medications for this visit.  ? ? ?Allergies:   Robaxin [methocarbamol], Roxicodone [oxycodone], and Ultram [tramadol]  ? ? ?Social History:  The patient  reports that he quit smoking about 7 years ago. His smoking use included cigarettes. He has a 13.00 pack-year smoking history. He has never used smokeless tobacco. He reports that he does not currently use alcohol. He reports that he does not use drugs.  ? ?Family History:  The patient's family history includes CAD in his mother; Heart failure in his father and mother; Pneumonia in his father.  ? ? ?ROS:  ?Noted in current history, otherwise review of systems is negative. ? ? ?   ? ? ?EKG:   April 04, 2022: Normal sinus rhythm with occasional premature atrial contraction and occasional premature ventricular contraction.  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 ? ?Recent Labs: ?03/29/2022: BUN 19; Creatinine, Ser 1.76; Hemoglobin 12.5; Platelets 286; Potassium 3.1; Sodium 137  ? ? ?Lipid Panel ?   ?Component Value Date/Time  ? CHOL 109 04/02/2018 0919  ? TRIG 61 06/04/2018 2357  ? HDL 35 (L) 04/02/2018 0919  ? CHOLHDL 3.1 04/02/2018 0919  ? CHOLHDL 4.1 02/29/2016 0839  ? VLDL 36 (H) 02/29/2016 0839  ? LDLCALC 54 04/02/2018 0919  ? ?  ? ?Wt Readings from Last 3 Encounters:  ?04/04/22 204 lb 6.4 oz (92.7 kg)  ?03/29/22 207 lb 12.8 oz (94.3 kg)  ?12/07/21 205 lb (93 kg)  ?  ? ? ?Other studies Reviewed: ?Additional studies/ records that were reviewed today include: . ?Review of the above records demonstrates:   ? ? ?ASSESSMENT AND PLAN: ? ?1. Coronary artery disease-   ?He is not having any episodes of angina.  Continue current medications. ? ? ?2. Bigeminy :   ? ?2. Essential hypertension  -blood pressure is well controlle

## 2022-04-04 NOTE — Telephone Encounter (Signed)
Mr. Joung has a history of coronary artery disease and is on lifelong Plavix. ? ?  I would like for him to continue Plavix through his surgery since stopping Plavix would place him at risk for stent thrombosis.  ? ?He is at low risk for his upcoming urologic procedure as long as he is able to continue Plavix through his surgery. ? ? ? ?Mertie Moores, MD  ?04/04/2022 3:26 PM    ?[place] ?[address],  [address] ?[place], [place]  [address] ?Phone: [phone]; Fax: [phone]  ? ? ? ?

## 2022-04-04 NOTE — Patient Instructions (Addendum)
Medication Instructions:  ?Your physician recommends that you continue on your current medications as directed. Please refer to the Current Medication list given to you today. ? ?*If you need a refill on your cardiac medications before your next appointment, please call your pharmacy* ? ? ?Lab Work: ?NONE ?If you have labs (blood work) drawn today and your tests are completely normal, you will receive your results only by: ?MyChart Message (if you have MyChart) OR ?A paper copy in the mail ?If you have any lab test that is abnormal or we need to change your treatment, we will call you to review the results. ? ? ?Testing/Procedures: ?NONE ? ? ?Follow-Up: ?At [facility], you and your health needs are our priority.  As part of our continuing mission to provide you with exceptional heart care, we have created designated Provider Care Teams.  These Care Teams include your primary Cardiologist (physician) and Advanced Practice Providers (APPs -  Physician Assistants and Nurse Practitioners) who all work together to provide you with the care you need, when you need it. ? ?Your next appointment:   ?1 year(s) ? ?The format for your next appointment:   ?In Person ? ?Provider:   ?Philip Nahser, MD  or Vin Bhagat, PA-C, Michelle Swinyer, NP, or Scott Weaver, PA-C      ? ?  ? ?Important Information About Sugar ? ? ? ? ?  ?

## 2022-04-05 NOTE — Progress Notes (Addendum)
For Short Stay: ?COVID SWAB appointment date: N/A ?Date of COVID positive in last 90 days: N/A ? ?Bowel Prep reminder:N/A ? ? ?For Anesthesia: ?PCP - Kaleen Mask, MD ?Cardiologist - Nahser, Deloris Ping, MD last office visit 04/04/22 in epic ? ?Chest x-ray - greater than 1 year in epic ?EKG - 04/03/22 in epic ?Stress Test - greater than 2 years in epic ?ECHO - greater than 2 years in epic ?Cardiac Cath - N/A ?Pacemaker/ICD device last checked: N/A ?Pacemaker orders received:N/A ?Device Rep notified:N/A ? ?Spinal Cord Stimulator:N/A ? ?Sleep Study - N/A ?CPAP - N/A ? ?Fasting Blood Sugar - N/A ?Checks Blood Sugar ___N/A__ times a day ?Date and result of last Hgb A1c-N/A ? ?Blood Thinner Instructions: Plavix: continue Plavix during this procedure. See note in epic 04/04/22 ?Aspirin Instructions:  ?Last Dose: ? ?Activity level:  activities of daily living without stopping and without chest pain and/or shortness of breath ?     ? ?Anesthesia review: CAD, Bifas. BBB, Bigeminy, HTN, History of blood clots ? ?Patient denies shortness of breath, fever, cough and chest pain at PAT appointment ? ? ?Patient verbalized understanding of instructions that were given to them at the PAT appointment. Patient was also instructed that they will need to review over the PAT instructions again at home before surgery.  ?

## 2022-04-06 ENCOUNTER — Other Ambulatory Visit: Payer: Self-pay

## 2022-04-06 ENCOUNTER — Encounter (HOSPITAL_COMMUNITY): Payer: Self-pay | Admitting: Urology

## 2022-04-06 NOTE — Discharge Summary (Signed)
Physician Discharge Summary  ?Patient ID: ?Thomas [name] ?MRN: [id number] ?DOB/AGE: [birth year] 81 y.o. ? ?Admit date: 03/29/2022 ?Discharge date: 04/01/2022 ? ?Admission Diagnoses: ? ?Discharge Diagnoses:  ?Principal Problem: ?  UTI (urinary tract infection) ?Right ureteral calculus ? ?Discharged Condition: good ? ?Hospital Course: 81 year old male admitted with UTI and right ureteral calculus status post right ureteral stent placement.  Urine culture came back as detailed below and he was discharged on Bactrim in stable condition on postoperative day 2. ? ?Consults: None ? ?Significant Diagnostic Studies: microbiology: urine culture: positive for Staphylococcus epidermidis ? ?Resistant to ciprofloxacin, gentamicin, oxacillin, tetracycline, and intermediate sensitivity to levofloxacin.  Sensitive to daptomycin, nitrofurantoin, rifampin, Bactrim, vancomycin ? ?Treatments: surgery: As above, antibiotics ceftriaxone and then discharged on Bactrim ? ?Discharge Exam: ?Blood pressure (!) 152/78, pulse (!) 52, temperature 97.9 ?F (36.6 ?C), temperature source Oral, resp. rate 20, height 6' (1.829 m), weight 94.3 kg, SpO2 99 %. ?General appearance: alert, no acute distress ?Adequate perfusion of extremities ?Abdomen soft, nontender, nondistended ? ?Disposition: Discharge disposition: 01-Home or Self Care ? ? ? ? ? ?We will get him scheduled for follow-up surgery. ? ?Discharge Instructions   ? ? No wound care   Complete by: As directed ?  ? ?  ? ?Allergies as of 04/01/2022   ? ?   Reactions  ? Robaxin [methocarbamol] Nausea Only  ? Roxicodone [oxycodone] Nausea Only  ? Ultram [tramadol] Nausea Only  ? ?  ? ?  ?Medication List  ?  ? ?TAKE these medications   ? ?amiodarone 200 MG tablet ?Commonly known as: PACERONE ?TAKE 1 TABLET BY MOUTH DAILY EXCEPT SUNDAY. DO NOT TAKE ON SUNDAY ?  ?aspirin EC 81 MG tablet ?Take 1 tablet (81 mg total) by mouth daily. ?  ?AZELASTINE HCL NA ?Place 1 spray into the nose daily as needed  (congestion). ?  ?HYDROcodone-acetaminophen 5-325 MG tablet ?Commonly known as: Norco ?Take 1 tablet by mouth every 6 (six) hours as needed for moderate pain. ?  ?levothyroxine 150 MCG tablet ?Commonly known as: SYNTHROID ?Take 150 mcg by mouth daily. ?  ?lisinopril 10 MG tablet ?Commonly known as: ZESTRIL ?Take 10 mg by mouth daily. ?  ?ondansetron 4 MG tablet ?Commonly known as: ZOFRAN ?Take 1 tablet (4 mg total) by mouth every 6 (six) hours. ?  ?rosuvastatin 10 MG tablet ?Commonly known as: CRESTOR ?Take 10 mg by mouth daily. ?  ?sulfamethoxazole-trimethoprim 800-160 MG tablet ?Commonly known as: BACTRIM DS ?Take 1 tablet by mouth 2 (two) times daily. ?  ?tamsulosin 0.4 MG Caps capsule ?Commonly known as: Flomax ?Take 1 capsule (0.4 mg total) by mouth daily. ?  ? ?  ? ? ? ?Signed: ?Ray Church, III ?04/06/2022, 8:16 AM ? ? ?

## 2022-04-07 NOTE — Progress Notes (Signed)
Anesthesia Chart Review ? ? Case: [id number] Date/Time: 04/18/22 0845  ? Procedure: CYSTOSCOPY\ RIGHT URETEROSCOPY/HOLMIUM LASER/STENT PLACEMENT (Right) - 60 MINS FOR CASE  ? Anesthesia type: General  ? Pre-op diagnosis: RIGHT URETERAL STONE  ? [facility]: [facility]  ? Surgeons: Crista Elliot, MD  ? ?  ? ? ?DISCUSSION:80 y.o. former smoker with h/o HTN, GERD, CAD (Stent), hypothyroidism, right ureteral stone scheduled for above procedure 04/18/2022 with Dr. Modena Slater.  ? ?Pt last seen by cardiology 04/04/2022. Per OV note, "Kidney stones: He needs to have a catheter based lithotripsy.  He is already had a stent placed.  He is at low risk for his upcoming lithotripsy procedure.  He is on lifelong Plavix for previous stenting.  I would like for him to continue Plavix during this procedure." ? ?Anticipate pt can proceed with planned procedure barring acute status change.   ?VS: Ht 6' (1.829 m)   Wt 88 kg   BMI 26.31 kg/m?  ? ?PROVIDERS: ?Kaleen Mask, MD is PCP  ? ?Kristeen Miss, MD is Cardiologist  ? ?LABS: Labs reviewed: Acceptable for surgery. ?(all labs ordered are listed, but only abnormal results are displayed) ? ?Labs Reviewed - No data to display ? ? ?IMAGES: ? ? ?EKG: ?04/04/2022 ?Rate 66 bpm  ?NSR, PACs, PVCs ? ?CV: ?Myocardial Perfusion 09/20/2019 ?There was no ST segment deviation noted during stress. ?No T wave inversion was noted during stress. ?The study is normal. ?This is a low risk study. ?  ?Low risk stress nuclear study with normal perfusion. ?The study could not be gated due to frequent PVCs. ? ?Echo 07/02/2019 ?1. The left ventricle has normal systolic function with an ejection  ?fraction of 60-65%. The cavity size was normal. There is moderate  ?asymmetric left ventricular hypertrophy. Left ventricular diastolic  ?function could not be evaluated due to  ?nondiagnostic images. No evidence of left ventricular regional wall motion  ?abnormalities.  ? 2. Left atrial size was  mild-moderately dilated.  ? 3. The aortic valve is tricuspid. Mild sclerosis of the aortic valve.  ?Aortic valve regurgitation is trivial by color flow Doppler.  ? 4. The aortic root and ascending aorta are normal in size and structure.  ?Past Medical History:  ?Diagnosis Date  ? Arthritis   ? Bifascicular bundle branch block   ? 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 Coronary artery disease   ? takes Plavix and ASA daily  ? GERD (gastroesophageal reflux disease)   ? occasional and will take Tums if needed  ? History of blood clots 2008  ? in chest  ? History of kidney stones late 90's  ? Hyperlipidemia   ? takes Simvastatin daily  ? Hypertension   ? takes Maxzide daily  ? Hypothyroidism   ? takes Synthroid daily  ? Joint pain   ? Joint swelling   ? left knee  ? Pneumonia   ? at 81 years old, double  ? ? ?Past Surgical History:  ?Procedure Laterality Date  ? ARTERY BIOPSY Left 12/15/2021  ? Procedure: LEFT TEMPORAL ARTERY BIOPSY;  Surgeon: Leonie Douglas, MD;  Location: [facility] OR;  Service: Vascular;  Laterality: Left;  ? CARDIAC CATHETERIZATION    ? CHOLECYSTECTOMY N/A 06/06/2018  ? Procedure: LAPAROSCOPIC CHOLECYSTECTOMY WITH INTRAOPERATIVE CHOLANGIOGRAM;  Surgeon: Manus Rudd, MD;  Location: [facility] OR;  Service: General;  Laterality: N/A;  ? CORONARY ANGIOPLASTY  05/21/2007  ? lad stent/taxus stent  ? CYSTOSCOPY WITH RETROGRADE PYELOGRAM, URETEROSCOPY AND STENT  PLACEMENT Right 03/29/2022  ? Procedure: CYSTOSCOPY WITH RETROGRADE PYELOGRAM AND STENT PLACEMENT;  Surgeon: Crista Elliot, MD;  Location: [facility];  Service: Urology;  Laterality: Right;  ? HEMORROIDECTOMY  late 1970's  ? HERNIA REPAIR Right 1980's  ? TOTAL HIP ARTHROPLASTY Right 10/28/2013  ? Procedure: RIGHT TOTAL HIP ARTHROPLASTY ANTERIOR APPROACH;  Surgeon: Loanne Drilling, MD;  Location: [facility];  Service: Orthopedics;  Laterality: Right;  ? TOTAL HIP ARTHROPLASTY Left 05/08/2015  ? Procedure: LEFT TOTAL HIP ARTHROPLASTY ANTERIOR APPROACH;  Surgeon: Ollen Gross, MD;   Location: [facility] OR;  Service: Orthopedics;  Laterality: Left;  ? ? ?MEDICATIONS: ?No current facility-administered medications for this encounter.  ? ? amiodarone (PACERONE) 200 MG tablet  ? aspirin EC 81 MG tablet  ? AZELASTINE HCL NA  ? clopidogrel (PLAVIX) 75 MG tablet  ? HYDROcodone-acetaminophen (NORCO) 5-325 MG tablet  ? levothyroxine (SYNTHROID, LEVOTHROID) 150 MCG tablet  ? lisinopril (ZESTRIL) 10 MG tablet  ? ondansetron (ZOFRAN) 4 MG tablet  ? rosuvastatin (CRESTOR) 10 MG tablet  ? sulfamethoxazole-trimethoprim (BACTRIM DS) 800-160 MG tablet  ? tamsulosin (FLOMAX) 0.4 MG CAPS capsule  ? ? ? ?Jodell Cipro Ward, PA-C ?[facility] ?[phone] ? ? ? ? ? ?

## 2022-04-14 ENCOUNTER — Other Ambulatory Visit (HOSPITAL_COMMUNITY): Payer: Medicare Other

## 2022-04-18 ENCOUNTER — Ambulatory Visit (HOSPITAL_COMMUNITY)
Admission: RE | Admit: 2022-04-18 | Discharge: 2022-04-18 | Disposition: A | Discharge status: Home / Self Care | Payer: Medicare Other | Attending: Urology | Admitting: Urology

## 2022-04-18 ENCOUNTER — Other Ambulatory Visit: Payer: Self-pay

## 2022-04-18 ENCOUNTER — Ambulatory Visit (HOSPITAL_COMMUNITY): Payer: Medicare Other

## 2022-04-18 ENCOUNTER — Emergency Department (HOSPITAL_COMMUNITY)
Admission: EM | Admit: 2022-04-18 | Discharge: 2022-04-19 | Disposition: A | Discharge status: Home / Self Care | Payer: Medicare Other | Source: Home / Self Care | Attending: Emergency Medicine | Admitting: Emergency Medicine

## 2022-04-18 ENCOUNTER — Emergency Department (HOSPITAL_COMMUNITY): Payer: Medicare Other

## 2022-04-18 ENCOUNTER — Encounter (HOSPITAL_COMMUNITY): Payer: Self-pay | Admitting: Urology

## 2022-04-18 ENCOUNTER — Encounter (HOSPITAL_COMMUNITY)
Admission: RE | Disposition: A | Discharge status: Home / Self Care | Payer: Self-pay | Source: Home / Self Care | Attending: Urology

## 2022-04-18 ENCOUNTER — Ambulatory Visit (HOSPITAL_BASED_OUTPATIENT_CLINIC_OR_DEPARTMENT_OTHER): Payer: Medicare Other | Admitting: Physician Assistant

## 2022-04-18 ENCOUNTER — Encounter (HOSPITAL_COMMUNITY): Payer: Self-pay

## 2022-04-18 ENCOUNTER — Ambulatory Visit (HOSPITAL_COMMUNITY): Payer: Medicare Other | Admitting: Physician Assistant

## 2022-04-18 DIAGNOSIS — N189 Chronic kidney disease, unspecified: Secondary | ICD-10-CM | POA: Insufficient documentation

## 2022-04-18 DIAGNOSIS — Z955 Presence of coronary angioplasty implant and graft: Secondary | ICD-10-CM | POA: Insufficient documentation

## 2022-04-18 DIAGNOSIS — E039 Hypothyroidism, unspecified: Secondary | ICD-10-CM

## 2022-04-18 DIAGNOSIS — N179 Acute kidney failure, unspecified: Secondary | ICD-10-CM | POA: Insufficient documentation

## 2022-04-18 DIAGNOSIS — E871 Hypo-osmolality and hyponatremia: Secondary | ICD-10-CM | POA: Insufficient documentation

## 2022-04-18 DIAGNOSIS — M1990 Unspecified osteoarthritis, unspecified site: Secondary | ICD-10-CM | POA: Diagnosis not present

## 2022-04-18 DIAGNOSIS — R338 Other retention of urine: Secondary | ICD-10-CM

## 2022-04-18 DIAGNOSIS — N132 Hydronephrosis with renal and ureteral calculous obstruction: Secondary | ICD-10-CM | POA: Insufficient documentation

## 2022-04-18 DIAGNOSIS — N202 Calculus of kidney with calculus of ureter: Secondary | ICD-10-CM

## 2022-04-18 DIAGNOSIS — I2510 Atherosclerotic heart disease of native coronary artery without angina pectoris: Secondary | ICD-10-CM

## 2022-04-18 DIAGNOSIS — I10 Essential (primary) hypertension: Secondary | ICD-10-CM | POA: Insufficient documentation

## 2022-04-18 DIAGNOSIS — R310 Gross hematuria: Secondary | ICD-10-CM | POA: Insufficient documentation

## 2022-04-18 DIAGNOSIS — D759 Disease of blood and blood-forming organs, unspecified: Secondary | ICD-10-CM | POA: Diagnosis not present

## 2022-04-18 DIAGNOSIS — I129 Hypertensive chronic kidney disease with stage 1 through stage 4 chronic kidney disease, or unspecified chronic kidney disease: Secondary | ICD-10-CM | POA: Insufficient documentation

## 2022-04-18 DIAGNOSIS — Z7982 Long term (current) use of aspirin: Secondary | ICD-10-CM | POA: Insufficient documentation

## 2022-04-18 DIAGNOSIS — Z79899 Other long term (current) drug therapy: Secondary | ICD-10-CM | POA: Insufficient documentation

## 2022-04-18 DIAGNOSIS — D7589 Other specified diseases of blood and blood-forming organs: Secondary | ICD-10-CM | POA: Insufficient documentation

## 2022-04-18 HISTORY — PX: CYSTOSCOPY/URETEROSCOPY/HOLMIUM LASER/STENT PLACEMENT: SHX6546

## 2022-04-18 LAB — CBC
HCT: 46.4 % (ref 39.0–52.0)
HCT: 39.7 % (ref 39.0–52.0)
Hemoglobin: 14.1 g/dL (ref 13.0–17.0)
Hemoglobin: 13.3 g/dL (ref 13.0–17.0)
MCH: 34.7 pg — ABNORMAL HIGH (ref 26.0–34.0)
MCH: 35.1 pg — ABNORMAL HIGH (ref 26.0–34.0)
MCHC: 30.4 g/dL (ref 30.0–36.0)
MCHC: 33.5 g/dL (ref 30.0–36.0)
MCV: 114.3 fL — ABNORMAL HIGH (ref 80.0–100.0)
MCV: 104.7 fL — ABNORMAL HIGH (ref 80.0–100.0)
Platelets: 253 10*3/uL (ref 150–400)
Platelets: 327 10*3/uL (ref 150–400)
RBC: 4.06 MIL/uL — ABNORMAL LOW (ref 4.22–5.81)
RBC: 3.79 MIL/uL — ABNORMAL LOW (ref 4.22–5.81)
RDW: 13.8 % (ref 11.5–15.5)
RDW: 13.8 % (ref 11.5–15.5)
WBC: 4.9 10*3/uL (ref 4.0–10.5)
WBC: 7.4 10*3/uL (ref 4.0–10.5)
nRBC: 0 % (ref 0.0–0.2)
nRBC: 0 % (ref 0.0–0.2)

## 2022-04-18 LAB — BASIC METABOLIC PANEL
Anion gap: 11 (ref 5–15)
Anion gap: 11 (ref 5–15)
BUN: 19 mg/dL (ref 8–23)
BUN: 25 mg/dL — ABNORMAL HIGH (ref 8–23)
CO2: 21 mmol/L — ABNORMAL LOW (ref 22–32)
CO2: 19 mmol/L — ABNORMAL LOW (ref 22–32)
Calcium: 9.1 mg/dL (ref 8.9–10.3)
Calcium: 9.1 mg/dL (ref 8.9–10.3)
Chloride: 107 mmol/L (ref 98–111)
Chloride: 104 mmol/L (ref 98–111)
Creatinine, Ser: 1.58 mg/dL — ABNORMAL HIGH (ref 0.61–1.24)
Creatinine, Ser: 2.13 mg/dL — ABNORMAL HIGH (ref 0.61–1.24)
GFR, Estimated: 44 mL/min — ABNORMAL LOW (ref >60)
GFR, Estimated: 31 mL/min — ABNORMAL LOW (ref >60)
Glucose, Bld: 108 mg/dL — ABNORMAL HIGH (ref 70–99)
Glucose, Bld: 163 mg/dL — ABNORMAL HIGH (ref 70–99)
Potassium: 3.7 mmol/L (ref 3.5–5.1)
Potassium: 3.7 mmol/L (ref 3.5–5.1)
Sodium: 139 mmol/L (ref 135–145)
Sodium: 134 mmol/L — ABNORMAL LOW (ref 135–145)

## 2022-04-18 SURGERY — CYSTOSCOPY/URETEROSCOPY/HOLMIUM LASER/STENT PLACEMENT
Anesthesia: General | Laterality: Right

## 2022-04-18 MED ORDER — EPHEDRINE SULFATE-NACL 50-0.9 MG/10ML-% IV SOSY
PREFILLED_SYRINGE | INTRAVENOUS | Status: DC | PRN
  Administered 2022-04-18 (×2): 10 mg via INTRAVENOUS

## 2022-04-18 MED ORDER — PROPOFOL 10 MG/ML IV BOLUS
INTRAVENOUS | Status: AC
  Filled 2022-04-18: qty 20

## 2022-04-18 MED ORDER — SULFAMETHOXAZOLE-TRIMETHOPRIM 800-160 MG PO TABS: 1.0000 | ORAL_TABLET | Freq: Two times a day (BID) | ORAL | 0 refills | Status: DC

## 2022-04-18 MED ORDER — CEFAZOLIN SODIUM-DEXTROSE 2-4 GM/100ML-% IV SOLN
INTRAVENOUS | Status: AC
  Filled 2022-04-18: qty 100

## 2022-04-18 MED ORDER — CHLORHEXIDINE GLUCONATE 0.12 % MT SOLN
15.0000 mL | Freq: Once | OROMUCOSAL | Status: AC
  Administered 2022-04-18: 15 mL via OROMUCOSAL

## 2022-04-18 MED ORDER — IOHEXOL 300 MG/ML  SOLN
INTRAMUSCULAR | Status: DC | PRN
  Administered 2022-04-18: 6 mL

## 2022-04-18 MED ORDER — FENTANYL CITRATE PF 50 MCG/ML IJ SOSY: 25.0000 ug | PREFILLED_SYRINGE | INTRAMUSCULAR | Status: DC | PRN

## 2022-04-18 MED ORDER — CEFAZOLIN SODIUM-DEXTROSE 2-4 GM/100ML-% IV SOLN
2.0000 g | INTRAVENOUS | Status: AC
  Administered 2022-04-18: 2 g via INTRAVENOUS

## 2022-04-18 MED ORDER — PROPOFOL 10 MG/ML IV BOLUS
INTRAVENOUS | Status: DC | PRN
  Administered 2022-04-18: 200 mg via INTRAVENOUS

## 2022-04-18 MED ORDER — ONDANSETRON HCL 4 MG/2ML IJ SOLN
INTRAMUSCULAR | Status: DC | PRN
Start: 2022-04-18 — End: 2022-04-18
  Administered 2022-04-18: 4 mg via INTRAVENOUS

## 2022-04-18 MED ORDER — EPHEDRINE 5 MG/ML INJ
INTRAVENOUS | Status: AC
  Filled 2022-04-18: qty 5

## 2022-04-18 MED ORDER — AMISULPRIDE (ANTIEMETIC) 5 MG/2ML IV SOLN: 10.0000 mg | Freq: Once | INTRAVENOUS | Status: DC | PRN

## 2022-04-18 MED ORDER — ONDANSETRON HCL 4 MG/2ML IJ SOLN: 4.0000 mg | Freq: Once | INTRAMUSCULAR | Status: DC | PRN

## 2022-04-18 MED ORDER — 0.9 % SODIUM CHLORIDE (POUR BTL) OPTIME
TOPICAL | Status: DC | PRN
  Administered 2022-04-18: 1000 mL

## 2022-04-18 MED ORDER — DEXAMETHASONE SODIUM PHOSPHATE 10 MG/ML IJ SOLN
INTRAMUSCULAR | Status: DC | PRN
  Administered 2022-04-18: 10 mg via INTRAVENOUS

## 2022-04-18 MED ORDER — LACTATED RINGERS IV SOLN: INTRAVENOUS | Status: DC

## 2022-04-18 MED ORDER — SODIUM CHLORIDE 0.9 % IR SOLN
Status: DC | PRN
  Administered 2022-04-18: 3000 mL

## 2022-04-18 MED ORDER — ORAL CARE MOUTH RINSE: 15.0000 mL | Freq: Once | OROMUCOSAL | Status: AC

## 2022-04-18 MED ORDER — FENTANYL CITRATE (PF) 100 MCG/2ML IJ SOLN
INTRAMUSCULAR | Status: AC
  Filled 2022-04-18: qty 2

## 2022-04-18 MED ORDER — LIDOCAINE HCL (CARDIAC) PF 100 MG/5ML IV SOSY
PREFILLED_SYRINGE | INTRAVENOUS | Status: DC | PRN
  Administered 2022-04-18: 50 mg via INTRAVENOUS

## 2022-04-18 MED ORDER — ONDANSETRON HCL 4 MG/2ML IJ SOLN
INTRAMUSCULAR | Status: AC
  Filled 2022-04-18: qty 2

## 2022-04-18 MED ORDER — FENTANYL CITRATE (PF) 100 MCG/2ML IJ SOLN
INTRAMUSCULAR | Status: DC | PRN
  Administered 2022-04-18: 25 ug via INTRAVENOUS

## 2022-04-18 MED ORDER — DEXAMETHASONE SODIUM PHOSPHATE 10 MG/ML IJ SOLN
INTRAMUSCULAR | Status: AC
  Filled 2022-04-18: qty 1

## 2022-04-18 SURGICAL SUPPLY — 25 items
BAG URO CATCHER STRL LF (MISCELLANEOUS) ×3 IMPLANT
BASKET LASER NITINOL 1.9FR (BASKET) IMPLANT
BASKET ZERO TIP NITINOL 2.4FR (BASKET) ×1 IMPLANT
BSKT STON RTRVL 120 1.9FR (BASKET)
BSKT STON RTRVL ZERO TP 2.4FR (BASKET) ×1
CATH URETL OPEN END 6FR 70 (CATHETERS) ×3 IMPLANT
CLOTH BEACON ORANGE TIMEOUT ST (SAFETY) ×3 IMPLANT
EXTRACTOR STONE 1.7FRX115CM (UROLOGICAL SUPPLIES) IMPLANT
GLOVE BIO SURGEON STRL SZ7.5 (GLOVE) ×3 IMPLANT
GOWN STRL REUS W/ TWL XL LVL3 (GOWN DISPOSABLE) ×2 IMPLANT
GOWN STRL REUS W/TWL XL LVL3 (GOWN DISPOSABLE) ×2
GUIDEWIRE ANG ZIPWIRE 038X150 (WIRE) IMPLANT
GUIDEWIRE STR DUAL SENSOR (WIRE) ×4 IMPLANT
KIT TURNOVER KIT A (KITS) IMPLANT
LASER FIB FLEXIVA PULSE ID 365 (Laser) IMPLANT
MANIFOLD NEPTUNE II (INSTRUMENTS) ×3 IMPLANT
PACK CYSTO (CUSTOM PROCEDURE TRAY) ×3 IMPLANT
SHEATH NAVIGATOR HD 11/13X28 (SHEATH) IMPLANT
SHEATH NAVIGATOR HD 11/13X36 (SHEATH) IMPLANT
SHEATH NAVIGATOR HD 12/14X36 (SHEATH) ×1 IMPLANT
STENT URET 6FRX26 CONTOUR (STENTS) ×1 IMPLANT
TRACTIP FLEXIVA PULS ID 200XHI (Laser) IMPLANT
TRACTIP FLEXIVA PULSE ID 200 (Laser)
TUBING CONNECTING 10 (TUBING) ×3 IMPLANT
TUBING UROLOGY SET (TUBING) ×3 IMPLANT

## 2022-04-18 NOTE — Discharge Instructions (Addendum)

## 2022-04-18 NOTE — Anesthesia Postprocedure Evaluation (Signed)
Anesthesia Post Note ? ?Patient: Thomas [name] ? ?Procedure(s) Performed: CYSTOSCOPY\ RIGHT URETEROSCOPY/STONE BASKET EXTRACTION/STENT PLACEMENT (Right) ? ?  ? ?Patient location during evaluation: PACU ?Anesthesia Type: General ?Level of consciousness: awake ?Pain management: pain level controlled ?Vital Signs Assessment: post-procedure vital signs reviewed and stable ?Respiratory status: spontaneous breathing, nonlabored ventilation, respiratory function stable and patient connected to nasal cannula oxygen ?Cardiovascular status: blood pressure returned to baseline and stable ?Postop Assessment: no apparent nausea or vomiting ?Anesthetic complications: no ? ? ?No notable events documented. ? ?Last Vitals:  ?Vitals:  ? 04/18/22 1000 04/18/22 1013  ?BP: (!) 152/78 (!) 147/93  ?Pulse: (!) 59 72  ?Resp: (!) 27 16  ?Temp:    ?SpO2: 96% 96%  ?  ?Last Pain:  ?Vitals:  ? 04/18/22 1013  ?TempSrc:   ?PainSc: 0-No pain  ? ? ?  ?  ?  ?  ?  ?  ? ?Marisella Puccio P Shanta Hartner ? ? ? ? ?

## 2022-04-18 NOTE — H&P (Signed)
CC/HPI: CC: Right ureteral calculus  ?HPI:  ?03/29/2022  ?81 year old male with history of nephrolithiasis. He has always passed them on his own. He went to the emergency department on 03/24/2022. He was found to have a 5 mm mid to distal right ureteral calculus with upstream hydronephrosis. He was prescribed tamsulosin, hydrocodone, Zofran. Yesterday, he was having some dizziness and hallucinations. He was told to stop his tamsulosin. This has resolved today. Denies any fever. He does have some dysuria.  ? ?  ?ALLERGIES: Oxycodone-Acetaminophen ?Robaxin ?Ultram ?  ? ?MEDICATIONS: Aspirin 81 mg tablet,chewable  ?Lisinopril 10 mg tablet  ?Amiodarone Hcl 200 mg tablet  ?Clopidogrel 75 mg tablet  ?Rosuvastatin Calcium 10 mg tablet  ?Vitamin B12  ?  ? ?GU PSH: None  ? ?NON-GU PSH: Cholecystectomy (laparoscopic) ?Hip Replacement, Bilateral ? ?  ? ?GU PMH: None  ? ?NON-GU PMH: Heart disease, unspecified ?Hypercholesterolemia ?Hypertension ?Hypothyroidism ?  ? ?FAMILY HISTORY: 3 daughters - Other ?Heart Attack - Mother ?Heart Disease - Brother  ? ?SOCIAL HISTORY: Marital Status: Married ?Preferred Language: English; Race: White ?Current Smoking Status: Patient has never smoked.  ? ?Tobacco Use Assessment Completed: Used Tobacco in last 30 days? ?Does not drink caffeine. ?  ? ?REVIEW OF SYSTEMS:    ?GU Review Male:   Patient reports frequent urination, hard to postpone urination, burning/ pain with urination, get up at night to urinate, and leakage of urine. Patient denies stream starts and stops, trouble starting your stream, have to strain to urinate , erection problems, and penile pain.  ?Gastrointestinal (Upper):   Patient reports nausea. Patient denies vomiting and indigestion/ heartburn.  ?Gastrointestinal (Lower):   Patient denies diarrhea and constipation.  ?Constitutional:   Patient denies fever, night sweats, weight loss, and fatigue.  ?Skin:   Patient denies skin rash/ lesion and itching.  ?Eyes:   Patient denies  blurred vision and double vision.  ?Ears/ Nose/ Throat:   Patient denies sore throat and sinus problems.  ?Hematologic/Lymphatic:   Patient reports easy bruising. Patient denies swollen glands.  ?Cardiovascular:   Patient denies leg swelling and chest pains.  ?Respiratory:   Patient reports cough. Patient denies shortness of breath.  ?Endocrine:   Patient denies excessive thirst.  ?Musculoskeletal:   Patient reports back pain. Patient denies joint pain.  ?Neurological:   Patient reports headaches and dizziness.   ?Psychologic:   Patient denies depression and anxiety.  ? ?Notes: hematuria ?  ? ?VITAL SIGNS:    ?  03/29/2022 09:14 AM  ?Weight 207 lb / 93.89 kg  ?Height 72 in / 182.88 cm  ?BP 147/79 mmHg  ?Heart Rate 80 /min  ?BMI 28.1 kg/m?  ? ?MULTI-SYSTEM PHYSICAL EXAMINATION:    ?Constitutional: Well-nourished. No physical deformities. Normally developed. Good grooming.  ?Respiratory: No labored breathing, no use of accessory muscles.   ?Cardiovascular: Normal temperature, normal extremity pulses, no swelling, no varicosities.  ?Skin: No paleness, no jaundice, no cyanosis. No lesion, no ulcer, no rash.  ?Neurologic / Psychiatric: Oriented to time, oriented to place, oriented to person. No depression, no anxiety, no agitation.  ?Gastrointestinal: No mass, no tenderness, no rigidity, non obese abdomen.  ?Eyes: Normal conjunctivae. Normal eyelids.  ?Musculoskeletal: Normal gait and station of head and neck.  ? ?  ?Complexity of Data:  ?Source Of History:  Patient  ?Records Review:   Previous Doctor Records, Previous Patient Records  ?Urine Test Review:   Urinalysis  ? ?PROCEDURES:    ?     KUB - 37902  ?  A single view of the abdomen is obtained.  ?  ?  ?Patient confirmed No Neulasta OnPro Device.  ?  ? ? ?     Urinalysis w/Scope ?Dipstick Dipstick Cont'd Micro  ?Color: Amber Bilirubin: Neg mg/dL WBC/hpf: >60/hpf  ?Appearance: Turbid Ketones: 1+ mg/dL RBC/hpf: 20 - 40/hpf  ?Specific Gravity: 1.020 Blood: 3+ ery/uL  Bacteria: Few (10-25/hpf)  ?pH: 6.0 Protein: 3+ mg/dL Cystals: NS (Not Seen)  ?Glucose: Neg mg/dL Urobilinogen: 0.2 mg/dL Casts: NS (Not Seen)  ?  Nitrites: Neg Trichomonas: Not Present  ?  Leukocyte Esterase: 3+ leu/uL Mucous: Not Present  ?    Epithelial Cells: 0 - 5/hpf  ?    Yeast: NS (Not Seen)  ?    Sperm: Not Present  ? ? ?Notes: Unspun micro due to clarity ?  ? ? ?     Ceftriaxone 1g - 96372, [id number] ?Qty: 1 Adm. By: Erica Wheless  ?Unit: gram Lot No [id number]  ?Route: IM Exp. Date 04/12/2023  ?Freq: None Mfgr.:   ?Site: Right Hip  ? ?ASSESSMENT:  ?    ICD-10 Details  ?1 GU:   Ureteral calculus - N20.1 Undiagnosed New Problem  ?2   Dysuria - R30.0 Undiagnosed New Problem  ? ?PLAN:    ? ?      Orders ?Labs Urine Culture  ?X-Rays: KUB  ? ? ?      Schedule ?Procedure: Unspecified Date - Cystoscopy Insert Stent - 52332, right  ? ? ?      Document ?Letter(s):  Created for Patient: Clinical Summary  ? ? ?     Notes:   Urine culture today. 1 g of Rocephin given.  ? ?Patient is having some hallucinations at home. Though these have improved, this is consistent with possible UTI. Also may be secondary to medication side effects but I am concerned about obstructing stone with UTI. I discussed the option of admission to the hospital with antibiotics and stent placement. He would like to proceed with urgent ureteral stent placement today.  ? ?CC: Dr. Elkins  ? ? ?Signed by Nayana Lenig, III, M.D. on 03/29/22 at 10:21 AM (EDT ?

## 2022-04-18 NOTE — Op Note (Signed)
Operative Note ? ?Preoperative diagnosis:  ?1.  Right renal and ureteral calculi ? ?Postoperative diagnosis: ?1.  Right renal and ureteral calculi ? ?Procedure(s): ?1.  Cystoscopy with right retrograde pyelogram, right ureteroscopy with ureteral stone extraction and renal stone extraction, ureteral stent exchange ? ?Surgeon: Link Snuffer, MD ? ?Assistants: None ? ?Anesthesia: General ? ?Complications: None immediate ? ?EBL: Minimal ? ?Specimens: ?1.  Renal calculus ? ?Drains/Catheters: ?1.  6 x 26 double-J ureteral stent ? ?Intraoperative findings: 1.  Normal anterior urethra 2.  Borderline obstructing prostate 3.  Normal bladder mucosa 4.  5 mm distal right ureteral calculus basket extracted.  There was another renal calculus that was basket extracted.  Another Randall's plaque but I did not identify any other stone fragments. ? ?Indication: 81 year old male status post stent placement for UTI and ureteral calculus.  He presents for the previously mentioned operation. ? ?Description of procedure: ? ?The patient was identified and consent was obtained.  The patient was taken to the operating room and placed in the supine position.  The patient was placed under general anesthesia.  Perioperative antibiotics were administered.  The patient was placed in dorsal lithotomy.  Patient was prepped and draped in a standard sterile fashion and a timeout was performed. ? ?A 21 French rigid cystoscope was advanced into the urethra and into the bladder.  Complete cystoscopy was performed with no abnormal findings.  The stent was grasped and pulled just beyond the urethral meatus.  A wire was advanced through this and into the kidney under fluoroscopic guidance.  The stent was withdrawn.  Wire was secured to the drape.  Semirigid ureteroscopy was performed up to the stone which was able to be basket extracted.  This was collected.  I readvanced the scope alongside the wire and up the ureter up to the renal pelvis.  No other stones  were identified.  I advanced a second wire through the scope and into the kidney and withdrew the scope.  A 12 x 14 ureteral access sheath was advanced over the wire under continuous fluoroscopic guidance up to the kidney.  Inner sheath and wire were withdrawn.  Digital ureteroscopy was performed and the stone was basket extracted.  I inspected the remainder of the kidney and did not identify any obvious stones.  He did have a Randall's plaque but nothing to basket extract.  I shot a retrograde pyelogram through the scope with the findings noted above.  I withdrew the scope and the access sheath visualizing the ureter upon removal.  Again there were no ureteral calculi seen.  There was some ureteral edema at the level of stone impaction but no ureteral injury was seen.  I backloaded the wire onto rigid cystoscope and advanced that into the bladder followed by routine placement of a 6 x 26 double-J ureteral stent.  Fluoroscopy confirmed proximal placement and direct visualization confirmed a good coil within the bladder.  I drained the bladder withdrew the scope.  Patient tolerated the procedure well was stable postoperative. ? ?Plan: Follow-up in 4 to 7 days for stent removal ? ?

## 2022-04-18 NOTE — Transfer of Care (Signed)
Immediate Anesthesia Transfer of Care Note ? ?Patient: Thomas [name] ? ?Procedure(s) Performed: CYSTOSCOPY\ RIGHT URETEROSCOPY/STONE BASKET EXTRACTION/STENT PLACEMENT (Right) ? ?Patient Location: PACU ? ?Anesthesia Type:General ? ?Level of Consciousness: sedated ? ?Airway & Oxygen Therapy: Patient Spontanous Breathing ? ?Post-op Assessment: Report given to RN, Post -op Vital signs reviewed and stable and Post -op Vital signs reviewed and unstable, Anesthesiologist notified ? ?Post vital signs: Reviewed and stable ? ?Last Vitals:  ?Vitals Value Taken Time  ?BP    ?Temp    ?Pulse    ?Resp    ?SpO2 100 % 04/18/22 0936  ?Vitals shown include unvalidated device data. ? ?Last Pain:  ?Vitals:  ? 04/18/22 0712  ?TempSrc:   ?PainSc: 0-No pain  ?   ? ?  ? ?Complications: No notable events documented. ?

## 2022-04-18 NOTE — ED Triage Notes (Signed)
Patient was seen at [facility] recently for urinary stent surgery. Today he was seen for kidney stone removal. Patient reports penile pain and inability to void today. Patient states he is only able to void in small amounts and his urine is re in color. Patent takes plavix daily.  ?

## 2022-04-18 NOTE — Anesthesia Preprocedure Evaluation (Signed)
Anesthesia Evaluation  ?Patient identified by MRN, date of birth, ID band ?Patient awake ? ? ? ?Reviewed: ?Allergy & Precautions, NPO status , Patient's Chart, lab work & pertinent test results ? ?Airway ?Mallampati: II ? ?TM Distance: >3 FB ?Neck ROM: Full ? ? ? Dental ?no notable dental hx. ? ?  ?Pulmonary ?former smoker,  ?  ?Pulmonary exam normal ? ? ? ? ? ? ? Cardiovascular ?hypertension, Pt. on medications ?+ CAD and + Cardiac Stents  ?Normal cardiovascular exam+ dysrhythmias  ? ? ?  ?Neuro/Psych ?negative neurological ROS ? negative psych ROS  ? GI/Hepatic ?negative GI ROS, Neg liver ROS,   ?Endo/Other  ?Hypothyroidism  ? Renal/GU ?Renal disease  ? ?  ?Musculoskeletal ? ?(+) Arthritis ,  ? Abdominal ?  ?Peds ? Hematology ? ?(+) Blood dyscrasia, ,   ?Anesthesia Other Findings ?RIGHT URETERAL STONE ? Reproductive/Obstetrics ? ?  ? ? ? ? ? ? ? ? ? ? ? ? ? ?  ?  ? ? ? ? ? ? ? ? ?Anesthesia Physical ?Anesthesia Plan ? ?ASA: 3 ? ?Anesthesia Plan: General  ? ?Post-op Pain Management:   ? ?Induction: Intravenous ? ?PONV Risk Score and Plan: 2 and Ondansetron, Dexamethasone and Treatment may vary due to age or medical condition ? ?Airway Management Planned: LMA ? ?Additional Equipment:  ? ?Intra-op Plan:  ? ?Post-operative Plan: Extubation in OR ? ?Informed Consent: I have reviewed the patients History and Physical, chart, labs and discussed the procedure including the risks, benefits and alternatives for the proposed anesthesia with the patient or authorized representative who has indicated his/her understanding and acceptance.  ? ? ? ?Dental advisory given ? ?Plan Discussed with: CRNA ? ?Anesthesia Plan Comments:   ? ? ? ? ? ? ?Anesthesia Quick Evaluation ? ?

## 2022-04-18 NOTE — Anesthesia Procedure Notes (Signed)
Procedure Name: LMA Insertion ?Date/Time: 04/18/2022 8:41 AM ?Performed by: Doran Clay, CRNA ?Pre-anesthesia Checklist: Patient identified, Emergency Drugs available, Suction available, Patient being monitored and Timeout performed ?Patient Re-evaluated:Patient Re-evaluated prior to induction ?Oxygen Delivery Method: Circle system utilized ?Preoxygenation: Pre-oxygenation with 100% oxygen ?Induction Type: IV induction ?LMA: LMA inserted ?LMA Size: 5.0 ?Tube type: Oral ?Number of attempts: 1 ?Placement Confirmation: positive ETCO2 and breath sounds checked- equal and bilateral ?Tube secured with: Tape ?Dental Injury: Teeth and Oropharynx as per pre-operative assessment  ? ? ? ? ?

## 2022-04-18 NOTE — OR Nursing (Signed)
Stone taken by Dr. Bell. 

## 2022-04-18 NOTE — ED Provider Notes (Signed)
?[facility]-EMERGENCY DEPT ?Provider Note ? ? ?CSN: [id number] ?Arrival date & time: 04/18/22  2241 ? ?  ? ?History ? ?Chief Complaint  ?Patient presents with  ? Urinary Frequency  ? Hematuria  ? ? ?Thomas [name] is a 81 y.o. male. ? ?The history is provided by the patient.  ?Urinary Frequency ? ?Hematuria ?He has history of hypertension, hyperlipidemia, coronary artery disease, chronic kidney disease who had surgery today for removal of kidney stones on the right and states that he is unable to urinate.  He passes a small amount of urine that is red and has pain near the tip of his penis.  Pain is worse if he is supine, better if he stands up.  He denies fever or chills.  He denies abdominal pain or flank pain. ?  ?Home Medications ?Prior to Admission medications   ?Medication Sig Start Date End Date Taking? Authorizing Provider  ?acetaminophen (TYLENOL) 500 MG tablet Take 500 mg by mouth every 6 (six) hours as needed for moderate pain.    [provider]  ?amiodarone (PACERONE) 200 MG tablet TAKE 1 TABLET BY MOUTH DAILY EXCEPT SUNDAY. DO NOT TAKE ON SUNDAY 09/27/21   Marinus Maw, MD  ?aspirin EC 81 MG tablet Take 1 tablet (81 mg total) by mouth daily. 04/02/18   Nahser, Deloris Ping, MD  ?azelastine (ASTELIN) 0.1 % nasal spray Place 1 spray into both nostrils daily as needed for rhinitis. Use in each nostril as directed    [provider]  ?clopidogrel (PLAVIX) 75 MG tablet Take 1 tablet (75 mg total) by mouth daily with breakfast. 04/04/22   Nahser, Deloris Ping, MD  ?HYDROcodone-acetaminophen (NORCO) 5-325 MG tablet Take 1 tablet by mouth every 6 (six) hours as needed for moderate pain. ?Patient not taking: Reported on 04/04/2022 03/25/22   Edwin Dada P, DO  ?levothyroxine (SYNTHROID, LEVOTHROID) 150 MCG tablet Take 150 mcg by mouth daily. 01/04/18   [provider]  ?lisinopril (ZESTRIL) 10 MG tablet Take 10 mg by mouth daily. 05/21/20   [provider]   ?ondansetron (ZOFRAN) 4 MG tablet Take 1 tablet (4 mg total) by mouth every 6 (six) hours. ?Patient not taking: Reported on 04/12/2022 03/25/22   Edwin Dada P, DO  ?rosuvastatin (CRESTOR) 10 MG tablet Take 10 mg by mouth daily.    [provider]  ?sulfamethoxazole-trimethoprim (BACTRIM DS) 800-160 MG tablet Take 1 tablet by mouth 2 (two) times daily. 04/18/22   Crista Elliot, MD  ?tamsulosin (FLOMAX) 0.4 MG CAPS capsule Take 1 capsule (0.4 mg total) by mouth daily. ?Patient not taking: Reported on 04/12/2022 03/25/22   Edwin Dada P, DO  ?vitamin B-12 (CYANOCOBALAMIN) 500 MCG tablet Take 500 mcg by mouth daily.    [provider]  ?   ? ?Allergies    ?Hydrocodone, Robaxin [methocarbamol], Roxicodone [oxycodone], and Ultram [tramadol]   ? ?Review of Systems   ?Review of Systems  ?Genitourinary:  Positive for frequency and hematuria.  ?All other systems reviewed and are negative. ? ?Physical Exam ?Updated Vital Signs ?BP (!) 143/88 (BP Location: Left Arm)   Pulse 94   Temp 98 ?F (36.7 ?C) (Oral)   Resp 18   SpO2 100%  ?Physical Exam ?Vitals and nursing note reviewed.  ?81 year old male, resting comfortably and in no acute distress. Vital signs are significant for mildly elevated blood pressure. Oxygen saturation is 100%, which is normal. ?Head is normocephalic and atraumatic. PERRLA, EOMI. Oropharynx  is clear. ?Neck is nontender and supple without adenopathy or JVD. ?Back is nontender and there is no CVA tenderness. ?Lungs are clear without rales, wheezes, or rhonchi. ?Chest is nontender. ?Heart has regular rate and rhythm without murmur. ?Abdomen is soft, flat, nontender.  Bladder not significantly distended. ?Genitalia: Circumcised penis, small amount of bloody urine noted at the urethral meatus but no palpable calculi inside the penile urethra. ?Extremities have no cyanosis or edema, full range of motion is present. ?Skin is warm and dry without rash. ?Neurologic: Mental status is normal,  cranial nerves are intact, moves all extremities equally. ? ?ED Results / Procedures / Treatments   ?Labs ?(all labs ordered are listed, but only abnormal results are displayed) ?Labs Reviewed  ?URINALYSIS, ROUTINE W REFLEX MICROSCOPIC - Abnormal; Notable for the following components:  ?    Result Value  ? Color, Urine RED (*)   ? APPearance TURBID (*)   ? Glucose, UA   (*)   ? Value: TEST NOT REPORTED DUE TO COLOR INTERFERENCE OF URINE PIGMENT  ? Hgb urine dipstick   (*)   ? Value: TEST NOT REPORTED DUE TO COLOR INTERFERENCE OF URINE PIGMENT  ? Bilirubin Urine   (*)   ? Value: TEST NOT REPORTED DUE TO COLOR INTERFERENCE OF URINE PIGMENT  ? Ketones, ur   (*)   ? Value: TEST NOT REPORTED DUE TO COLOR INTERFERENCE OF URINE PIGMENT  ? Protein, ur   (*)   ? Value: TEST NOT REPORTED DUE TO COLOR INTERFERENCE OF URINE PIGMENT  ? Nitrite   (*)   ? Value: TEST NOT REPORTED DUE TO COLOR INTERFERENCE OF URINE PIGMENT  ? Leukocytes,Ua   (*)   ? Value: TEST NOT REPORTED DUE TO COLOR INTERFERENCE OF URINE PIGMENT  ? All other components within normal limits  ?BASIC METABOLIC PANEL - Abnormal; Notable for the following components:  ? Sodium 134 (*)   ? CO2 19 (*)   ? Glucose, Bld 163 (*)   ? BUN 25 (*)   ? Creatinine, Ser 2.13 (*)   ? GFR, Estimated 31 (*)   ? All other components within normal limits  ?CBC - Abnormal; Notable for the following components:  ? RBC 3.79 (*)   ? MCV 104.7 (*)   ? MCH 35.1 (*)   ? All other components within normal limits  ?URINALYSIS, MICROSCOPIC (REFLEX) - Abnormal; Notable for the following components:  ? Bacteria, UA FEW (*)   ? All other components within normal limits  ?URINE CULTURE  ? ?Radiology ?DG C-Arm 1-60 Min-No Report ? ?Result Date: 04/18/2022 ?Fluoroscopy was utilized by the requesting physician.  No radiographic interpretation.  ? ?CT Renal Stone Study ? ?Result Date: 04/19/2022 ?CLINICAL DATA:  Penile pain. Unable to void. Kidney stone removal today. EXAM: CT ABDOMEN AND PELVIS WITHOUT  CONTRAST TECHNIQUE: Multidetector CT imaging of the abdomen and pelvis was performed following the standard protocol without IV contrast. RADIATION DOSE REDUCTION: This exam was performed according to the departmental dose-optimization program which includes automated exposure control, adjustment of the mA and/or kV according to patient size and/or use of iterative reconstruction technique. COMPARISON:  03/24/2022 FINDINGS: Lower chest: No acute abnormality Hepatobiliary: No focal liver abnormality is seen. Status post cholecystectomy. No biliary dilatation. Pancreas: No focal abnormality or ductal dilatation. Spleen: No focal abnormality.  Normal size. Adrenals/Urinary Tract: Adrenal glands are unremarkable. Right ureteral stent is in place. Mild right hydronephrosis. High density material is seen within the right collecting  system and ureter, likely related to earlier retrograde pyelogram/intraoperative imaging. High density material also seen within the bladder. Foley catheter within the bladder. No hydronephrosis on the left. Bilateral nonobstructing nephrolithiasis. Adrenal glands unremarkable. Stomach/Bowel: Normal appendix. Stomach, large and small bowel grossly unremarkable. Vascular/Lymphatic: Diffuse aortic atherosclerosis. No evidence of aneurysm or adenopathy. Reproductive: No visible focal abnormality. Other: No free fluid or free air. Musculoskeletal: Bilateral hip replacements. Degenerative changes in the lumbar spine. No acute bony abnormality. IMPRESSION: Right ureteral stent in place with mild right hydronephrosis. Previously seen mid right ureteral stone not visualized. High-density material within the right renal collecting system, ureter and bladder presumably from earlier intraoperative imaging/pyelogram. Bilateral nephrolithiasis. Foley catheter within the bladder which is decompressed. Aortic atherosclerosis. Electronically Signed   By: Charlett Nose M.D.   On: 04/19/2022 00:31    ? ?Procedures ?Procedures  ? ? ?Medications Ordered in ED ?Medications - No data to display ? ?ED Course/ Medical Decision Making/ A&P ?  ?                        ?Medical Decision Making ?Amount and/or Complexity of Data Review

## 2022-04-19 ENCOUNTER — Encounter (HOSPITAL_COMMUNITY): Payer: Self-pay | Admitting: Urology

## 2022-04-19 LAB — URINALYSIS, ROUTINE W REFLEX MICROSCOPIC

## 2022-04-19 LAB — URINALYSIS, MICROSCOPIC (REFLEX)
RBC / HPF: >50 RBC/hpf (ref 0–5)
Squamous Epithelial / HPF: NONE SEEN (ref 0–5)

## 2022-04-19 NOTE — Discharge Instructions (Signed)
Drink plenty of fluids. ? ?Return if you start running a fever, or if pain is not being adequately controlled at home. ?

## 2022-04-20 LAB — URINE CULTURE: Culture: NO GROWTH

## 2022-07-18 ENCOUNTER — Other Ambulatory Visit: Payer: Self-pay

## 2022-07-18 ENCOUNTER — Encounter (HOSPITAL_COMMUNITY): Payer: Self-pay | Admitting: Emergency Medicine

## 2022-07-18 ENCOUNTER — Emergency Department (HOSPITAL_COMMUNITY)
Admission: EM | Admit: 2022-07-18 | Discharge: 2022-07-19 | Disposition: A | Discharge status: Home / Self Care | Payer: Medicare Other | Attending: Emergency Medicine | Admitting: Emergency Medicine

## 2022-07-18 ENCOUNTER — Emergency Department (HOSPITAL_COMMUNITY): Payer: Medicare Other

## 2022-07-18 DIAGNOSIS — R531 Weakness: Secondary | ICD-10-CM | POA: Insufficient documentation

## 2022-07-18 DIAGNOSIS — Z7902 Long term (current) use of antithrombotics/antiplatelets: Secondary | ICD-10-CM | POA: Insufficient documentation

## 2022-07-18 DIAGNOSIS — Z79899 Other long term (current) drug therapy: Secondary | ICD-10-CM | POA: Diagnosis not present

## 2022-07-18 DIAGNOSIS — Z7982 Long term (current) use of aspirin: Secondary | ICD-10-CM | POA: Insufficient documentation

## 2022-07-18 DIAGNOSIS — R778 Other specified abnormalities of plasma proteins: Secondary | ICD-10-CM | POA: Diagnosis not present

## 2022-07-18 DIAGNOSIS — I2510 Atherosclerotic heart disease of native coronary artery without angina pectoris: Secondary | ICD-10-CM | POA: Insufficient documentation

## 2022-07-18 DIAGNOSIS — U071 COVID-19: Secondary | ICD-10-CM | POA: Diagnosis not present

## 2022-07-18 DIAGNOSIS — I10 Essential (primary) hypertension: Secondary | ICD-10-CM | POA: Diagnosis not present

## 2022-07-18 DIAGNOSIS — R059 Cough, unspecified: Secondary | ICD-10-CM | POA: Diagnosis present

## 2022-07-18 LAB — URINALYSIS, ROUTINE W REFLEX MICROSCOPIC
Bacteria, UA: NONE SEEN
Bilirubin Urine: NEGATIVE
Glucose, UA: NEGATIVE mg/dL
Ketones, ur: NEGATIVE mg/dL
Leukocytes,Ua: NEGATIVE
Nitrite: NEGATIVE
Protein, ur: NEGATIVE mg/dL
Specific Gravity, Urine: 1.016 (ref 1.005–1.030)
pH: 5 (ref 5.0–8.0)

## 2022-07-18 LAB — CBC
HCT: 37.9 % — ABNORMAL LOW (ref 39.0–52.0)
Hemoglobin: 12.6 g/dL — ABNORMAL LOW (ref 13.0–17.0)
MCH: 35.9 pg — ABNORMAL HIGH (ref 26.0–34.0)
MCHC: 33.2 g/dL (ref 30.0–36.0)
MCV: 108 fL — ABNORMAL HIGH (ref 80.0–100.0)
Platelets: 185 10*3/uL (ref 150–400)
RBC: 3.51 MIL/uL — ABNORMAL LOW (ref 4.22–5.81)
RDW: 15.3 % (ref 11.5–15.5)
WBC: 5.2 10*3/uL (ref 4.0–10.5)
nRBC: 0 % (ref 0.0–0.2)

## 2022-07-18 LAB — COMPREHENSIVE METABOLIC PANEL
ALT: 23 U/L (ref 0–44)
AST: 26 U/L (ref 15–41)
Albumin: 3.5 g/dL (ref 3.5–5.0)
Alkaline Phosphatase: 47 U/L (ref 38–126)
Anion gap: 10 (ref 5–15)
BUN: 15 mg/dL (ref 8–23)
CO2: 25 mmol/L (ref 22–32)
Calcium: 8.8 mg/dL — ABNORMAL LOW (ref 8.9–10.3)
Chloride: 101 mmol/L (ref 98–111)
Creatinine, Ser: 1.68 mg/dL — ABNORMAL HIGH (ref 0.61–1.24)
GFR, Estimated: 41 mL/min — ABNORMAL LOW (ref >60)
Glucose, Bld: 96 mg/dL (ref 70–99)
Potassium: 3.6 mmol/L (ref 3.5–5.1)
Sodium: 136 mmol/L (ref 135–145)
Total Bilirubin: 0.8 mg/dL (ref 0.3–1.2)
Total Protein: 6 g/dL — ABNORMAL LOW (ref 6.5–8.1)

## 2022-07-18 NOTE — ED Provider Triage Note (Signed)
Emergency Medicine Provider Triage Evaluation Note  Thomas [name] , a 81 y.o. male  was evaluated in triage.  Pt complains of increasing weakness over the past week and a productive cough over the past 2 days.  Patient states he needs assistance just to stand due to weakness at this point.  He states that he normally ambulates without assistance but has needed a cane over the past week.  He also endorses a productive cough and states that he is unable to catch his breath.  He states he is a former smoker but it has been over 20 years, denies any recent surgical history, any active cancer, or any recent travel.  Patient also denies chest pain  Review of Systems  Positive: Weakness, shortness of breath, cough Negative: Chest pain  Physical Exam  BP (!) 150/60 (BP Location: Right Arm)   Pulse (!) 55   Temp 98.4 F (36.9 C) (Oral)   Resp 20   SpO2 97%  Gen:   Awake, no distress   Resp:  Normal effort  MSK:   Moves extremities without difficulty  Other:    Medical Decision Making  Medically screening exam initiated at 3:51 PM.  Appropriate orders placed.  Thomas [name] was informed that the remainder of the evaluation will be completed by another provider, this initial triage assessment does not replace that evaluation, and the importance of remaining in the ED until their evaluation is complete.     Darrick Grinder, PA-C 07/18/22 1552

## 2022-07-18 NOTE — ED Triage Notes (Signed)
Patient BIB [facility] from home with complaint of productive cough x2 days. Patient also reports fatigue and has been using a cane to walk for the same time which is not something he usually needs. Patient is alert, oriented, speaking in complete sentences, and in no apparent distress at this time.  VSS

## 2022-07-19 ENCOUNTER — Emergency Department (HOSPITAL_COMMUNITY): Payer: Medicare Other

## 2022-07-19 LAB — TROPONIN I (HIGH SENSITIVITY)
Troponin I (High Sensitivity): 35 ng/L — ABNORMAL HIGH (ref <18)
Troponin I (High Sensitivity): 39 ng/L — ABNORMAL HIGH (ref <18)

## 2022-07-19 LAB — SARS CORONAVIRUS 2 BY RT PCR: SARS Coronavirus 2 by RT PCR: POSITIVE — AB

## 2022-07-19 MED ORDER — MOLNUPIRAVIR EUA 200MG CAPSULE: 4.0000 | ORAL_CAPSULE | Freq: Two times a day (BID) | ORAL | 0 refills | Status: AC

## 2022-07-19 NOTE — Discharge Instructions (Signed)
Your evaluation today was positive for COVID-19.  You have been prescribed molnupiravir for treatment.  You may take this with Tylenol for fever or body aches.  Drink plenty of fluids to prevent dehydration.  We recommend close follow-up with your primary doctor.  Return for new or concerning symptoms.

## 2022-07-19 NOTE — ED Notes (Signed)
Pt taken to MRI  

## 2022-07-19 NOTE — ED Provider Notes (Signed)
[facility] EMERGENCY DEPARTMENT Provider Note   CSN: [id number] Arrival date & time: 07/18/22  1538     History  Chief Complaint  Patient presents with   Cough    Thomas [name] is a 81 y.o. male.  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  Reports that his symptoms began fairly acutely yesterday afternoon as a headache to his left frontal region.  He subsequently noticed that he had trouble with his balance and felt increasingly dizzy with change in position.  He has needed to utilize a cane with ambulation due to unsteady gait.  Subsequently developed a cough as well as some shortness of breath.  This is worse with exertion.  He has not had any fevers, sore throat, vomiting, chest pain, extremity weakness.  No prior history of CVA.  The history is provided by the patient. No language interpreter was used.  Cough      Home Medications Prior to Admission medications   Medication Sig Start Date End Date Taking? Authorizing Provider  acetaminophen (TYLENOL) 500 MG tablet Take 500 mg by mouth every 6 (six) hours as needed for moderate pain.    [provider]  amiodarone (PACERONE) 200 MG tablet TAKE 1 TABLET BY MOUTH DAILY EXCEPT SUNDAY. DO NOT TAKE ON SUNDAY 09/27/21   Marinus Maw, MD  aspirin EC 81 MG tablet Take 1 tablet (81 mg total) by mouth daily. 04/02/18   Nahser, Deloris Ping, MD  azelastine (ASTELIN) 0.1 % nasal spray Place 1 spray into both nostrils daily as needed for rhinitis. Use in each nostril as directed    [provider]  clopidogrel (PLAVIX) 75 MG tablet Take 1 tablet (75 mg total) by mouth daily with breakfast. 04/04/22   Nahser, Deloris Ping, MD  HYDROcodone-acetaminophen (NORCO) 5-325 MG tablet Take 1 tablet by mouth every 6 (six) hours as needed for moderate pain. Patient not taking: Reported on 04/04/2022  03/25/22   Edwin Dada P, DO  levothyroxine (SYNTHROID, LEVOTHROID) 150 MCG tablet Take 150 mcg by mouth daily. 01/04/18   [provider]  lisinopril (ZESTRIL) 10 MG tablet Take 10 mg by mouth daily. 05/21/20   [provider]  ondansetron (ZOFRAN) 4 MG tablet Take 1 tablet (4 mg total) by mouth every 6 (six) hours. Patient not taking: Reported on 04/12/2022 03/25/22   Edwin Dada P, DO  rosuvastatin (CRESTOR) 10 MG tablet Take 10 mg by mouth daily.    [provider]  sulfamethoxazole-trimethoprim (BACTRIM DS) 800-160 MG tablet Take 1 tablet by mouth 2 (two) times daily. 04/18/22   Crista Elliot, MD  tamsulosin (FLOMAX) 0.4 MG CAPS capsule Take 1 capsule (0.4 mg total) by mouth daily. Patient not taking: Reported on 04/12/2022 03/25/22   Edwin Dada P, DO  vitamin B-12 (CYANOCOBALAMIN) 500 MCG tablet Take 500 mcg by mouth daily.    [provider]      Allergies    Hydrocodone, Robaxin [methocarbamol], Roxicodone [oxycodone], and Ultram [tramadol]    Review of Systems   Review of Systems  Respiratory:  Positive for cough.   Ten systems reviewed and are negative for acute change, except as noted in the HPI.    Physical Exam Updated Vital Signs BP (!) 153/58   Pulse (!) 49   Temp 99 F (37.2 C) (Oral)   Resp 20   SpO2  96%   Physical Exam Vitals and nursing note reviewed.  Constitutional:      General: He is not in acute distress.    Appearance: He is well-developed. He is not diaphoretic.     Comments: Nontoxic appearing and in NAD  HENT:     Head: Normocephalic and atraumatic.  Eyes:     General: No scleral icterus.    Conjunctiva/sclera: Conjunctivae normal.  Neck:     Comments: No meningismus  Cardiovascular:     Rate and Rhythm: Normal rate and regular rhythm.     Pulses: Normal pulses.  Pulmonary:     Effort: Pulmonary effort is normal. No respiratory distress.     Breath sounds: No stridor. No wheezing.     Comments: Respirations  even and unlabored Musculoskeletal:        General: Normal range of motion.     Cervical back: Normal range of motion.  Skin:    General: Skin is warm and dry.     Coloration: Skin is not pale.     Findings: No erythema or rash.  Neurological:     Mental Status: He is alert and oriented to person, place, and time.     Coordination: Coordination normal.     Comments: GCS 15. Speech is goal oriented. No cranial nerve deficits appreciated; symmetric eyebrow raise, no facial drooping, tongue midline. Patient has equal grip strength bilaterally with 5/5 strength against resistance in all major muscle groups bilaterally. Sensation to light touch intact. No pronator drift. Patient ambulatory with steady gait.  Psychiatric:        Behavior: Behavior normal.     ED Results / Procedures / Treatments   Labs (all labs ordered are listed, but only abnormal results are displayed) Labs Reviewed  SARS CORONAVIRUS 2 BY RT PCR - Abnormal; Notable for the following components:      Result Value   SARS Coronavirus 2 by RT PCR POSITIVE (*)    All other components within normal limits  CBC - Abnormal; Notable for the following components:   RBC 3.51 (*)    Hemoglobin 12.6 (*)    HCT 37.9 (*)    MCV 108.0 (*)    MCH 35.9 (*)    All other components within normal limits  URINALYSIS, ROUTINE W REFLEX MICROSCOPIC - Abnormal; Notable for the following components:   Hgb urine dipstick SMALL (*)    All other components within normal limits  COMPREHENSIVE METABOLIC PANEL - Abnormal; Notable for the following components:   Creatinine, Ser 1.68 (*)    Calcium 8.8 (*)    Total Protein 6.0 (*)    GFR, Estimated 41 (*)    All other components within normal limits  TROPONIN I (HIGH SENSITIVITY) - Abnormal; Notable for the following components:   Troponin I (High Sensitivity) 35 (*)    All other components within normal limits  TROPONIN I (HIGH SENSITIVITY) - Abnormal; Notable for the following components:    Troponin I (High Sensitivity) 39 (*)    All other components within normal limits  CBG MONITORING, ED  TROPONIN I (HIGH SENSITIVITY)    EKG EKG Interpretation  Date/Time:  Tuesday July 19 2022 00:19:00 EDT Ventricular Rate:  50 PR Interval:  68 QRS Duration: 160 QT Interval:  521 QTC Calculation: 476 R Axis:   -62 Text Interpretation: Sinus rhythm Short PR interval RBBB and LAFB Artifact in lead(s) I II aVR Otherwise no significant change Confirmed by Drema Pry [phone]) on 07/19/2022 1:08:07  AM  Radiology MR BRAIN WO CONTRAST  Result Date: 07/19/2022 CLINICAL DATA:  81 year old male with increasing weakness. Dizziness. EXAM: MRI HEAD WITHOUT CONTRAST TECHNIQUE: Multiplanar, multiecho pulse sequences of the brain and surrounding structures were obtained without intravenous contrast. COMPARISON:  None Available. FINDINGS: Brain: No restricted diffusion to suggest acute infarction. No midline shift, mass effect, ventriculomegaly, extra-axial fluid collection or acute intracranial hemorrhage. Cervicomedullary junction and pituitary are within normal limits. 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 No associated mass effect or edema in the adjacent right superior frontal gyrus. Perisylvian cerebral volume loss bilaterally. Some mid brain volume loss. But minimal to mild for age nonspecific scattered cerebral white matter T2 and FLAIR hyperintensity. No cortical encephalomalacia or chronic cerebral blood products identified. Deep gray nuclei and cerebellum are within normal limits for age. Vascular: Major intracranial vascular flow voids are preserved. Skull and upper cervical spine: Negative for age visible cervical spine. Visualized bone marrow signal is within normal limits. Sinuses/Orbits: Postoperative changes to both globes. Otherwise negative orbits. Paranasal sinuses and mastoids are well aerated. Other: Visible internal auditory structures in stylomastoid  foramina appear within normal limits. Negative visible scalp and face. IMPRESSION: 1. No acute intracranial abnormality. Nonspecific cerebral volume loss. 2. 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 Electronically Signed   By: Odessa Fleming M.D.   On: 07/19/2022 06:16   DG Chest 2 View  Result Date: 07/18/2022 CLINICAL DATA:  Productive cough for 2 days EXAM: CHEST - 2 VIEW COMPARISON:  November 03, 2019, July 01, 2019 FINDINGS: Stable cardiomediastinal contours, including tortuous thoracic aorta. Unchanged calcified plaque within the aortic arch. Normal heart size. No focal pulmonary opacity. No large pleural effusion or pneumothorax. No acute osseous abnormality. Cholecystectomy clips visualized in the upper abdomen. IMPRESSION: No active cardiopulmonary disease. Electronically Signed   By: Jacob Moores M.D.   On: 07/18/2022 16:52    Procedures Procedures    Medications Ordered in ED Medications - No data to display  ED Course/ Medical Decision Making/ A&P Clinical Course as of 07/19/22 3235  [facility] Jul 18, 2022  2251 Went to assess. Patient not yet in room. [KH]  Tue Jul 19, 2022  0234 Covid positive.  [KH]  0235 High-sensitivity troponin is 35, up from 25 2 years ago.  Will obtain delta troponin for trending.  This has been ordered. [KH]  0533 Patient at MRI [KH]    Clinical Course User Index [KH] Antony Madura, PA-C                           Medical Decision Making Amount and/or Complexity of Data Reviewed Radiology: ordered.   This patient presents to the ED for concern of gait instability with generalized weakness, SOB, malaise, this involves an extensive number of treatment options, and is a complaint that carries with it a high risk of complications and morbidity.  The differential diagnosis includes CVA vs viral illness vs PNA vs atypical ACS vs dehydration   Co morbidities that complicate the patient evaluation  CAD HTN   Additional  history obtained:  Additional history obtained from wife at bedside External records from outside source obtained and reviewed including outpatient cardiology notes regarding his cardiac hx   Lab Tests:  I Ordered, and personally interpreted labs.  The pertinent results include:  positive COVID test. Troponin 35>39 (last elevated to 23 two years ago). Otherwise stable or  improved CBC, CMP. Negative UA.   Imaging Studies ordered:  I ordered imaging studies including CXR, MRI brain  I independently visualized and interpreted imaging which showed no acute pathology in chest or brain I agree with the radiologist interpretation   Cardiac Monitoring:  The patient was maintained on a cardiac monitor.  I personally viewed and interpreted the cardiac monitored which showed an underlying rhythm of: sinus bradycardia   Medicines ordered and prescription drug management:  I have reviewed the patients home medicines and have made adjustments as needed   Test Considered:  Third troponin   Reevaluation:  After the interventions noted above, I reevaluated the patient and found that they have : remained stable   Social Determinants of Health:  Good social support   Dispostion:  After consideration of the diagnostic results and the patients response to treatment, I feel that the patent would benefit from treatment with molnupiravir and close follow-up with his primary care doctor. Return precautions discussed and provided. Patient discharged in stable condition with no unaddressed concerns.         Final Clinical Impression(s) / ED Diagnoses Final diagnoses:  COVID-19 virus infection    Rx / DC Orders ED Discharge Orders     None         Antony Madura, PA-C 07/19/22 7209    Nira Conn, MD 07/19/22 [phone]

## 2022-07-19 NOTE — ED Notes (Signed)
Patient verbalizes understanding of discharge instructions. Opportunity for questioning and answers were provided. Armband removed by staff, pt discharged from ED via wheelchair.  

## 2022-10-08 ENCOUNTER — Other Ambulatory Visit: Payer: Self-pay | Admitting: Internal Medicine

## 2022-10-23 ENCOUNTER — Other Ambulatory Visit: Payer: Self-pay

## 2022-10-23 ENCOUNTER — Emergency Department (HOSPITAL_COMMUNITY): Payer: Medicare Other

## 2022-10-23 ENCOUNTER — Observation Stay (HOSPITAL_BASED_OUTPATIENT_CLINIC_OR_DEPARTMENT_OTHER): Payer: Medicare Other

## 2022-10-23 ENCOUNTER — Inpatient Hospital Stay (HOSPITAL_COMMUNITY)
Admission: EM | Admit: 2022-10-23 | Discharge: 2022-10-26 | DRG: 286 | Disposition: A | Discharge status: Home / Self Care | Payer: Medicare Other | Attending: Family Medicine | Admitting: Family Medicine

## 2022-10-23 DIAGNOSIS — R0601 Orthopnea: Principal | ICD-10-CM

## 2022-10-23 DIAGNOSIS — I5020 Unspecified systolic (congestive) heart failure: Secondary | ICD-10-CM

## 2022-10-23 DIAGNOSIS — I77810 Thoracic aortic ectasia: Secondary | ICD-10-CM | POA: Diagnosis present

## 2022-10-23 DIAGNOSIS — Z87891 Personal history of nicotine dependence: Secondary | ICD-10-CM

## 2022-10-23 DIAGNOSIS — I2510 Atherosclerotic heart disease of native coronary artery without angina pectoris: Secondary | ICD-10-CM | POA: Diagnosis present

## 2022-10-23 DIAGNOSIS — Z1152 Encounter for screening for COVID-19: Secondary | ICD-10-CM

## 2022-10-23 DIAGNOSIS — Z96643 Presence of artificial hip joint, bilateral: Secondary | ICD-10-CM | POA: Diagnosis present

## 2022-10-23 DIAGNOSIS — Z8249 Family history of ischemic heart disease and other diseases of the circulatory system: Secondary | ICD-10-CM

## 2022-10-23 DIAGNOSIS — I130 Hypertensive heart and chronic kidney disease with heart failure and stage 1 through stage 4 chronic kidney disease, or unspecified chronic kidney disease: Principal | ICD-10-CM | POA: Diagnosis present

## 2022-10-23 DIAGNOSIS — N400 Enlarged prostate without lower urinary tract symptoms: Secondary | ICD-10-CM | POA: Diagnosis present

## 2022-10-23 DIAGNOSIS — Z87442 Personal history of urinary calculi: Secondary | ICD-10-CM

## 2022-10-23 DIAGNOSIS — R918 Other nonspecific abnormal finding of lung field: Secondary | ICD-10-CM | POA: Diagnosis present

## 2022-10-23 DIAGNOSIS — I5021 Acute systolic (congestive) heart failure: Secondary | ICD-10-CM

## 2022-10-23 DIAGNOSIS — Z79899 Other long term (current) drug therapy: Secondary | ICD-10-CM

## 2022-10-23 DIAGNOSIS — E039 Hypothyroidism, unspecified: Secondary | ICD-10-CM | POA: Diagnosis present

## 2022-10-23 DIAGNOSIS — R7989 Other specified abnormal findings of blood chemistry: Secondary | ICD-10-CM | POA: Insufficient documentation

## 2022-10-23 DIAGNOSIS — I5041 Acute combined systolic (congestive) and diastolic (congestive) heart failure: Secondary | ICD-10-CM | POA: Diagnosis present

## 2022-10-23 DIAGNOSIS — Z888 Allergy status to other drugs, medicaments and biological substances status: Secondary | ICD-10-CM

## 2022-10-23 DIAGNOSIS — Z955 Presence of coronary angioplasty implant and graft: Secondary | ICD-10-CM

## 2022-10-23 DIAGNOSIS — R001 Bradycardia, unspecified: Secondary | ICD-10-CM | POA: Diagnosis present

## 2022-10-23 DIAGNOSIS — D539 Nutritional anemia, unspecified: Secondary | ICD-10-CM | POA: Diagnosis present

## 2022-10-23 DIAGNOSIS — N1832 Chronic kidney disease, stage 3b: Secondary | ICD-10-CM | POA: Diagnosis present

## 2022-10-23 DIAGNOSIS — E876 Hypokalemia: Secondary | ICD-10-CM | POA: Diagnosis present

## 2022-10-23 DIAGNOSIS — I4891 Unspecified atrial fibrillation: Secondary | ICD-10-CM | POA: Diagnosis present

## 2022-10-23 DIAGNOSIS — Z7902 Long term (current) use of antithrombotics/antiplatelets: Secondary | ICD-10-CM

## 2022-10-23 DIAGNOSIS — Z7982 Long term (current) use of aspirin: Secondary | ICD-10-CM

## 2022-10-23 DIAGNOSIS — Z79890 Hormone replacement therapy (postmenopausal): Secondary | ICD-10-CM

## 2022-10-23 DIAGNOSIS — Z885 Allergy status to narcotic agent status: Secondary | ICD-10-CM

## 2022-10-23 DIAGNOSIS — I452 Bifascicular block: Secondary | ICD-10-CM | POA: Diagnosis present

## 2022-10-23 DIAGNOSIS — I493 Ventricular premature depolarization: Secondary | ICD-10-CM | POA: Diagnosis present

## 2022-10-23 DIAGNOSIS — R0600 Dyspnea, unspecified: Secondary | ICD-10-CM

## 2022-10-23 DIAGNOSIS — I10 Essential (primary) hypertension: Secondary | ICD-10-CM | POA: Diagnosis present

## 2022-10-23 DIAGNOSIS — E785 Hyperlipidemia, unspecified: Secondary | ICD-10-CM | POA: Diagnosis present

## 2022-10-23 DIAGNOSIS — I509 Heart failure, unspecified: Secondary | ICD-10-CM | POA: Diagnosis not present

## 2022-10-23 DIAGNOSIS — Z86718 Personal history of other venous thrombosis and embolism: Secondary | ICD-10-CM

## 2022-10-23 DIAGNOSIS — Z8616 Personal history of COVID-19: Secondary | ICD-10-CM

## 2022-10-23 LAB — BASIC METABOLIC PANEL
Anion gap: 9 (ref 5–15)
BUN: 15 mg/dL (ref 8–23)
CO2: 26 mmol/L (ref 22–32)
Calcium: 8.4 mg/dL — ABNORMAL LOW (ref 8.9–10.3)
Chloride: 107 mmol/L (ref 98–111)
Creatinine, Ser: 1.41 mg/dL — ABNORMAL HIGH (ref 0.61–1.24)
GFR, Estimated: 50 mL/min — ABNORMAL LOW (ref >60)
Glucose, Bld: 98 mg/dL (ref 70–99)
Potassium: 3.1 mmol/L — ABNORMAL LOW (ref 3.5–5.1)
Sodium: 142 mmol/L (ref 135–145)

## 2022-10-23 LAB — CBC
HCT: 39.9 % (ref 39.0–52.0)
HCT: 38.5 % — ABNORMAL LOW (ref 39.0–52.0)
Hemoglobin: 12.8 g/dL — ABNORMAL LOW (ref 13.0–17.0)
Hemoglobin: 12.2 g/dL — ABNORMAL LOW (ref 13.0–17.0)
MCH: 34.8 pg — ABNORMAL HIGH (ref 26.0–34.0)
MCH: 34.7 pg — ABNORMAL HIGH (ref 26.0–34.0)
MCHC: 32.1 g/dL (ref 30.0–36.0)
MCHC: 31.7 g/dL (ref 30.0–36.0)
MCV: 108.4 fL — ABNORMAL HIGH (ref 80.0–100.0)
MCV: 109.4 fL — ABNORMAL HIGH (ref 80.0–100.0)
Platelets: 213 10*3/uL (ref 150–400)
Platelets: 204 10*3/uL (ref 150–400)
RBC: 3.68 MIL/uL — ABNORMAL LOW (ref 4.22–5.81)
RBC: 3.52 MIL/uL — ABNORMAL LOW (ref 4.22–5.81)
RDW: 15 % (ref 11.5–15.5)
RDW: 15 % (ref 11.5–15.5)
WBC: 5.8 10*3/uL (ref 4.0–10.5)
WBC: 5.6 10*3/uL (ref 4.0–10.5)
nRBC: 0 % (ref 0.0–0.2)
nRBC: 0 % (ref 0.0–0.2)

## 2022-10-23 LAB — SARS CORONAVIRUS 2 BY RT PCR: SARS Coronavirus 2 by RT PCR: NEGATIVE

## 2022-10-23 LAB — PROCALCITONIN: Procalcitonin: 19.8 ng/mL

## 2022-10-23 LAB — ECHOCARDIOGRAM COMPLETE
Area-P 1/2: 3.46 cm2
Calc EF: 42.3 %
MV M vel: 4.69 m/s
MV Peak grad: 87.8 mmHg
S' Lateral: 4.3 cm
Single Plane A2C EF: 43.7 %
Single Plane A4C EF: 40.4 %

## 2022-10-23 LAB — MAGNESIUM: Magnesium: 1.2 mg/dL — ABNORMAL LOW (ref 1.7–2.4)

## 2022-10-23 LAB — TROPONIN I (HIGH SENSITIVITY)
Troponin I (High Sensitivity): 37 ng/L — ABNORMAL HIGH (ref <18)
Troponin I (High Sensitivity): 47 ng/L — ABNORMAL HIGH (ref <18)

## 2022-10-23 LAB — BRAIN NATRIURETIC PEPTIDE: B Natriuretic Peptide: 1287.6 pg/mL — ABNORMAL HIGH (ref 0.0–100.0)

## 2022-10-23 LAB — CREATININE, SERUM
Creatinine, Ser: 1.19 mg/dL (ref 0.61–1.24)
GFR, Estimated: >60 mL/min (ref >60)

## 2022-10-23 LAB — D-DIMER, QUANTITATIVE: D-Dimer, Quant: 0.56 ug/mL-FEU — ABNORMAL HIGH (ref 0.00–0.50)

## 2022-10-23 LAB — TSH: TSH: 2.203 u[IU]/mL (ref 0.350–4.500)

## 2022-10-23 MED ORDER — ONDANSETRON HCL 4 MG PO TABS: 4.0000 mg | ORAL_TABLET | Freq: Four times a day (QID) | ORAL | Status: DC | PRN

## 2022-10-23 MED ORDER — ASPIRIN 81 MG PO TBEC
81.0000 mg | DELAYED_RELEASE_TABLET | Freq: Every day | ORAL | Status: DC
  Administered 2022-10-23 – 2022-10-24 (×2): 81 mg via ORAL
  Filled 2022-10-23 (×2): qty 1

## 2022-10-23 MED ORDER — FUROSEMIDE 10 MG/ML IJ SOLN
40.0000 mg | Freq: Every day | INTRAMUSCULAR | Status: DC
  Administered 2022-10-24: 40 mg via INTRAVENOUS
  Filled 2022-10-23: qty 4

## 2022-10-23 MED ORDER — ONDANSETRON HCL 4 MG/2ML IJ SOLN: 4.0000 mg | Freq: Four times a day (QID) | INTRAMUSCULAR | Status: DC | PRN

## 2022-10-23 MED ORDER — AMIODARONE HCL 200 MG PO TABS: 200.0000 mg | ORAL_TABLET | Freq: Every day | ORAL | Status: DC

## 2022-10-23 MED ORDER — AMIODARONE HCL 200 MG PO TABS
200.0000 mg | ORAL_TABLET | ORAL | Status: DC
  Administered 2022-10-24 – 2022-10-25 (×2): 200 mg via ORAL
  Filled 2022-10-23 (×2): qty 1

## 2022-10-23 MED ORDER — LEVOTHYROXINE SODIUM 50 MCG PO TABS
150.0000 ug | ORAL_TABLET | Freq: Every day | ORAL | Status: DC
  Administered 2022-10-23 – 2022-10-26 (×4): 150 ug via ORAL
  Filled 2022-10-23 (×4): qty 1

## 2022-10-23 MED ORDER — POTASSIUM CHLORIDE CRYS ER 20 MEQ PO TBCR
40.0000 meq | EXTENDED_RELEASE_TABLET | ORAL | Status: AC
  Administered 2022-10-23 (×2): 40 meq via ORAL
  Filled 2022-10-23 (×2): qty 2

## 2022-10-23 MED ORDER — LISINOPRIL 10 MG PO TABS
10.0000 mg | ORAL_TABLET | Freq: Every day | ORAL | Status: DC
  Administered 2022-10-23 – 2022-10-24 (×2): 10 mg via ORAL
  Filled 2022-10-23 (×2): qty 1

## 2022-10-23 MED ORDER — IOHEXOL 350 MG/ML SOLN
100.0000 mL | Freq: Once | INTRAVENOUS | Status: AC | PRN
  Administered 2022-10-23: 100 mL via INTRAVENOUS

## 2022-10-23 MED ORDER — ACETAMINOPHEN 650 MG RE SUPP: 650.0000 mg | Freq: Four times a day (QID) | RECTAL | Status: DC | PRN

## 2022-10-23 MED ORDER — FUROSEMIDE 10 MG/ML IJ SOLN
40.0000 mg | Freq: Once | INTRAMUSCULAR | Status: AC
  Administered 2022-10-23: 40 mg via INTRAVENOUS
  Filled 2022-10-23: qty 4

## 2022-10-23 MED ORDER — ACETAMINOPHEN 500 MG PO TABS: 500.0000 mg | ORAL_TABLET | Freq: Four times a day (QID) | ORAL | Status: DC | PRN

## 2022-10-23 MED ORDER — ENOXAPARIN SODIUM 40 MG/0.4ML IJ SOSY: 40.0000 mg | PREFILLED_SYRINGE | INTRAMUSCULAR | Status: DC

## 2022-10-23 MED ORDER — ROSUVASTATIN CALCIUM 10 MG PO TABS
10.0000 mg | ORAL_TABLET | Freq: Every day | ORAL | Status: DC
  Administered 2022-10-23 – 2022-10-25 (×3): 10 mg via ORAL
  Filled 2022-10-23 (×3): qty 1

## 2022-10-23 MED ORDER — CLOPIDOGREL BISULFATE 75 MG PO TABS
75.0000 mg | ORAL_TABLET | Freq: Every day | ORAL | Status: DC
  Administered 2022-10-24 – 2022-10-26 (×3): 75 mg via ORAL
  Filled 2022-10-23 (×4): qty 1

## 2022-10-23 MED ORDER — ENOXAPARIN SODIUM 40 MG/0.4ML IJ SOSY
40.0000 mg | PREFILLED_SYRINGE | INTRAMUSCULAR | Status: DC
  Administered 2022-10-23 – 2022-10-25 (×3): 40 mg via SUBCUTANEOUS
  Filled 2022-10-23 (×3): qty 0.4

## 2022-10-23 MED ORDER — ACETAMINOPHEN 325 MG PO TABS: 650.0000 mg | ORAL_TABLET | Freq: Four times a day (QID) | ORAL | Status: DC | PRN

## 2022-10-23 NOTE — Progress Notes (Signed)
  Echocardiogram 2D Echocardiogram has been performed.  Thomas [name] 10/23/2022, 3:48 PM

## 2022-10-23 NOTE — ED Provider Notes (Signed)
[facility] Note   CSN: [id number] Arrival date & time: 10/23/22  9735     History {Add pertinent medical, surgical, social history, OB history to HPI:1} Chief Complaint  Patient presents with   Shortness of Breath    BIB by EMS for SOB especially when laying on his back. Pain when breathing in Left flank area. Posterior lungs clear, front lower lobes are rhonchi. HX of AFIB, Aox4.     Thomas [name] is a 81 y.o. male.  HPI     81 year old patient comes in with chief complaint of shortness of breath. Patient has history of CAD, hypertension, hyperlipidemia and history of blood clots in the chest per record in 2008.  Patient is on Plavix.  He had COVID-19 few months back.   He states that last night he started having shortness of breath with laying flat.  When he sits up, he feels better.  He also felt that the left side of his chest was going to blow up when he was laying down.  He denied any chest pain, shortness of breath yesterday, even with exertion.   Patient denies any cough, fevers, chills, wheezing.  He has no primary lung disease. Home Medications Prior to Admission medications   Medication Sig Start Date End Date Taking? Authorizing Provider  acetaminophen (TYLENOL) 500 MG tablet Take 500 mg by mouth every 6 (six) hours as needed for moderate pain.    [provider]  amiodarone (PACERONE) 200 MG tablet TAKE 1 TABLET BY MOUTH DAILY EXCEPT SUNDAY. DO NOT TAKE ON SUNDAY 10/10/22   Marinus Maw, MD  aspirin EC 81 MG tablet Take 1 tablet (81 mg total) by mouth daily. 04/02/18   Nahser, Deloris Ping, MD  azelastine (ASTELIN) 0.1 % nasal spray Place 1 spray into both nostrils daily as needed for rhinitis. Use in each nostril as directed    [provider]  clopidogrel (PLAVIX) 75 MG tablet Take 1 tablet (75 mg total) by mouth daily with breakfast. 04/04/22   Nahser, Deloris Ping, MD  HYDROcodone-acetaminophen (NORCO)  5-325 MG tablet Take 1 tablet by mouth every 6 (six) hours as needed for moderate pain. Patient not taking: Reported on 04/04/2022 03/25/22   Edwin Dada P, DO  levothyroxine (SYNTHROID, LEVOTHROID) 150 MCG tablet Take 150 mcg by mouth daily. 01/04/18   [provider]  lisinopril (ZESTRIL) 10 MG tablet Take 10 mg by mouth daily. 05/21/20   [provider]  ondansetron (ZOFRAN) 4 MG tablet Take 1 tablet (4 mg total) by mouth every 6 (six) hours. Patient not taking: Reported on 04/12/2022 03/25/22   Edwin Dada P, DO  rosuvastatin (CRESTOR) 10 MG tablet Take 10 mg by mouth daily.    [provider]  sulfamethoxazole-trimethoprim (BACTRIM DS) 800-160 MG tablet Take 1 tablet by mouth 2 (two) times daily. 04/18/22   Crista Elliot, MD  tamsulosin (FLOMAX) 0.4 MG CAPS capsule Take 1 capsule (0.4 mg total) by mouth daily. Patient not taking: Reported on 04/12/2022 03/25/22   Edwin Dada P, DO  vitamin B-12 (CYANOCOBALAMIN) 500 MCG tablet Take 500 mcg by mouth daily.    [provider]      Allergies    Hydrocodone, Robaxin [methocarbamol], Roxicodone [oxycodone], and Ultram [tramadol]    Review of Systems   Review of Systems  All other systems reviewed and are negative.   Physical Exam Updated Vital Signs BP (!) 153/82 (BP Location: Left Arm)  Pulse (!) 59   Temp 97.6 F (36.4 C) (Oral)   Resp 18   SpO2 98%  Physical Exam Vitals and nursing note reviewed.  Constitutional:      Appearance: He is well-developed.  HENT:     Head: Atraumatic.  Cardiovascular:     Rate and Rhythm: Normal rate.  Pulmonary:     Effort: Pulmonary effort is normal.     Breath sounds: Examination of the right-lower field reveals rales. Examination of the left-lower field reveals rales. Rales present. No decreased breath sounds, wheezing or rhonchi.  Musculoskeletal:     Cervical back: Neck supple.     Right lower leg: No tenderness. No edema.     Left lower leg: No  tenderness. No edema.  Skin:    General: Skin is warm.  Neurological:     Mental Status: He is alert and oriented to person, place, and time.     ED Results / Procedures / Treatments   Labs (all labs ordered are listed, but only abnormal results are displayed) Labs Reviewed  CBC - Abnormal; Notable for the following components:      Result Value   RBC 3.68 (*)    Hemoglobin 12.8 (*)    MCV 108.4 (*)    MCH 34.8 (*)    All other components within normal limits  BASIC METABOLIC PANEL  BRAIN NATRIURETIC PEPTIDE  D-DIMER, QUANTITATIVE  TROPONIN I (HIGH SENSITIVITY)    EKG None  Radiology DG Chest Port 1 View  Result Date: 10/23/2022 CLINICAL DATA:  81 year old male with history of shortness of breath. Orthopnea. Left-sided chest pain. EXAM: PORTABLE CHEST 1 VIEW COMPARISON:  Chest x-ray 07/18/2022. FINDINGS: Skin fold artifact over the upper right hemithorax. Lung volumes are normal. Mild diffuse interstitial prominence and peribronchial cuffing, most evident in the lung bases. No consolidative airspace disease. No pleural effusions. No pneumothorax. No pulmonary nodule or mass noted. Pulmonary vasculature and the cardiomediastinal silhouette are within normal limits. Atherosclerotic calcifications in the thoracic aorta. IMPRESSION: 1. Diffuse peribronchial cuffing and interstitial prominence concerning for an acute bronchitis. 2. Aortic atherosclerosis. Electronically Signed   By: Trudie Reed M.D.   On: 10/23/2022 08:36    Procedures Procedures  {Document cardiac monitor, telemetry assessment procedure when appropriate:1}  Medications Ordered in ED Medications - No data to display  ED Course/ Medical Decision Making/ A&P                           Medical Decision Making 81 year old male comes in with chief complaint of shortness of breath.  He is essentially having orthopnea and paroxysmal nocturnal dyspnea.  He has history of CAD, no CHF.  He also has bifascicular  bundle block, bigeminy/PVC history and per his records he had " clot in his chest" in 2008, but patient does not recall being on any blood thinners.   Patient on exam has some bibasilar rales.  There is no pitting edema, JVD. Patient has no unilateral leg swelling or signs of DVT. Patient does indicate that his chest pain is worse with deep inspiration.   Amount and/or Complexity of Data Reviewed Labs: ordered. Radiology: ordered.   ***  {Document critical care time when appropriate:1} {Document review of labs and clinical decision tools ie heart score, Chads2Vasc2 etc:1}  {Document your independent review of radiology images, and any outside records:1} {Document your discussion with family members, caretakers, and with consultants:1} {Document social determinants of health affecting pt's  care:1} {Document your decision making why or why not admission, treatments were needed:1} Final Clinical Impression(s) / ED Diagnoses Final diagnoses:  None    Rx / DC Orders ED Discharge Orders     None

## 2022-10-23 NOTE — H&P (Signed)
History and Physical    DEMITRIS POKORNY [name]:[id number] DOB: 1941/11/16 DOA: 10/23/2022  PCP: Kaleen Mask, MD  Patient coming from: Home  I have personally briefly reviewed patient's old medical records in [facility]  Chief Complaint: Shortness of breath  HPI: Thomas [name] is a 81 y.o. male with medical history significant of CAD, hypertension, hyperlipidemia, hypothyroidism and many other comorbidities as listed below presented to ED with a complaint of shortness of breath.  According to patient, he initially had shortness of breath the night before which was brief but it resolved but then last night, he had it again.  Per him, he feels short of breath with laying flat but feels better with sitting up.  No cough, fever, chills, palpitation, chest pain, any problem with urination or with bowel movement.  No recent travel or sick contact.  ED Course: Upon arrival to ED, he was slightly bradycardic and hypertensive.  He was not hypoxic.  BNP was significantly elevated but chest x-ray showed diffuse peribronchial cuffing and interstitial prominence concerning for acute bronchitis.  He was also found to have hypokalemia.  Patient has not been given any replacement for potassium or any Lasix.  Hospital service was consulted for CHF exacerbation admission.  Review of Systems: As per HPI otherwise negative.    Past Medical History:  Diagnosis Date   Arthritis    Bifascicular bundle branch block    Bigeminy 06/2019   Coronary artery disease    takes Plavix and ASA daily   GERD (gastroesophageal reflux disease)    occasional and will take Tums if needed   History of blood clots 2008   in chest   History of kidney stones late 90's   Hyperlipidemia    takes Simvastatin daily   Hypertension    takes Maxzide daily   Hypothyroidism    takes Synthroid daily   Joint pain    Joint swelling    left knee   Pneumonia    at 81 years old, double    Past Surgical History:   Procedure Laterality Date   ARTERY BIOPSY Left 12/15/2021   Procedure: LEFT TEMPORAL ARTERY BIOPSY;  Surgeon: Leonie Douglas, MD;  Location: [facility] OR;  Service: Vascular;  Laterality: Left;   CARDIAC CATHETERIZATION     CHOLECYSTECTOMY N/A 06/06/2018   Procedure: LAPAROSCOPIC CHOLECYSTECTOMY WITH INTRAOPERATIVE CHOLANGIOGRAM;  Surgeon: Manus Rudd, MD;  Location: [facility] OR;  Service: General;  Laterality: N/A;   CORONARY ANGIOPLASTY  05/21/2007   lad stent/taxus stent   CYSTOSCOPY WITH RETROGRADE PYELOGRAM, URETEROSCOPY AND STENT PLACEMENT Right 03/29/2022   Procedure: CYSTOSCOPY WITH RETROGRADE PYELOGRAM AND STENT PLACEMENT;  Surgeon: Crista Elliot, MD;  Location: [facility] ORS;  Service: Urology;  Laterality: Right;   CYSTOSCOPY/URETEROSCOPY/HOLMIUM LASER/STENT PLACEMENT Right 04/18/2022   Procedure: CYSTOSCOPY\ RIGHT URETEROSCOPY/STONE BASKET EXTRACTION/STENT PLACEMENT;  Surgeon: Crista Elliot, MD;  Location: [facility] ORS;  Service: Urology;  Laterality: Right;  60 MINS FOR CASE   HEMORROIDECTOMY  late 1970's   HERNIA REPAIR Right 1980's   TOTAL HIP ARTHROPLASTY Right 10/28/2013   Procedure: RIGHT TOTAL HIP ARTHROPLASTY ANTERIOR APPROACH;  Surgeon: Loanne Drilling, MD;  Location: [facility] ORS;  Service: Orthopedics;  Laterality: Right;   TOTAL HIP ARTHROPLASTY Left 05/08/2015   Procedure: LEFT TOTAL HIP ARTHROPLASTY ANTERIOR APPROACH;  Surgeon: Ollen Gross, MD;  Location: [facility] OR;  Service: Orthopedics;  Laterality: Left;     reports that he quit smoking about 7 years ago. His smoking  use included cigarettes. He has a 13.00 pack-year smoking history. He has never used smokeless tobacco. He reports that he does not currently use alcohol. He reports that he does not use drugs.  Allergies  Allergen Reactions   Hydrocodone     Dizziness and upset stomach   Robaxin [Methocarbamol] Nausea Only   Roxicodone [Oxycodone] Nausea Only   Ultram [Tramadol] Nausea Only    Family History  Problem Relation Age of  Onset   CAD Mother    Heart failure Mother    Pneumonia Father    Heart failure Father     Prior to Admission medications   Medication Sig Start Date End Date Taking? Authorizing Provider  acetaminophen (TYLENOL) 500 MG tablet Take 500 mg by mouth every 6 (six) hours as needed for moderate pain.    [provider]  amiodarone (PACERONE) 200 MG tablet TAKE 1 TABLET BY MOUTH DAILY EXCEPT SUNDAY. DO NOT TAKE ON SUNDAY 10/10/22   Marinus Maw, MD  aspirin EC 81 MG tablet Take 1 tablet (81 mg total) by mouth daily. 04/02/18   Nahser, Deloris Ping, MD  azelastine (ASTELIN) 0.1 % nasal spray Place 1 spray into both nostrils daily as needed for rhinitis. Use in each nostril as directed    [provider]  clopidogrel (PLAVIX) 75 MG tablet Take 1 tablet (75 mg total) by mouth daily with breakfast. 04/04/22   Nahser, Deloris Ping, MD  HYDROcodone-acetaminophen (NORCO) 5-325 MG tablet Take 1 tablet by mouth every 6 (six) hours as needed for moderate pain. Patient not taking: Reported on 04/04/2022 03/25/22   Edwin Dada P, DO  levothyroxine (SYNTHROID, LEVOTHROID) 150 MCG tablet Take 150 mcg by mouth daily. 01/04/18   [provider]  lisinopril (ZESTRIL) 10 MG tablet Take 10 mg by mouth daily. 05/21/20   [provider]  ondansetron (ZOFRAN) 4 MG tablet Take 1 tablet (4 mg total) by mouth every 6 (six) hours. Patient not taking: Reported on 04/12/2022 03/25/22   Edwin Dada P, DO  rosuvastatin (CRESTOR) 10 MG tablet Take 10 mg by mouth daily.    [provider]  sulfamethoxazole-trimethoprim (BACTRIM DS) 800-160 MG tablet Take 1 tablet by mouth 2 (two) times daily. 04/18/22   Crista Elliot, MD  tamsulosin (FLOMAX) 0.4 MG CAPS capsule Take 1 capsule (0.4 mg total) by mouth daily. Patient not taking: Reported on 04/12/2022 03/25/22   Edwin Dada P, DO  vitamin B-12 (CYANOCOBALAMIN) 500 MCG tablet Take 500 mcg by mouth daily.    [provider]    Physical  Exam: Vitals:   10/23/22 0823 10/23/22 0930 10/23/22 0945 10/23/22 1148  BP: (!) 153/82 (!) 159/77 (!) 158/81 (!) 177/82  Pulse: (!) 59 (!) 56 (!) 56 65  Resp: 18 (!) 36 (!) 25 (!) 30  Temp: 97.6 F (36.4 C)   97.9 F (36.6 C)  TempSrc: Oral   Oral  SpO2: 98% 97% 97% 98%    Constitutional: NAD, calm, comfortable Vitals:   10/23/22 0823 10/23/22 0930 10/23/22 0945 10/23/22 1148  BP: (!) 153/82 (!) 159/77 (!) 158/81 (!) 177/82  Pulse: (!) 59 (!) 56 (!) 56 65  Resp: 18 (!) 36 (!) 25 (!) 30  Temp: 97.6 F (36.4 C)   97.9 F (36.6 C)  TempSrc: Oral   Oral  SpO2: 98% 97% 97% 98%   Eyes: PERRL, lids and conjunctivae normal ENMT: Mucous membranes are moist. Posterior pharynx clear of any exudate or lesions.Normal dentition.  Neck: normal, supple, no masses, no thyromegaly Respiratory: Faint crackles at the bases bilaterally. Cardiovascular: Regular rate and rhythm, no murmurs / rubs / gallops. No extremity edema. 2+ pedal pulses. No carotid bruits.  Abdomen: no tenderness, no masses palpated. No hepatosplenomegaly. Bowel sounds positive.  Musculoskeletal: no clubbing / cyanosis. No joint deformity upper and lower extremities. Good ROM, no contractures. Normal muscle tone.  Skin: no rashes, lesions, ulcers. No induration Neurologic: CN 2-12 grossly intact. Sensation intact, DTR normal. Strength 5/5 in all 4.  Psychiatric: Normal judgment and insight. Alert and oriented x 3. Normal mood.    Labs on Admission: I have personally reviewed following labs and imaging studies  CBC: Recent Labs  Lab 10/23/22 0830  WBC 5.8  HGB 12.8*  HCT 39.9  MCV 108.4*  PLT 213   Basic Metabolic Panel: Recent Labs  Lab 10/23/22 0955  NA 142  K 3.1*  CL 107  CO2 26  GLUCOSE 98  BUN 15  CREATININE 1.41*  CALCIUM 8.4*   GFR: CrCl cannot be calculated (Unknown ideal weight.). Liver Function Tests: No results for input(s): "AST", "ALT", "ALKPHOS", "BILITOT", "PROT", "ALBUMIN" in the last  168 hours. No results for input(s): "LIPASE", "AMYLASE" in the last 168 hours. No results for input(s): "AMMONIA" in the last 168 hours. Coagulation Profile: No results for input(s): "INR", "PROTIME" in the last 168 hours. Cardiac Enzymes: No results for input(s): "CKTOTAL", "CKMB", "CKMBINDEX", "TROPONINI" in the last 168 hours. BNP (last 3 results) No results for input(s): "PROBNP" in the last 8760 hours. HbA1C: No results for input(s): "HGBA1C" in the last 72 hours. CBG: No results for input(s): "GLUCAP" in the last 168 hours. Lipid Profile: No results for input(s): "CHOL", "HDL", "LDLCALC", "TRIG", "CHOLHDL", "LDLDIRECT" in the last 72 hours. Thyroid Function Tests: No results for input(s): "TSH", "T4TOTAL", "FREET4", "T3FREE", "THYROIDAB" in the last 72 hours. Anemia Panel: No results for input(s): "VITAMINB12", "FOLATE", "FERRITIN", "TIBC", "IRON", "RETICCTPCT" in the last 72 hours. Urine analysis:    Component Value Date/Time   COLORURINE YELLOW 07/18/2022 1648   APPEARANCEUR CLEAR 07/18/2022 1648   LABSPEC 1.016 07/18/2022 1648   PHURINE 5.0 07/18/2022 1648   GLUCOSEU NEGATIVE 07/18/2022 1648   HGBUR SMALL (A) 07/18/2022 1648   BILIRUBINUR NEGATIVE 07/18/2022 1648   KETONESUR NEGATIVE 07/18/2022 1648   PROTEINUR NEGATIVE 07/18/2022 1648   UROBILINOGEN 0.2 04/27/2015 1033   NITRITE NEGATIVE 07/18/2022 1648   LEUKOCYTESUR NEGATIVE 07/18/2022 1648    Radiological Exams on Admission: CT Angio Chest PE W and/or Wo Contrast  Result Date: 10/23/2022 CLINICAL DATA:  Shortness of breath. EXAM: CT ANGIOGRAPHY CHEST WITH CONTRAST TECHNIQUE: Multidetector CT imaging of the chest was performed using the standard protocol during bolus administration of intravenous contrast. Multiplanar CT image reconstructions and MIPs were obtained to evaluate the vascular anatomy. RADIATION DOSE REDUCTION: This exam was performed according to the departmental dose-optimization program which  includes automated exposure control, adjustment of the mA and/or kV according to patient size and/or use of iterative reconstruction technique. CONTRAST:  OMNIPAQUE IOHEXOL 350 MG/ML SOLN COMPARISON:  Plain film of earlier today.  No prior chest CT. FINDINGS: Cardiovascular: The quality of this exam for evaluation of pulmonary embolism is moderate. Although the bolus is relatively well timed, there is moderate motion degradation and the patient's arms are not raised above the head. No pulmonary embolism to the large segmental level. Aortic atherosclerosis. Upper normal ascending aortic caliber at 3.9 cm. Mild cardiomegaly with moderate right heart dilatation. Lad  and left circumflex coronary artery calcification. Pulmonary artery enlargement, outflow tract 3.8 cm Mediastinum/Nodes: No mediastinal or hilar adenopathy. Lungs/Pleura: Bilateral pleural effusions. Mild centrilobular emphysema. Dependent subsegmental atelectasis in both lower lobes. Right upper lobe 7 mm pulmonary nodule on 30/10. Left upper lobe 3 mm nodule on 51/10. Calcified granuloma in the right middle lobe. Upper Abdomen: Cholecystectomy. Normal imaged portions of the liver, spleen, stomach, pancreas, adrenal glands. 3 mm upper pole right renal collecting system calculus. Musculoskeletal: Mid and lower thoracic spondylosis. Review of the MIP images confirms the above findings. IMPRESSION: 1. Moderate motion and patient position degradation. No pulmonary embolism with limitations above. 2. Small bilateral pleural effusions. 3. Pulmonary artery enlargement suggests pulmonary arterial hypertension. 4. Aortic atherosclerosis (ICD10-I70.0), coronary artery atherosclerosis and emphysema (ICD10-J43.9). 5. Pulmonary nodules of maximally 7 mm. If the patient is at high risk for bronchogenic carcinoma, follow-up chest CT at 3-6 months is recommended. If the patient is at low risk for bronchogenic carcinoma, follow-up chest CT at 6-12 months is  recommended. This recommendation follows the consensus statement: "Guidelines for Management of Small Pulmonary Nodules Detected on CT Scans: A Statement from the Fleischner Society" as published in Radiology 2005; 237:395-400. Online at: DietDisorder.cz. 6. Right nephrolithiasis Electronically Signed   By: Jeronimo Greaves M.D.   On: 10/23/2022 11:32   DG Chest Port 1 View  Result Date: 10/23/2022 CLINICAL DATA:  81 year old male with history of shortness of breath. Orthopnea. Left-sided chest pain. EXAM: PORTABLE CHEST 1 VIEW COMPARISON:  Chest x-ray 07/18/2022. FINDINGS: Skin fold artifact over the upper right hemithorax. Lung volumes are normal. Mild diffuse interstitial prominence and peribronchial cuffing, most evident in the lung bases. No consolidative airspace disease. No pleural effusions. No pneumothorax. No pulmonary nodule or mass noted. Pulmonary vasculature and the cardiomediastinal silhouette are within normal limits. Atherosclerotic calcifications in the thoracic aorta. IMPRESSION: 1. Diffuse peribronchial cuffing and interstitial prominence concerning for an acute bronchitis. 2. Aortic atherosclerosis. Electronically Signed   By: Trudie Reed M.D.   On: 10/23/2022 08:36    EKG: Independently reviewed.  Sinus rhythm with no acute ST-T wave changes.  Assessment/Plan Principal Problem:   CHF (congestive heart failure) (HCC) Active Problems:   CAD (coronary artery disease)   HTN (hypertension)   Hyperlipidemia   Hypokalemia   Dyspnea/possible congestive heart failure: Chest x-ray does not show any pulmonary edema but he does have crackles and has significantly elevated BNP suggestive of possible new CHF.  Last echo was done 3 years ago which was almost normal.  We will repeat echo.  I will give him 1 dose of IV Lasix 40 mg today and reassess tomorrow for additional doses.  Monitor strict I's and O's and daily weight.  I will also check COVID as  well as respiratory viral panel.  Bronchitis is also one of the differential diagnosis.  Hypokalemia: We will replace.  CAD s/p PTCA and stent in LAD: Currently asymptomatic.  Resume home medications.  Resume aspirin and Plavix.  History of bigeminy: Resume amiodarone.  Essential hypertension: Resume home lisinopril.  Blood pressure slightly elevated at the moment.  Acquired hypothyroidism: Resume Synthroid.  Hyperlipidemia: Resume Crestor.  BPH: Resume Flomax.  DVT prophylaxis: enoxaparin (LOVENOX) injection 40 mg Start: 10/23/22 1245 Code Status: Full code Family Communication: Wife present at bedside.  Plan of care discussed with patient in length and he verbalized understanding and agreed with it. Disposition Plan: Potential discharge in 1 to 2 days. Consults called: None  Hughie Closs MD [facility]  *Please note that this is a verbal dictation therefore any spelling or grammatical errors are due to the "[employer] One" system interpretation.  Please page via Amion and do not message via secure chat for urgent patient care matters. Secure chat can be used for non urgent patient care matters. 10/23/2022, 12:46 PM  To contact the attending provider between 7A-7P or the covering provider during after hours 7P-7A, please log into the web site [email or web address]

## 2022-10-23 NOTE — ED Notes (Signed)
ED TO INPATIENT HANDOFF REPORT  ED Nurse Name and Phone #: Linzy Darling RN  S Name/Age/Gender Thomas [name] 81 y.o. male Room/Bed: WA11/WA11  Code Status   Code Status: Full Code  Home/SNF/Other Home Patient oriented to: self, place, and time Is this baseline? No   Triage Complete: Triage complete  Chief Complaint CHF (congestive heart failure) (HCC) [I50.9]  Triage Note No notes on file   Allergies Allergies  Allergen Reactions   Hydrocodone Nausea And Vomiting and Other (See Comments)    Dizziness and upset stomach   Robaxin [Methocarbamol] Nausea And Vomiting   Roxicodone [Oxycodone] Nausea And Vomiting   Ultram [Tramadol] Nausea And Vomiting    Level of Care/Admitting Diagnosis ED Disposition     ED Disposition  Admit   Condition  --   Comment  Hospital Area: [facility]OSP[facility] [[id number]]  Level of Care: Telemetry [5]  Admit to tele based on following criteria: Acute CHF  May place patient in observation at [facility] or [facility] if equivalent level of care is available:: No  Covid Evaluation: Symptomatic Person Under Investigation (PUI) or recent exposure (last 10 days) *Testing Required*  Diagnosis: CHF (congestive heart failure) (HCC) [960454]  Admitting Physician: Hughie Closs [[id number]]  Attending Physician: Hughie Closs [[id number]]          B Medical/Surgery History Past Medical History:  Diagnosis Date   Arthritis    Bifascicular bundle branch block    Bigeminy 06/2019   Coronary artery disease    takes Plavix and ASA daily   GERD (gastroesophageal reflux disease)    occasional and will take Tums if needed   History of blood clots 2008   in chest   History of kidney stones late 90's   Hyperlipidemia    takes Simvastatin daily   Hypertension    takes Maxzide daily   Hypothyroidism    takes Synthroid daily   Joint pain    Joint swelling    left knee   Pneumonia    at 81 years old, double   Past Surgical History:   Procedure Laterality Date   ARTERY BIOPSY Left 12/15/2021   Procedure: LEFT TEMPORAL ARTERY BIOPSY;  Surgeon: Leonie Douglas, MD;  Location: [facility] OR;  Service: Vascular;  Laterality: Left;   CARDIAC CATHETERIZATION     CHOLECYSTECTOMY N/A 06/06/2018   Procedure: LAPAROSCOPIC CHOLECYSTECTOMY WITH INTRAOPERATIVE CHOLANGIOGRAM;  Surgeon: Manus Rudd, MD;  Location: [facility] OR;  Service: General;  Laterality: N/A;   CORONARY ANGIOPLASTY  05/21/2007   lad stent/taxus stent   CYSTOSCOPY WITH RETROGRADE PYELOGRAM, URETEROSCOPY AND STENT PLACEMENT Right 03/29/2022   Procedure: CYSTOSCOPY WITH RETROGRADE PYELOGRAM AND STENT PLACEMENT;  Surgeon: Crista Elliot, MD;  Location: [facility] ORS;  Service: Urology;  Laterality: Right;   CYSTOSCOPY/URETEROSCOPY/HOLMIUM LASER/STENT PLACEMENT Right 04/18/2022   Procedure: CYSTOSCOPY\ RIGHT URETEROSCOPY/STONE BASKET EXTRACTION/STENT PLACEMENT;  Surgeon: Crista Elliot, MD;  Location: [facility] ORS;  Service: Urology;  Laterality: Right;  60 MINS FOR CASE   HEMORROIDECTOMY  late 1970's   HERNIA REPAIR Right 1980's   TOTAL HIP ARTHROPLASTY Right 10/28/2013   Procedure: RIGHT TOTAL HIP ARTHROPLASTY ANTERIOR APPROACH;  Surgeon: Loanne Drilling, MD;  Location: [facility] ORS;  Service: Orthopedics;  Laterality: Right;   TOTAL HIP ARTHROPLASTY Left 05/08/2015   Procedure: LEFT TOTAL HIP ARTHROPLASTY ANTERIOR APPROACH;  Surgeon: Ollen Gross, MD;  Location: [facility] OR;  Service: Orthopedics;  Laterality: Left;     A IV Location/Drains/Wounds Patient Lines/Drains/Airways Status  Active Line/Drains/Airways     Name Placement date Placement time Site Days   Peripheral IV 10/23/22 20 G 1" Anterior;Left;Upper Arm 10/23/22  0955  Arm  less than 1   Urethral Catheter Genia Del, NT 16 Fr. 04/19/22  0000  --  187   Ureteral Drain/Stent Right ureter 6 Fr. 04/18/22  0920  Right ureter  188   Incision 10/28/13 Hip 10/28/13  1702  -- 3282   Incision (Closed) 05/08/15 Hip Left 05/08/15  1225   -- 2725   Incision (Closed) 03/29/22 Penis Other (Comment) 03/29/22  1644  -- 208   Incision - 4 Ports Abdomen 1: Umbilicus 2: Upper;Medial 3: Right;Medial 4: Right;Lateral 06/06/18  1038  -- 1600            Intake/Output Last 24 hours  Intake/Output Summary (Last 24 hours) at 10/23/2022 1915 Last data filed at 10/23/2022 1625 Gross per 24 hour  Intake 621 ml  Output 1575 ml  Net -954 ml    Labs/Imaging Results for orders placed or performed during the hospital encounter of 10/23/22 (from the past 48 hour(s))  D-dimer, quantitative     Status: Abnormal   Collection Time: 10/23/22  8:30 AM  Result Value Ref Range   D-Dimer, Quant 0.56 (H) 0.00 - 0.50 ug/mL-FEU    Comment: (NOTE) At the manufacturer cut-off value of 0.5 g/mL FEU, this assay has a negative predictive value of 95-100%.This assay is intended for use in conjunction with a clinical pretest probability (PTP) assessment model to exclude pulmonary embolism (PE) and deep venous thrombosis (DVT) in outpatients suspected of PE or DVT. Results should be correlated with clinical presentation. Performed at [facility], [address] [place], [place] [address]   Troponin I (High Sensitivity)     Status: Abnormal   Collection Time: 10/23/22  8:30 AM  Result Value Ref Range   Troponin I (High Sensitivity) 37 (H) <18 ng/L    Comment: Performed at [facility], [address], [place], [place] [address]  CBC     Status: Abnormal   Collection Time: 10/23/22  8:30 AM  Result Value Ref Range   WBC 5.8 4.0 - 10.5 K/uL   RBC 3.68 (L) 4.22 - 5.81 MIL/uL   Hemoglobin 12.8 (L) 13.0 - 17.0 g/dL   HCT 09.8 11.9 - 14.7 %   MCV 108.4 (H) 80.0 - 100.0 fL   MCH 34.8 (H) 26.0 - 34.0 pg   MCHC 32.1 30.0 - 36.0 g/dL   RDW 82.9 56.2 - 13.0 %   Platelets 213 150 - 400 K/uL   nRBC 0.0 0.0 - 0.2 %    Comment: Performed at [facility], [address], [place], [place] [address]   Brain natriuretic peptide     Status: Abnormal   Collection Time: 10/23/22  8:31 AM  Result Value Ref Range   B Natriuretic Peptide 1,287.6 (H) 0.0 - 100.0 pg/mL    Comment: Performed at [facility], [address] [place], [place] [address]  Troponin I (High Sensitivity)     Status: Abnormal   Collection Time: 10/23/22  9:55 AM  Result Value Ref Range   Troponin I (High Sensitivity) 47 (H) <18 ng/L    Comment: (NOTE) Elevated high sensitivity troponin I (hsTnI) values and significant  changes across serial measurements may suggest ACS but many other  chronic and acute conditions are known to elevate hsTnI results.  Refer to the "Links" section  for chest pain algorithms and additional  guidance. Performed at [facility], [address], [place], [place] [address]   Basic metabolic panel     Status: Abnormal   Collection Time: 10/23/22  9:55 AM  Result Value Ref Range   Sodium 142 135 - 145 mmol/L   Potassium 3.1 (L) 3.5 - 5.1 mmol/L   Chloride 107 98 - 111 mmol/L   CO2 26 22 - 32 mmol/L   Glucose, Bld 98 70 - 99 mg/dL    Comment: Glucose reference range applies only to samples taken after fasting for at least 8 hours.   BUN 15 8 - 23 mg/dL   Creatinine, Ser 8.65 (H) 0.61 - 1.24 mg/dL   Calcium 8.4 (L) 8.9 - 10.3 mg/dL   GFR, Estimated 50 (L) >60 mL/min    Comment: (NOTE) Calculated using the CKD-EPI Creatinine Equation (2021)    Anion gap 9 5 - 15    Comment: Performed at [facility], [address], [place], [place] [address]  SARS Coronavirus 2 by RT PCR (hospital order, performed in [facility] hospital lab) *cepheid single result test* Anterior Nasal Swab     Status: None   Collection Time: 10/23/22  1:21 PM   Specimen: Anterior Nasal Swab  Result Value Ref Range   SARS Coronavirus 2 by RT PCR NEGATIVE NEGATIVE    Comment: (NOTE) SARS-CoV-2 target nucleic acids are NOT DETECTED.  The SARS-CoV-2 RNA is  generally detectable in upper and lower respiratory specimens during the acute phase of infection. The lowest concentration of SARS-CoV-2 viral copies this assay can detect is 250 copies / mL. A negative result does not preclude SARS-CoV-2 infection and should not be used as the sole basis for treatment or other patient management decisions.  A negative result may occur with improper specimen collection / handling, submission of specimen other than nasopharyngeal swab, presence of viral mutation(s) within the areas targeted by this assay, and inadequate number of viral copies (<250 copies / mL). A negative result must be combined with clinical observations, patient history, and epidemiological information.  Fact Sheet for Patients:   RoadLapTop.co.za  Fact Sheet for Healthcare Providers: http://kim-miller.com/  This test is not yet approved or  cleared by the [place] FDA and has been authorized for detection and/or diagnosis of SARS-CoV-2 by FDA under an Emergency Use Authorization (EUA).  This EUA will remain in effect (meaning this test can be used) for the duration of the COVID-19 declaration under Section 564(b)(1) of the Act, 21 U.S.C. section 360bbb-3(b)(1), unless the authorization is terminated or revoked sooner.  Performed at [facility], [address] [place], [place] [address]   CBC     Status: Abnormal   Collection Time: 10/23/22  1:21 PM  Result Value Ref Range   WBC 5.6 4.0 - 10.5 K/uL   RBC 3.52 (L) 4.22 - 5.81 MIL/uL   Hemoglobin 12.2 (L) 13.0 - 17.0 g/dL   HCT 84.1 (L) 32.4 - 40.1 %   MCV 109.4 (H) 80.0 - 100.0 fL   MCH 34.7 (H) 26.0 - 34.0 pg   MCHC 31.7 30.0 - 36.0 g/dL   RDW 02.7 25.3 - 66.4 %   Platelets 204 150 - 400 K/uL   nRBC 0.0 0.0 - 0.2 %    Comment: Performed at [facility], [address] [place], [place] [address]  Creatinine, serum     Status: None    Collection Time:  10/23/22  1:21 PM  Result Value Ref Range   Creatinine, Ser 1.19 0.61 - 1.24 mg/dL   GFR, Estimated >16 >10 mL/min    Comment: (NOTE) Calculated using the CKD-EPI Creatinine Equation (2021) Performed at [facility], [address] [place], [place] [address]   Magnesium     Status: Abnormal   Collection Time: 10/23/22  1:21 PM  Result Value Ref Range   Magnesium 1.2 (L) 1.7 - 2.4 mg/dL    Comment: Performed at [facility], [address], [place], [place] [address]  TSH     Status: None   Collection Time: 10/23/22  1:21 PM  Result Value Ref Range   TSH 2.203 0.350 - 4.500 uIU/mL    Comment: Performed by a 3rd Generation assay with a functional sensitivity of <=0.01 uIU/mL. Performed at [facility], [address], [place], [place] [address]   Procalcitonin - Baseline     Status: None   Collection Time: 10/23/22  1:21 PM  Result Value Ref Range   Procalcitonin 19.80 ng/mL    Comment:        Interpretation: PCT >= 10 ng/mL: Important systemic inflammatory response, almost exclusively due to severe bacterial sepsis or septic shock. (NOTE)       Sepsis PCT Algorithm           Lower Respiratory Tract                                      Infection PCT Algorithm    ----------------------------     ----------------------------         PCT < 0.25 ng/mL                PCT < 0.10 ng/mL          Strongly encourage             Strongly discourage   discontinuation of antibiotics    initiation of antibiotics    ----------------------------     -----------------------------       PCT 0.25 - 0.50 ng/mL            PCT 0.10 - 0.25 ng/mL               OR       >80% decrease in PCT            Discourage initiation of                                            antibiotics      Encourage discontinuation           of antibiotics    ----------------------------     -----------------------------         PCT >= 0.50  ng/mL              PCT 0.26 - 0.50 ng/mL                AND       <80% decrease in PCT             Encourage initiation of  antibiotics       Encourage continuation           of antibiotics    ----------------------------     -----------------------------        PCT >= 0.50 ng/mL                  PCT > 0.50 ng/mL               AND         increase in PCT                  Strongly encourage                                      initiation of antibiotics    Strongly encourage escalation           of antibiotics                                     -----------------------------                                           PCT <= 0.25 ng/mL                                                 OR                                        > 80% decrease in PCT                                      Discontinue / Do not initiate                                             antibiotics  Performed at [facility], [address] [place], [place] [address]    ECHOCARDIOGRAM COMPLETE  Result Date: 10/23/2022    ECHOCARDIOGRAM REPORT   Patient Name:   Thomas [name] Date of Exam: 10/23/2022 Medical Rec #:  [id number]        Height:       72.0 in Accession #:    [id number]       Weight:       189.0 lb Date of Birth:  1941/09/08        BSA:          2.080 m Patient Age:    81 years         BP:           151/73 mmHg Patient Gender: M                HR:           60 bpm. Exam Location:  Inpatient Procedure: 2D  Echo, Cardiac Doppler and Color Doppler Indications:    CHF - acute diastolic  History:        Patient has prior history of Echocardiogram examinations, most                 recent 07/02/2019. CAD, Signs/Symptoms:Shortness of Breath; Risk                 Factors:Hypertension, Dyslipidemia and Family History of                 Coronary Artery Disease.  Sonographer:    Milda Smart Referring Phys: [id number] RAVI PAHWANI  Sonographer Comments: Image  acquisition challenging due to patient body habitus and Image acquisition challenging due to respiratory motion. IMPRESSIONS  1. Left ventricular ejection fraction, by estimation, is 40 to 45%. Left ventricular ejection fraction by 2D MOD biplane is 42.3 %. The left ventricle has mildly decreased function. The left ventricle demonstrates global hypokinesis. The left ventricular internal cavity size was mildly dilated. Left ventricular diastolic parameters are consistent with Grade II diastolic dysfunction (pseudonormalization). Elevated left ventricular end-diastolic pressure.  2. Right ventricular systolic function is normal. The right ventricular size is normal. There is severely elevated pulmonary artery systolic pressure. The estimated right ventricular systolic pressure is 67.6 mmHg.  3. Left atrial size was moderately dilated.  4. Right atrial size was moderately dilated.  5. The mitral valve is abnormal. Mild mitral valve regurgitation.  6. The aortic valve is tricuspid. Aortic valve regurgitation is trivial. Aortic valve sclerosis is present, with no evidence of aortic valve stenosis.  7. Aortic dilatation noted. There is borderline dilatation of the ascending aorta, measuring 39 mm.  8. The inferior vena cava is normal in size with <50% respiratory variability, suggesting right atrial pressure of 8 mmHg. Comparison(s): Changes from prior study are noted. 07/02/2019: LVEF 60-65%. FINDINGS  Left Ventricle: Left ventricular ejection fraction, by estimation, is 40 to 45%. Left ventricular ejection fraction by 2D MOD biplane is 42.3 %. The left ventricle has mildly decreased function. The left ventricle demonstrates global hypokinesis. The left ventricular internal cavity size was mildly dilated. There is no left ventricular hypertrophy. Left ventricular diastolic parameters are consistent with Grade II diastolic dysfunction (pseudonormalization). Elevated left ventricular end-diastolic pressure. Right Ventricle:  The right ventricular size is normal. No increase in right ventricular wall thickness. Right ventricular systolic function is normal. There is severely elevated pulmonary artery systolic pressure. The tricuspid regurgitant velocity is 3.86 m/s, and with an assumed right atrial pressure of 8 mmHg, the estimated right ventricular systolic pressure is 67.6 mmHg. Left Atrium: Left atrial size was moderately dilated. Right Atrium: Right atrial size was moderately dilated. Pericardium: Trivial pericardial effusion is present. The pericardial effusion is posterior to the left ventricle. Mitral Valve: The mitral valve is abnormal. There is mild thickening of the anterior and posterior mitral valve leaflet(s). Mild mitral valve regurgitation, with posteriorly-directed jet. Tricuspid Valve: The tricuspid valve is grossly normal. Tricuspid valve regurgitation is mild. Aortic Valve: The aortic valve is tricuspid. Aortic valve regurgitation is trivial. Aortic valve sclerosis is present, with no evidence of aortic valve stenosis. Pulmonic Valve: The pulmonic valve was grossly normal. Pulmonic valve regurgitation is trivial. Aorta: Aortic dilatation noted. There is borderline dilatation of the ascending aorta, measuring 39 mm. Venous: The inferior vena cava is normal in size with less than 50% respiratory variability, suggesting right atrial pressure of 8 mmHg. IAS/Shunts: No atrial level shunt detected by color flow Doppler.  LEFT VENTRICLE PLAX 2D                        Biplane EF (MOD) LVIDd:         5.80 cm         LV Biplane EF:   Left LVIDs:         4.30 cm                          ventricular LV PW:         0.90 cm                          ejection LV IVS:        0.90 cm                          fraction by LVOT diam:     2.00 cm                          2D MOD LV SV:         49                               biplane is LV SV Index:   24                               42.3 %. LVOT Area:     3.14 cm                                 Diastology                                LV e' medial:    3.37 cm/s LV Volumes (MOD)               LV E/e' medial:  20.2 LV vol d, MOD    139.0 ml      LV e' lateral:   9.89 cm/s A2C:                           LV E/e' lateral: 6.9 LV vol d, MOD    131.0 ml A4C: LV vol s, MOD    78.2 ml A2C: LV vol s, MOD    78.1 ml A4C: LV SV MOD A2C:   60.8 ml LV SV MOD A4C:   131.0 ml LV SV MOD BP:    57.5 ml RIGHT VENTRICLE RV S prime:     14.40 cm/s TAPSE (M-mode): 2.4 cm LEFT ATRIUM              Index        RIGHT ATRIUM           Index LA diam:        5.20 cm  2.50 cm/m   RA Area:     26.70 cm LA Vol (A2C):   108.0 ml 51.92 ml/m  RA Volume:   97.90 ml  47.07 ml/m LA Vol (A4C):   81.9 ml  39.38 ml/m LA Biplane Vol:  94.2 ml  45.29 ml/m  AORTIC VALVE LVOT Vmax:   88.30 cm/s LVOT Vmean:  55.100 cm/s LVOT VTI:    0.157 m  AORTA Ao Root diam: 3.30 cm Ao Asc diam:  3.90 cm MITRAL VALVE               TRICUSPID VALVE MV Area (PHT): 3.46 cm    TR Peak grad:   59.6 mmHg MV Decel Time: 219 msec    TR Mean grad:   33.0 mmHg MR Peak grad: 87.8 mmHg    TR Vmax:        386.00 cm/s MR Vmax:      468.50 cm/s  TR Vmean:       268.0 cm/s MV E velocity: 68.20 cm/s MV A velocity: 38.40 cm/s  SHUNTS MV E/A ratio:  1.78        Systemic VTI:  0.16 m                            Systemic Diam: 2.00 cm Zoila Shutter MD Electronically signed by Zoila Shutter MD Signature Date/Time: 10/23/2022/4:05:44 PM    Final    CT Angio Chest PE W and/or Wo Contrast  Result Date: 10/23/2022 CLINICAL DATA:  Shortness of breath. EXAM: CT ANGIOGRAPHY CHEST WITH CONTRAST TECHNIQUE: Multidetector CT imaging of the chest was performed using the standard protocol during bolus administration of intravenous contrast. Multiplanar CT image reconstructions and MIPs were obtained to evaluate the vascular anatomy. RADIATION DOSE REDUCTION: This exam was performed according to the departmental dose-optimization program which includes automated exposure control,  adjustment of the mA and/or kV according to patient size and/or use of iterative reconstruction technique. CONTRAST:  OMNIPAQUE IOHEXOL 350 MG/ML SOLN COMPARISON:  Plain film of earlier today.  No prior chest CT. FINDINGS: Cardiovascular: The quality of this exam for evaluation of pulmonary embolism is moderate. Although the bolus is relatively well timed, there is moderate motion degradation and the patient's arms are not raised above the head. No pulmonary embolism to the large segmental level. Aortic atherosclerosis. Upper normal ascending aortic caliber at 3.9 cm. Mild cardiomegaly with moderate right heart dilatation. Lad and left circumflex coronary artery calcification. Pulmonary artery enlargement, outflow tract 3.8 cm Mediastinum/Nodes: No mediastinal or hilar adenopathy. Lungs/Pleura: Bilateral pleural effusions. Mild centrilobular emphysema. Dependent subsegmental atelectasis in both lower lobes. Right upper lobe 7 mm pulmonary nodule on 30/10. Left upper lobe 3 mm nodule on 51/10. Calcified granuloma in the right middle lobe. Upper Abdomen: Cholecystectomy. Normal imaged portions of the liver, spleen, stomach, pancreas, adrenal glands. 3 mm upper pole right renal collecting system calculus. Musculoskeletal: Mid and lower thoracic spondylosis. Review of the MIP images confirms the above findings. IMPRESSION: 1. Moderate motion and patient position degradation. No pulmonary embolism with limitations above. 2. Small bilateral pleural effusions. 3. Pulmonary artery enlargement suggests pulmonary arterial hypertension. 4. Aortic atherosclerosis (ICD10-I70.0), coronary artery atherosclerosis and emphysema (ICD10-J43.9). 5. Pulmonary nodules of maximally 7 mm. If the patient is at high risk for bronchogenic carcinoma, follow-up chest CT at 3-6 months is recommended. If the patient is at low risk for bronchogenic carcinoma, follow-up chest CT at 6-12 months is recommended. This recommendation follows the  consensus statement: "Guidelines for Management of Small Pulmonary Nodules Detected on CT Scans: A Statement from the Fleischner Society" as published in Radiology 2005; 237:395-400. Online at: DietDisorder.cz. 6. Right nephrolithiasis Electronically Signed   By: Hosie Spangle.D.  On: 10/23/2022 11:32   DG Chest Port 1 View  Result Date: 10/23/2022 CLINICAL DATA:  81 year old male with history of shortness of breath. Orthopnea. Left-sided chest pain. EXAM: PORTABLE CHEST 1 VIEW COMPARISON:  Chest x-ray 07/18/2022. FINDINGS: Skin fold artifact over the upper right hemithorax. Lung volumes are normal. Mild diffuse interstitial prominence and peribronchial cuffing, most evident in the lung bases. No consolidative airspace disease. No pleural effusions. No pneumothorax. No pulmonary nodule or mass noted. Pulmonary vasculature and the cardiomediastinal silhouette are within normal limits. Atherosclerotic calcifications in the thoracic aorta. IMPRESSION: 1. Diffuse peribronchial cuffing and interstitial prominence concerning for an acute bronchitis. 2. Aortic atherosclerosis. Electronically Signed   By: Trudie Reedaniel  Entrikin M.D.   On: 10/23/2022 08:36    Pending Labs Unresulted Labs (From admission, onward)     Start     Ordered   10/30/22 0500  Creatinine, serum  (enoxaparin (LOVENOX)    CrCl >/= 30 ml/min)  Weekly,   R     Comments: while on enoxaparin therapy    10/23/22 1243   10/24/22 0500  Comprehensive metabolic panel  Tomorrow morning,   R        10/23/22 1243   10/24/22 0500  CBC  Tomorrow morning,   R        10/23/22 1243   10/24/22 0500  Brain natriuretic peptide  Tomorrow morning,   R        10/23/22 1250   10/23/22 1248  Respiratory (~20 pathogens) panel by PCR  (Respiratory panel by PCR (~20 pathogens, ~24 hr TAT)  w precautions)  Once,   R        10/23/22 1247            Vitals/Pain Today's Vitals   10/23/22 0945 10/23/22 1148 10/23/22  1500 10/23/22 1624  BP: (!) 158/81 (!) 177/82 (!) 151/73   Pulse: (!) 56 65 (!) 57   Resp: (!) 25 (!) 30 (!) 22   Temp:  97.9 F (36.6 C)  97.9 F (36.6 C)  TempSrc:  Oral  Oral  SpO2: 97% 98% 97%   PainSc:        Isolation Precautions Droplet precaution  Medications Medications  aspirin EC tablet 81 mg (81 mg Oral Given 10/23/22 1316)  lisinopril (ZESTRIL) tablet 10 mg (10 mg Oral Given 10/23/22 1315)  rosuvastatin (CRESTOR) tablet 10 mg (10 mg Oral Given 10/23/22 1315)  levothyroxine (SYNTHROID) tablet 150 mcg (150 mcg Oral Given 10/23/22 1315)  clopidogrel (PLAVIX) tablet 75 mg (has no administration in time range)  furosemide (LASIX) injection 40 mg (has no administration in time range)  acetaminophen (TYLENOL) tablet 650 mg (has no administration in time range)    Or  acetaminophen (TYLENOL) suppository 650 mg (has no administration in time range)  ondansetron (ZOFRAN) tablet 4 mg (has no administration in time range)    Or  ondansetron (ZOFRAN) injection 4 mg (has no administration in time range)  enoxaparin (LOVENOX) injection 40 mg (40 mg Subcutaneous Given 10/23/22 1852)  amiodarone (PACERONE) tablet 200 mg (has no administration in time range)  iohexol (OMNIPAQUE) 350 MG/ML injection 100 mL (100 mLs Intravenous Contrast Given 10/23/22 1102)  potassium chloride SA (KLOR-CON M) CR tablet 40 mEq (40 mEq Oral Given 10/23/22 1623)  furosemide (LASIX) injection 40 mg (40 mg Intravenous Given 10/23/22 1316)    Mobility walks with device     Focused Assessments    R Recommendations: See Admitting Provider Note  Report given to:  Additional Notes:

## 2022-10-24 DIAGNOSIS — E876 Hypokalemia: Secondary | ICD-10-CM | POA: Diagnosis not present

## 2022-10-24 DIAGNOSIS — I5041 Acute combined systolic (congestive) and diastolic (congestive) heart failure: Secondary | ICD-10-CM

## 2022-10-24 DIAGNOSIS — I25118 Atherosclerotic heart disease of native coronary artery with other forms of angina pectoris: Secondary | ICD-10-CM | POA: Diagnosis not present

## 2022-10-24 DIAGNOSIS — I509 Heart failure, unspecified: Secondary | ICD-10-CM | POA: Diagnosis not present

## 2022-10-24 DIAGNOSIS — I10 Essential (primary) hypertension: Secondary | ICD-10-CM | POA: Diagnosis not present

## 2022-10-24 LAB — CBC
HCT: 38.6 % — ABNORMAL LOW (ref 39.0–52.0)
Hemoglobin: 12.5 g/dL — ABNORMAL LOW (ref 13.0–17.0)
MCH: 34.9 pg — ABNORMAL HIGH (ref 26.0–34.0)
MCHC: 32.4 g/dL (ref 30.0–36.0)
MCV: 107.8 fL — ABNORMAL HIGH (ref 80.0–100.0)
Platelets: 205 10*3/uL (ref 150–400)
RBC: 3.58 MIL/uL — ABNORMAL LOW (ref 4.22–5.81)
RDW: 15 % (ref 11.5–15.5)
WBC: 5.6 10*3/uL (ref 4.0–10.5)
nRBC: 0 % (ref 0.0–0.2)

## 2022-10-24 LAB — MAGNESIUM: Magnesium: 2.4 mg/dL (ref 1.7–2.4)

## 2022-10-24 LAB — COMPREHENSIVE METABOLIC PANEL
ALT: 14 U/L (ref 0–44)
AST: 15 U/L (ref 15–41)
Albumin: 3.6 g/dL (ref 3.5–5.0)
Alkaline Phosphatase: 55 U/L (ref 38–126)
Anion gap: 10 (ref 5–15)
BUN: 20 mg/dL (ref 8–23)
CO2: 25 mmol/L (ref 22–32)
Calcium: 8.6 mg/dL — ABNORMAL LOW (ref 8.9–10.3)
Chloride: 105 mmol/L (ref 98–111)
Creatinine, Ser: 1.68 mg/dL — ABNORMAL HIGH (ref 0.61–1.24)
GFR, Estimated: 41 mL/min — ABNORMAL LOW (ref >60)
Glucose, Bld: 97 mg/dL (ref 70–99)
Potassium: 3.3 mmol/L — ABNORMAL LOW (ref 3.5–5.1)
Sodium: 140 mmol/L (ref 135–145)
Total Bilirubin: 1.6 mg/dL — ABNORMAL HIGH (ref 0.3–1.2)
Total Protein: 6.5 g/dL (ref 6.5–8.1)

## 2022-10-24 LAB — RESPIRATORY PANEL BY PCR

## 2022-10-24 LAB — GLUCOSE, CAPILLARY: Glucose-Capillary: 87 mg/dL (ref 70–99)

## 2022-10-24 LAB — BRAIN NATRIURETIC PEPTIDE: B Natriuretic Peptide: 1430.4 pg/mL — ABNORMAL HIGH (ref 0.0–100.0)

## 2022-10-24 MED ORDER — ASPIRIN 81 MG PO CHEW
81.0000 mg | CHEWABLE_TABLET | ORAL | Status: AC
  Administered 2022-10-25: 81 mg via ORAL
  Filled 2022-10-24: qty 1

## 2022-10-24 MED ORDER — MAGNESIUM SULFATE 4 GM/100ML IV SOLN
4.0000 g | Freq: Once | INTRAVENOUS | Status: AC
  Administered 2022-10-24: 4 g via INTRAVENOUS
  Filled 2022-10-24: qty 100

## 2022-10-24 MED ORDER — SODIUM CHLORIDE 0.9 % IV SOLN: 250.0000 mL | INTRAVENOUS | Status: DC | PRN

## 2022-10-24 MED ORDER — SODIUM CHLORIDE 0.9% FLUSH: 3.0000 mL | INTRAVENOUS | Status: DC | PRN

## 2022-10-24 MED ORDER — SODIUM CHLORIDE 0.9% FLUSH
3.0000 mL | Freq: Two times a day (BID) | INTRAVENOUS | Status: DC
  Administered 2022-10-24: 3 mL via INTRAVENOUS

## 2022-10-24 MED ORDER — SODIUM CHLORIDE 0.9 % IV SOLN: INTRAVENOUS | Status: DC

## 2022-10-24 MED ORDER — POTASSIUM CHLORIDE CRYS ER 20 MEQ PO TBCR
40.0000 meq | EXTENDED_RELEASE_TABLET | ORAL | Status: AC
  Administered 2022-10-24 (×2): 40 meq via ORAL
  Filled 2022-10-24 (×2): qty 2

## 2022-10-24 MED ORDER — LOSARTAN POTASSIUM 25 MG PO TABS
25.0000 mg | ORAL_TABLET | Freq: Every day | ORAL | Status: DC
  Administered 2022-10-24: 25 mg via ORAL
  Filled 2022-10-24: qty 1

## 2022-10-24 NOTE — TOC Initial Note (Signed)
Transition of Care [facility]) - Initial/Assessment Note    Patient Details  Name: Thomas [name] MRN: [id number] Date of Birth: 27-Jul-1941  [facility] CM/SW Contact:    Golda Acre, RN Phone Number: 10/24/2022, 10:51 AM  Clinical Narrative:                 Request for home heart failure screening sent to [facility] for reivew.  Expected Discharge Plan: Home w Home Health Services Barriers to Discharge: Continued Medical Work up   Patient Goals and CMS Choice Patient states their goals for this hospitalization and ongoing recovery are:: to return to my home CMS Medicare.gov Compare Post Acute Care list provided to:: Patient    Expected Discharge Plan and Services Expected Discharge Plan: Home w Home Health Services   Discharge Planning Services: CM Consult Post Acute Care Choice: Home Health Living arrangements for the past 2 months: Single Family Home                           HH Arranged: RN, Disease Management HH Agency: [facility] Date HH Agency Contacted: 10/24/22 Time HH Agency Contacted: 1051 Representative spoke with at [facility] Agency: Morrie Sheldon  Prior Living Arrangements/Services Living arrangements for the past 2 months: Single Family Home Lives with:: Spouse Patient language and need for interpreter reviewed:: Yes Do you feel safe going back to the place where you live?: Yes               Activities of Daily Living Home Assistive Devices/Equipment: Cane (specify quad or straight) ADL Screening (condition at time of admission) Patient's cognitive ability adequate to safely complete daily activities?: Yes Is the patient deaf or have difficulty hearing?: No Does the patient have difficulty seeing, even when wearing glasses/contacts?: No Does the patient have difficulty concentrating, remembering, or making decisions?: No Patient able to express need for assistance with ADLs?: Yes Does the patient have difficulty dressing  or bathing?: No Independently performs ADLs?: Yes (appropriate for developmental age) Does the patient have difficulty walking or climbing stairs?: Yes Weakness of Legs: None Weakness of Arms/Hands: None  Permission Sought/Granted                  Emotional Assessment Appearance:: Appears stated age Attitude/Demeanor/Rapport: Gracious, Engaged Affect (typically observed): Calm Orientation: : Oriented to Self, Oriented to Place, Oriented to  Time, Oriented to Situation Alcohol / Substance Use: Never Used Psych Involvement: No (comment)  Admission diagnosis:  Orthopnea [R06.01] Hypokalemia [E87.6] CHF (congestive heart failure) (HCC) [I50.9] PND (paroxysmal nocturnal dyspnea) [R06.00] Elevated brain natriuretic peptide (BNP) level [R79.89] Patient Active Problem List   Diagnosis Date Noted   Acute combined systolic and diastolic congestive heart failure (HCC) 10/24/2022   CHF (congestive heart failure) (HCC) 10/23/2022   UTI (urinary tract infection) 03/29/2022   PVC's (premature ventricular contractions) 10/11/2019   Hypomagnesemia 07/02/2019   Nonspecific abnormal electrocardiogram (ECG) (EKG) 07/02/2019   Near syncope 07/01/2019   Preoperative cardiovascular examination    Abdominal pain 06/04/2018   Acute pancreatitis 06/04/2018   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 [address] [place] [address] [place] [address] Phone: [phone] Fax: [phone]     Social Determinants of Health (SDOH) Interventions    Readmission Risk Interventions   No  data to display

## 2022-10-24 NOTE — Consult Note (Signed)
Cardiology Consult:   Patient ID: Thomas [name]; MRN: [id number]; DOB: 1940/12/15   Admission date: 10/23/2022  Primary Care Provider: Kaleen Mask, MD Primary Cardiologist: Dr. Elease Hashimoto Primary Electrophysiologist:  Dr. Ladona Ridgel  Chief Complaint:  New HF  Patient Profile:   Thomas [name] is a 81 y.o. 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 Ladona Ridgel who is presenting for worsening EF and CHF sx  History of Present Illness:   Thomas [name] is feeling a return of his CP.   During prior PCI, he had LHC for anginal pain that radiated to his left arm.  He has seen cardiology once a year with non of these symptoms. He is, however having new chest pain.  This occurs in the center of his chest.  Occurred last Saturday night.  He tried to reposition himself and could not get the pain to abate.  He had elevated BP at that time and was told he had HTN urgency. Had felt well prior to Saturday.  Found to have slight bradycardia. Lok K and clear lungs.  No significant hsc troponin elevation.  COVID-19 negative. Felt better with IV lasix.  His chest pain has improved but has occurred today.  No shortness of breath, DOE .  No PND or orthopnea.  No weight gain, leg swelling , or abdominal swelling.  No syncope or near syncope . Notes  no palpitations or funny heart beats.      Past Medical History:  Diagnosis Date   Arthritis    Bifascicular bundle branch block    Bigeminy 06/2019   Coronary artery disease    takes Plavix and ASA daily   GERD (gastroesophageal reflux disease)    occasional and will take Tums if needed   History of blood clots 2008   in chest   History of kidney stones late 90's   Hyperlipidemia    takes Simvastatin daily   Hypertension    takes Maxzide daily   Hypothyroidism    takes Synthroid daily   Joint pain    Joint swelling    left knee    Pneumonia    at 81 years old, double    Past Surgical History:  Procedure Laterality Date   ARTERY BIOPSY Left 12/15/2021   Procedure: LEFT TEMPORAL ARTERY BIOPSY;  Surgeon: Leonie Douglas, MD;  Location: [facility] OR;  Service: Vascular;  Laterality: Left;   CARDIAC CATHETERIZATION     CHOLECYSTECTOMY N/A 06/06/2018   Procedure: LAPAROSCOPIC CHOLECYSTECTOMY WITH INTRAOPERATIVE CHOLANGIOGRAM;  Surgeon: Manus Rudd, MD;  Location: [facility] OR;  Service: General;  Laterality: N/A;   CORONARY ANGIOPLASTY  05/21/2007   lad stent/taxus stent   CYSTOSCOPY WITH RETROGRADE PYELOGRAM, URETEROSCOPY AND STENT PLACEMENT Right 03/29/2022   Procedure: CYSTOSCOPY WITH RETROGRADE PYELOGRAM AND STENT PLACEMENT;  Surgeon: Crista Elliot, MD;  Location: [facility] ORS;  Service: Urology;  Laterality: Right;   CYSTOSCOPY/URETEROSCOPY/HOLMIUM LASER/STENT PLACEMENT Right 04/18/2022   Procedure: CYSTOSCOPY\ RIGHT URETEROSCOPY/STONE BASKET EXTRACTION/STENT PLACEMENT;  Surgeon: Crista Elliot, MD;  Location: [facility] ORS;  Service: Urology;  Laterality: Right;  60 MINS FOR CASE   HEMORROIDECTOMY  late 1970's   HERNIA REPAIR Right 1980's   TOTAL HIP ARTHROPLASTY Right 10/28/2013   Procedure: RIGHT TOTAL HIP ARTHROPLASTY ANTERIOR APPROACH;  Surgeon: Loanne Drilling, MD;  Location: [facility] ORS;  Service: Orthopedics;  Laterality:  Right;   TOTAL HIP ARTHROPLASTY Left 05/08/2015   Procedure: LEFT TOTAL HIP ARTHROPLASTY ANTERIOR APPROACH;  Surgeon: Ollen GrossFrank Aluisio, MD;  Location: [facility] OR;  Service: Orthopedics;  Laterality: Left;     Medications Prior to Admission: Prior to Admission medications   Medication Sig Start Date End Date Taking? Authorizing Provider  acetaminophen (TYLENOL) 500 MG tablet Take 500 mg by mouth daily as needed for moderate pain.   Yes [provider]  amiodarone (PACERONE) 200 MG tablet TAKE 1 TABLET BY MOUTH DAILY EXCEPT SUNDAY. DO NOT TAKE ON SUNDAY Patient taking differently: Take 200 mg by mouth See admin  instructions. Take 200 mg by mouth daily six days a week. Do not take on Sundays. 10/10/22  Yes Marinus Mawaylor, Thomas W, MD  aspirin EC 81 MG tablet Take 1 tablet (81 mg total) by mouth daily. 04/02/18  Yes Nahser, Deloris PingPhilip J, MD  azelastine (ASTELIN) 0.1 % nasal spray Place 1 spray into both nostrils daily as needed for rhinitis. Use in each nostril as directed   Yes [provider]  clopidogrel (PLAVIX) 75 MG tablet Take 1 tablet (75 mg total) by mouth daily with breakfast. 04/04/22  Yes Nahser, Deloris PingPhilip J, MD  levothyroxine (SYNTHROID, LEVOTHROID) 150 MCG tablet Take 150 mcg by mouth daily. 01/04/18  Yes [provider]  lisinopril (ZESTRIL) 10 MG tablet Take 10 mg by mouth daily. 05/21/20  Yes [provider]  rosuvastatin (CRESTOR) 10 MG tablet Take 10 mg by mouth daily.   Yes [provider]  HYDROcodone-acetaminophen (NORCO) 5-325 MG tablet Take 1 tablet by mouth every 6 (six) hours as needed for moderate pain. Patient not taking: Reported on 04/04/2022 03/25/22   Edwin DadaGray, Alicia P, DO  ondansetron (ZOFRAN) 4 MG tablet Take 1 tablet (4 mg total) by mouth every 6 (six) hours. Patient not taking: Reported on 04/12/2022 03/25/22   Edwin DadaGray, Alicia P, DO  sulfamethoxazole-trimethoprim (BACTRIM DS) 800-160 MG tablet Take 1 tablet by mouth 2 (two) times daily. Patient not taking: Reported on 10/23/2022 04/18/22   Ray ChurchBell, Eugene D III, MD  tamsulosin (FLOMAX) 0.4 MG CAPS capsule Take 1 capsule (0.4 mg total) by mouth daily. Patient not taking: Reported on 04/12/2022 03/25/22   Edwin DadaGray, Alicia P, DO  vitamin B-12 (CYANOCOBALAMIN) 500 MCG tablet Take 500 mcg by mouth daily. Patient not taking: Reported on 10/23/2022    [provider]     Allergies:    Allergies  Allergen Reactions   Hydrocodone Nausea And Vomiting and Other (See Comments)    Dizziness and upset stomach   Robaxin [Methocarbamol] Nausea And Vomiting   Roxicodone [Oxycodone] Nausea And Vomiting   Ultram [Tramadol] Nausea  And Vomiting    Social History:   Social History   Socioeconomic History   Marital status: Married    Spouse name: Not on file   Number of children: Not on file   Years of education: Not on file   Highest education level: Not on file  Occupational History   Not on file  Tobacco Use   Smoking status: Former    Packs/day: 0.25    Years: 52.00    Total pack years: 13.00    Types: Cigarettes    Quit date: 2016    Years since quitting: 7.8   Smokeless tobacco: Never   Tobacco comments:    trying to quit  Vaping Use   Vaping Use: Never used  Substance and Sexual Activity   Alcohol use: Not Currently   Drug  use: No   Sexual activity: Not on file  Other Topics Concern   Not on file  Social History Narrative   Not on file   Social Determinants of Health   Financial Resource Strain: Not on file  Food Insecurity: No Food Insecurity (10/23/2022)   Hunger Vital Sign    Worried About Running Out of Food in the Last Year: Never true    Ran Out of Food in the Last Year: Never true  Transportation Needs: No Transportation Needs (10/23/2022)   PRAPARE - Administrator, Civil Service (Medical): No    Lack of Transportation (Non-Medical): No  Physical Activity: Not on file  Stress: Not on file  Social Connections: Not on file  Intimate Partner Violence: Not At Risk (10/23/2022)   Humiliation, Afraid, Rape, and Kick questionnaire    Fear of Current or Ex-Partner: No    Emotionally Abused: No    Physically Abused: No    Sexually Abused: No    Family History:   The patient's family history includes CAD in his mother; Heart failure in his father and mother; Pneumonia in his father.    ROS:  Please see the history of present illness.   Physical Exam/Data:   Vitals:   10/24/22 0440 10/24/22 0500 10/24/22 0817 10/24/22 1243  BP: (!) 148/73  (!) 144/72 122/69  Pulse: (!) 51  61 (!) 56  Resp: 18  20 (!) 22  Temp: 98.2 F (36.8 C)  98.3 F (36.8 C) 97.6 F (36.4  C)  TempSrc: Oral  Oral Oral  SpO2: 97%  95% 98%  Weight:  79.4 kg    Height:        Intake/Output Summary (Last 24 hours) at 10/24/2022 1422 Last data filed at 10/24/2022 1100 Gross per 24 hour  Intake 1080 ml  Output 2125 ml  Net -1045 ml   Filed Weights   10/23/22 2028 10/24/22 0500  Weight: 81.6 kg 79.4 kg   Body mass index is 23.74 kg/m.   Gen: no distress,  Cardiac: No Rubs or Gallops, no murmur, regular bradycardia, +2 radial pulses Respiratory: Clear to auscultation bilaterally, normal effort, normal  respiratory rate GI: Soft, nontender, non-distended  MS: No  edema;  moves all extremities Integument: Skin feels warm Neuro:  At time of evaluation, alert and oriented to person/place/time/situation  Psych: Normal affect, patient feels ok   EKG:  The ECG that was done  was personally reviewed and demonstrates sinus rhythm with 1st HB, RBBB, and LAFB, similar BIFB since 07/19/22  Relevant CV Studies: Cardiac Studies & Procedures     STRESS TESTS  MYOCARDIAL PERFUSION IMAGING 09/20/2019  Narrative  There was no ST segment deviation noted during stress.  No T wave inversion was noted during stress.  The study is normal.  This is a low risk study.  Low risk stress nuclear study with normal perfusion. The study could not be gated due to frequent PVCs.   ECHOCARDIOGRAM  ECHOCARDIOGRAM COMPLETE 10/23/2022  Narrative ECHOCARDIOGRAM REPORT    Patient Name:   NOVA EVETT Date of Exam: 10/23/2022 Medical Rec #:  [id number]        Height:       72.0 in Accession #:    [id number]       Weight:       189.0 lb Date of Birth:  03-09-1941        BSA:          2.080 m Patient  Age:    81 years         BP:           151/73 mmHg Patient Gender: M                HR:           60 bpm. Exam Location:  Inpatient  Procedure: 2D Echo, Cardiac Doppler and Color Doppler  Indications:    CHF - acute diastolic  History:        Patient has prior history of  Echocardiogram examinations, most recent 07/02/2019. CAD, Signs/Symptoms:Shortness of Breath; Risk Factors:Hypertension, Dyslipidemia and Family History of Coronary Artery Disease.  Sonographer:    Milda Smart Referring Phys: [id number] RAVI PAHWANI   Sonographer Comments: Image acquisition challenging due to patient body habitus and Image acquisition challenging due to respiratory motion. IMPRESSIONS   1. Left ventricular ejection fraction, by estimation, is 40 to 45%. Left ventricular ejection fraction by 2D MOD biplane is 42.3 %. The left ventricle has mildly decreased function. The left ventricle demonstrates global hypokinesis. The left ventricular internal cavity size was mildly dilated. Left ventricular diastolic parameters are consistent with Grade II diastolic dysfunction (pseudonormalization). Elevated left ventricular end-diastolic pressure. 2. Right ventricular systolic function is normal. The right ventricular size is normal. There is severely elevated pulmonary artery systolic pressure. The estimated right ventricular systolic pressure is 67.6 mmHg. 3. Left atrial size was moderately dilated. 4. Right atrial size was moderately dilated. 5. The mitral valve is abnormal. Mild mitral valve regurgitation. 6. The aortic valve is tricuspid. Aortic valve regurgitation is trivial. Aortic valve sclerosis is present, with no evidence of aortic valve stenosis. 7. Aortic dilatation noted. There is borderline dilatation of the ascending aorta, measuring 39 mm. 8. The inferior vena cava is normal in size with <50% respiratory variability, suggesting right atrial pressure of 8 mmHg.  Comparison(s): Changes from prior study are noted. 07/02/2019: LVEF 60-65%.  FINDINGS Left Ventricle: Left ventricular ejection fraction, by estimation, is 40 to 45%. Left ventricular ejection fraction by 2D MOD biplane is 42.3 %. The left ventricle has mildly decreased function. The left ventricle demonstrates  global hypokinesis. The left ventricular internal cavity size was mildly dilated. There is no left ventricular hypertrophy. Left ventricular diastolic parameters are consistent with Grade II diastolic dysfunction (pseudonormalization). Elevated left ventricular end-diastolic pressure.  Right Ventricle: The right ventricular size is normal. No increase in right ventricular wall thickness. Right ventricular systolic function is normal. There is severely elevated pulmonary artery systolic pressure. The tricuspid regurgitant velocity is 3.86 m/s, and with an assumed right atrial pressure of 8 mmHg, the estimated right ventricular systolic pressure is 67.6 mmHg.  Left Atrium: Left atrial size was moderately dilated.  Right Atrium: Right atrial size was moderately dilated.  Pericardium: Trivial pericardial effusion is present. The pericardial effusion is posterior to the left ventricle.  Mitral Valve: The mitral valve is abnormal. There is mild thickening of the anterior and posterior mitral valve leaflet(s). Mild mitral valve regurgitation, with posteriorly-directed jet.  Tricuspid Valve: The tricuspid valve is grossly normal. Tricuspid valve regurgitation is mild.  Aortic Valve: The aortic valve is tricuspid. Aortic valve regurgitation is trivial. Aortic valve sclerosis is present, with no evidence of aortic valve stenosis.  Pulmonic Valve: The pulmonic valve was grossly normal. Pulmonic valve regurgitation is trivial.  Aorta: Aortic dilatation noted. There is borderline dilatation of the ascending aorta, measuring 39 mm.  Venous: The inferior vena cava is normal in size  with less than 50% respiratory variability, suggesting right atrial pressure of 8 mmHg.  IAS/Shunts: No atrial level shunt detected by color flow Doppler.   LEFT VENTRICLE PLAX 2D                        Biplane EF (MOD) LVIDd:         5.80 cm         LV Biplane EF:   Left LVIDs:         4.30 cm                           ventricular LV PW:         0.90 cm                          ejection LV IVS:        0.90 cm                          fraction by LVOT diam:     2.00 cm                          2D MOD LV SV:         49                               biplane is LV SV Index:   24                               42.3 %. LVOT Area:     3.14 cm Diastology LV e' medial:    3.37 cm/s LV Volumes (MOD)               LV E/e' medial:  20.2 LV vol d, MOD    139.0 ml      LV e' lateral:   9.89 cm/s A2C:                           LV E/e' lateral: 6.9 LV vol d, MOD    131.0 ml A4C: LV vol s, MOD    78.2 ml A2C: LV vol s, MOD    78.1 ml A4C: LV SV MOD A2C:   60.8 ml LV SV MOD A4C:   131.0 ml LV SV MOD BP:    57.5 ml  RIGHT VENTRICLE RV S prime:     14.40 cm/s TAPSE (M-mode): 2.4 cm  LEFT ATRIUM              Index        RIGHT ATRIUM           Index LA diam:        5.20 cm  2.50 cm/m   RA Area:     26.70 cm LA Vol (A2C):   108.0 ml 51.92 ml/m  RA Volume:   97.90 ml  47.07 ml/m LA Vol (A4C):   81.9 ml  39.38 ml/m LA Biplane Vol: 94.2 ml  45.29 ml/m AORTIC VALVE LVOT Vmax:   88.30 cm/s LVOT Vmean:  55.100 cm/s LVOT VTI:    0.157 m  AORTA Ao Root diam: 3.30 cm Ao Asc  diam:  3.90 cm  MITRAL VALVE               TRICUSPID VALVE MV Area (PHT): 3.46 cm    TR Peak grad:   59.6 mmHg MV Decel Time: 219 msec    TR Mean grad:   33.0 mmHg MR Peak grad: 87.8 mmHg    TR Vmax:        386.00 cm/s MR Vmax:      468.50 cm/s  TR Vmean:       268.0 cm/s MV E velocity: 68.20 cm/s MV A velocity: 38.40 cm/s  SHUNTS MV E/A ratio:  1.78        Systemic VTI:  0.16 m Systemic Diam: 2.00 cm  Thomas Shutter MD Electronically signed by Thomas Shutter MD Signature Date/Time: 10/23/2022/4:05:44 PM    Final    MONITORS  LONG TERM MONITOR (3-14 DAYS) 11/11/2019  Narrative 1. NSR with frequent PVC"s and PAC's 2. NSVT 3. NS AT 4. No prolonged pauses 5. PVC burden over 35% with multifocal PVC's  Thomas [name],M.D.             Laboratory Data:  Chemistry Recent Labs  Lab 10/23/22 0955 10/23/22 1321 10/24/22 0403  NA 142  --  140  K 3.1*  --  3.3*  CL 107  --  105  CO2 26  --  25  GLUCOSE 98  --  97  BUN 15  --  20  CREATININE 1.41* 1.19 1.68*  CALCIUM 8.4*  --  8.6*  GFRNONAA 50* >60 41*  ANIONGAP 9  --  10    Recent Labs  Lab 10/24/22 0403  PROT 6.5  ALBUMIN 3.6  AST 15  ALT 14  ALKPHOS 55  BILITOT 1.6*   Hematology Recent Labs  Lab 10/23/22 1321 10/24/22 0403  WBC 5.6 5.6  RBC 3.52* 3.58*  HGB 12.2* 12.5*  HCT 38.5* 38.6*  MCV 109.4* 107.8*  MCH 34.7* 34.9*  MCHC 31.7 32.4  RDW 15.0 15.0  PLT 204 205   Cardiac EnzymesNo results for input(s): "TROPONINI" in the last 168 hours. No results for input(s): "TROPIPOC" in the last 168 hours.  BNP Recent Labs  Lab 10/23/22 0831 10/24/22 0403  BNP 1,287.6* 1,430.4*    DDimer  Recent Labs  Lab 10/23/22 0830  DDIMER 0.56*    Radiology/Studies:  ECHOCARDIOGRAM COMPLETE  Result Date: 10/23/2022    ECHOCARDIOGRAM REPORT   Patient Name:   MEREK NIU Date of Exam: 10/23/2022 Medical Rec #:  [id number]        Height:       72.0 in Accession #:    [id number]       Weight:       189.0 lb Date of Birth:  10-01-1941        BSA:          2.080 m Patient Age:    81 years         BP:           151/73 mmHg Patient Gender: M                HR:           60 bpm. Exam Location:  Inpatient Procedure: 2D Echo, Cardiac Doppler and Color Doppler Indications:    CHF - acute diastolic  History:        Patient has prior history of Echocardiogram examinations, most  recent 07/02/2019. CAD, Signs/Symptoms:Shortness of Breath; Risk                 Factors:Hypertension, Dyslipidemia and Family History of                 Coronary Artery Disease.  Sonographer:    Milda Smart Referring Phys: [id number] RAVI PAHWANI  Sonographer Comments: Image acquisition challenging due to patient body habitus and Image acquisition challenging due to  respiratory motion. IMPRESSIONS  1. Left ventricular ejection fraction, by estimation, is 40 to 45%. Left ventricular ejection fraction by 2D MOD biplane is 42.3 %. The left ventricle has mildly decreased function. The left ventricle demonstrates global hypokinesis. The left ventricular internal cavity size was mildly dilated. Left ventricular diastolic parameters are consistent with Grade II diastolic dysfunction (pseudonormalization). Elevated left ventricular end-diastolic pressure.  2. Right ventricular systolic function is normal. The right ventricular size is normal. There is severely elevated pulmonary artery systolic pressure. The estimated right ventricular systolic pressure is 67.6 mmHg.  3. Left atrial size was moderately dilated.  4. Right atrial size was moderately dilated.  5. The mitral valve is abnormal. Mild mitral valve regurgitation.  6. The aortic valve is tricuspid. Aortic valve regurgitation is trivial. Aortic valve sclerosis is present, with no evidence of aortic valve stenosis.  7. Aortic dilatation noted. There is borderline dilatation of the ascending aorta, measuring 39 mm.  8. The inferior vena cava is normal in size with <50% respiratory variability, suggesting right atrial pressure of 8 mmHg. Comparison(s): Changes from prior study are noted. 07/02/2019: LVEF 60-65%. FINDINGS  Left Ventricle: Left ventricular ejection fraction, by estimation, is 40 to 45%. Left ventricular ejection fraction by 2D MOD biplane is 42.3 %. The left ventricle has mildly decreased function. The left ventricle demonstrates global hypokinesis. The left ventricular internal cavity size was mildly dilated. There is no left ventricular hypertrophy. Left ventricular diastolic parameters are consistent with Grade II diastolic dysfunction (pseudonormalization). Elevated left ventricular end-diastolic pressure. Right Ventricle: The right ventricular size is normal. No increase in right ventricular wall thickness. Right  ventricular systolic function is normal. There is severely elevated pulmonary artery systolic pressure. The tricuspid regurgitant velocity is 3.86 m/s, and with an assumed right atrial pressure of 8 mmHg, the estimated right ventricular systolic pressure is 67.6 mmHg. Left Atrium: Left atrial size was moderately dilated. Right Atrium: Right atrial size was moderately dilated. Pericardium: Trivial pericardial effusion is present. The pericardial effusion is posterior to the left ventricle. Mitral Valve: The mitral valve is abnormal. There is mild thickening of the anterior and posterior mitral valve leaflet(s). Mild mitral valve regurgitation, with posteriorly-directed jet. Tricuspid Valve: The tricuspid valve is grossly normal. Tricuspid valve regurgitation is mild. Aortic Valve: The aortic valve is tricuspid. Aortic valve regurgitation is trivial. Aortic valve sclerosis is present, with no evidence of aortic valve stenosis. Pulmonic Valve: The pulmonic valve was grossly normal. Pulmonic valve regurgitation is trivial. Aorta: Aortic dilatation noted. There is borderline dilatation of the ascending aorta, measuring 39 mm. Venous: The inferior vena cava is normal in size with less than 50% respiratory variability, suggesting right atrial pressure of 8 mmHg. IAS/Shunts: No atrial level shunt detected by color flow Doppler.  LEFT VENTRICLE PLAX 2D                        Biplane EF (MOD) LVIDd:         5.80 cm  LV Biplane EF:   Left LVIDs:         4.30 cm                          ventricular LV PW:         0.90 cm                          ejection LV IVS:        0.90 cm                          fraction by LVOT diam:     2.00 cm                          2D MOD LV SV:         49                               biplane is LV SV Index:   24                               42.3 %. LVOT Area:     3.14 cm                                Diastology                                LV e' medial:    3.37 cm/s LV Volumes (MOD)                LV E/e' medial:  20.2 LV vol d, MOD    139.0 ml      LV e' lateral:   9.89 cm/s A2C:                           LV E/e' lateral: 6.9 LV vol d, MOD    131.0 ml A4C: LV vol s, MOD    78.2 ml A2C: LV vol s, MOD    78.1 ml A4C: LV SV MOD A2C:   60.8 ml LV SV MOD A4C:   131.0 ml LV SV MOD BP:    57.5 ml RIGHT VENTRICLE RV S prime:     14.40 cm/s TAPSE (M-mode): 2.4 cm LEFT ATRIUM              Index        RIGHT ATRIUM           Index LA diam:        5.20 cm  2.50 cm/m   RA Area:     26.70 cm LA Vol (A2C):   108.0 ml 51.92 ml/m  RA Volume:   97.90 ml  47.07 ml/m LA Vol (A4C):   81.9 ml  39.38 ml/m LA Biplane Vol: 94.2 ml  45.29 ml/m  AORTIC VALVE LVOT Vmax:   88.30 cm/s LVOT Vmean:  55.100 cm/s LVOT VTI:    0.157 m  AORTA Ao Root diam: 3.30 cm Ao Asc diam:  3.90 cm MITRAL VALVE  TRICUSPID VALVE MV Area (PHT): 3.46 cm    TR Peak grad:   59.6 mmHg MV Decel Time: 219 msec    TR Mean grad:   33.0 mmHg MR Peak grad: 87.8 mmHg    TR Vmax:        386.00 cm/s MR Vmax:      468.50 cm/s  TR Vmean:       268.0 cm/s MV E velocity: 68.20 cm/s MV A velocity: 38.40 cm/s  SHUNTS MV E/A ratio:  1.78        Systemic VTI:  0.16 m                            Systemic Diam: 2.00 cm Thomas Shutter MD Electronically signed by Thomas Shutter MD Signature Date/Time: 10/23/2022/4:05:44 PM    Final     Assessment and Plan:   Coronary Artery Disease; Obstructive/Nonobstructive - with AKI and sinus bradycardia - symptomatic  - continue ASA 81 mg; Continue plavix lifelong - continue statin, goal LDL < 55; lipids for 10/25/22  - no BB (bradycardia) - we will tentatively plan for LHC and RHC tomorrow, but would hold for worsening AKI  Risks and benefits of cardiac catheterization have been discussed with the patient.  These include bleeding, infection, kidney damage, stroke, heart attack, death.  The patient understands these risks and is willing to proceed.  Access recommendations: R radial Procedural  considerations prior: He's had thrombus formation on the proximal edge of the stent while off plavix which extended out over the left circumflex artery. On lifetime plavix  Heart Failure reduced Ejection Fraction  AKI Hypokalemia Bilirubinemia NOS - NYHA class I, Stage C, euvolemic, etiology from ischemic suspected - holding lasix daily - no BB as above - ACEi to ARB with plans for ARN Transition (last ACEi 10/24/22) - no SGLT2i or MRA until kidney function improved  Bradycardia PACs/PVCs - AAD: amiodarone 200 mg 6 days a week; will continue for now  Aorta is ULN  NPO at midnigt  For questions or updates, please contact [facility] Please consult [email or web address] for contact info under Cardiology/STEMI.   Thomas Lam, MD FASE [facility] Cardiologist [facility]  [address] [place], [address] [place], [place] [address] [phone]  2:22 PM

## 2022-10-24 NOTE — Progress Notes (Signed)
Mobility Specialist - Progress Note  Pre-mobility: 56 bpm HR, During mobility: 83 bpm HR, Post-mobility: 61 bpm HR,  10/24/22 1341  Mobility  Activity Ambulated independently in hallway  Level of Assistance Independent after set-up  Assistive Device None  Distance Ambulated (ft) 350 ft  Range of Motion/Exercises Active  Activity Response Tolerated well  Mobility Referral Yes  $Mobility charge 1 Mobility   Pt was found in bed and agreeable to ambulate. Had no complaints during ambulation and at EOS returned to bed with necessities in reach.  Billey Chang Mobility Specialist

## 2022-10-24 NOTE — Progress Notes (Signed)
PROGRESS NOTE    RALIEGH SCOBIE  WFU:[id number] DOB: 07/23/1941 DOA: 10/23/2022 PCP: Kaleen Mask, MD   Brief Narrative:  HPI: ROCZEN WAYMIRE is a 81 y.o. male with medical history significant of CAD, hypertension, hyperlipidemia, hypothyroidism and many other comorbidities as listed below presented to ED with a complaint of shortness of breath.  According to patient, he initially had shortness of breath the night before which was brief but it resolved but then last night, he had it again.  Per him, he feels short of breath with laying flat but feels better with sitting up.  No cough, fever, chills, palpitation, chest pain, any problem with urination or with bowel movement.  No recent travel or sick contact.   ED Course: Upon arrival to ED, he was slightly bradycardic and hypertensive.  He was not hypoxic.  BNP was significantly elevated but chest x-ray showed diffuse peribronchial cuffing and interstitial prominence concerning for acute bronchitis.  He was also found to have hypokalemia.  Patient has not been given any replacement for potassium or any Lasix.  Hospital service was consulted for CHF exacerbation admission.  Assessment & Plan:   Principal Problem:   CHF (congestive heart failure) (HCC) Active Problems:   CAD (coronary artery disease)   HTN (hypertension)   Hyperlipidemia   Hypokalemia   Hypomagnesemia   Acute combined systolic and diastolic congestive heart failure (HCC)  Dyspnea/acute combined systolic and diastolic congestive heart failure: Chest x-ray does not show any pulmonary edema but he did have crackles and had significantly elevated BNP suggestive of possible new CHF.  Last echo was done 3 years ago which was almost normal.  Echo this admission shows 40 to 45% ejection fraction with grade 2 diastolic dysfunction.  This appears to be acute.  I will continue IV Lasix 40 mg daily.  I have consulted cardiology for further management.  Patient may need  cardiac cath.    Hypokalemia: Low again, will replace.   CAD s/p PTCA and stent in LAD: Currently asymptomatic.  Continue aspirin and Plavix.   History of bigeminy: Continue amiodarone.   Essential hypertension: Stable.  Continue lisinopril.   Acquired hypothyroidism: Continue Synthroid.   Hyperlipidemia: Resume Crestor.   BPH: Resume Flomax.  DVT prophylaxis: enoxaparin (LOVENOX) injection 40 mg Start: 10/23/22 1800   Code Status: Full Code  Family Communication: Grandson present at bedside.  Plan of care discussed with patient in length and he/she verbalized understanding and agreed with it.  Status is: Observation The patient will require care spanning > 2 midnights and should be moved to inpatient because: Patient requires further work-up by cardiology and IV diuresis.   Estimated body mass index is 23.74 kg/m as calculated from the following:   Height as of this encounter: 6' (1.829 m).   Weight as of this encounter: 79.4 kg.    Nutritional Assessment: Body mass index is 23.74 kg/m.[name] Seen by dietician.  I agree with the assessment and plan as outlined below: Nutrition Status:        . Skin Assessment: I have examined the patient's skin and I agree with the wound assessment as performed by the wound care RN as outlined below:    Consultants:  Cardiology  Procedures:  None  Antimicrobials:  Anti-infectives (From admission, onward)    None         Subjective: Patient seen and examined.  He states that he still has shortness of breath, mostly orthopnea.  With very little improvement compared  to yesterday.  No other complaint but he became very emotional and sad after hearing about echo results.  Objective: Vitals:   10/24/22 0440 10/24/22 0500 10/24/22 0817 10/24/22 1243  BP: (!) 148/73  (!) 144/72 122/69  Pulse: (!) 51  61 (!) 56  Resp: 18  20 (!) 22  Temp: 98.2 F (36.8 C)  98.3 F (36.8 C) 97.6 F (36.4 C)  TempSrc: Oral  Oral Oral  SpO2:  97%  95% 98%  Weight:  79.4 kg    Height:        Intake/Output Summary (Last 24 hours) at 10/24/2022 1328 Last data filed at 10/24/2022 1100 Gross per 24 hour  Intake 1080 ml  Output 2125 ml  Net -1045 ml   Filed Weights   10/23/22 2028 10/24/22 0500  Weight: 81.6 kg 79.4 kg    Examination:  General exam: Appears calm and comfortable  Respiratory system: Very faint bibasilar crackles, slightly improved compared to yesterday. Respiratory effort normal. Cardiovascular system: S1 & S2 heard, RRR. No JVD, murmurs, rubs, gallops or clicks. No pedal edema. Gastrointestinal system: Abdomen is nondistended, soft and nontender. No organomegaly or masses felt. Normal bowel sounds heard. Central nervous system: Alert and oriented. No focal neurological deficits. Extremities: Symmetric 5 x 5 power. Skin: No rashes, lesions or ulcers Psychiatry: Judgement and insight appear normal. Mood & affect appropriate.    Data Reviewed: I have personally reviewed following labs and imaging studies  CBC: Recent Labs  Lab 10/23/22 0830 10/23/22 1321 10/24/22 0403  WBC 5.8 5.6 5.6  HGB 12.8* 12.2* 12.5*  HCT 39.9 38.5* 38.6*  MCV 108.4* 109.4* 107.8*  PLT 213 204 205   Basic Metabolic Panel: Recent Labs  Lab 10/23/22 0955 10/23/22 1321 10/24/22 0403  NA 142  --  140  K 3.1*  --  3.3*  CL 107  --  105  CO2 26  --  25  GLUCOSE 98  --  97  BUN 15  --  20  CREATININE 1.41* 1.19 1.68*  CALCIUM 8.4*  --  8.6*  MG  --  1.2*  --    GFR: Estimated Creatinine Clearance: 38.5 mL/min (A) (by C-G formula based on SCr of 1.68 mg/dL (H)). Liver Function Tests: Recent Labs  Lab 10/24/22 0403  AST 15  ALT 14  ALKPHOS 55  BILITOT 1.6*  PROT 6.5  ALBUMIN 3.6   No results for input(s): "LIPASE", "AMYLASE" in the last 168 hours. No results for input(s): "AMMONIA" in the last 168 hours. Coagulation Profile: No results for input(s): "INR", "PROTIME" in the last 168 hours. Cardiac  Enzymes: No results for input(s): "CKTOTAL", "CKMB", "CKMBINDEX", "TROPONINI" in the last 168 hours. BNP (last 3 results) No results for input(s): "PROBNP" in the last 8760 hours. HbA1C: No results for input(s): "HGBA1C" in the last 72 hours. CBG: Recent Labs  Lab 10/24/22 0744  GLUCAP 87   Lipid Profile: No results for input(s): "CHOL", "HDL", "LDLCALC", "TRIG", "CHOLHDL", "LDLDIRECT" in the last 72 hours. Thyroid Function Tests: Recent Labs    10/23/22 1321  TSH 2.203   Anemia Panel: No results for input(s): "VITAMINB12", "FOLATE", "FERRITIN", "TIBC", "IRON", "RETICCTPCT" in the last 72 hours. Sepsis Labs: Recent Labs  Lab 10/23/22 1321  PROCALCITON 19.80    Recent Results (from the past 240 hour(s))  SARS Coronavirus 2 by RT PCR (hospital order, performed in [facility] hospital lab) *cepheid single result test* Anterior Nasal Swab     Status: None  Collection Time: 10/23/22  1:21 PM   Specimen: Anterior Nasal Swab  Result Value Ref Range Status   SARS Coronavirus 2 by RT PCR NEGATIVE NEGATIVE Final    Comment: (NOTE) SARS-CoV-2 target nucleic acids are NOT DETECTED.  The SARS-CoV-2 RNA is generally detectable in upper and lower respiratory specimens during the acute phase of infection. The lowest concentration of SARS-CoV-2 viral copies this assay can detect is 250 copies / mL. A negative result does not preclude SARS-CoV-2 infection and should not be used as the sole basis for treatment or other patient management decisions.  A negative result may occur with improper specimen collection / handling, submission of specimen other than nasopharyngeal swab, presence of viral mutation(s) within the areas targeted by this assay, and inadequate number of viral copies (<250 copies / mL). A negative result must be combined with clinical observations, patient history, and epidemiological information.  Fact Sheet for Patients:    RoadLapTop.co.za  Fact Sheet for Healthcare Providers: http://kim-miller.com/  This test is not yet approved or  cleared by the [place] FDA and has been authorized for detection and/or diagnosis of SARS-CoV-2 by FDA under an Emergency Use Authorization (EUA).  This EUA will remain in effect (meaning this test can be used) for the duration of the COVID-19 declaration under Section 564(b)(1) of the Act, 21 U.S.C. section 360bbb-3(b)(1), unless the authorization is terminated or revoked sooner.  Performed at [facility], [address], [place], [place] [address]   Respiratory (~20 pathogens) panel by PCR     Status: None   Collection Time: 10/23/22  1:21 PM   Specimen: Anterior Nasal Swab; Respiratory  Result Value Ref Range Status   Adenovirus NOT DETECTED NOT DETECTED Final   Coronavirus 229E NOT DETECTED NOT DETECTED Final    Comment: (NOTE) The Coronavirus on the Respiratory Panel, DOES NOT test for the novel  Coronavirus (2019 nCoV)    Coronavirus HKU1 NOT DETECTED NOT DETECTED Final   Coronavirus NL63 NOT DETECTED NOT DETECTED Final   Coronavirus OC43 NOT DETECTED NOT DETECTED Final   Metapneumovirus NOT DETECTED NOT DETECTED Final   Rhinovirus / Enterovirus NOT DETECTED NOT DETECTED Final   Influenza A NOT DETECTED NOT DETECTED Final   Influenza B NOT DETECTED NOT DETECTED Final   Parainfluenza Virus 1 NOT DETECTED NOT DETECTED Final   Parainfluenza Virus 2 NOT DETECTED NOT DETECTED Final   Parainfluenza Virus 3 NOT DETECTED NOT DETECTED Final   Parainfluenza Virus 4 NOT DETECTED NOT DETECTED Final   Respiratory Syncytial Virus NOT DETECTED NOT DETECTED Final   Bordetella pertussis NOT DETECTED NOT DETECTED Final   Bordetella Parapertussis NOT DETECTED NOT DETECTED Final   Chlamydophila pneumoniae NOT DETECTED NOT DETECTED Final   Mycoplasma pneumoniae NOT DETECTED NOT DETECTED Final    Comment:  Performed at [facility], [address], [place], [place] [address]     Radiology Studies: ECHOCARDIOGRAM COMPLETE  Result Date: 10/23/2022    ECHOCARDIOGRAM REPORT   Patient Name:   DUVALL COMES Date of Exam: 10/23/2022 Medical Rec #:  [id number]        Height:       72.0 in Accession #:    [id number]       Weight:       189.0 lb Date of Birth:  August 27, 1941        BSA:          2.080 m Patient Age:    76 years  BP:           151/73 mmHg Patient Gender: M                HR:           60 bpm. Exam Location:  Inpatient Procedure: 2D Echo, Cardiac Doppler and Color Doppler Indications:    CHF - acute diastolic  History:        Patient has prior history of Echocardiogram examinations, most                 recent 07/02/2019. CAD, Signs/Symptoms:Shortness of Breath; Risk                 Factors:Hypertension, Dyslipidemia and Family History of                 Coronary Artery Disease.  Sonographer:    Milda Smart Referring Phys: [id number] Aneya Daddona  Sonographer Comments: Image acquisition challenging due to patient body habitus and Image acquisition challenging due to respiratory motion. IMPRESSIONS  1. Left ventricular ejection fraction, by estimation, is 40 to 45%. Left ventricular ejection fraction by 2D MOD biplane is 42.3 %. The left ventricle has mildly decreased function. The left ventricle demonstrates global hypokinesis. The left ventricular internal cavity size was mildly dilated. Left ventricular diastolic parameters are consistent with Grade II diastolic dysfunction (pseudonormalization). Elevated left ventricular end-diastolic pressure.  2. Right ventricular systolic function is normal. The right ventricular size is normal. There is severely elevated pulmonary artery systolic pressure. The estimated right ventricular systolic pressure is 67.6 mmHg.  3. Left atrial size was moderately dilated.  4. Right atrial size was moderately dilated.  5. The mitral valve is abnormal. Mild mitral  valve regurgitation.  6. The aortic valve is tricuspid. Aortic valve regurgitation is trivial. Aortic valve sclerosis is present, with no evidence of aortic valve stenosis.  7. Aortic dilatation noted. There is borderline dilatation of the ascending aorta, measuring 39 mm.  8. The inferior vena cava is normal in size with <50% respiratory variability, suggesting right atrial pressure of 8 mmHg. Comparison(s): Changes from prior study are noted. 07/02/2019: LVEF 60-65%. FINDINGS  Left Ventricle: Left ventricular ejection fraction, by estimation, is 40 to 45%. Left ventricular ejection fraction by 2D MOD biplane is 42.3 %. The left ventricle has mildly decreased function. The left ventricle demonstrates global hypokinesis. The left ventricular internal cavity size was mildly dilated. There is no left ventricular hypertrophy. Left ventricular diastolic parameters are consistent with Grade II diastolic dysfunction (pseudonormalization). Elevated left ventricular end-diastolic pressure. Right Ventricle: The right ventricular size is normal. No increase in right ventricular wall thickness. Right ventricular systolic function is normal. There is severely elevated pulmonary artery systolic pressure. The tricuspid regurgitant velocity is 3.86 m/s, and with an assumed right atrial pressure of 8 mmHg, the estimated right ventricular systolic pressure is 67.6 mmHg. Left Atrium: Left atrial size was moderately dilated. Right Atrium: Right atrial size was moderately dilated. Pericardium: Trivial pericardial effusion is present. The pericardial effusion is posterior to the left ventricle. Mitral Valve: The mitral valve is abnormal. There is mild thickening of the anterior and posterior mitral valve leaflet(s). Mild mitral valve regurgitation, with posteriorly-directed jet. Tricuspid Valve: The tricuspid valve is grossly normal. Tricuspid valve regurgitation is mild. Aortic Valve: The aortic valve is tricuspid. Aortic valve  regurgitation is trivial. Aortic valve sclerosis is present, with no evidence of aortic valve stenosis. Pulmonic Valve: The pulmonic valve was grossly normal. Pulmonic valve regurgitation  is trivial. Aorta: Aortic dilatation noted. There is borderline dilatation of the ascending aorta, measuring 39 mm. Venous: The inferior vena cava is normal in size with less than 50% respiratory variability, suggesting right atrial pressure of 8 mmHg. IAS/Shunts: No atrial level shunt detected by color flow Doppler.  LEFT VENTRICLE PLAX 2D                        Biplane EF (MOD) LVIDd:         5.80 cm         LV Biplane EF:   Left LVIDs:         4.30 cm                          ventricular LV PW:         0.90 cm                          ejection LV IVS:        0.90 cm                          fraction by LVOT diam:     2.00 cm                          2D MOD LV SV:         49                               biplane is LV SV Index:   24                               42.3 %. LVOT Area:     3.14 cm                                Diastology                                LV e' medial:    3.37 cm/s LV Volumes (MOD)               LV E/e' medial:  20.2 LV vol d, MOD    139.0 ml      LV e' lateral:   9.89 cm/s A2C:                           LV E/e' lateral: 6.9 LV vol d, MOD    131.0 ml A4C: LV vol s, MOD    78.2 ml A2C: LV vol s, MOD    78.1 ml A4C: LV SV MOD A2C:   60.8 ml LV SV MOD A4C:   131.0 ml LV SV MOD BP:    57.5 ml RIGHT VENTRICLE RV S prime:     14.40 cm/s TAPSE (M-mode): 2.4 cm LEFT ATRIUM              Index        RIGHT ATRIUM           Index LA diam:  5.20 cm  2.50 cm/m   RA Area:     26.70 cm LA Vol (A2C):   108.0 ml 51.92 ml/m  RA Volume:   97.90 ml  47.07 ml/m LA Vol (A4C):   81.9 ml  39.38 ml/m LA Biplane Vol: 94.2 ml  45.29 ml/m  AORTIC VALVE LVOT Vmax:   88.30 cm/s LVOT Vmean:  55.100 cm/s LVOT VTI:    0.157 m  AORTA Ao Root diam: 3.30 cm Ao Asc diam:  3.90 cm MITRAL VALVE               TRICUSPID VALVE MV  Area (PHT): 3.46 cm    TR Peak grad:   59.6 mmHg MV Decel Time: 219 msec    TR Mean grad:   33.0 mmHg MR Peak grad: 87.8 mmHg    TR Vmax:        386.00 cm/s MR Vmax:      468.50 cm/s  TR Vmean:       268.0 cm/s MV E velocity: 68.20 cm/s MV A velocity: 38.40 cm/s  SHUNTS MV E/A ratio:  1.78        Systemic VTI:  0.16 m                            Systemic Diam: 2.00 cm Zoila Shutter MD Electronically signed by Zoila Shutter MD Signature Date/Time: 10/23/2022/4:05:44 PM    Final    CT Angio Chest PE W and/or Wo Contrast  Result Date: 10/23/2022 CLINICAL DATA:  Shortness of breath. EXAM: CT ANGIOGRAPHY CHEST WITH CONTRAST TECHNIQUE: Multidetector CT imaging of the chest was performed using the standard protocol during bolus administration of intravenous contrast. Multiplanar CT image reconstructions and MIPs were obtained to evaluate the vascular anatomy. RADIATION DOSE REDUCTION: This exam was performed according to the departmental dose-optimization program which includes automated exposure control, adjustment of the mA and/or kV according to patient size and/or use of iterative reconstruction technique. CONTRAST:  OMNIPAQUE IOHEXOL 350 MG/ML SOLN COMPARISON:  Plain film of earlier today.  No prior chest CT. FINDINGS: Cardiovascular: The quality of this exam for evaluation of pulmonary embolism is moderate. Although the bolus is relatively well timed, there is moderate motion degradation and the patient's arms are not raised above the head. No pulmonary embolism to the large segmental level. Aortic atherosclerosis. Upper normal ascending aortic caliber at 3.9 cm. Mild cardiomegaly with moderate right heart dilatation. Lad and left circumflex coronary artery calcification. Pulmonary artery enlargement, outflow tract 3.8 cm Mediastinum/Nodes: No mediastinal or hilar adenopathy. Lungs/Pleura: Bilateral pleural effusions. Mild centrilobular emphysema. Dependent subsegmental atelectasis in both lower lobes.  Right upper lobe 7 mm pulmonary nodule on 30/10. Left upper lobe 3 mm nodule on 51/10. Calcified granuloma in the right middle lobe. Upper Abdomen: Cholecystectomy. Normal imaged portions of the liver, spleen, stomach, pancreas, adrenal glands. 3 mm upper pole right renal collecting system calculus. Musculoskeletal: Mid and lower thoracic spondylosis. Review of the MIP images confirms the above findings. IMPRESSION: 1. Moderate motion and patient position degradation. No pulmonary embolism with limitations above. 2. Small bilateral pleural effusions. 3. Pulmonary artery enlargement suggests pulmonary arterial hypertension. 4. Aortic atherosclerosis (ICD10-I70.0), coronary artery atherosclerosis and emphysema (ICD10-J43.9). 5. Pulmonary nodules of maximally 7 mm. If the patient is at high risk for bronchogenic carcinoma, follow-up chest CT at 3-6 months is recommended. If the patient is at low risk for bronchogenic carcinoma, follow-up chest CT at 6-12 months  is recommended. This recommendation follows the consensus statement: "Guidelines for Management of Small Pulmonary Nodules Detected on CT Scans: A Statement from the Fleischner Society" as published in Radiology 2005; 237:395-400. Online at: DietDisorder.czhttp://www.med.[employer].edu/rad/res/Fleischner-nodule.htm. 6. Right nephrolithiasis Electronically Signed   By: Jeronimo GreavesKyle  Talbot M.D.   On: 10/23/2022 11:32   DG Chest Port 1 View  Result Date: 10/23/2022 CLINICAL DATA:  81 year old male with history of shortness of breath. Orthopnea. Left-sided chest pain. EXAM: PORTABLE CHEST 1 VIEW COMPARISON:  Chest x-ray 07/18/2022. FINDINGS: Skin fold artifact over the upper right hemithorax. Lung volumes are normal. Mild diffuse interstitial prominence and peribronchial cuffing, most evident in the lung bases. No consolidative airspace disease. No pleural effusions. No pneumothorax. No pulmonary nodule or mass noted. Pulmonary vasculature and the cardiomediastinal silhouette are within  normal limits. Atherosclerotic calcifications in the thoracic aorta. IMPRESSION: 1. Diffuse peribronchial cuffing and interstitial prominence concerning for an acute bronchitis. 2. Aortic atherosclerosis. Electronically Signed   By: Trudie Reedaniel  Entrikin M.D.   On: 10/23/2022 08:36    Scheduled Meds:  amiodarone  200 mg Oral Once per day on Mon Tue Wed Thu Fri Sat   aspirin EC  81 mg Oral Daily   clopidogrel  75 mg Oral Q breakfast   enoxaparin (LOVENOX) injection  40 mg Subcutaneous Q24H   furosemide  40 mg Intravenous Daily   levothyroxine  150 mcg Oral Daily   lisinopril  10 mg Oral Daily   rosuvastatin  10 mg Oral Daily   Continuous Infusions:   LOS: 0 days   Hughie Clossavi Caliana Spires, MD [facility]  10/24/2022, 1:28 PM   *Please note that this is a verbal dictation therefore any spelling or grammatical errors are due to the "Dragon Medical One" system interpretation.  Please page via Amion and do not message via secure chat for urgent patient care matters. Secure chat can be used for non urgent patient care matters.  How to contact the [facility] Attending or Consulting provider 7A - 7P or covering provider during after hours 7P -7A, for this patient?  Check the care team in [facility] and look for a) attending/consulting [facility] provider listed and b) the [facility] team listed. Page or secure chat 7A-7P. Log into [email or web address] and use [facility]'s universal password to access. If you do not have the password, please contact the hospital operator. Locate the [facility] provider you are looking for under [facility] and page to a number that you can be directly reached. If you still have difficulty reaching the provider, please page the [facility] (Director on Call) for the Hospitalists listed on amion for assistance.

## 2022-10-25 ENCOUNTER — Encounter (HOSPITAL_COMMUNITY): Payer: Self-pay | Admitting: Family Medicine

## 2022-10-25 DIAGNOSIS — N400 Enlarged prostate without lower urinary tract symptoms: Secondary | ICD-10-CM | POA: Diagnosis present

## 2022-10-25 DIAGNOSIS — Z8616 Personal history of COVID-19: Secondary | ICD-10-CM | POA: Diagnosis not present

## 2022-10-25 DIAGNOSIS — I77810 Thoracic aortic ectasia: Secondary | ICD-10-CM | POA: Diagnosis present

## 2022-10-25 DIAGNOSIS — I452 Bifascicular block: Secondary | ICD-10-CM | POA: Diagnosis present

## 2022-10-25 DIAGNOSIS — E785 Hyperlipidemia, unspecified: Secondary | ICD-10-CM | POA: Diagnosis present

## 2022-10-25 DIAGNOSIS — Z8249 Family history of ischemic heart disease and other diseases of the circulatory system: Secondary | ICD-10-CM | POA: Diagnosis not present

## 2022-10-25 DIAGNOSIS — N1832 Chronic kidney disease, stage 3b: Secondary | ICD-10-CM | POA: Diagnosis present

## 2022-10-25 DIAGNOSIS — I4891 Unspecified atrial fibrillation: Secondary | ICD-10-CM | POA: Diagnosis present

## 2022-10-25 DIAGNOSIS — E876 Hypokalemia: Secondary | ICD-10-CM | POA: Diagnosis present

## 2022-10-25 DIAGNOSIS — Z955 Presence of coronary angioplasty implant and graft: Secondary | ICD-10-CM | POA: Diagnosis not present

## 2022-10-25 DIAGNOSIS — I5020 Unspecified systolic (congestive) heart failure: Secondary | ICD-10-CM

## 2022-10-25 DIAGNOSIS — I493 Ventricular premature depolarization: Secondary | ICD-10-CM | POA: Diagnosis present

## 2022-10-25 DIAGNOSIS — R001 Bradycardia, unspecified: Secondary | ICD-10-CM | POA: Diagnosis present

## 2022-10-25 DIAGNOSIS — Z79899 Other long term (current) drug therapy: Secondary | ICD-10-CM | POA: Diagnosis not present

## 2022-10-25 DIAGNOSIS — R7989 Other specified abnormal findings of blood chemistry: Secondary | ICD-10-CM | POA: Diagnosis not present

## 2022-10-25 DIAGNOSIS — I5041 Acute combined systolic (congestive) and diastolic (congestive) heart failure: Secondary | ICD-10-CM | POA: Diagnosis present

## 2022-10-25 DIAGNOSIS — I2510 Atherosclerotic heart disease of native coronary artery without angina pectoris: Secondary | ICD-10-CM | POA: Diagnosis present

## 2022-10-25 DIAGNOSIS — Z7902 Long term (current) use of antithrombotics/antiplatelets: Secondary | ICD-10-CM | POA: Diagnosis not present

## 2022-10-25 DIAGNOSIS — Z1152 Encounter for screening for COVID-19: Secondary | ICD-10-CM | POA: Diagnosis not present

## 2022-10-25 DIAGNOSIS — E782 Mixed hyperlipidemia: Secondary | ICD-10-CM | POA: Diagnosis not present

## 2022-10-25 DIAGNOSIS — I25118 Atherosclerotic heart disease of native coronary artery with other forms of angina pectoris: Secondary | ICD-10-CM | POA: Diagnosis not present

## 2022-10-25 DIAGNOSIS — Z79890 Hormone replacement therapy (postmenopausal): Secondary | ICD-10-CM | POA: Diagnosis not present

## 2022-10-25 DIAGNOSIS — E039 Hypothyroidism, unspecified: Secondary | ICD-10-CM | POA: Diagnosis present

## 2022-10-25 DIAGNOSIS — Z7982 Long term (current) use of aspirin: Secondary | ICD-10-CM | POA: Diagnosis not present

## 2022-10-25 DIAGNOSIS — I130 Hypertensive heart and chronic kidney disease with heart failure and stage 1 through stage 4 chronic kidney disease, or unspecified chronic kidney disease: Secondary | ICD-10-CM | POA: Diagnosis present

## 2022-10-25 DIAGNOSIS — R918 Other nonspecific abnormal finding of lung field: Secondary | ICD-10-CM | POA: Diagnosis present

## 2022-10-25 DIAGNOSIS — D539 Nutritional anemia, unspecified: Secondary | ICD-10-CM | POA: Diagnosis present

## 2022-10-25 DIAGNOSIS — R0601 Orthopnea: Secondary | ICD-10-CM | POA: Diagnosis present

## 2022-10-25 LAB — BASIC METABOLIC PANEL
Anion gap: 6 (ref 5–15)
BUN: 28 mg/dL — ABNORMAL HIGH (ref 8–23)
CO2: 27 mmol/L (ref 22–32)
Calcium: 9.1 mg/dL (ref 8.9–10.3)
Chloride: 105 mmol/L (ref 98–111)
Creatinine, Ser: 1.78 mg/dL — ABNORMAL HIGH (ref 0.61–1.24)
GFR, Estimated: 38 mL/min — ABNORMAL LOW (ref >60)
Glucose, Bld: 104 mg/dL — ABNORMAL HIGH (ref 70–99)
Potassium: 3.9 mmol/L (ref 3.5–5.1)
Sodium: 138 mmol/L (ref 135–145)

## 2022-10-25 NOTE — H&P (View-Only) (Signed)
Progress Note  Patient Name: Thomas [name] Date of Encounter: 10/25/2022  Primary Cardiologist: Kristeen Miss, MD  Subjective   Feeling much better, walked around unit without any CP/SOB. However, states that if he puts his legs up and head back, the discomfort returns though not as severe as the other night. No fevers or chills.  Inpatient Medications    Scheduled Meds:  amiodarone  200 mg Oral Once per day on Mon Tue Wed Thu Fri Sat   aspirin EC  81 mg Oral Daily   clopidogrel  75 mg Oral Q breakfast   enoxaparin (LOVENOX) injection  40 mg Subcutaneous Q24H   levothyroxine  150 mcg Oral Daily   rosuvastatin  10 mg Oral Daily   sodium chloride flush  3 mL Intravenous Q12H   Continuous Infusions:  sodium chloride     sodium chloride 10 mL/hr at 10/25/22 0643   PRN Meds: sodium chloride, acetaminophen **OR** acetaminophen, ondansetron **OR** ondansetron (ZOFRAN) IV, sodium chloride flush   Vital Signs    Vitals:   10/24/22 1651 10/24/22 2029 10/25/22 0500 10/25/22 0507  BP:  134/64  131/82  Pulse:  (!) 52  (!) 50  Resp:  18  16  Temp:  98 F (36.7 C)  98 F (36.7 C)  TempSrc:  Oral  Oral  SpO2:  97%  95%  Weight: 80.3 kg  80.6 kg   Height:        Intake/Output Summary (Last 24 hours) at 10/25/2022 0844 Last data filed at 10/25/2022 0214 Gross per 24 hour  Intake 1080 ml  Output 1100 ml  Net -20 ml      10/25/2022    5:00 AM 10/24/2022    4:51 PM 10/24/2022    5:00 AM  Last 3 Weights  Weight (lbs) 177 lb 11.2 oz 177 lb 1.6 oz 175 lb 0.7 oz  Weight (kg) 80.604 kg 80.332 kg 79.4 kg     Telemetry    NSR/SB nadir 49bpm occasional PACs rare junctional beat - Personally Reviewed  ECG    No new tracings - Personally Reviewed  Physical Exam   GEN: No acute distress.  HEENT: Normocephalic, atraumatic, sclera non-icteric. Neck: No JVD or bruits. Cardiac: RRR no murmurs, rubs, or gallops.  Respiratory: Clear to auscultation bilaterally. Breathing is  unlabored. GI: Soft, nontender, non-distended, BS +x 4. MS: no deformity. Skin discoloration of upper extremities Extremities: No clubbing or cyanosis. No edema. Distal pedal pulses are 2+ and equal bilaterally. Neuro:  AAOx3. Follows commands. Psych:  Responds to questions appropriately with a normal affect.  Labs    High Sensitivity Troponin:   Recent Labs  Lab 10/23/22 0830 10/23/22 0955  TROPONINIHS 37* 47*      Cardiac EnzymesNo results for input(s): "TROPONINI" in the last 168 hours. No results for input(s): "TROPIPOC" in the last 168 hours.   Chemistry Recent Labs  Lab 10/23/22 0955 10/23/22 1321 10/24/22 0403 10/25/22 0343  NA 142  --  140 138  K 3.1*  --  3.3* 3.9  CL 107  --  105 105  CO2 26  --  25 27  GLUCOSE 98  --  97 104*  BUN 15  --  20 28*  CREATININE 1.41* 1.19 1.68* 1.78*  CALCIUM 8.4*  --  8.6* 9.1  PROT  --   --  6.5  --   ALBUMIN  --   --  3.6  --   AST  --   --  15  --   ALT  --   --  14  --   ALKPHOS  --   --  55  --   BILITOT  --   --  1.6*  --   GFRNONAA 50* >60 41* 38*  ANIONGAP 9  --  10 6     Hematology Recent Labs  Lab 10/23/22 0830 10/23/22 1321 10/24/22 0403  WBC 5.8 5.6 5.6  RBC 3.68* 3.52* 3.58*  HGB 12.8* 12.2* 12.5*  HCT 39.9 38.5* 38.6*  MCV 108.4* 109.4* 107.8*  MCH 34.8* 34.7* 34.9*  MCHC 32.1 31.7 32.4  RDW 15.0 15.0 15.0  PLT 213 204 205    BNP Recent Labs  Lab 10/23/22 0831 10/24/22 0403  BNP 1,287.6* 1,430.4*     DDimer  Recent Labs  Lab 10/23/22 0830  DDIMER 0.56*     Radiology    ECHOCARDIOGRAM COMPLETE  Result Date: 10/23/2022    ECHOCARDIOGRAM REPORT   Patient Name:   Thomas [name] Date of Exam: 10/23/2022 Medical Rec #:  [id number]        Height:       72.0 in Accession #:    [id number]       Weight:       189.0 lb Date of Birth:  1940-12-14        BSA:          2.080 m Patient Age:    81 years         BP:           151/73 mmHg Patient Gender: M                HR:           60 bpm. Exam  Location:  Inpatient Procedure: 2D Echo, Cardiac Doppler and Color Doppler Indications:    CHF - acute diastolic  History:        Patient has prior history of Echocardiogram examinations, most                 recent 07/02/2019. CAD, Signs/Symptoms:Shortness of Breath; Risk                 Factors:Hypertension, Dyslipidemia and Family History of                 Coronary Artery Disease.  Sonographer:    Milda Smart Referring Phys: [id number] RAVI PAHWANI  Sonographer Comments: Image acquisition challenging due to patient body habitus and Image acquisition challenging due to respiratory motion. IMPRESSIONS  1. Left ventricular ejection fraction, by estimation, is 40 to 45%. Left ventricular ejection fraction by 2D MOD biplane is 42.3 %. The left ventricle has mildly decreased function. The left ventricle demonstrates global hypokinesis. The left ventricular internal cavity size was mildly dilated. Left ventricular diastolic parameters are consistent with Grade II diastolic dysfunction (pseudonormalization). Elevated left ventricular end-diastolic pressure.  2. Right ventricular systolic function is normal. The right ventricular size is normal. There is severely elevated pulmonary artery systolic pressure. The estimated right ventricular systolic pressure is 67.6 mmHg.  3. Left atrial size was moderately dilated.  4. Right atrial size was moderately dilated.  5. The mitral valve is abnormal. Mild mitral valve regurgitation.  6. The aortic valve is tricuspid. Aortic valve regurgitation is trivial. Aortic valve sclerosis is present, with no evidence of aortic valve stenosis.  7. Aortic dilatation noted. There is borderline dilatation of the ascending aorta, measuring 39 mm.  8. The inferior  vena cava is normal in size with <50% respiratory variability, suggesting right atrial pressure of 8 mmHg. Comparison(s): Changes from prior study are noted. 07/02/2019: LVEF 60-65%. FINDINGS  Left Ventricle: Left ventricular ejection  fraction, by estimation, is 40 to 45%. Left ventricular ejection fraction by 2D MOD biplane is 42.3 %. The left ventricle has mildly decreased function. The left ventricle demonstrates global hypokinesis. The left ventricular internal cavity size was mildly dilated. There is no left ventricular hypertrophy. Left ventricular diastolic parameters are consistent with Grade II diastolic dysfunction (pseudonormalization). Elevated left ventricular end-diastolic pressure. Right Ventricle: The right ventricular size is normal. No increase in right ventricular wall thickness. Right ventricular systolic function is normal. There is severely elevated pulmonary artery systolic pressure. The tricuspid regurgitant velocity is 3.86 m/s, and with an assumed right atrial pressure of 8 mmHg, the estimated right ventricular systolic pressure is 67.6 mmHg. Left Atrium: Left atrial size was moderately dilated. Right Atrium: Right atrial size was moderately dilated. Pericardium: Trivial pericardial effusion is present. The pericardial effusion is posterior to the left ventricle. Mitral Valve: The mitral valve is abnormal. There is mild thickening of the anterior and posterior mitral valve leaflet(s). Mild mitral valve regurgitation, with posteriorly-directed jet. Tricuspid Valve: The tricuspid valve is grossly normal. Tricuspid valve regurgitation is mild. Aortic Valve: The aortic valve is tricuspid. Aortic valve regurgitation is trivial. Aortic valve sclerosis is present, with no evidence of aortic valve stenosis. Pulmonic Valve: The pulmonic valve was grossly normal. Pulmonic valve regurgitation is trivial. Aorta: Aortic dilatation noted. There is borderline dilatation of the ascending aorta, measuring 39 mm. Venous: The inferior vena cava is normal in size with less than 50% respiratory variability, suggesting right atrial pressure of 8 mmHg. IAS/Shunts: No atrial level shunt detected by color flow Doppler.  LEFT VENTRICLE PLAX 2D                         Biplane EF (MOD) LVIDd:         5.80 cm         LV Biplane EF:   Left LVIDs:         4.30 cm                          ventricular LV PW:         0.90 cm                          ejection LV IVS:        0.90 cm                          fraction by LVOT diam:     2.00 cm                          2D MOD LV SV:         49                               biplane is LV SV Index:   24                               42.3 %. LVOT Area:     3.14   cm                                Diastology                                LV e' medial:    3.37 cm/s LV Volumes (MOD)               LV E/e' medial:  20.2 LV vol d, MOD    139.0 ml      LV e' lateral:   9.89 cm/s A2C:                           LV E/e' lateral: 6.9 LV vol d, MOD    131.0 ml A4C: LV vol s, MOD    78.2 ml A2C: LV vol s, MOD    78.1 ml A4C: LV SV MOD A2C:   60.8 ml LV SV MOD A4C:   131.0 ml LV SV MOD BP:    57.5 ml RIGHT VENTRICLE RV S prime:     14.40 cm/s TAPSE (M-mode): 2.4 cm LEFT ATRIUM              Index        RIGHT ATRIUM           Index LA diam:        5.20 cm  2.50 cm/m   RA Area:     26.70 cm LA Vol (A2C):   108.0 ml 51.92 ml/m  RA Volume:   97.90 ml  47.07 ml/m LA Vol (A4C):   81.9 ml  39.38 ml/m LA Biplane Vol: 94.2 ml  45.29 ml/m  AORTIC VALVE LVOT Vmax:   88.30 cm/s LVOT Vmean:  55.100 cm/s LVOT VTI:    0.157 m  AORTA Ao Root diam: 3.30 cm Ao Asc diam:  3.90 cm MITRAL VALVE               TRICUSPID VALVE MV Area (PHT): 3.46 cm    TR Peak grad:   59.6 mmHg MV Decel Time: 219 msec    TR Mean grad:   33.0 mmHg MR Peak grad: 87.8 mmHg    TR Vmax:        386.00 cm/s MR Vmax:      468.50 cm/s  TR Vmean:       268.0 cm/s MV E velocity: 68.20 cm/s MV A velocity: 38.40 cm/s  SHUNTS MV E/A ratio:  1.78        Systemic VTI:  0.16 m                            Systemic Diam: 2.00 cm Zoila Shutter MD Electronically signed by Zoila Shutter MD Signature Date/Time: 10/23/2022/4:05:44 PM    Final    CT Angio Chest PE W and/or Wo Contrast  Result  Date: 10/23/2022 CLINICAL DATA:  Shortness of breath. EXAM: CT ANGIOGRAPHY CHEST WITH CONTRAST TECHNIQUE: Multidetector CT imaging of the chest was performed using the standard protocol during bolus administration of intravenous contrast. Multiplanar CT image reconstructions and MIPs were obtained to evaluate the vascular anatomy. RADIATION DOSE REDUCTION: This exam was performed according to the departmental dose-optimization program which includes automated exposure control, adjustment of the mA and/or kV according to patient size and/or  use of iterative reconstruction technique. CONTRAST:  OMNIPAQUE IOHEXOL 350 MG/ML SOLN COMPARISON:  Plain film of earlier today.  No prior chest CT. FINDINGS: Cardiovascular: The quality of this exam for evaluation of pulmonary embolism is moderate. Although the bolus is relatively well timed, there is moderate motion degradation and the patient's arms are not raised above the head. No pulmonary embolism to the large segmental level. Aortic atherosclerosis. Upper normal ascending aortic caliber at 3.9 cm. Mild cardiomegaly with moderate right heart dilatation. Lad and left circumflex coronary artery calcification. Pulmonary artery enlargement, outflow tract 3.8 cm Mediastinum/Nodes: No mediastinal or hilar adenopathy. Lungs/Pleura: Bilateral pleural effusions. Mild centrilobular emphysema. Dependent subsegmental atelectasis in both lower lobes. Right upper lobe 7 mm pulmonary nodule on 30/10. Left upper lobe 3 mm nodule on 51/10. Calcified granuloma in the right middle lobe. Upper Abdomen: Cholecystectomy. Normal imaged portions of the liver, spleen, stomach, pancreas, adrenal glands. 3 mm upper pole right renal collecting system calculus. Musculoskeletal: Mid and lower thoracic spondylosis. Review of the MIP images confirms the above findings. IMPRESSION: 1. Moderate motion and patient position degradation. No pulmonary embolism with limitations above. 2. Small bilateral  pleural effusions. 3. Pulmonary artery enlargement suggests pulmonary arterial hypertension. 4. Aortic atherosclerosis (ICD10-I70.0), coronary artery atherosclerosis and emphysema (ICD10-J43.9). 5. Pulmonary nodules of maximally 7 mm. If the patient is at high risk for bronchogenic carcinoma, follow-up chest CT at 3-6 months is recommended. If the patient is at low risk for bronchogenic carcinoma, follow-up chest CT at 6-12 months is recommended. This recommendation follows the consensus statement: "Guidelines for Management of Small Pulmonary Nodules Detected on CT Scans: A Statement from the Fleischner Society" as published in Radiology 2005; 237:395-400. Online at: DietDisorder.cz. 6. Right nephrolithiasis Electronically Signed   By: Jeronimo Greaves M.D.   On: 10/23/2022 11:32    Cardiac Studies   2D echo 10/23/22   1. Left ventricular ejection fraction, by estimation, is 40 to 45%. Left  ventricular ejection fraction by 2D MOD biplane is 42.3 %. The left  ventricle has mildly decreased function. The left ventricle demonstrates  global hypokinesis. The left  ventricular internal cavity size was mildly dilated. Left ventricular  diastolic parameters are consistent with Grade II diastolic dysfunction  (pseudonormalization). Elevated left ventricular end-diastolic pressure.   2. Right ventricular systolic function is normal. The right ventricular  size is normal. There is severely elevated pulmonary artery systolic  pressure. The estimated right ventricular systolic pressure is 67.6 mmHg.   3. Left atrial size was moderately dilated.   4. Right atrial size was moderately dilated.   5. The mitral valve is abnormal. Mild mitral valve regurgitation.   6. The aortic valve is tricuspid. Aortic valve regurgitation is trivial.  Aortic valve sclerosis is present, with no evidence of aortic valve  stenosis.   7. Aortic dilatation noted. There is borderline dilatation  of the  ascending aorta, measuring 39 mm.   8. The inferior vena cava is normal in size with <50% respiratory  variability, suggesting right atrial pressure of 8 mmHg.   Comparison(s): Changes from prior study are noted. 07/02/2019: LVEF 60-65%.   Patient Profile     81 y.o. male with CAD with Taxus DES to ostial/prox LAD (stent hangs out into LM slightly) 2004 with subsequent hx of stent thrombosis with embolization down the LCx >> Lifelong DAPT, HTN, HLD, hypothyroidism, frequent symptomatic PVCs treated with amiodarone, RBBB+LAFB, CKD stage 3b (baseline Cr 1.6-2.1 recently) admitted to [facility] with recurrent chest pain,  elevated BP. Found to have slight bradycardia, hypokalemia, EF 40-45% by echo (down from 60-65% in 2020)  Assessment & Plan    1. CAD, chest pain, mild troponin elevation - hsTroponin peaked at 47 - currently tx with ASA, Plavix, rosuvastatin - not on BB with bradycardia - will discuss final plan for cath with MD  2. Acute HFrEF with severe pulmonary HTN - has received periodic Lasix dosing this admission, none ordered today in anticipation of R/LHC - no PE on CTA - further recs pending cath outcome - last dose lisinopril 11/13 in anticipation of transitioning to ARB (given 11/13) then Entresto - but given rising Cr and need for cath, we'll hold on further ARB plans - no BB with bradycardia  3. Bradycardia, with hx of frequent PVCs on amiodarone, known bifascicular block - on amiodarone daily except Sunday, continuing for now per MD - not on BB at this time - TSH wnl  4. CKD stage 3b - review of recent Cr shows values around 1.6-2.13, peaking in 04/2022, with last OP value 1.68 in 07/2022) - 1.19 mid admission seems to be outlier  - hold off any nephrotoxic agents at this time - received CT angio 10/23/22 so need to follow closely - Cr 1.78 today, slight uptrend from admission but not dramatically different than prior baseline  5. Elevated procalcitonin level of  19.80 - ? Unclear etiology - defer infectious w/u to primary team  6. Mild MR, borderline dilation of ascending aorta - to follow as OP  7. Mild anemia with macrocytosis - per medicine team  8. Pulmonary nodules - per report, "Pulmonary nodules of maximally 7 mm. If the patient is at high risk for bronchogenic carcinoma, follow-up chest CT at 3-6 months is recommended. If the patient is at low risk for bronchogenic carcinoma, follow-up chest CT at 6-12 months is recommended" - needs OP f/u for this  For questions or updates, please contact [facility] Please consult [email or web address] for contact info under Cardiology/STEMI.  Signed, Laurann Montanaayna N Dunn, PA-C 10/25/2022, 8:44 AM     Personally seen and examined. Agree with APP above with the following comments: Had CP this AM but has been around the unit with no sx Has had Creatinine from 1 to 2.3 but the highest was around kidney stones per patient Euvolemic on exam - elevated creatinine may be from CID; will restart cardiac diet and post for 10/26/22 case; tentatively 8:30 AM will be assessed by my PA early AM to look at kidney function - hold ARB and diuretics until after cath; this may be restarted as outpatient if post cath CIN - discussed radial access with patient - updated daugther Toniann FailWendy - rest as above  Riley LamMahesh Berenis Corter, MD FASE [facility] Center[facility]ACC Cardiologist [facility]  [facility]  [address] [place], [place] [id number] [phone]  1:15 PM

## 2022-10-25 NOTE — Progress Notes (Signed)
Mobility Specialist - Progress Note   10/25/22 0953  Mobility  Activity Ambulated independently in hallway  Level of Assistance Independent  Assistive Device None  Distance Ambulated (ft) 700 ft  Range of Motion/Exercises Active  Activity Response Tolerated well  Mobility Referral Yes  $Mobility charge 1 Mobility   Pt received in bed and agreeable to mobility. No complaints during mobility. Pt to bed after session with all needs met.      [facility]

## 2022-10-25 NOTE — Progress Notes (Addendum)
Progress Note  Patient Name: Thomas [name] Date of Encounter: 10/25/2022  Primary Cardiologist: Kristeen Miss, MD  Subjective   Feeling much better, walked around unit without any CP/SOB. However, states that if he puts his legs up and head back, the discomfort returns though not as severe as the other night. No fevers or chills.  Inpatient Medications    Scheduled Meds:  amiodarone  200 mg Oral Once per day on Mon Tue Wed Thu Fri Sat   aspirin EC  81 mg Oral Daily   clopidogrel  75 mg Oral Q breakfast   enoxaparin (LOVENOX) injection  40 mg Subcutaneous Q24H   levothyroxine  150 mcg Oral Daily   rosuvastatin  10 mg Oral Daily   sodium chloride flush  3 mL Intravenous Q12H   Continuous Infusions:  sodium chloride     sodium chloride 10 mL/hr at 10/25/22 0643   PRN Meds: sodium chloride, acetaminophen **OR** acetaminophen, ondansetron **OR** ondansetron (ZOFRAN) IV, sodium chloride flush   Vital Signs    Vitals:   10/24/22 1651 10/24/22 2029 10/25/22 0500 10/25/22 0507  BP:  134/64  131/82  Pulse:  (!) 52  (!) 50  Resp:  18  16  Temp:  98 F (36.7 C)  98 F (36.7 C)  TempSrc:  Oral  Oral  SpO2:  97%  95%  Weight: 80.3 kg  80.6 kg   Height:        Intake/Output Summary (Last 24 hours) at 10/25/2022 0844 Last data filed at 10/25/2022 0214 Gross per 24 hour  Intake 1080 ml  Output 1100 ml  Net -20 ml      10/25/2022    5:00 AM 10/24/2022    4:51 PM 10/24/2022    5:00 AM  Last 3 Weights  Weight (lbs) 177 lb 11.2 oz 177 lb 1.6 oz 175 lb 0.7 oz  Weight (kg) 80.604 kg 80.332 kg 79.4 kg     Telemetry    NSR/SB nadir 49bpm occasional PACs rare junctional beat - Personally Reviewed  ECG    No new tracings - Personally Reviewed  Physical Exam   GEN: No acute distress.  HEENT: Normocephalic, atraumatic, sclera non-icteric. Neck: No JVD or bruits. Cardiac: RRR no murmurs, rubs, or gallops.  Respiratory: Clear to auscultation bilaterally. Breathing is  unlabored. GI: Soft, nontender, non-distended, BS +x 4. MS: no deformity. Skin discoloration of upper extremities Extremities: No clubbing or cyanosis. No edema. Distal pedal pulses are 2+ and equal bilaterally. Neuro:  AAOx3. Follows commands. Psych:  Responds to questions appropriately with a normal affect.  Labs    High Sensitivity Troponin:   Recent Labs  Lab 10/23/22 0830 10/23/22 0955  TROPONINIHS 37* 47*      Cardiac EnzymesNo results for input(s): "TROPONINI" in the last 168 hours. No results for input(s): "TROPIPOC" in the last 168 hours.   Chemistry Recent Labs  Lab 10/23/22 0955 10/23/22 1321 10/24/22 0403 10/25/22 0343  NA 142  --  140 138  K 3.1*  --  3.3* 3.9  CL 107  --  105 105  CO2 26  --  25 27  GLUCOSE 98  --  97 104*  BUN 15  --  20 28*  CREATININE 1.41* 1.19 1.68* 1.78*  CALCIUM 8.4*  --  8.6* 9.1  PROT  --   --  6.5  --   ALBUMIN  --   --  3.6  --   AST  --   --  15  --   ALT  --   --  14  --   ALKPHOS  --   --  55  --   BILITOT  --   --  1.6*  --   GFRNONAA 50* >60 41* 38*  ANIONGAP 9  --  10 6     Hematology Recent Labs  Lab 10/23/22 0830 10/23/22 1321 10/24/22 0403  WBC 5.8 5.6 5.6  RBC 3.68* 3.52* 3.58*  HGB 12.8* 12.2* 12.5*  HCT 39.9 38.5* 38.6*  MCV 108.4* 109.4* 107.8*  MCH 34.8* 34.7* 34.9*  MCHC 32.1 31.7 32.4  RDW 15.0 15.0 15.0  PLT 213 204 205    BNP Recent Labs  Lab 10/23/22 0831 10/24/22 0403  BNP 1,287.6* 1,430.4*     DDimer  Recent Labs  Lab 10/23/22 0830  DDIMER 0.56*     Radiology    ECHOCARDIOGRAM COMPLETE  Result Date: 10/23/2022    ECHOCARDIOGRAM REPORT   Patient Name:   Thomas [name] Date of Exam: 10/23/2022 Medical Rec #:  [id number]        Height:       72.0 in Accession #:    [id number]       Weight:       189.0 lb Date of Birth:  1940-12-14        BSA:          2.080 m Patient Age:    81 years         BP:           151/73 mmHg Patient Gender: M                HR:           60 bpm. Exam  Location:  Inpatient Procedure: 2D Echo, Cardiac Doppler and Color Doppler Indications:    CHF - acute diastolic  History:        Patient has prior history of Echocardiogram examinations, most                 recent 07/02/2019. CAD, Signs/Symptoms:Shortness of Breath; Risk                 Factors:Hypertension, Dyslipidemia and Family History of                 Coronary Artery Disease.  Sonographer:    Milda Smart Referring Phys: [id number] RAVI PAHWANI  Sonographer Comments: Image acquisition challenging due to patient body habitus and Image acquisition challenging due to respiratory motion. IMPRESSIONS  1. Left ventricular ejection fraction, by estimation, is 40 to 45%. Left ventricular ejection fraction by 2D MOD biplane is 42.3 %. The left ventricle has mildly decreased function. The left ventricle demonstrates global hypokinesis. The left ventricular internal cavity size was mildly dilated. Left ventricular diastolic parameters are consistent with Grade II diastolic dysfunction (pseudonormalization). Elevated left ventricular end-diastolic pressure.  2. Right ventricular systolic function is normal. The right ventricular size is normal. There is severely elevated pulmonary artery systolic pressure. The estimated right ventricular systolic pressure is 67.6 mmHg.  3. Left atrial size was moderately dilated.  4. Right atrial size was moderately dilated.  5. The mitral valve is abnormal. Mild mitral valve regurgitation.  6. The aortic valve is tricuspid. Aortic valve regurgitation is trivial. Aortic valve sclerosis is present, with no evidence of aortic valve stenosis.  7. Aortic dilatation noted. There is borderline dilatation of the ascending aorta, measuring 39 mm.  8. The inferior  vena cava is normal in size with <50% respiratory variability, suggesting right atrial pressure of 8 mmHg. Comparison(s): Changes from prior study are noted. 07/02/2019: LVEF 60-65%. FINDINGS  Left Ventricle: Left ventricular ejection  fraction, by estimation, is 40 to 45%. Left ventricular ejection fraction by 2D MOD biplane is 42.3 %. The left ventricle has mildly decreased function. The left ventricle demonstrates global hypokinesis. The left ventricular internal cavity size was mildly dilated. There is no left ventricular hypertrophy. Left ventricular diastolic parameters are consistent with Grade II diastolic dysfunction (pseudonormalization). Elevated left ventricular end-diastolic pressure. Right Ventricle: The right ventricular size is normal. No increase in right ventricular wall thickness. Right ventricular systolic function is normal. There is severely elevated pulmonary artery systolic pressure. The tricuspid regurgitant velocity is 3.86 m/s, and with an assumed right atrial pressure of 8 mmHg, the estimated right ventricular systolic pressure is 67.6 mmHg. Left Atrium: Left atrial size was moderately dilated. Right Atrium: Right atrial size was moderately dilated. Pericardium: Trivial pericardial effusion is present. The pericardial effusion is posterior to the left ventricle. Mitral Valve: The mitral valve is abnormal. There is mild thickening of the anterior and posterior mitral valve leaflet(s). Mild mitral valve regurgitation, with posteriorly-directed jet. Tricuspid Valve: The tricuspid valve is grossly normal. Tricuspid valve regurgitation is mild. Aortic Valve: The aortic valve is tricuspid. Aortic valve regurgitation is trivial. Aortic valve sclerosis is present, with no evidence of aortic valve stenosis. Pulmonic Valve: The pulmonic valve was grossly normal. Pulmonic valve regurgitation is trivial. Aorta: Aortic dilatation noted. There is borderline dilatation of the ascending aorta, measuring 39 mm. Venous: The inferior vena cava is normal in size with less than 50% respiratory variability, suggesting right atrial pressure of 8 mmHg. IAS/Shunts: No atrial level shunt detected by color flow Doppler.  LEFT VENTRICLE PLAX 2D                         Biplane EF (MOD) LVIDd:         5.80 cm         LV Biplane EF:   Left LVIDs:         4.30 cm                          ventricular LV PW:         0.90 cm                          ejection LV IVS:        0.90 cm                          fraction by LVOT diam:     2.00 cm                          2D MOD LV SV:         49                               biplane is LV SV Index:   24                               42.3 %. LVOT Area:     3.14  cm                                Diastology                                LV e' medial:    3.37 cm/s LV Volumes (MOD)               LV E/e' medial:  20.2 LV vol d, MOD    139.0 ml      LV e' lateral:   9.89 cm/s A2C:                           LV E/e' lateral: 6.9 LV vol d, MOD    131.0 ml A4C: LV vol s, MOD    78.2 ml A2C: LV vol s, MOD    78.1 ml A4C: LV SV MOD A2C:   60.8 ml LV SV MOD A4C:   131.0 ml LV SV MOD BP:    57.5 ml RIGHT VENTRICLE RV S prime:     14.40 cm/s TAPSE (M-mode): 2.4 cm LEFT ATRIUM              Index        RIGHT ATRIUM           Index LA diam:        5.20 cm  2.50 cm/m   RA Area:     26.70 cm LA Vol (A2C):   108.0 ml 51.92 ml/m  RA Volume:   97.90 ml  47.07 ml/m LA Vol (A4C):   81.9 ml  39.38 ml/m LA Biplane Vol: 94.2 ml  45.29 ml/m  AORTIC VALVE LVOT Vmax:   88.30 cm/s LVOT Vmean:  55.100 cm/s LVOT VTI:    0.157 m  AORTA Ao Root diam: 3.30 cm Ao Asc diam:  3.90 cm MITRAL VALVE               TRICUSPID VALVE MV Area (PHT): 3.46 cm    TR Peak grad:   59.6 mmHg MV Decel Time: 219 msec    TR Mean grad:   33.0 mmHg MR Peak grad: 87.8 mmHg    TR Vmax:        386.00 cm/s MR Vmax:      468.50 cm/s  TR Vmean:       268.0 cm/s MV E velocity: 68.20 cm/s MV A velocity: 38.40 cm/s  SHUNTS MV E/A ratio:  1.78        Systemic VTI:  0.16 m                            Systemic Diam: 2.00 cm Zoila Shutter MD Electronically signed by Zoila Shutter MD Signature Date/Time: 10/23/2022/4:05:44 PM    Final    CT Angio Chest PE W and/or Wo Contrast  Result  Date: 10/23/2022 CLINICAL DATA:  Shortness of breath. EXAM: CT ANGIOGRAPHY CHEST WITH CONTRAST TECHNIQUE: Multidetector CT imaging of the chest was performed using the standard protocol during bolus administration of intravenous contrast. Multiplanar CT image reconstructions and MIPs were obtained to evaluate the vascular anatomy. RADIATION DOSE REDUCTION: This exam was performed according to the departmental dose-optimization program which includes automated exposure control, adjustment of the mA and/or kV according to patient size and/or  use of iterative reconstruction technique. CONTRAST:  OMNIPAQUE IOHEXOL 350 MG/ML SOLN COMPARISON:  Plain film of earlier today.  No prior chest CT. FINDINGS: Cardiovascular: The quality of this exam for evaluation of pulmonary embolism is moderate. Although the bolus is relatively well timed, there is moderate motion degradation and the patient's arms are not raised above the head. No pulmonary embolism to the large segmental level. Aortic atherosclerosis. Upper normal ascending aortic caliber at 3.9 cm. Mild cardiomegaly with moderate right heart dilatation. Lad and left circumflex coronary artery calcification. Pulmonary artery enlargement, outflow tract 3.8 cm Mediastinum/Nodes: No mediastinal or hilar adenopathy. Lungs/Pleura: Bilateral pleural effusions. Mild centrilobular emphysema. Dependent subsegmental atelectasis in both lower lobes. Right upper lobe 7 mm pulmonary nodule on 30/10. Left upper lobe 3 mm nodule on 51/10. Calcified granuloma in the right middle lobe. Upper Abdomen: Cholecystectomy. Normal imaged portions of the liver, spleen, stomach, pancreas, adrenal glands. 3 mm upper pole right renal collecting system calculus. Musculoskeletal: Mid and lower thoracic spondylosis. Review of the MIP images confirms the above findings. IMPRESSION: 1. Moderate motion and patient position degradation. No pulmonary embolism with limitations above. 2. Small bilateral  pleural effusions. 3. Pulmonary artery enlargement suggests pulmonary arterial hypertension. 4. Aortic atherosclerosis (ICD10-I70.0), coronary artery atherosclerosis and emphysema (ICD10-J43.9). 5. Pulmonary nodules of maximally 7 mm. If the patient is at high risk for bronchogenic carcinoma, follow-up chest CT at 3-6 months is recommended. If the patient is at low risk for bronchogenic carcinoma, follow-up chest CT at 6-12 months is recommended. This recommendation follows the consensus statement: "Guidelines for Management of Small Pulmonary Nodules Detected on CT Scans: A Statement from the Fleischner Society" as published in Radiology 2005; 237:395-400. Online at: DietDisorder.cz. 6. Right nephrolithiasis Electronically Signed   By: Jeronimo Greaves M.D.   On: 10/23/2022 11:32    Cardiac Studies   2D echo 10/23/22   1. Left ventricular ejection fraction, by estimation, is 40 to 45%. Left  ventricular ejection fraction by 2D MOD biplane is 42.3 %. The left  ventricle has mildly decreased function. The left ventricle demonstrates  global hypokinesis. The left  ventricular internal cavity size was mildly dilated. Left ventricular  diastolic parameters are consistent with Grade II diastolic dysfunction  (pseudonormalization). Elevated left ventricular end-diastolic pressure.   2. Right ventricular systolic function is normal. The right ventricular  size is normal. There is severely elevated pulmonary artery systolic  pressure. The estimated right ventricular systolic pressure is 67.6 mmHg.   3. Left atrial size was moderately dilated.   4. Right atrial size was moderately dilated.   5. The mitral valve is abnormal. Mild mitral valve regurgitation.   6. The aortic valve is tricuspid. Aortic valve regurgitation is trivial.  Aortic valve sclerosis is present, with no evidence of aortic valve  stenosis.   7. Aortic dilatation noted. There is borderline dilatation  of the  ascending aorta, measuring 39 mm.   8. The inferior vena cava is normal in size with <50% respiratory  variability, suggesting right atrial pressure of 8 mmHg.   Comparison(s): Changes from prior study are noted. 07/02/2019: LVEF 60-65%.   Patient Profile     81 y.o. male with CAD with Taxus DES to ostial/prox LAD (stent hangs out into LM slightly) 2004 with subsequent hx of stent thrombosis with embolization down the LCx >> Lifelong DAPT, HTN, HLD, hypothyroidism, frequent symptomatic PVCs treated with amiodarone, RBBB+LAFB, CKD stage 3b (baseline Cr 1.6-2.1 recently) admitted to [facility] with recurrent chest pain,  elevated BP. Found to have slight bradycardia, hypokalemia, EF 40-45% by echo (down from 60-65% in 2020)  Assessment & Plan    1. CAD, chest pain, mild troponin elevation - hsTroponin peaked at 47 - currently tx with ASA, Plavix, rosuvastatin - not on BB with bradycardia - will discuss final plan for cath with MD  2. Acute HFrEF with severe pulmonary HTN - has received periodic Lasix dosing this admission, none ordered today in anticipation of R/LHC - no PE on CTA - further recs pending cath outcome - last dose lisinopril 11/13 in anticipation of transitioning to ARB (given 11/13) then Entresto - but given rising Cr and need for cath, we'll hold on further ARB plans - no BB with bradycardia  3. Bradycardia, with hx of frequent PVCs on amiodarone, known bifascicular block - on amiodarone daily except Sunday, continuing for now per MD - not on BB at this time - TSH wnl  4. CKD stage 3b - review of recent Cr shows values around 1.6-2.13, peaking in 04/2022, with last OP value 1.68 in 07/2022) - 1.19 mid admission seems to be outlier  - hold off any nephrotoxic agents at this time - received CT angio 10/23/22 so need to follow closely - Cr 1.78 today, slight uptrend from admission but not dramatically different than prior baseline  5. Elevated procalcitonin level of  19.80 - ? Unclear etiology - defer infectious w/u to primary team  6. Mild MR, borderline dilation of ascending aorta - to follow as OP  7. Mild anemia with macrocytosis - per medicine team  8. Pulmonary nodules - per report, "Pulmonary nodules of maximally 7 mm. If the patient is at high risk for bronchogenic carcinoma, follow-up chest CT at 3-6 months is recommended. If the patient is at low risk for bronchogenic carcinoma, follow-up chest CT at 6-12 months is recommended" - needs OP f/u for this  For questions or updates, please contact [facility] Please consult [email or web address] for contact info under Cardiology/STEMI.  Signed, Laurann Montanaayna N Dunn, PA-C 10/25/2022, 8:44 AM     Personally seen and examined. Agree with APP above with the following comments: Had CP this AM but has been around the unit with no sx Has had Creatinine from 1 to 2.3 but the highest was around kidney stones per patient Euvolemic on exam - elevated creatinine may be from CID; will restart cardiac diet and post for 10/26/22 case; tentatively 8:30 AM will be assessed by my PA early AM to look at kidney function - hold ARB and diuretics until after cath; this may be restarted as outpatient if post cath CIN - discussed radial access with patient - updated daugther Toniann FailWendy - rest as above  Riley LamMahesh Jacquelinne Speak, MD FASE [facility] Center[facility]ACC Cardiologist [facility]  [facility]  [address] [place], [place] [id number] [phone]  1:15 PM

## 2022-10-25 NOTE — Progress Notes (Signed)
PROGRESS NOTE    Thomas [name]  WLN:[id number] DOB: 04/26/1941 DOA: 10/23/2022 PCP: Kaleen Mask, MD   Brief Narrative:  HPI: Thomas [name] is a 81 y.o. male with medical history significant of CAD, hypertension, hyperlipidemia, hypothyroidism and many other comorbidities as listed below presented to ED with a complaint of shortness of breath.  According to patient, he initially had shortness of breath the night before which was brief but it resolved but then last night, he had it again.  Per him, he feels short of breath with laying flat but feels better with sitting up.  No cough, fever, chills, palpitation, chest pain, any problem with urination or with bowel movement.  No recent travel or sick contact.   ED Course: Upon arrival to ED, he was slightly bradycardic and hypertensive.  He was not hypoxic.  BNP was significantly elevated but chest x-ray showed diffuse peribronchial cuffing and interstitial prominence concerning for acute bronchitis.  He was also found to have hypokalemia.  Patient has not been given any replacement for potassium or any Lasix.  Hospital service was consulted for CHF exacerbation admission.  Assessment & Plan:   Principal Problem:   CHF (congestive heart failure) (HCC) Active Problems:   CAD (coronary artery disease)   HTN (hypertension)   Hyperlipidemia   Hypokalemia   Hypomagnesemia   Acute combined systolic and diastolic congestive heart failure (HCC)  Dyspnea/acute combined systolic and diastolic congestive heart failure: Chest x-ray does not show any pulmonary edema but he did have crackles and had significantly elevated BNP suggestive of possible new CHF.  Last echo was done 3 years ago which was almost normal.  Echo this admission shows 40 to 45% ejection fraction with grade 2 diastolic dysfunction.  This appears to be acute.  Patient received 1 dose of IV Lasix 40 mg on day of admission and yesterday, further doses has been held by  cardiology in anticipation of cardiac cath.  Will defer to cardiology now that they are consulted.  They are also planning to do cardiac cath once his creatinine improves.    Hypokalemia: Resolved.   CAD s/p PTCA and stent in LAD: Currently asymptomatic.  Continue aspirin and Plavix.   History of bigeminy and bradycardia: Continue amiodarone per cardiology.   Essential hypertension: Stable.  Lisinopril discontinued in anticipation of starting him on Entresto.  Cardiology managing.   Acquired hypothyroidism: Continue Synthroid.   Hyperlipidemia: Continue Crestor.   BPH: Continue Flomax.  DVT prophylaxis: enoxaparin (LOVENOX) injection 40 mg Start: 10/23/22 1800   Code Status: Full Code  Family Communication: none present at bedside.  Plan of care discussed with patient in length and he/she verbalized understanding and agreed with it.  Status is: Inpatient Remains inpatient appropriate because: Patient pending cardiac cath.     Estimated body mass index is 24.1 kg/m as calculated from the following:   Height as of this encounter: 6' (1.829 m).   Weight as of this encounter: 80.6 kg.    Nutritional Assessment: Body mass index is 24.1 kg/m.[name] Seen by dietician.  I agree with the assessment and plan as outlined below: Nutrition Status:        . Skin Assessment: I have examined the patient's skin and I agree with the wound assessment as performed by the wound care RN as outlined below:    Consultants:  Cardiology  Procedures:  None  Antimicrobials:  Anti-infectives (From admission, onward)    None  Subjective:  Patient seen and examined.  Still complains of orthopnea but overall improved.  No new complaints.  Objective: Vitals:   10/24/22 1651 10/24/22 2029 10/25/22 0500 10/25/22 0507  BP:  134/64  131/82  Pulse:  (!) 52  (!) 50  Resp:  18  16  Temp:  98 F (36.7 C)  98 F (36.7 C)  TempSrc:  Oral  Oral  SpO2:  97%  95%  Weight: 80.3 kg   80.6 kg   Height:        Intake/Output Summary (Last 24 hours) at 10/25/2022 1123 Last data filed at 10/25/2022 0214 Gross per 24 hour  Intake 360 ml  Output 550 ml  Net -190 ml    Filed Weights   10/24/22 0500 10/24/22 1651 10/25/22 0500  Weight: 79.4 kg 80.3 kg 80.6 kg    Examination:  General exam: Appears calm and comfortable  Respiratory system: Clear to auscultation. Respiratory effort normal. Cardiovascular system: S1 & S2 heard, RRR. No JVD, murmurs, rubs, gallops or clicks. No pedal edema. Gastrointestinal system: Abdomen is nondistended, soft and nontender. No organomegaly or masses felt. Normal bowel sounds heard. Central nervous system: Alert and oriented. No focal neurological deficits. Extremities: Symmetric 5 x 5 power. Skin: No rashes, lesions or ulcers.  Psychiatry: Judgement and insight appear normal. Mood & affect appropriate.   Data Reviewed: I have personally reviewed following labs and imaging studies  CBC: Recent Labs  Lab 10/23/22 0830 10/23/22 1321 10/24/22 0403  WBC 5.8 5.6 5.6  HGB 12.8* 12.2* 12.5*  HCT 39.9 38.5* 38.6*  MCV 108.4* 109.4* 107.8*  PLT 213 204 [id number]    Basic Metabolic Panel: Recent Labs  Lab 10/23/22 0955 10/23/22 1321 10/24/22 0403 10/24/22 1428 10/25/22 0343  NA 142  --  140  --  138  K 3.1*  --  3.3*  --  3.9  CL 107  --  105  --  105  CO2 26  --  25  --  27  GLUCOSE 98  --  97  --  104*  BUN 15  --  20  --  28*  CREATININE 1.41* 1.19 1.68*  --  1.78*  CALCIUM 8.4*  --  8.6*  --  9.1  MG  --  1.2*  --  2.4  --     GFR: Estimated Creatinine Clearance: 36.3 mL/min (A) (by C-G formula based on SCr of 1.78 mg/dL (H)). Liver Function Tests: Recent Labs  Lab 10/24/22 0403  AST 15  ALT 14  ALKPHOS 55  BILITOT 1.6*  PROT 6.5  ALBUMIN 3.6    No results for input(s): "LIPASE", "AMYLASE" in the last 168 hours. No results for input(s): "AMMONIA" in the last 168 hours. Coagulation Profile: No results for  input(s): "INR", "PROTIME" in the last 168 hours. Cardiac Enzymes: No results for input(s): "CKTOTAL", "CKMB", "CKMBINDEX", "TROPONINI" in the last 168 hours. BNP (last 3 results) No results for input(s): "PROBNP" in the last 8760 hours. HbA1C: No results for input(s): "HGBA1C" in the last 72 hours. CBG: Recent Labs  Lab 10/24/22 0744  GLUCAP 87    Lipid Profile: No results for input(s): "CHOL", "HDL", "LDLCALC", "TRIG", "CHOLHDL", "LDLDIRECT" in the last 72 hours. Thyroid Function Tests: Recent Labs    10/23/22 1321  TSH 2.203    Anemia Panel: No results for input(s): "VITAMINB12", "FOLATE", "FERRITIN", "TIBC", "IRON", "RETICCTPCT" in the last 72 hours. Sepsis Labs: Recent Labs  Lab 10/23/22 1321  PROCALCITON 19.80  Recent Results (from the past 240 hour(s))  SARS Coronavirus 2 by RT PCR (hospital order, performed in [facility] hospital lab) *cepheid single result test* Anterior Nasal Swab     Status: None   Collection Time: 10/23/22  1:21 PM   Specimen: Anterior Nasal Swab  Result Value Ref Range Status   SARS Coronavirus 2 by RT PCR NEGATIVE NEGATIVE Final    Comment: (NOTE) SARS-CoV-2 target nucleic acids are NOT DETECTED.  The SARS-CoV-2 RNA is generally detectable in upper and lower respiratory specimens during the acute phase of infection. The lowest concentration of SARS-CoV-2 viral copies this assay can detect is 250 copies / mL. A negative result does not preclude SARS-CoV-2 infection and should not be used as the sole basis for treatment or other patient management decisions.  A negative result may occur with improper specimen collection / handling, submission of specimen other than nasopharyngeal swab, presence of viral mutation(s) within the areas targeted by this assay, and inadequate number of viral copies (<250 copies / mL). A negative result must be combined with clinical observations, patient history, and epidemiological information.  Fact  Sheet for Patients:   https://www.patel.info/  Fact Sheet for Healthcare Providers: https://hall.com/  This test is not yet approved or  cleared by the [place] FDA and has been authorized for detection and/or diagnosis of SARS-CoV-2 by FDA under an Emergency Use Authorization (EUA).  This EUA will remain in effect (meaning this test can be used) for the duration of the COVID-19 declaration under Section 564(b)(1) of the Act, 21 U.S.C. section 360bbb-3(b)(1), unless the authorization is terminated or revoked sooner.  Performed at [facility], [place] [address], [place], [place] [address]   Respiratory (~20 pathogens) panel by PCR     Status: None   Collection Time: 10/23/22  1:21 PM   Specimen: Anterior Nasal Swab; Respiratory  Result Value Ref Range Status   Adenovirus NOT DETECTED NOT DETECTED Final   Coronavirus 229E NOT DETECTED NOT DETECTED Final    Comment: (NOTE) The Coronavirus on the Respiratory Panel, DOES NOT test for the novel  Coronavirus (2019 nCoV)    Coronavirus HKU1 NOT DETECTED NOT DETECTED Final   Coronavirus NL63 NOT DETECTED NOT DETECTED Final   Coronavirus OC43 NOT DETECTED NOT DETECTED Final   Metapneumovirus NOT DETECTED NOT DETECTED Final   Rhinovirus / Enterovirus NOT DETECTED NOT DETECTED Final   Influenza A NOT DETECTED NOT DETECTED Final   Influenza B NOT DETECTED NOT DETECTED Final   Parainfluenza Virus 1 NOT DETECTED NOT DETECTED Final   Parainfluenza Virus 2 NOT DETECTED NOT DETECTED Final   Parainfluenza Virus 3 NOT DETECTED NOT DETECTED Final   Parainfluenza Virus 4 NOT DETECTED NOT DETECTED Final   Respiratory Syncytial Virus NOT DETECTED NOT DETECTED Final   Bordetella pertussis NOT DETECTED NOT DETECTED Final   Bordetella Parapertussis NOT DETECTED NOT DETECTED Final   Chlamydophila pneumoniae NOT DETECTED NOT DETECTED Final   Mycoplasma pneumoniae NOT DETECTED NOT DETECTED  Final    Comment: Performed at [facility], [address], [place],  [address]     Radiology Studies: ECHOCARDIOGRAM COMPLETE  Result Date: 10/23/2022    ECHOCARDIOGRAM REPORT   Patient Name:   Thomas [name] Date of Exam: 10/23/2022 Medical Rec #:  [id number]        Height:       72.0 in Accession #:    [id number]       Weight:  189.0 lb Date of Birth:  19-Aug-1941        BSA:          2.080 m Patient Age:    27 years         BP:           151/73 mmHg Patient Gender: M                HR:           60 bpm. Exam Location:  Inpatient Procedure: 2D Echo, Cardiac Doppler and Color Doppler Indications:    CHF - acute diastolic  History:        Patient has prior history of Echocardiogram examinations, most                 recent 07/02/2019. CAD, Signs/Symptoms:Shortness of Breath; Risk                 Factors:Hypertension, Dyslipidemia and Family History of                 Coronary Artery Disease.  Sonographer:    Eartha Inch Referring Phys: [id number] Nehemiah Mcfarren  Sonographer Comments: Image acquisition challenging due to patient body habitus and Image acquisition challenging due to respiratory motion. IMPRESSIONS  1. Left ventricular ejection fraction, by estimation, is 40 to 45%. Left ventricular ejection fraction by 2D MOD biplane is 42.3 %. The left ventricle has mildly decreased function. The left ventricle demonstrates global hypokinesis. The left ventricular internal cavity size was mildly dilated. Left ventricular diastolic parameters are consistent with Grade II diastolic dysfunction (pseudonormalization). Elevated left ventricular end-diastolic pressure.  2. Right ventricular systolic function is normal. The right ventricular size is normal. There is severely elevated pulmonary artery systolic pressure. The estimated right ventricular systolic pressure is [id number] mmHg.  3. Left atrial size was moderately dilated.  4. Right atrial size was moderately dilated.  5. The mitral valve is  abnormal. Mild mitral valve regurgitation.  6. The aortic valve is tricuspid. Aortic valve regurgitation is trivial. Aortic valve sclerosis is present, with no evidence of aortic valve stenosis.  7. Aortic dilatation noted. There is borderline dilatation of the ascending aorta, measuring 39 mm.  8. The inferior vena cava is normal in size with <50% respiratory variability, suggesting right atrial pressure of 8 mmHg. Comparison(s): Changes from prior study are noted. 07/02/2019: LVEF 60-65%. FINDINGS  Left Ventricle: Left ventricular ejection fraction, by estimation, is 40 to 45%. Left ventricular ejection fraction by 2D MOD biplane is 42.3 %. The left ventricle has mildly decreased function. The left ventricle demonstrates global hypokinesis. The left ventricular internal cavity size was mildly dilated. There is no left ventricular hypertrophy. Left ventricular diastolic parameters are consistent with Grade II diastolic dysfunction (pseudonormalization). Elevated left ventricular end-diastolic pressure. Right Ventricle: The right ventricular size is normal. No increase in right ventricular wall thickness. Right ventricular systolic function is normal. There is severely elevated pulmonary artery systolic pressure. The tricuspid regurgitant velocity is 3.86 m/s, and with an assumed right atrial pressure of 8 mmHg, the estimated right ventricular systolic pressure is [id number] mmHg. Left Atrium: Left atrial size was moderately dilated. Right Atrium: Right atrial size was moderately dilated. Pericardium: Trivial pericardial effusion is present. The pericardial effusion is posterior to the left ventricle. Mitral Valve: The mitral valve is abnormal. There is mild thickening of the anterior and posterior mitral valve leaflet(s). Mild mitral valve regurgitation, with posteriorly-directed jet. Tricuspid Valve: The tricuspid valve is grossly normal.  Tricuspid valve regurgitation is mild. Aortic Valve: The aortic valve is tricuspid.  Aortic valve regurgitation is trivial. Aortic valve sclerosis is present, with no evidence of aortic valve stenosis. Pulmonic Valve: The pulmonic valve was grossly normal. Pulmonic valve regurgitation is trivial. Aorta: Aortic dilatation noted. There is borderline dilatation of the ascending aorta, measuring 39 mm. Venous: The inferior vena cava is normal in size with less than 50% respiratory variability, suggesting right atrial pressure of 8 mmHg. IAS/Shunts: No atrial level shunt detected by color flow Doppler.  LEFT VENTRICLE PLAX 2D                        Biplane EF (MOD) LVIDd:         5.80 cm         LV Biplane EF:   Left LVIDs:         4.30 cm                          ventricular LV PW:         0.90 cm                          ejection LV IVS:        0.90 cm                          fraction by LVOT diam:     2.00 cm                          2D MOD LV SV:         49                               biplane is LV SV Index:   24                               42.3 %. LVOT Area:     3.14 cm                                Diastology                                LV e' medial:    3.37 cm/s LV Volumes (MOD)               LV E/e' medial:  20.2 LV vol d, MOD    139.0 ml      LV e' lateral:   9.89 cm/s A2C:                           LV E/e' lateral: 6.9 LV vol d, MOD    131.0 ml A4C: LV vol s, MOD    78.2 ml A2C: LV vol s, MOD    78.1 ml A4C: LV SV MOD A2C:   60.8 ml LV SV MOD A4C:   131.0 ml LV SV MOD BP:    57.5 ml RIGHT VENTRICLE RV S prime:     14.40 cm/s TAPSE (M-mode): 2.4 cm LEFT ATRIUM  Index        RIGHT ATRIUM           Index LA diam:        5.20 cm  2.50 cm/m   RA Area:     26.70 cm LA Vol (A2C):   108.0 ml 51.92 ml/m  RA Volume:   97.90 ml  47.07 ml/m LA Vol (A4C):   81.9 ml  39.38 ml/m LA Biplane Vol: 94.2 ml  45.29 ml/m  AORTIC VALVE LVOT Vmax:   88.30 cm/s LVOT Vmean:  55.100 cm/s LVOT VTI:    0.157 m  AORTA Ao Root diam: 3.30 cm Ao Asc diam:  3.90 cm MITRAL VALVE                TRICUSPID VALVE MV Area (PHT): 3.46 cm    TR Peak grad:   59.6 mmHg MV Decel Time: 219 msec    TR Mean grad:   33.0 mmHg MR Peak grad: 87.8 mmHg    TR Vmax:        386.00 cm/s MR Vmax:      468.50 cm/s  TR Vmean:       268.0 cm/s MV E velocity: 68.20 cm/s MV A velocity: 38.40 cm/s  SHUNTS MV E/A ratio:  1.78        Systemic VTI:  0.16 m                            Systemic Diam: 2.00 cm Lyman Bishop MD Electronically signed by Lyman Bishop MD Signature Date/Time: 10/23/2022/4:05:44 PM    Final     Scheduled Meds:  amiodarone  200 mg Oral Once per day on Mon Tue Wed Thu Fri Sat   aspirin EC  81 mg Oral Daily   clopidogrel  75 mg Oral Q breakfast   enoxaparin (LOVENOX) injection  40 mg Subcutaneous Q24H   levothyroxine  150 mcg Oral Daily   rosuvastatin  10 mg Oral Daily   sodium chloride flush  3 mL Intravenous Q12H   Continuous Infusions:  sodium chloride     sodium chloride 10 mL/hr at 10/25/22 [id number]     LOS: 0 days   Darliss Cheney, MD [facility]  10/25/2022, 11:23 AM   *Please note that this is a verbal dictation therefore any spelling or grammatical errors are due to the "[employer]" system interpretation.  Please page via [place] and do not message via secure chat for urgent patient care matters. Secure chat can be used for non urgent patient care matters.  How to contact the [facility] Attending or Consulting provider Arnold Line or covering provider during after hours [place], for this patient?  Check the care team in [facility] and look for a) attending/consulting [facility] provider listed and b) the [facility] team listed. Page or secure chat [phone]. Log into [email or web address] and use [place]'s universal password to access. If you do not have the password, please contact the hospital operator. Locate the [facility] provider you are looking for under [facility] and page to a number that you can be directly reached. If you still have difficulty reaching the provider, please page the [facility] (Director  on Call) for the Hospitalists listed on amion for assistance.

## 2022-10-26 ENCOUNTER — Encounter (HOSPITAL_COMMUNITY)
Admission: EM | Disposition: A | Discharge status: Home / Self Care | Payer: Self-pay | Source: Home / Self Care | Attending: Family Medicine

## 2022-10-26 ENCOUNTER — Other Ambulatory Visit: Payer: Self-pay | Admitting: Family Medicine

## 2022-10-26 ENCOUNTER — Telehealth: Payer: Self-pay | Admitting: Physician Assistant

## 2022-10-26 DIAGNOSIS — N1832 Chronic kidney disease, stage 3b: Secondary | ICD-10-CM | POA: Insufficient documentation

## 2022-10-26 DIAGNOSIS — I2510 Atherosclerotic heart disease of native coronary artery without angina pectoris: Secondary | ICD-10-CM

## 2022-10-26 DIAGNOSIS — R7989 Other specified abnormal findings of blood chemistry: Secondary | ICD-10-CM | POA: Insufficient documentation

## 2022-10-26 DIAGNOSIS — I25118 Atherosclerotic heart disease of native coronary artery with other forms of angina pectoris: Secondary | ICD-10-CM

## 2022-10-26 DIAGNOSIS — N400 Enlarged prostate without lower urinary tract symptoms: Secondary | ICD-10-CM | POA: Insufficient documentation

## 2022-10-26 DIAGNOSIS — I10 Essential (primary) hypertension: Secondary | ICD-10-CM

## 2022-10-26 DIAGNOSIS — R918 Other nonspecific abnormal finding of lung field: Secondary | ICD-10-CM | POA: Insufficient documentation

## 2022-10-26 DIAGNOSIS — E782 Mixed hyperlipidemia: Secondary | ICD-10-CM

## 2022-10-26 DIAGNOSIS — N183 Chronic kidney disease, stage 3 unspecified: Secondary | ICD-10-CM

## 2022-10-26 DIAGNOSIS — E039 Hypothyroidism, unspecified: Secondary | ICD-10-CM | POA: Insufficient documentation

## 2022-10-26 HISTORY — PX: RIGHT/LEFT HEART CATH AND CORONARY ANGIOGRAPHY: CATH118266

## 2022-10-26 HISTORY — DX: Chronic kidney disease, stage 3 unspecified: N18.30

## 2022-10-26 LAB — BASIC METABOLIC PANEL
Anion gap: 7 (ref 5–15)
BUN: 32 mg/dL — ABNORMAL HIGH (ref 8–23)
CO2: 23 mmol/L (ref 22–32)
Calcium: 8.8 mg/dL — ABNORMAL LOW (ref 8.9–10.3)
Chloride: 109 mmol/L (ref 98–111)
Creatinine, Ser: 1.76 mg/dL — ABNORMAL HIGH (ref 0.61–1.24)
GFR, Estimated: 39 mL/min — ABNORMAL LOW (ref >60)
Glucose, Bld: 95 mg/dL (ref 70–99)
Potassium: 3.7 mmol/L (ref 3.5–5.1)
Sodium: 139 mmol/L (ref 135–145)

## 2022-10-26 LAB — LIPID PANEL
Cholesterol: 130 mg/dL (ref 0–200)
HDL: 35 mg/dL — ABNORMAL LOW (ref >40)
LDL Cholesterol: 80 mg/dL (ref 0–99)
Total CHOL/HDL Ratio: 3.7 RATIO
Triglycerides: 77 mg/dL (ref <150)
VLDL: 15 mg/dL (ref 0–40)

## 2022-10-26 LAB — CBC
HCT: 38.6 % — ABNORMAL LOW (ref 39.0–52.0)
Hemoglobin: 12.6 g/dL — ABNORMAL LOW (ref 13.0–17.0)
MCH: 35 pg — ABNORMAL HIGH (ref 26.0–34.0)
MCHC: 32.6 g/dL (ref 30.0–36.0)
MCV: 107.2 fL — ABNORMAL HIGH (ref 80.0–100.0)
Platelets: 231 10*3/uL (ref 150–400)
RBC: 3.6 MIL/uL — ABNORMAL LOW (ref 4.22–5.81)
RDW: 14.8 % (ref 11.5–15.5)
WBC: 5.6 10*3/uL (ref 4.0–10.5)
nRBC: 0 % (ref 0.0–0.2)

## 2022-10-26 SURGERY — RIGHT/LEFT HEART CATH AND CORONARY ANGIOGRAPHY
Anesthesia: LOCAL

## 2022-10-26 MED ORDER — VERAPAMIL HCL 2.5 MG/ML IV SOLN
INTRAVENOUS | Status: AC
  Filled 2022-10-26: qty 2

## 2022-10-26 MED ORDER — SODIUM CHLORIDE 0.9 % IV SOLN: INTRAVENOUS | Status: DC

## 2022-10-26 MED ORDER — SODIUM CHLORIDE 0.9% FLUSH: 3.0000 mL | INTRAVENOUS | Status: DC | PRN

## 2022-10-26 MED ORDER — HEPARIN SODIUM (PORCINE) 1000 UNIT/ML IJ SOLN
INTRAMUSCULAR | Status: AC
  Filled 2022-10-26: qty 10

## 2022-10-26 MED ORDER — LABETALOL HCL 5 MG/ML IV SOLN: 10.0000 mg | INTRAVENOUS | Status: AC | PRN

## 2022-10-26 MED ORDER — HEPARIN (PORCINE) IN NACL 1000-0.9 UT/500ML-% IV SOLN
INTRAVENOUS | Status: AC
  Filled 2022-10-26: qty 1000

## 2022-10-26 MED ORDER — SODIUM CHLORIDE 0.9 % IV SOLN: 250.0000 mL | INTRAVENOUS | Status: DC | PRN

## 2022-10-26 MED ORDER — SODIUM CHLORIDE 0.9% FLUSH: 3.0000 mL | Freq: Two times a day (BID) | INTRAVENOUS | Status: DC

## 2022-10-26 MED ORDER — FUROSEMIDE 20 MG PO TABS: 20.0000 mg | ORAL_TABLET | Freq: Every day | ORAL | 0 refills | Status: DC | PRN

## 2022-10-26 MED ORDER — LIDOCAINE HCL (PF) 1 % IJ SOLN
INTRAMUSCULAR | Status: AC
  Filled 2022-10-26: qty 30

## 2022-10-26 MED ORDER — LIDOCAINE HCL (PF) 1 % IJ SOLN
INTRAMUSCULAR | Status: DC | PRN
  Administered 2022-10-26 (×2): 2 mL

## 2022-10-26 MED ORDER — HEPARIN (PORCINE) IN NACL 1000-0.9 UT/500ML-% IV SOLN
INTRAVENOUS | Status: DC | PRN
  Administered 2022-10-26 (×2): 500 mL

## 2022-10-26 MED ORDER — LOSARTAN POTASSIUM 25 MG PO TABS: 25.0000 mg | ORAL_TABLET | Freq: Every day | ORAL | 0 refills | Status: DC

## 2022-10-26 MED ORDER — FENTANYL CITRATE (PF) 100 MCG/2ML IJ SOLN
INTRAMUSCULAR | Status: AC
  Filled 2022-10-26: qty 2

## 2022-10-26 MED ORDER — LOSARTAN POTASSIUM 25 MG PO TABS
25.0000 mg | ORAL_TABLET | Freq: Every day | ORAL | Status: DC
  Administered 2022-10-26: 25 mg via ORAL
  Filled 2022-10-26: qty 1

## 2022-10-26 MED ORDER — HEPARIN SODIUM (PORCINE) 5000 UNIT/ML IJ SOLN: 5000.0000 [IU] | Freq: Three times a day (TID) | INTRAMUSCULAR | Status: DC

## 2022-10-26 MED ORDER — HYDRALAZINE HCL 20 MG/ML IJ SOLN
10.0000 mg | INTRAMUSCULAR | Status: AC | PRN
  Administered 2022-10-26: 10 mg via INTRAVENOUS

## 2022-10-26 MED ORDER — ASPIRIN 81 MG PO CHEW
81.0000 mg | CHEWABLE_TABLET | ORAL | Status: AC
  Administered 2022-10-26: 81 mg via ORAL
  Filled 2022-10-26: qty 1

## 2022-10-26 MED ORDER — FENTANYL CITRATE (PF) 100 MCG/2ML IJ SOLN
INTRAMUSCULAR | Status: DC | PRN
  Administered 2022-10-26: 25 ug via INTRAVENOUS

## 2022-10-26 MED ORDER — SODIUM CHLORIDE 0.9 % WEIGHT BASED INFUSION
1.0000 mL/kg/h | INTRAVENOUS | Status: AC
  Administered 2022-10-26: 1 mL/kg/h via INTRAVENOUS

## 2022-10-26 MED ORDER — HEPARIN SODIUM (PORCINE) 1000 UNIT/ML IJ SOLN
INTRAMUSCULAR | Status: DC | PRN
  Administered 2022-10-26: 4000 [IU] via INTRAVENOUS

## 2022-10-26 MED ORDER — ONDANSETRON HCL 4 MG/2ML IJ SOLN: 4.0000 mg | Freq: Four times a day (QID) | INTRAMUSCULAR | Status: DC | PRN

## 2022-10-26 MED ORDER — VERAPAMIL HCL 2.5 MG/ML IV SOLN
INTRAVENOUS | Status: DC | PRN
  Administered 2022-10-26: 10 mL via INTRA_ARTERIAL

## 2022-10-26 MED ORDER — HYDRALAZINE HCL 20 MG/ML IJ SOLN
INTRAMUSCULAR | Status: AC
  Filled 2022-10-26: qty 1

## 2022-10-26 MED ORDER — MIDAZOLAM HCL 2 MG/2ML IJ SOLN
INTRAMUSCULAR | Status: AC
  Filled 2022-10-26: qty 2

## 2022-10-26 MED ORDER — IOHEXOL 350 MG/ML SOLN
INTRAVENOUS | Status: DC | PRN
  Administered 2022-10-26: 55 mL

## 2022-10-26 MED ORDER — MIDAZOLAM HCL 2 MG/2ML IJ SOLN
INTRAMUSCULAR | Status: DC | PRN
  Administered 2022-10-26: 1 mg via INTRAVENOUS

## 2022-10-26 SURGICAL SUPPLY — 17 items
BAND CMPR LRG ZPHR (HEMOSTASIS) ×1
BAND ZEPHYR COMPRESS 30 LONG (HEMOSTASIS) IMPLANT
CATH INFINITI 5 FR AR1 MOD (CATHETERS) IMPLANT
CATH INFINITI JR4 5F (CATHETERS) IMPLANT
CATH OPTITORQUE TIG 4.0 5F (CATHETERS) IMPLANT
CATH SWAN GANZ 7F STRAIGHT (CATHETERS) IMPLANT
GLIDESHEATH SLEND SS 6F .021 (SHEATH) IMPLANT
GLIDESHEATH SLENDER 7FR .021G (SHEATH) IMPLANT
GUIDEWIRE INQWIRE 1.5J.035X260 (WIRE) IMPLANT
INQWIRE 1.5J .035X260CM (WIRE) ×1
KIT HEART LEFT (KITS) ×2 IMPLANT
PACK CARDIAC CATHETERIZATION (CUSTOM PROCEDURE TRAY) ×2 IMPLANT
SHEATH PROBE COVER 6X72 (BAG) IMPLANT
TRANSDUCER W/STOPCOCK (MISCELLANEOUS) ×2 IMPLANT
TUBING CIL FLEX 10 FLL-RA (TUBING) ×2 IMPLANT
WIRE EMERALD 3MM-J .025X260CM (WIRE) IMPLANT
WIRE HI TORQ VERSACORE-J 145CM (WIRE) IMPLANT

## 2022-10-26 NOTE — Assessment & Plan Note (Signed)
Also has history bradycardia, frequent PVCs and known bifascicular block

## 2022-10-26 NOTE — Brief Op Note (Signed)
BRIEF CARDIAC CATHETERIZATION REPORT  PATIENT: Thomas [name]   MRN: [id number] 10/26/2022                                                     10:19 AM  PRIMARY CARE PROVIDER: Leonard Downing, MD PRIMARY CARDIOLOGIST: Mertie Moores, MD   [facility] ATTENDING: Velora Mediate, MD  REFERRING CARDIOLOGIST: Dr. Gasper Sells  PROCEDURE:  Procedure(s): RIGHT/LEFT HEART CATH AND CORONARY ANGIOGRAPHY (N/A)  SURGEON:  Surgeon(s) and Role:    * Leonie Man, MD - Primary  PATIENT:  Thomas [name] is a 81 y.o. 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 Found to have slight bradycardia, hypokalemia, EF 40-45% by echo (down from 60-65% in 2020).  Based on the drop in EF and unusual symptoms he is referred for invasive eluate with cardiac catheterization and possible PCI.  With CKD 3B-creatinine 1.78, plan would be staged PCI of abnormal findings noted.  PRE-OPERATIVE DIAGNOSIS:  chest pain with reduced EF  POST-OPERATIVE DIAGNOSIS:  * No post-op diagnosis entered * Angiographically stable coronary artery disease with widely patent ostial LAD overlapping stents with no obstruction of flow in the LCx. Downward takeoff RCA (used AR-1 catheter) widely patent with PDA and PL 1 Right heart cath numbers essentially normal: RAP 4 mmHg 3, RVP 39/1-1 mmHg.  PAP-mean 33/14-18 mmHg.  PCWP 16/14-12 mmHg; LV P- EDP 157/5-12 mmHg; AOP-MAP 151/57-88 mmHg Ao sat 97% PA sat 70%. Somewhat conflicting cardiac Output-Index: Fick 6.34-3.12; thermal 4.17-2.05 (consider possibility of valvular disease.  PROCEDURE PERFORMED Time Out: Verified patient identification, verified procedure, site/side was  marked, verified correct patient position, special equipment/implants available, medications/allergies/relevent history reviewed, required imaging and test results available. Performed.  Access:  RIGHT Radial Artery: 6 Fr sheath -- Seldinger technique using Micropuncture Kit Direct ultrasound guidance used.  Permanent image obtained and placed on chart. 10 mL radial cocktail IA; 4000 units IV Heparin  * Right Brachial/Antecubital Vein: The existing 18-gauge IV was exchanged over a wire for a 7Fr  sheath  Right Heart Catheterization: 7 Fr Swan Ganz catheter advanced under fluoroscopy with balloon inflated to the RA, RV, then PCWP-PA for hemodynamic measurement.  * Simultaneous FA & PA blood gases checked for SaO2% to calculate FICK CO/CI  * Catheter removed completely out of the body with balloon deflated.  Left Heart Catheterization: 5 Fr Catheters advanced or exchanged over a J-wire under direct fluoroscopic guidance into the ascending aorta; TIG 4.0 catheter advanced first.  * LV Hemodynamics (LV Gram): TIG 4.0 catheter * Left Coronary Artery Cineangiography: TIG 4.0 catheter  * Right Coronary Artery Cineangiography: Unable to engage successfully TIG 4.0 and JR4.  Used AR-1 catheter   Upon completion of Angiogaphy, the catheter was removed completely out of the body over a wire, without complication.  Brachial Sheath(s) removed in the Cath Lab with manual pressure for hemostasis.    Radial sheath removed in the Cardiac Catheterization lab with Zephyr pneumatic band placed for hemostasis.  TR Band: 1000  Hours; 13 mL air  MEDICATIONS * SQ  Lidocaine 75m * Radial Cocktail: 3 mg Verapmil in 10 mL NS * Isovue Contrast: 45 * Heparin: 4000 units  ANESTHESIA:   local and IV sedation -> 3 mL subcu lidocaine 1 mg Versed, 25 mcg fentanyl  EBL:  <20 mL  COUNTS:  YES  DICTATION: .Note written in EPIC  PLAN OF CARE:  [facility] failure medications to Dr. CGasper Sells PATIENT DISPOSITION:  PACU - hemodynamically stable.   Delay start of Pharmacological VTE agent (>24hrs) due to surgical blood loss or risk of bleeding: N/A    DLeonie Man MD, MS DGlenetta Hew M.D., M.S. Interventional Cardiologist  [place] Pager # [phone] # [phone] [address] [place] [id number]

## 2022-10-26 NOTE — Discharge Summary (Signed)
Physician Discharge Summary   Patient: Thomas [name] MRN: [id number] DOB: 1941-12-03  Admit date:     10/23/2022  Discharge date: 10/26/22  Discharge Physician: Alberteen Sam   PCP: Kaleen Mask, MD     Recommendations at discharge:  Follow up with Cardiology Dr. Elease Hashimoto in 1 week Dr. Elease Hashimoto:  Please obtain BMP in 1 week Please start furosemide daily as indicated  Follow up with PCP Dr. Jeannetta Nap in 4 weeks Dr. Jeannetta Nap: Please obtain CT chest in 3 months to follow up lung nodules (see below)     Discharge Diagnoses: Principal Problem:   Acute combined systolic and diastolic congestive heart failure (HCC) Active Problems:   CAD (coronary artery disease)   Essential hypertension   Hyperlipidemia   Hypokalemia   Hypomagnesemia   PVC's (premature ventricular contractions)   BPH (benign prostatic hyperplasia)   Hypothyroidism, acquired   Stage 3b chronic kidney disease (CKD) (HCC)   Pulmonary nodules   Elevated procalcitonin      Hospital Course: Thomas [name] is an 81 y.o. M with CAD status post PCI years ago, HTN, HLD, and hypothyroidism who presented with paroxysmal nocturnal dyspnea.  Patient reports that several months ago, he started to have increasing dyspnea with exertion and stopped walking around the track [place]  his nearby [place] with his wife.    This got slowly worse until the day of admission, the patient woke unable to breathe, can only breathe with sitting up and drawing his legs over the side of the bed.  In the ER, chest x-ray showed bilateral opacities, CT angiogram of the chest showed bilateral pleural effusions when he was admitted on Lasix.      * Acute combined systolic and diastolic congestive heart failure [facility]) Patient admitted and given IV Lasix.  Echocardiogram obtained showed new EF 40 to 45%.  Cardiology consulted.  Patient's creatinine trended slightly up, and diuresis was held, although he was asymptomatic and able to walk  200 feet without dyspnea and had resolution of orthopnea.  Underwent cardiac cath that showed normal right heart pressures, pulmonary hypertension ruled out.  Cardiology recommended PRN Lasix at d/c, close BMP follow up and transition to Losartan with intention of Entresto initiation as an outpatient.  Cardiology follow up arranged prior to d/c.     Lung nodules Incidentally noted pulmonary nodules maximally 7 mm. Given age and history smoking 52 years, follow-up chest CT at 3-6 months is recommended.   Stage 3b chronic kidney disease (CKD) (HCC) AKI has been ruled out, CKD 3b only, baseline appears to be 1.6-1.7   PVC's (premature ventricular contractions) Also has history bradycardia, frequent PVCs and known bifascicular block   CAD (coronary artery disease) Ischemia ruled out.  Underwent cardiac cath that showed stable coronary disease, no significant stenosis, normal heart pressures.    Elevated procalcitonin Significance unclear.  Patient observed off antibiotics here.  Had no fever, leukocytosis, cough, sputum, or other signs of infection, localized or systemic.  Concerning in setting of cancer.           [facility] Controlled Substances Registry was reviewed for this patient prior to discharge.   Consultants: Cardiology Procedures performed:  - Echocardiogram - CTA chest - Left and right heart catheterization   Disposition: Home Diet recommendation:  Discharge Diet Orders (From admission, onward)     Start     Ordered   10/26/22 0000  Diet - low sodium heart healthy  10/26/22 1531             DISCHARGE MEDICATION: Allergies as of 10/26/2022       Reactions   Hydrocodone Nausea And Vomiting, Other (See Comments)   Dizziness and upset stomach   Robaxin [methocarbamol] Nausea And Vomiting   Roxicodone [oxycodone] Nausea And Vomiting   Ultram [tramadol] Nausea And Vomiting        Medication List     STOP taking these medications     lisinopril 10 MG tablet Commonly known as: ZESTRIL       TAKE these medications    acetaminophen 500 MG tablet Commonly known as: TYLENOL Take 500 mg by mouth daily as needed for moderate pain.   amiodarone 200 MG tablet Commonly known as: PACERONE TAKE 1 TABLET BY MOUTH DAILY EXCEPT SUNDAY. DO NOT TAKE ON SUNDAY What changed: See the new instructions.   aspirin EC 81 MG tablet Take 1 tablet (81 mg total) by mouth daily.   azelastine 0.1 % nasal spray Commonly known as: ASTELIN Place 1 spray into both nostrils daily as needed for rhinitis. Use in each nostril as directed   clopidogrel 75 MG tablet Commonly known as: PLAVIX Take 1 tablet (75 mg total) by mouth daily with breakfast.   levothyroxine 150 MCG tablet Commonly known as: SYNTHROID Take 150 mcg by mouth daily.   losartan 25 MG tablet Commonly known as: COZAAR Take 1 tablet (25 mg total) by mouth daily. Start taking on: October 27, 2022   rosuvastatin 10 MG tablet Commonly known as: CRESTOR Take 10 mg by mouth daily.        Follow-up Information     Nahser, Deloris Ping, MD Follow up.   Specialty: Cardiology Why: Humberto Seals - cardiology follow-up scheduled on Tuesday Nov 01, 2022 11:40 AM (Arrive by 11:25 AM). Contact information: [address] [place] [address] [phone]                 Discharge Instructions     Diet - low sodium heart healthy   Complete by: As directed    Discharge instructions   Complete by: As directed    **IMPORTANT DISCHARGE INSTRUCTIONS**   From Dr. Maryfrances Bunnell: You were admitted for trouble breathing. Here, we found that this was from a flare of congestive heart failure.  You were treated with diuretics and got better.  We did an ultrasound of the heart (an echocardiogram) that showed your heart squeeze was weak  You had a procedure called a "heart cath" or "heart catheterization" where they placed a sensor through the artery in  your right wrist to analyze the heart  This showed that you had not had a heart attack and had no large new blockages in your arteries and that the pressures in the heart were normal  It is not important to manage your heart failure going forward:  Start with: Furosemide *as needed* and losartan  STOP lisinopril START losartan (this is a heart protective medicine, similar to lisinopril)  Go see Dr. Elease Hashimoto in 1 week (see below in To Do section)  He will change the losartan to [facility] if he can  Take your aspirin, Plavix (to "grease" the platelets and prevent clotting in the heart) and Crestor (cholesterol medicines)  Go see your primary doctor in 4 weeks and ask him to repeat a CT scan of your chest based on our discharge summary  IMPORTANT: From now on: weigh yourself every day in the  morning, still in your pajamas or underwear, after you've gone to the bathroom for the morning but before breakfast  For the next week, record those weights each morning and show to Dr. Elease Hashimoto in a week   Increase activity slowly   Complete by: As directed        Discharge Exam: Filed Weights   10/24/22 1651 10/25/22 0500 10/26/22 0500  Weight: 80.3 kg 80.6 kg 80.7 kg    General: Pt is alert, awake, not in acute distress Cardiovascular: RRR, nl S1-S2, no murmurs appreciated.   No LE edema.   Respiratory: Normal respiratory rate and rhythm.  CTAB without rales or wheezes. Abdominal: Abdomen soft and non-tender.  No distension or HSM.   Neuro/Psych: Strength symmetric in upper and lower extremities.  Judgment and insight appear normal.   Condition at discharge: good  The results of significant diagnostics from this hospitalization (including imaging, microbiology, ancillary and laboratory) are listed below for reference.   Imaging Studies: ECHOCARDIOGRAM COMPLETE  Result Date: 10/23/2022    ECHOCARDIOGRAM REPORT   Patient Name:   CERRONE DEBOLD Date of Exam: 10/23/2022 Medical Rec #:   [id number]        Height:       72.0 in Accession #:    [id number]       Weight:       189.0 lb Date of Birth:  [birth year]        BSA:          2.080 m Patient Age:    81 years         BP:           151/73 mmHg Patient Gender: M                HR:           60 bpm. Exam Location:  Inpatient Procedure: 2D Echo, Cardiac Doppler and Color Doppler Indications:    CHF - acute diastolic  History:        Patient has prior history of Echocardiogram examinations, most                 recent 07/02/2019. CAD, Signs/Symptoms:Shortness of Breath; Risk                 Factors:Hypertension, Dyslipidemia and Family History of                 Coronary Artery Disease.  Sonographer:    Milda Smart Referring Phys: [id number] RAVI PAHWANI  Sonographer Comments: Image acquisition challenging due to patient body habitus and Image acquisition challenging due to respiratory motion. IMPRESSIONS  1. Left ventricular ejection fraction, by estimation, is 40 to 45%. Left ventricular ejection fraction by 2D MOD biplane is 42.3 %. The left ventricle has mildly decreased function. The left ventricle demonstrates global hypokinesis. The left ventricular internal cavity size was mildly dilated. Left ventricular diastolic parameters are consistent with Grade II diastolic dysfunction (pseudonormalization). Elevated left ventricular end-diastolic pressure.  2. Right ventricular systolic function is normal. The right ventricular size is normal. There is severely elevated pulmonary artery systolic pressure. The estimated right ventricular systolic pressure is 67.6 mmHg.  3. Left atrial size was moderately dilated.  4. Right atrial size was moderately dilated.  5. The mitral valve is abnormal. Mild mitral valve regurgitation.  6. The aortic valve is tricuspid. Aortic valve regurgitation is trivial. Aortic valve sclerosis is present, with no evidence of aortic valve stenosis.  7. Aortic dilatation  noted. There is borderline dilatation of the ascending aorta,  measuring 39 mm.  8. The inferior vena cava is normal in size with <50% respiratory variability, suggesting right atrial pressure of 8 mmHg. Comparison(s): Changes from prior study are noted. 07/02/2019: LVEF 60-65%. FINDINGS  Left Ventricle: Left ventricular ejection fraction, by estimation, is 40 to 45%. Left ventricular ejection fraction by 2D MOD biplane is 42.3 %. The left ventricle has mildly decreased function. The left ventricle demonstrates global hypokinesis. The left ventricular internal cavity size was mildly dilated. There is no left ventricular hypertrophy. Left ventricular diastolic parameters are consistent with Grade II diastolic dysfunction (pseudonormalization). Elevated left ventricular end-diastolic pressure. Right Ventricle: The right ventricular size is normal. No increase in right ventricular wall thickness. Right ventricular systolic function is normal. There is severely elevated pulmonary artery systolic pressure. The tricuspid regurgitant velocity is 3.86 m/s, and with an assumed right atrial pressure of 8 mmHg, the estimated right ventricular systolic pressure is 67.6 mmHg. Left Atrium: Left atrial size was moderately dilated. Right Atrium: Right atrial size was moderately dilated. Pericardium: Trivial pericardial effusion is present. The pericardial effusion is posterior to the left ventricle. Mitral Valve: The mitral valve is abnormal. There is mild thickening of the anterior and posterior mitral valve leaflet(s). Mild mitral valve regurgitation, with posteriorly-directed jet. Tricuspid Valve: The tricuspid valve is grossly normal. Tricuspid valve regurgitation is mild. Aortic Valve: The aortic valve is tricuspid. Aortic valve regurgitation is trivial. Aortic valve sclerosis is present, with no evidence of aortic valve stenosis. Pulmonic Valve: The pulmonic valve was grossly normal. Pulmonic valve regurgitation is trivial. Aorta: Aortic dilatation noted. There is borderline dilatation of  the ascending aorta, measuring 39 mm. Venous: The inferior vena cava is normal in size with less than 50% respiratory variability, suggesting right atrial pressure of 8 mmHg. IAS/Shunts: No atrial level shunt detected by color flow Doppler.  LEFT VENTRICLE PLAX 2D                        Biplane EF (MOD) LVIDd:         5.80 cm         LV Biplane EF:   Left LVIDs:         4.30 cm                          ventricular LV PW:         0.90 cm                          ejection LV IVS:        0.90 cm                          fraction by LVOT diam:     2.00 cm                          2D MOD LV SV:         49                               biplane is LV SV Index:   24  42.3 %. LVOT Area:     3.14 cm                                Diastology                                LV e' medial:    3.37 cm/s LV Volumes (MOD)               LV E/e' medial:  20.2 LV vol d, MOD    139.0 ml      LV e' lateral:   9.89 cm/s A2C:                           LV E/e' lateral: 6.9 LV vol d, MOD    131.0 ml A4C: LV vol s, MOD    78.2 ml A2C: LV vol s, MOD    78.1 ml A4C: LV SV MOD A2C:   60.8 ml LV SV MOD A4C:   131.0 ml LV SV MOD BP:    57.5 ml RIGHT VENTRICLE RV S prime:     14.40 cm/s TAPSE (M-mode): 2.4 cm LEFT ATRIUM              Index        RIGHT ATRIUM           Index LA diam:        5.20 cm  2.50 cm/m   RA Area:     26.70 cm LA Vol (A2C):   108.0 ml 51.92 ml/m  RA Volume:   97.90 ml  47.07 ml/m LA Vol (A4C):   81.9 ml  39.38 ml/m LA Biplane Vol: 94.2 ml  45.29 ml/m  AORTIC VALVE LVOT Vmax:   88.30 cm/s LVOT Vmean:  55.100 cm/s LVOT VTI:    0.157 m  AORTA Ao Root diam: 3.30 cm Ao Asc diam:  3.90 cm MITRAL VALVE               TRICUSPID VALVE MV Area (PHT): 3.46 cm    TR Peak grad:   59.6 mmHg MV Decel Time: 219 msec    TR Mean grad:   33.0 mmHg MR Peak grad: 87.8 mmHg    TR Vmax:        386.00 cm/s MR Vmax:      468.50 cm/s  TR Vmean:       268.0 cm/s MV E velocity: 68.20 cm/s MV A velocity: 38.40 cm/s   SHUNTS MV E/A ratio:  1.78        Systemic VTI:  0.16 m                            Systemic Diam: 2.00 cm Zoila Shutter MD Electronically signed by Zoila Shutter MD Signature Date/Time: 10/23/2022/4:05:44 PM    Final    CT Angio Chest PE W and/or Wo Contrast  Result Date: 10/23/2022 CLINICAL DATA:  Shortness of breath. EXAM: CT ANGIOGRAPHY CHEST WITH CONTRAST TECHNIQUE: Multidetector CT imaging of the chest was performed using the standard protocol during bolus administration of intravenous contrast. Multiplanar CT image reconstructions and MIPs were obtained to evaluate the vascular anatomy. RADIATION DOSE REDUCTION: This exam was performed according to the departmental dose-optimization program which includes automated exposure control, adjustment of  the mA and/or kV according to patient size and/or use of iterative reconstruction technique. CONTRAST:  100mL OMNIPAQUE IOHEXOL 350 MG/ML SOLN COMPARISON:  Plain film of earlier today.  No prior chest CT. FINDINGS: Cardiovascular: The quality of this exam for evaluation of pulmonary embolism is moderate. Although the bolus is relatively well timed, there is moderate motion degradation and the patient's arms are not raised above the head. No pulmonary embolism to the large segmental level. Aortic atherosclerosis. Upper normal ascending aortic caliber at 3.9 cm. Mild cardiomegaly with moderate right heart dilatation. Lad and left circumflex coronary artery calcification. Pulmonary artery enlargement, outflow tract 3.8 cm Mediastinum/Nodes: No mediastinal or hilar adenopathy. Lungs/Pleura: Bilateral pleural effusions. Mild centrilobular emphysema. Dependent subsegmental atelectasis in both lower lobes. Right upper lobe 7 mm pulmonary nodule on 30/10. Left upper lobe 3 mm nodule on 51/10. Calcified granuloma in the right middle lobe. Upper Abdomen: Cholecystectomy. Normal imaged portions of the liver, spleen, stomach, pancreas, adrenal glands. 3 mm upper pole right  renal collecting system calculus. Musculoskeletal: Mid and lower thoracic spondylosis. Review of the MIP images confirms the above findings. IMPRESSION: 1. Moderate motion and patient position degradation. No pulmonary embolism with limitations above. 2. Small bilateral pleural effusions. 3. Pulmonary artery enlargement suggests pulmonary arterial hypertension. 4. Aortic atherosclerosis (ICD10-I70.0), coronary artery atherosclerosis and emphysema (ICD10-J43.9). 5. Pulmonary nodules of maximally 7 mm. If the patient is at high risk for bronchogenic carcinoma, follow-up chest CT at 3-6 months is recommended. If the patient is at low risk for bronchogenic carcinoma, follow-up chest CT at 6-12 months is recommended. This recommendation follows the consensus statement: "Guidelines for Management of Small Pulmonary Nodules Detected on CT Scans: A Statement from the Fleischner Society" as published in Radiology 2005; 237:395-400. Online at: DietDisorder.czhttp://www.med.um[employer].edu/rad/res/Fleischner-nodule.htm. 6. Right nephrolithiasis Electronically Signed   By: Jeronimo GreavesKyle  Talbot M.D.   On: 10/23/2022 11:32   DG Chest Port 1 View  Result Date: 10/23/2022 CLINICAL DATA:  81 year old male with history of shortness of breath. Orthopnea. Left-sided chest pain. EXAM: PORTABLE CHEST 1 VIEW COMPARISON:  Chest x-ray 07/18/2022. FINDINGS: Skin fold artifact over the upper right hemithorax. Lung volumes are normal. Mild diffuse interstitial prominence and peribronchial cuffing, most evident in the lung bases. No consolidative airspace disease. No pleural effusions. No pneumothorax. No pulmonary nodule or mass noted. Pulmonary vasculature and the cardiomediastinal silhouette are within normal limits. Atherosclerotic calcifications in the thoracic aorta. IMPRESSION: 1. Diffuse peribronchial cuffing and interstitial prominence concerning for an acute bronchitis. 2. Aortic atherosclerosis. Electronically Signed   By: Trudie Reedaniel  Entrikin M.D.   On:  10/23/2022 08:36    Microbiology: Results for orders placed or performed during the hospital encounter of 10/23/22  SARS Coronavirus 2 by RT PCR (hospital order[facility]) *cepheid single result test* Anterior Nasal Swab     Status: None   Collection Time: 10/23/22  1:21 PM   Specimen: Anterior Nasal Swab  Result Value Ref Range Status   SARS Coronavirus 2 by RT PCR NEGATIVE NEGATIVE Final    Comment: (NOTE) SARS-CoV-2 target nucleic acids are NOT DETECTED.  The SARS-CoV-2 RNA is generally detectable in upper and lower respiratory specimens during the acute phase of infection. The lowest concentration of SARS-CoV-2 viral copies this assay can detect is 250 copies / mL. A negative result does not preclude SARS-CoV-2 infection and should not be used as the sole basis for treatment or other patient management decisions.  A negative result may occur with improper specimen  collection / handling, submission of specimen other than nasopharyngeal swab, presence of viral mutation(s) within the areas targeted by this assay, and inadequate number of viral copies (<250 copies / mL). A negative result must be combined with clinical observations, patient history, and epidemiological information.  Fact Sheet for Patients:   RoadLapTop.co.za  Fact Sheet for Healthcare Providers: http://kim-miller.com/  This test is not yet approved or  cleared by the [place] FDA and has been authorized for detection and/or diagnosis of SARS-CoV-2 by FDA under an Emergency Use Authorization (EUA).  This EUA will remain in effect (meaning this test can be used) for the duration of the COVID-19 declaration under Section 564(b)(1) of the Act, 21 U.S.C. section 360bbb-3(b)(1), unless the authorization is terminated or revoked sooner.  Performed at [facility], [address], [place], [place] [address]   Respiratory  (~20 pathogens) panel by PCR     Status: None   Collection Time: 10/23/22  1:21 PM   Specimen: Anterior Nasal Swab; Respiratory  Result Value Ref Range Status   Adenovirus NOT DETECTED NOT DETECTED Final   Coronavirus 229E NOT DETECTED NOT DETECTED Final    Comment: (NOTE) The Coronavirus on the Respiratory Panel, DOES NOT test for the novel  Coronavirus (2019 nCoV)    Coronavirus HKU1 NOT DETECTED NOT DETECTED Final   Coronavirus NL63 NOT DETECTED NOT DETECTED Final   Coronavirus OC43 NOT DETECTED NOT DETECTED Final   Metapneumovirus NOT DETECTED NOT DETECTED Final   Rhinovirus / Enterovirus NOT DETECTED NOT DETECTED Final   Influenza A NOT DETECTED NOT DETECTED Final   Influenza B NOT DETECTED NOT DETECTED Final   Parainfluenza Virus 1 NOT DETECTED NOT DETECTED Final   Parainfluenza Virus 2 NOT DETECTED NOT DETECTED Final   Parainfluenza Virus 3 NOT DETECTED NOT DETECTED Final   Parainfluenza Virus 4 NOT DETECTED NOT DETECTED Final   Respiratory Syncytial Virus NOT DETECTED NOT DETECTED Final   Bordetella pertussis NOT DETECTED NOT DETECTED Final   Bordetella Parapertussis NOT DETECTED NOT DETECTED Final   Chlamydophila pneumoniae NOT DETECTED NOT DETECTED Final   Mycoplasma pneumoniae NOT DETECTED NOT DETECTED Final    Comment: Performed at [facility], [address], [place], [place] [address]    Labs: CBC: Recent Labs  Lab 10/23/22 0830 10/23/22 1321 10/24/22 0403 10/26/22 0327  WBC 5.8 5.6 5.6 5.6  HGB 12.8* 12.2* 12.5* 12.6*  HCT 39.9 38.5* 38.6* 38.6*  MCV 108.4* 109.4* 107.8* 107.2*  PLT 213 204 205 231   Basic Metabolic Panel: Recent Labs  Lab 10/23/22 0955 10/23/22 1321 10/24/22 0403 10/24/22 1428 10/25/22 0343 10/26/22 0327  NA 142  --  140  --  138 139  K 3.1*  --  3.3*  --  3.9 3.7  CL 107  --  105  --  105 109  CO2 26  --  25  --  27 23  GLUCOSE 98  --  97  --  104* 95  BUN 15  --  20  --  28* 32*  CREATININE 1.41* 1.19 1.68*  --  1.78*  1.76*  CALCIUM 8.4*  --  8.6*  --  9.1 8.8*  MG  --  1.2*  --  2.4  --   --    Liver Function Tests: Recent Labs  Lab 10/24/22 0403  AST 15  ALT 14  ALKPHOS 55  BILITOT 1.6*  PROT 6.5  ALBUMIN 3.6   CBG: Recent Labs  Lab 10/24/22 0744  GLUCAP 87    Discharge time spent: approximately 35 minutes spent on discharge counseling, evaluation of patient on day of discharge, and coordination of discharge planning with nursing, social work, pharmacy and case management  Signed: Alberteen Sam, MD [facility] 10/26/2022

## 2022-10-26 NOTE — Assessment & Plan Note (Signed)
Underwent cardiac cath that showed normal right heart pressures, pulmonary hypertension ruled out.

## 2022-10-26 NOTE — Assessment & Plan Note (Signed)
AKI has been ruled out, CKD 3b only

## 2022-10-26 NOTE — Progress Notes (Addendum)
Sherelle, Supervisor, at bedside, manual pressure held for approximately 10 minutes, Manual BP cuff placed, but removed, zephyr band removed and tr band placed by Sherelle, 11 cc noted in band, continued to hold manual pressure for 3-4 minutes, rue remains elevated on pillow and blankets, RAC with gauze and co-band intact, see flowsheets and MAR, safety maintained

## 2022-10-26 NOTE — Progress Notes (Addendum)
Progress Note  Patient Name: Thomas [name] Date of Encounter: 10/26/2022  Primary Cardiologist: Kristeen Miss, MD  Subjective   Feeling well, no complaints. Ready for cath today. No CP or SOB.  Inpatient Medications    Scheduled Meds:  amiodarone  200 mg Oral Once per day on Mon Tue Wed Thu Fri Sat   aspirin EC  81 mg Oral Daily   clopidogrel  75 mg Oral Q breakfast   enoxaparin (LOVENOX) injection  40 mg Subcutaneous Q24H   levothyroxine  150 mcg Oral Daily   rosuvastatin  10 mg Oral Daily   sodium chloride flush  3 mL Intravenous Q12H   Continuous Infusions:  sodium chloride 10 mL/hr at 10/26/22 0737   sodium chloride     sodium chloride     PRN Meds: sodium chloride, acetaminophen **OR** acetaminophen, ondansetron **OR** ondansetron (ZOFRAN) IV, sodium chloride flush   Vital Signs    Vitals:   10/25/22 0507 10/25/22 1321 10/25/22 2000 10/26/22 0500  BP: 131/82 131/86 (!) 141/74   Pulse: (!) 50 (!) 56 (!) 58   Resp: 16 20    Temp: 98 F (36.7 C) 97.9 F (36.6 C) 97.7 F (36.5 C)   TempSrc: Oral Oral Oral   SpO2: 95% 98% 98%   Weight:    80.7 kg  Height:       No intake or output data in the 24 hours ending 10/26/22 0757    10/26/2022    5:00 AM 10/25/2022    5:00 AM 10/24/2022    4:51 PM  Last 3 Weights  Weight (lbs) 178 lb 177 lb 11.2 oz 177 lb 1.6 oz  Weight (kg) 80.74 kg 80.604 kg 80.332 kg     Telemetry    NSR/SB HR upper 40s/mid 50s, no significant arrhythmias - Personally Reviewed  ECG    No new tracings - Personally Reviewed  Physical Exam   GEN: No acute distress.  HEENT: Normocephalic, atraumatic, sclera non-icteric. Neck: No JVD or bruits. Cardiac: RRR no murmurs, rubs, or gallops.  Respiratory: Clear to auscultation bilaterally. Breathing is unlabored. GI: Soft, nontender, non-distended, BS +x 4. MS: no deformity.  Skin discoloration of upper extremities - chronic appearing Extremities: No clubbing or cyanosis. No edema.  Distal pedal pulses are 2+ and equal bilaterally. Neuro:  AAOx3. Follows commands. Psych:  Responds to questions appropriately with a normal affect.  Labs    High Sensitivity Troponin:   Recent Labs  Lab 10/23/22 0830 10/23/22 0955  TROPONINIHS 37* 47*      Cardiac EnzymesNo results for input(s): "TROPONINI" in the last 168 hours. No results for input(s): "TROPIPOC" in the last 168 hours.   Chemistry Recent Labs  Lab 10/24/22 0403 10/25/22 0343 10/26/22 0327  NA 140 138 139  K 3.3* 3.9 3.7  CL 105 105 109  CO2 25 27 23   GLUCOSE 97 104* 95  BUN 20 28* 32*  CREATININE 1.68* 1.78* 1.76*  CALCIUM 8.6* 9.1 8.8*  PROT 6.5  --   --   ALBUMIN 3.6  --   --   AST 15  --   --   ALT 14  --   --   ALKPHOS 55  --   --   BILITOT 1.6*  --   --   GFRNONAA 41* 38* 39*  ANIONGAP 10 6 7      Hematology Recent Labs  Lab 10/23/22 1321 10/24/22 0403 10/26/22 0327  WBC 5.6 5.6 5.6  RBC 3.52* 3.58* 3.60*  HGB 12.2* 12.5* 12.6*  HCT 38.5* 38.6* 38.6*  MCV 109.4* 107.8* 107.2*  MCH 34.7* 34.9* 35.0*  MCHC 31.7 32.4 32.6  RDW 15.0 15.0 14.8  PLT 204 205 231    BNP Recent Labs  Lab 10/23/22 0831 10/24/22 0403  BNP 1,287.6* 1,430.4*     DDimer  Recent Labs  Lab 10/23/22 0830  DDIMER 0.56*     Radiology    No results found.  Cardiac Studies       2D echo 10/23/22   1. Left ventricular ejection fraction, by estimation, is 40 to 45%. Left  ventricular ejection fraction by 2D MOD biplane is 42.3 %. The left  ventricle has mildly decreased function. The left ventricle demonstrates  global hypokinesis. The left  ventricular internal cavity size was mildly dilated. Left ventricular  diastolic parameters are consistent with Grade II diastolic dysfunction  (pseudonormalization). Elevated left ventricular end-diastolic pressure.   2. Right ventricular systolic function is normal. The right ventricular  size is normal. There is severely elevated pulmonary artery  systolic  pressure. The estimated right ventricular systolic pressure is 67.6 mmHg.   3. Left atrial size was moderately dilated.   4. Right atrial size was moderately dilated.   5. The mitral valve is abnormal. Mild mitral valve regurgitation.   6. The aortic valve is tricuspid. Aortic valve regurgitation is trivial.  Aortic valve sclerosis is present, with no evidence of aortic valve  stenosis.   7. Aortic dilatation noted. There is borderline dilatation of the  ascending aorta, measuring 39 mm.   8. The inferior vena cava is normal in size with <50% respiratory  variability, suggesting right atrial pressure of 8 mmHg.   Comparison(s): Changes from prior study are noted. 07/02/2019: LVEF 60-65%.   Patient Profile     81 y.o. male with CAD with Taxus DES to ostial/prox LAD (stent hangs out into LM slightly) 2004 with subsequent hx of stent thrombosis with embolization down the LCx >> Lifelong DAPT, HTN, HLD, hypothyroidism, frequent symptomatic PVCs treated with amiodarone, RBBB+LAFB, CKD stage 3b (baseline Cr 1.6-2.1 recently) admitted to [facility] with recurrent chest pain, elevated BP. Found to have slight bradycardia, hypokalemia, EF 40-45% by echo (down from 60-65% in 2020)   Assessment & Plan    1. CAD, chest pain, mild troponin elevation - hsTroponin peaked at 47 - currently tx with ASA, Plavix, rosuvastatin - not on BB with bradycardia - Cr has plateaued, plan cath today per confirmation with Dr. Izora Ribas - has KVO IVF given CHF  Shared Decision Making/Informed Consent The risks [stroke (1 in 1000), death (1 in 1000), kidney failure [usually temporary] (1 in 500), bleeding (1 in 200), allergic reaction [possibly serious] (1 in 200)], benefits (diagnostic support and management of coronary artery disease) and alternatives of a cardiac catheterization were discussed in detail with Thomas [name] and he is willing to proceed.  2. Acute HFrEF with severe pulmonary HTN - has received  periodic Lasix dosing this admission, none ordered 11/14 onward in anticipation of cath with renal insufficiency - no PE on CTA - further recs pending cath outcome - last dose lisinopril 11/13 in anticipation of transitioning to losartan (given 11/13) then eventual plan for Entresto - but given need for cath, we'll hold on further ARB plans - no BB with bradycardia   3. Bradycardia, with hx of frequent PVCs on amiodarone, known bifascicular block - on amiodarone daily except Sunday, continuing for now per MD - not on BB at this  time - TSH wnl   4. CKD stage 3b - review of recent Cr shows values around 1.6-2.13, peaking in 04/2022, with last OP value 1.68 in 07/2022) - 1.19 mid admission seems to be outlier  - hold off any nephrotoxic agents at this time - received CT angio 10/23/22 so need to follow closely - Cr 1.76 today, slight uptrend from admission but not dramatically different than prior baseline - anticipate to follow post-cath   5. Elevated procalcitonin level of 19.80 - ? Unclear etiology - defer infectious w/u to primary team   6. Mild MR, borderline dilation of ascending aorta - to follow as OP   7. Mild anemia with macrocytosis - per medicine team   8. Pulmonary nodules - per report, "Pulmonary nodules of maximally 7 mm. If the patient is at high risk for bronchogenic carcinoma, follow-up chest CT at 3-6 months is recommended. If the patient is at low risk for bronchogenic carcinoma, follow-up chest CT at 6-12 months is recommended" - needs OP f/u for this  Have tentatively arranged close f/u with Dr. Elease Hashimoto on 7 day hold slot (pt wanted to see), appt info placed on AVS - 11/01/22  For questions or updates, please contact [facility] Please consult [email or web address] for contact info under Cardiology/STEMI.  Signed, Laurann Montana, PA-C 10/26/2022, 7:57 AM     Personally seen and examined. Agree with APP above with the following comments: No further chest pain.    R radial site without hematoma.  Patient feels well. Stable CAD, minimal contrast, pain was unlikely cardiac in nature. PCWP 10.  - I suspect we unmasked his CKD when we gave diuretics to get him euvolemic. - no change to his DAPT or statin. - transitioned to ARB as, in the future may benefit from ARNI transition - discussed only doing PRN lasix 20 mg for LE swelling in the setting of contrast load today - no BB in the setting of bradycardia - no change to his amiodarone Has 10/2022 f/u for consideration of escalation in GDMT - patient and daugther has no further concerns.  Riley Lam, MD FASE [facility] [address] [place], [address] [place], [place] [address] [phone]  10:33 AM

## 2022-10-26 NOTE — Progress Notes (Signed)
LOCATION: right RADIAL  DEFLATED PER PROTOCOL: YES  TIME BAND OFF/DRESSING APPLIED: 1320, gauze with tegaderm placed  SITE UPON ARRIVAL: LEVEL 0  SITE AFTER BAND REMOVAL: LEVEL 0  CIRCULATION SENSATION AND MOVEMENT: +2 radial pulse palpable, + movement  COMMENTS: pt repositioned in bed, RN x2 at bedside

## 2022-10-26 NOTE — Interval H&P Note (Signed)
History and Physical Interval Note:  10/26/2022 8:58 AM  Thomas [name]  has presented today for surgery, with the diagnosis of chest pain.  The various methods of treatment have been discussed with the patient and family. After consideration of risks, benefits and other options for treatment, the patient has consented to  Procedure(s): RIGHT/LEFT HEART CATH AND CORONARY ANGIOGRAPHY (N/A)  POSSIBLE PERCUTANEOUS CORONARY INTERVENTION  as a surgical intervention.  The patient's history has been reviewed, patient examined, no change in status, stable for surgery.  I have reviewed the patient's chart and labs.  Questions were answered to the patient's satisfaction.    Cath Lab Visit (complete for each Cath Lab visit)  Clinical Evaluation Leading to the Procedure:   ACS: No.  Non-ACS:    Anginal Classification: CCS I  Anti-ischemic medical therapy: Maximal Therapy (2 or more classes of medications)  Non-Invasive Test Results: Equivocal test results - NEWLY REDUCED EF  Prior CABG: No previous CABG    Bryan Lemma

## 2022-10-26 NOTE — Telephone Encounter (Signed)
Dr. Maryfrances Bunnell reached out for post-cath restrictions, patient has been discharged (I was in clinic this PM). I called patient and relayed "No driving for 1 week. No lifting over 5-10 lbs for 2 weeks. No sexual activity for 2 weeks.Keep procedure site clean & dry. If you notice increased pain, swelling, bleeding or pus, call/return!  You may shower, but no soaking baths/hot tubs/pools for 1 week. " Pt verbalized understanding/gratitude.

## 2022-10-26 NOTE — Progress Notes (Signed)
Discharge medication omitted.

## 2022-10-26 NOTE — Assessment & Plan Note (Signed)
Ischemia ruled out.  Underwent cardiac cath that showed stable coronary disease, no significant stenosis, normal heart pressures.

## 2022-10-26 NOTE — Progress Notes (Signed)
Mobility Specialist - Progress Note   10/26/22 1531  Mobility  Activity Ambulated independently in hallway  Level of Assistance Modified independent, requires aide device or extra time  Assistive Device None  Distance Ambulated (ft) 700 ft  Range of Motion/Exercises Active  Activity Response Tolerated well  Mobility Referral Yes  $Mobility charge 1 Mobility   Pt was found in bed and agreeable to ambulate. Had no complaints during session and HR stayed between 60-65 bpm throughout. At EOS return to sit EOB with necessities in reach.  Billey Chang Mobility Specialist

## 2022-10-27 ENCOUNTER — Encounter (HOSPITAL_COMMUNITY): Payer: Self-pay | Admitting: Cardiology

## 2022-10-28 LAB — POCT I-STAT 7, (LYTES, BLD GAS, ICA,H+H)
Acid-base deficit: 1 mmol/L (ref 0.0–2.0)
Bicarbonate: 22.4 mmol/L (ref 20.0–28.0)
Calcium, Ion: 1.24 mmol/L (ref 1.15–1.40)
HCT: 38 % — ABNORMAL LOW (ref 39.0–52.0)
Hemoglobin: 12.9 g/dL — ABNORMAL LOW (ref 13.0–17.0)
O2 Saturation: 97 %
Potassium: 3.8 mmol/L (ref 3.5–5.1)
Sodium: 139 mmol/L (ref 135–145)
TCO2: 23 mmol/L (ref 22–32)
pCO2 arterial: 33.1 mmHg (ref 32–48)
pH, Arterial: 7.438 (ref 7.35–7.45)
pO2, Arterial: 88 mmHg (ref 83–108)

## 2022-10-28 LAB — POCT I-STAT EG7
Acid-Base Excess: 0 mmol/L (ref 0.0–2.0)
Acid-Base Excess: 0 mmol/L (ref 0.0–2.0)
Bicarbonate: 24.8 mmol/L (ref 20.0–28.0)
Bicarbonate: 25.1 mmol/L (ref 20.0–28.0)
Calcium, Ion: 1.24 mmol/L (ref 1.15–1.40)
Calcium, Ion: 1.25 mmol/L (ref 1.15–1.40)
HCT: 38 % — ABNORMAL LOW (ref 39.0–52.0)
HCT: 38 % — ABNORMAL LOW (ref 39.0–52.0)
Hemoglobin: 12.9 g/dL — ABNORMAL LOW (ref 13.0–17.0)
Hemoglobin: 12.9 g/dL — ABNORMAL LOW (ref 13.0–17.0)
O2 Saturation: 69 %
O2 Saturation: 70 %
Potassium: 3.8 mmol/L (ref 3.5–5.1)
Potassium: 3.9 mmol/L (ref 3.5–5.1)
Sodium: 140 mmol/L (ref 135–145)
Sodium: 141 mmol/L (ref 135–145)
TCO2: 26 mmol/L (ref 22–32)
TCO2: 26 mmol/L (ref 22–32)
pCO2, Ven: 40.3 mmHg — ABNORMAL LOW (ref 44–60)
pCO2, Ven: 41.4 mmHg — ABNORMAL LOW (ref 44–60)
pH, Ven: 7.39 (ref 7.25–7.43)
pH, Ven: 7.397 (ref 7.25–7.43)
pO2, Ven: 37 mmHg (ref 32–45)
pO2, Ven: 37 mmHg (ref 32–45)

## 2022-10-31 ENCOUNTER — Encounter: Payer: Self-pay | Admitting: Cardiovascular Disease

## 2022-10-31 NOTE — Progress Notes (Unsigned)
Cardiology Office Note   Date:  11/01/2022   ID:  Thomas [name], Thomas [name] July 02, 1941, MRN [id number]  PCP:  Leonard Downing, MD  Cardiologist:   Mertie Moores, MD   Chief Complaint  Patient presents with   Coronary Artery Disease        Problem list: 1. Coronary artery disease-has a stent in his left anterior descending artery. The proximal edge of the stent hangs out into the left main. He's had thrombus formation on the proximal edge of the stent that embolized down the left circumflex artery when he was off Plavix. Because of this we have kept him on Plavix lifelong   2. Essential hypertension -   3. Hyperlipidemia-   Thomas [name] is a 81 year old gentleman with a history of coronary artery disease. He is status post PTCA and stenting of his left anterior descending artery. He's had thrombus formation on the proximal edge of the stent which extended out over the left circumflex artery. He's been on Plavix since that time.  He complains of some hip pain.  He still smokes a few cigarettes a day.  February 26, 2014:  Since I last saw him 1 1/2 years ago, he has had hip surgery .    I had recommneded that he not stop his plavix.  Dr. Maureen Ralphs opperated on him ON plavix and had no significant bleeding episodes.    He has not had any CP   February 26, 2015 Thomas [name] is a 81 y.o. male who presents for pre op visit for left hip replacement.  He had his last hip replacment WHILE on full dose Plavix and had no bleeding .  He has no CP or dyspnea.  He is limited by his hip problems.   February 29, 2016:  Doing well.   Had another hip replacement in May .  Had the surgery while on Plavix . No CP Stays active.  Walks a mile a day   February 28, 2017:  Thomas [name] is seen back today for f/u of his CAD No CP or dyspnea.  Exercising regularly   April 02, 2018:  Thomas [name] is seen today for follow up of his CAD , hypertension, hyperlipidemia. Walks some,  2 miles a day - perhaps twice a  week ,  No CP o r dyspnea.   No dizziness.  He is on aspirin 325 mg a day.  We will have him decrease his dose to 81 mg a day. Also refill his nitroglycerin.   Oct. 2, 2020   [name] is seen today for follow-up of his coronary artery disease, hypertension and hyperlipidemia.  HR has been slow for the past month or so. Has been in bigeminy. ( so his reported slow HR is actually bigeminy PVCs)    has had shortness of breath - more dyspnea over the past month  Echo shows normal LV systolic function with EF 60-65% .   Could not assess diastolic function   He has CAD - his symptoms were just dyspnea.   Has never had any angina .   Was recently admitted for near syncope thought to be  volume depletion   Jan. 12, 2021: Seen for follow up of his CAD , HTN, hyperllipidemia  Had bigeminy and we set him up to see Dr. Lovena Le. He was stated on amiodarone which has helped .  Is sleeping better   Jan. 20, 2022 Thomas [name] is seen today for follow up of his CAD, HTN, HLD  He is on lifelong plavix Was walking regularly until this most recent snow / ice No angina  I reviewed labs from Dr. Arelia Sneddon office.  His total cholesterol is 126 Triglyceride level is 78 HDL is 55 LDL is 56. His labs look good.  We will continue with current medications.  April 04, 2022:  Thomas [name] is seen today for a follow-up visit.[name] He needs to have a lithotripsy. He is on lifelong plavix    Nov. 21, 2023 Thomas [name] is seen today for follow up Was hospitalized with acute on chronic CHF, increasing dyspnea  Hx of CAD  Had a cath recently: normal R heart pressures, coronaries were stable  Echocardiogram on October 23, 2022 reveals LVEF of 40 to 45%.  He has grade 2 diastolic dysfunction. He has severely elevated pulmonary pressures with an estimated PA pressure of 67 mmHg. Mild mitral regurgitation.  Trivial aortic insufficiency.   Will start Entresto 24-26 BID  Reduce lasix to 20 mg on M,W,F  Pt may drive    Will see him On Dec. 15, with BMP    Past Medical History:  Diagnosis Date   Arthritis    Bifascicular bundle branch block    Bigeminy 06/2019   Coronary artery disease    takes Plavix and ASA daily   GERD (gastroesophageal reflux disease)    occasional and will take Tums if needed   History of blood clots 2008   in chest   History of kidney stones late 90's   Hyperlipidemia    takes Simvastatin daily   Hypertension    takes Maxzide daily   Hypothyroidism    takes Synthroid daily   Joint pain    Joint swelling    left knee   Pneumonia    at 81 years old, double    Past Surgical History:  Procedure Laterality Date   ARTERY BIOPSY Left 12/15/2021   Procedure: LEFT TEMPORAL ARTERY BIOPSY;  Surgeon: Cherre Robins, MD;  Location: [place];  Service: Vascular;  Laterality: Left;   CARDIAC CATHETERIZATION     CHOLECYSTECTOMY N/A 06/06/2018   Procedure: LAPAROSCOPIC CHOLECYSTECTOMY WITH INTRAOPERATIVE CHOLANGIOGRAM;  Surgeon: Donnie Mesa, MD;  Location: [place];  Service: General;  Laterality: N/A;   CORONARY ANGIOPLASTY  05/21/2007   lad stent/taxus stent   CYSTOSCOPY WITH RETROGRADE PYELOGRAM, URETEROSCOPY AND STENT PLACEMENT Right 03/29/2022   Procedure: CYSTOSCOPY WITH RETROGRADE PYELOGRAM AND STENT PLACEMENT;  Surgeon: Lucas Mallow, MD;  Location: [facility];  Service: Urology;  Laterality: Right;   CYSTOSCOPY/URETEROSCOPY/HOLMIUM LASER/STENT PLACEMENT Right 04/18/2022   Procedure: CYSTOSCOPY\ RIGHT URETEROSCOPY/STONE BASKET EXTRACTION/STENT PLACEMENT;  Surgeon: Lucas Mallow, MD;  Location: [facility];  Service: Urology;  Laterality: Right;  60 MINS FOR CASE   HEMORROIDECTOMY  late 1970's   HERNIA REPAIR Right 1980's   RIGHT/LEFT HEART CATH AND CORONARY ANGIOGRAPHY N/A 10/26/2022   Procedure: RIGHT/LEFT HEART CATH AND CORONARY ANGIOGRAPHY;  Surgeon: Leonie Man, MD;  Location: [place] [facility];  Service: Cardiovascular;  Laterality: N/A;   TOTAL HIP ARTHROPLASTY  Right 10/28/2013   Procedure: RIGHT TOTAL HIP ARTHROPLASTY ANTERIOR APPROACH;  Surgeon: Gearlean Alf, MD;  Location: [facility];  Service: Orthopedics;  Laterality: Right;   TOTAL HIP ARTHROPLASTY Left 05/08/2015   Procedure: LEFT TOTAL HIP ARTHROPLASTY ANTERIOR APPROACH;  Surgeon: Gaynelle Arabian, MD;  Location: [place];  Service: Orthopedics;  Laterality: Left;     Current Outpatient Medications  Medication Sig Dispense Refill   acetaminophen (TYLENOL) 500 MG  tablet Take 500 mg by mouth daily as needed for moderate pain.     amiodarone (PACERONE) 200 MG tablet TAKE 1 TABLET BY MOUTH DAILY EXCEPT SUNDAY. DO NOT TAKE ON SUNDAY (Patient taking differently: Take 200 mg by mouth See admin instructions. Take 200 mg by mouth daily six days a week. Do not take on Sundays.) 90 tablet 0   aspirin EC 81 MG tablet Take 1 tablet (81 mg total) by mouth daily.     azelastine (ASTELIN) 0.1 % nasal spray Place 1 spray into both nostrils daily as needed for rhinitis. Use in each nostril as directed     clopidogrel (PLAVIX) 75 MG tablet Take 1 tablet (75 mg total) by mouth daily with breakfast. 90 tablet 3   furosemide (LASIX) 20 MG tablet Take 1 tablet by mouth three times per week on Mondays, Wednesdays, and Fridays 36 tablet 3   levothyroxine (SYNTHROID, LEVOTHROID) 150 MCG tablet Take 150 mcg by mouth daily.     rosuvastatin (CRESTOR) 10 MG tablet Take 10 mg by mouth daily.     sacubitril-valsartan (ENTRESTO) 24-26 MG Take 1 tablet by mouth 2 (two) times daily. 60 tablet 11   No current facility-administered medications for this visit.    Allergies:   Hydrocodone, Robaxin [methocarbamol], Roxicodone [oxycodone], and Ultram [tramadol]    Social History:  The patient  reports that he quit smoking about 7 years ago. His smoking use included cigarettes. He has a 13.00 pack-year smoking history. He has never used smokeless tobacco. He reports that he does not currently use alcohol. He reports that he does not use  drugs.   Family History:  The patient's family history includes CAD in his mother; Heart failure in his father and mother; Pneumonia in his father.    ROS:  Noted in current history, otherwise review of systems is negative.   EKG:    .  Recent Labs: 10/23/2022: TSH 2.203 10/24/2022: ALT 14; B Natriuretic Peptide 1,430.4; Magnesium 2.4 10/26/2022: BUN 32; Creatinine, Ser 1.76; Hemoglobin 12.9; Hemoglobin 12.9; Platelets 231; Potassium 3.8; Potassium 3.9; Sodium 141; Sodium 140    Lipid Panel    Component Value Date/Time   CHOL 130 10/26/2022 0327   CHOL 109 04/02/2018 0919   TRIG 77 10/26/2022 0327   HDL 35 (L) 10/26/2022 0327   HDL 35 (L) 04/02/2018 0919   CHOLHDL 3.7 10/26/2022 0327   VLDL 15 10/26/2022 0327   LDLCALC 80 10/26/2022 0327   LDLCALC 54 04/02/2018 0919      Wt Readings from Last 3 Encounters:  11/01/22 179 lb 9.6 oz (81.5 kg)  10/26/22 178 lb (80.7 kg)  04/18/22 189 lb (85.7 kg)      Other studies Reviewed: Additional studies/ records that were reviewed today include: . Review of the above records demonstrates:    ASSESSMENT AND PLAN:  1. Coronary artery disease-    Cath was stable ,  no worsening CAD, his previously placed stent is stable     2. 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   Agree with starting entresto .   Will reduce lasix to 20 mg a on M,W,F.    2. Essential hypertension  -  BP is well controlled.    3. Hyperlipidemia -  stable ,  following      4.  Kidney stones:    Current medicines are reviewed at length with the patient today.  The patient does not have concerns regarding medicines.  The following  changes have been made:  no change  Labs/ tests ordered today include:   Orders Placed This Encounter  Procedures   CT Chest Wo Contrast   Basic metabolic panel     Disposition:   will see him in several weeks.   Signed, Mertie Moores, MD  11/01/2022 5:50 PM    [facility]  [facility] [address], [place], [place]  [address] Phone: [phone]; Fax: [phone]

## 2022-11-01 ENCOUNTER — Encounter: Payer: Self-pay | Admitting: Cardiovascular Disease

## 2022-11-01 ENCOUNTER — Ambulatory Visit: Payer: Medicare Other | Attending: Cardiovascular Disease | Admitting: Cardiovascular Disease

## 2022-11-01 VITALS — BP 124/56 | HR 64 | Ht 72.0 in | Wt 179.6 lb

## 2022-11-01 DIAGNOSIS — I2510 Atherosclerotic heart disease of native coronary artery without angina pectoris: Secondary | ICD-10-CM | POA: Diagnosis not present

## 2022-11-01 DIAGNOSIS — R911 Solitary pulmonary nodule: Secondary | ICD-10-CM

## 2022-11-01 DIAGNOSIS — I5042 Chronic combined systolic (congestive) and diastolic (congestive) heart failure: Secondary | ICD-10-CM

## 2022-11-01 DIAGNOSIS — Z79899 Other long term (current) drug therapy: Secondary | ICD-10-CM

## 2022-11-01 MED ORDER — ENTRESTO 24-26 MG PO TABS: 1.0000 | ORAL_TABLET | Freq: Two times a day (BID) | ORAL | 11 refills | Status: DC

## 2022-11-01 MED ORDER — FUROSEMIDE 20 MG PO TABS: ORAL_TABLET | ORAL | 3 refills | Status: DC

## 2022-11-01 NOTE — Patient Instructions (Signed)
Medication Instructions:  STOP Losartan START Entresto 24/26mg  twice daily CHANGE Furosemide to 20mg  three times weekly (M, W, F) *If you need a refill on your cardiac medications before your next appointment, please call your pharmacy*  Lab Work: BMET in 3 weeks at next office visit If you have labs (blood work) drawn today and your tests are completely normal, you will receive your results only by: MyChart Message (if you have MyChart) OR A paper copy in the mail If you have any lab test that is abnormal or we need to change your treatment, we will call you to review the results.  Testing/Procedures: CT of lungs (spring 2024) Non-Cardiac CT scanning, (CAT scanning), is a noninvasive, special x-ray that produces cross-sectional images of the body using x-rays and a computer. CT scans help physicians diagnose and treat medical conditions. For some CT exams, a contrast material is used to enhance visibility in the area of the body being studied. CT scans provide greater clarity and reveal more details than regular x-ray exams.  Follow-Up: At [facility], you and your health needs are our priority.  As part of our continuing mission to provide you with exceptional heart care, we have created designated Provider Care Teams.  These Care Teams include your primary Cardiologist (physician) and Advanced Practice Providers (APPs -  Physician Assistants and Nurse Practitioners) who all work together to provide you with the care you need, when you need it.  Your next appointment:   3 week(s)  The format for your next appointment:   In Person  Provider:   [facility], MD       Important Information About Sugar

## 2022-11-24 NOTE — Progress Notes (Signed)
Cardiology Office Note   Date:  11/25/2022   ID:  [name], Thomas [birth year], MRN [id number]  PCP:  Leonard Downing, MD  Cardiologist:   Mertie Moores, MD   Chief Complaint  Patient presents with   Coronary Artery Disease        Hyperlipidemia   Problem list: 1. Coronary artery disease-has a stent in his left anterior descending artery. The proximal edge of the stent hangs out into the left main. He's had thrombus formation on the proximal edge of the stent that embolized down the left circumflex artery when he was off Plavix. Because of this we have kept him on Plavix lifelong   2. Essential hypertension -    3. Hyperlipidemia-   Thomas [name] is a 81 year old gentleman with a history of coronary artery disease. He is status post PTCA and stenting of his left anterior descending artery. He's had thrombus formation on the proximal edge of the stent which extended out over the left circumflex artery. He's been on Plavix since that time.  He complains of some hip pain.  He still smokes a few cigarettes a day.  February 26, 2014:  Since I last saw him 1 1/2 years ago, he has had hip surgery .    I had recommneded that he not stop his plavix.  Dr. Maureen Ralphs opperated on him ON plavix and had no significant bleeding episodes.    He has not had any CP   February 26, 2015 Thomas [name] is a 81 y.o. male who presents for pre op visit for left hip replacement.  He had his last hip replacment WHILE on full dose Plavix and had no bleeding .  He has no CP or dyspnea.  He is limited by his hip problems.   February 29, 2016:  Doing well.   Had another hip replacement in May .  Had the surgery while on Plavix . No CP Stays active.  Walks a mile a day   February 28, 2017:  Thomas [name] is seen back today for f/u of his CAD No CP or dyspnea.  Exercising regularly   April 02, 2018:  Thomas [name] is seen today for follow up of his CAD , hypertension, hyperlipidemia. Walks some,  2 miles a day  - perhaps twice a week ,  No CP o r dyspnea.   No dizziness.  He is on aspirin 325 mg a day.  We will have him decrease his dose to 81 mg a day. Also refill his nitroglycerin.   Oct. 2, 2020   Mr. Furlough is seen today for follow-up of his coronary artery disease, hypertension and hyperlipidemia.  HR has been slow for the past month or so. Has been in bigeminy. ( so his reported slow HR is actually bigeminy PVCs)    has had shortness of breath - more dyspnea over the past month  Echo shows normal LV systolic function with EF 60-65% .   Could not assess diastolic function   He has CAD - his symptoms were just dyspnea.   Has never had any angina .   Was recently admitted for near syncope thought to be  volume depletion   Jan. 12, 2021: Seen for follow up of his CAD , HTN, hyperllipidemia  Had bigeminy and we set him up to see Dr. Lovena Le. He was stated on amiodarone which has helped .  Is sleeping better   Jan. 20, 2022 Thomas [name] is seen today for follow up of his  CAD, HTN, HLD  He is on lifelong plavix Was walking regularly until this most recent snow / ice No angina  I reviewed labs from Dr. Jeannetta Nap office.  His total cholesterol is 126 Triglyceride level is 78 HDL is 55 LDL is 56. His labs look good.  We will continue with current medications.  April 04, 2022:  Thomas [name] is seen today for a follow-up visit.Thomas [name] He needs to have a lithotripsy. He is on lifelong plavix    Nov. 21, 2023 Thomas [name] is seen today for follow up Was hospitalized with acute on chronic CHF, increasing dyspnea  Hx of CAD  Had a cath recently: normal R heart pressures, coronaries were stable  Echocardiogram on October 23, 2022 reveals LVEF of 40 to 45%.  He has grade 2 diastolic dysfunction. He has severely elevated pulmonary pressures with an estimated PA pressure of 67 mmHg. Mild mitral regurgitation.  Trivial aortic insufficiency.   Will start Entresto 24-26 BID  Reduce lasix to 20 mg on  M,W,F  Pt may drive   Will see him On Dec. 15, with BMP    Dec. 15,2023   Thomas [name] is seen for follow up Hx of CAD Chronic CHF, increasing dyspnea We started entresto at his last visit , is tolerating the Entresto well  Weight has been steady    Past Medical History:  Diagnosis Date   Arthritis    Bifascicular bundle branch block    Bigeminy 06/2019   Coronary artery disease    takes Plavix and ASA daily   GERD (gastroesophageal reflux disease)    occasional and will take Tums if needed   History of blood clots 2008   in chest   History of kidney stones late 90's   Hyperlipidemia    takes Simvastatin daily   Hypertension    takes Maxzide daily   Hypothyroidism    takes Synthroid daily   Joint pain    Joint swelling    left knee   Pneumonia    at 81 years old, double    Past Surgical History:  Procedure Laterality Date   ARTERY BIOPSY Left 12/15/2021   Procedure: LEFT TEMPORAL ARTERY BIOPSY;  Surgeon: Leonie Douglas, MD;  Location: [facility] OR;  Service: Vascular;  Laterality: Left;   CARDIAC CATHETERIZATION     CHOLECYSTECTOMY N/A 06/06/2018   Procedure: LAPAROSCOPIC CHOLECYSTECTOMY WITH INTRAOPERATIVE CHOLANGIOGRAM;  Surgeon: Manus Rudd, MD;  Location: [facility] OR;  Service: General;  Laterality: N/A;   CORONARY ANGIOPLASTY  05/21/2007   lad stent/taxus stent   CYSTOSCOPY WITH RETROGRADE PYELOGRAM, URETEROSCOPY AND STENT PLACEMENT Right 03/29/2022   Procedure: CYSTOSCOPY WITH RETROGRADE PYELOGRAM AND STENT PLACEMENT;  Surgeon: Crista Elliot, MD;  Location: [facility] ORS;  Service: Urology;  Laterality: Right;   CYSTOSCOPY/URETEROSCOPY/HOLMIUM LASER/STENT PLACEMENT Right 04/18/2022   Procedure: CYSTOSCOPY\ RIGHT URETEROSCOPY/STONE BASKET EXTRACTION/STENT PLACEMENT;  Surgeon: Crista Elliot, MD;  Location: [facility];  Service: Urology;  Laterality: Right;  60 MINS FOR CASE   HEMORROIDECTOMY  late 1970's   HERNIA REPAIR Right 1980's   RIGHT/LEFT HEART CATH AND CORONARY  ANGIOGRAPHY N/A 10/26/2022   Procedure: RIGHT/LEFT HEART CATH AND CORONARY ANGIOGRAPHY;  Surgeon: Marykay Lex, MD;  Location: [facility] INVASIVE CV LAB;  Service: Cardiovascular;  Laterality: N/A;   TOTAL HIP ARTHROPLASTY Right 10/28/2013   Procedure: RIGHT TOTAL HIP ARTHROPLASTY ANTERIOR APPROACH;  Surgeon: Loanne Drilling, MD;  Location: [facility];  Service: Orthopedics;  Laterality: Right;   TOTAL HIP ARTHROPLASTY Left 05/08/2015  Procedure: LEFT TOTAL HIP ARTHROPLASTY ANTERIOR APPROACH;  Surgeon: Gaynelle Arabian, MD;  Location: [facility];  Service: Orthopedics;  Laterality: Left;     Current Outpatient Medications  Medication Sig Dispense Refill   acetaminophen (TYLENOL) 500 MG tablet Take 500 mg by mouth daily as needed for moderate pain.     amiodarone (PACERONE) 200 MG tablet TAKE 1 TABLET BY MOUTH DAILY EXCEPT SUNDAY. DO NOT TAKE ON SUNDAY (Patient taking differently: Take 200 mg by mouth See admin instructions. Take 200 mg by mouth daily six days a week. Do not take on Sundays.) 90 tablet 0   aspirin EC 81 MG tablet Take 1 tablet (81 mg total) by mouth daily.     azelastine (ASTELIN) 0.1 % nasal spray Place 1 spray into both nostrils daily as needed for rhinitis. Use in each nostril as directed     clopidogrel (PLAVIX) 75 MG tablet Take 1 tablet (75 mg total) by mouth daily with breakfast. 90 tablet 3   furosemide (LASIX) 20 MG tablet Take 1 tablet by mouth three times per week on Mondays, Wednesdays, and Fridays 36 tablet 3   levothyroxine (SYNTHROID, LEVOTHROID) 150 MCG tablet Take 150 mcg by mouth daily.     rosuvastatin (CRESTOR) 10 MG tablet Take 10 mg by mouth daily.     sacubitril-valsartan (ENTRESTO) 24-26 MG Take 1 tablet by mouth 2 (two) times daily. 60 tablet 11   No current facility-administered medications for this visit.    Allergies:   Hydrocodone, Robaxin [methocarbamol], Roxicodone [oxycodone], and Ultram [tramadol]    Social History:  The patient  reports that he quit  smoking about 7 years ago. His smoking use included cigarettes. He has a 13.00 pack-year smoking history. He has never used smokeless tobacco. He reports that he does not currently use alcohol. He reports that he does not use drugs.   Family History:  The patient's family history includes CAD in his mother; Heart failure in his father and mother; Pneumonia in his father.    ROS:  Noted in current history, otherwise review of systems is negative.  Physical Exam: Blood pressure 120/80, pulse 65, height 6' (1.829 m), weight 182 lb 12.8 oz (82.9 kg), SpO2 98 %.      GEN:  elderly  well developed in no acute distress HEENT: Normal NECK: No JVD; soft Left carotid bruit  LYMPHATICS: No lymphadenopathy CARDIAC: RRR , no murmurs, rubs, gallops RESPIRATORY:  Clear to auscultation without rales, wheezing or rhonchi  ABDOMEN: Soft, non-tender, non-distended MUSCULOSKELETAL:  No edema; No deformity  SKIN: Warm and dry NEUROLOGIC:  Alert and oriented x 3   EKG:    .  Recent Labs: 10/23/2022: TSH 2.203 10/24/2022: ALT 14; B Natriuretic Peptide 1,430.4; Magnesium 2.4 10/26/2022: BUN 32; Creatinine, Ser 1.76; Hemoglobin 12.9; Hemoglobin 12.9; Platelets 231; Potassium 3.8; Potassium 3.9; Sodium 141; Sodium 140    Lipid Panel    Component Value Date/Time   CHOL 130 10/26/2022 0327   CHOL 109 04/02/2018 0919   TRIG 77 10/26/2022 0327   HDL 35 (L) 10/26/2022 0327   HDL 35 (L) 04/02/2018 0919   CHOLHDL 3.7 10/26/2022 0327   VLDL 15 10/26/2022 0327   LDLCALC 80 10/26/2022 0327   LDLCALC 54 04/02/2018 0919      Wt Readings from Last 3 Encounters:  11/25/22 182 lb 12.8 oz (82.9 kg)  11/01/22 179 lb 9.6 oz (81.5 kg)  10/26/22 178 lb (80.7 kg)      Other studies Reviewed: Additional  studies/ records that were reviewed today include: . Review of the above records demonstrates:    ASSESSMENT AND PLAN:  1. Coronary artery disease-   Stable , no recent angina ,  needs lifelong plavix.     2. Chronic combined CHF :  on entresto , cont current meds.  Will see him in 6 months    2. Essential hypertension  -  BP is well controlled.    3. Hyperlipidemia - stable       4.  Kidney stones:    Current medicines are reviewed at length with the patient today.  The patient does not have concerns regarding medicines.  The following changes have been made:  no change  Labs/ tests ordered today include:   No orders of the defined types were placed in this encounter.    Disposition:   will see him in 6 months    Signed, Mertie Moores, MD  11/25/2022 11:33 AM    [facility] [address], [place], [place]  [address] Phone: [phone]; Fax: [phone]

## 2022-11-25 ENCOUNTER — Encounter: Payer: Self-pay | Admitting: Cardiovascular Disease

## 2022-11-25 ENCOUNTER — Ambulatory Visit: Payer: Medicare Other | Attending: Cardiovascular Disease | Admitting: Cardiovascular Disease

## 2022-11-25 ENCOUNTER — Ambulatory Visit: Payer: Medicare Other

## 2022-11-25 VITALS — BP 120/80 | HR 65 | Ht 72.0 in | Wt 182.8 lb

## 2022-11-25 DIAGNOSIS — I2510 Atherosclerotic heart disease of native coronary artery without angina pectoris: Secondary | ICD-10-CM

## 2022-11-25 DIAGNOSIS — Z79899 Other long term (current) drug therapy: Secondary | ICD-10-CM

## 2022-11-25 DIAGNOSIS — I5042 Chronic combined systolic (congestive) and diastolic (congestive) heart failure: Secondary | ICD-10-CM

## 2022-11-25 DIAGNOSIS — R0989 Other specified symptoms and signs involving the circulatory and respiratory systems: Secondary | ICD-10-CM

## 2022-11-25 DIAGNOSIS — E782 Mixed hyperlipidemia: Secondary | ICD-10-CM

## 2022-11-25 LAB — BASIC METABOLIC PANEL
BUN/Creatinine Ratio: 13 (ref 10–24)
BUN: 25 mg/dL (ref 8–27)
CO2: 25 mmol/L (ref 20–29)
Calcium: 9.1 mg/dL (ref 8.6–10.2)
Chloride: 105 mmol/L (ref 96–106)
Creatinine, Ser: 1.86 mg/dL — ABNORMAL HIGH (ref 0.76–1.27)
Glucose: 101 mg/dL — ABNORMAL HIGH (ref 70–99)
Potassium: 4 mmol/L (ref 3.5–5.2)
Sodium: 144 mmol/L (ref 134–144)
eGFR: 36 mL/min/{1.73_m2} — ABNORMAL LOW (ref >59)

## 2022-11-25 MED ORDER — FUROSEMIDE 20 MG PO TABS: ORAL_TABLET | ORAL | 3 refills | Status: DC

## 2022-11-25 MED ORDER — AMIODARONE HCL 200 MG PO TABS: ORAL_TABLET | ORAL | 3 refills | Status: DC

## 2022-11-25 NOTE — Patient Instructions (Signed)
Medication Instructions:  DECREASE Amiodarone to 100mg  daily REFILLED Furosemide (three times weekly) *If you need a refill on your cardiac medications before your next appointment, please call your pharmacy*   Lab Work: BMET today If you have labs (blood work) drawn today and your tests are completely normal, you will receive your results only by: MyChart Message (if you have MyChart) OR A paper copy in the mail If you have any lab test that is abnormal or we need to change your treatment, we will call you to review the results.   Testing/Procedures: Carotid Ultrasound Your physician has requested that you have a carotid duplex. This test is an ultrasound of the carotid arteries in your neck. It looks at blood flow through these arteries that supply the brain with blood. Allow one hour for this exam. There are no restrictions or special instructions.   Follow-Up: At [facility], you and your health needs are our priority.  As part of our continuing mission to provide you with exceptional heart care, we have created designated Provider Care Teams.  These Care Teams include your primary Cardiologist (physician) and Advanced Practice Providers (APPs -  Physician Assistants and Nurse Practitioners) who all work together to provide you with the care you need, when you need it.   Your next appointment:   6 month(s)  The format for your next appointment:   In Person  Provider:   [facility], MD       Important Information About Sugar

## 2022-11-30 ENCOUNTER — Ambulatory Visit (HOSPITAL_COMMUNITY)
Admission: RE | Admit: 2022-11-30 | Discharge: 2022-11-30 | Disposition: A | Discharge status: Home / Self Care | Payer: Medicare Other | Source: Ambulatory Visit | Attending: Cardiovascular Disease | Admitting: Cardiovascular Disease

## 2022-11-30 DIAGNOSIS — I2510 Atherosclerotic heart disease of native coronary artery without angina pectoris: Secondary | ICD-10-CM | POA: Diagnosis not present

## 2022-11-30 DIAGNOSIS — R0989 Other specified symptoms and signs involving the circulatory and respiratory systems: Secondary | ICD-10-CM | POA: Diagnosis present

## 2022-11-30 DIAGNOSIS — I5042 Chronic combined systolic (congestive) and diastolic (congestive) heart failure: Secondary | ICD-10-CM | POA: Diagnosis not present

## 2022-11-30 DIAGNOSIS — E782 Mixed hyperlipidemia: Secondary | ICD-10-CM | POA: Diagnosis not present

## 2022-12-13 ENCOUNTER — Encounter: Payer: Self-pay | Admitting: Internal Medicine

## 2022-12-13 ENCOUNTER — Ambulatory Visit: Payer: Medicare Other | Attending: Internal Medicine | Admitting: Internal Medicine

## 2022-12-13 VITALS — BP 110/64 | HR 70 | Ht 72.0 in | Wt 177.0 lb

## 2022-12-13 DIAGNOSIS — I493 Ventricular premature depolarization: Secondary | ICD-10-CM

## 2022-12-13 LAB — SEDIMENTATION RATE: Sed Rate: 34 mm/hr — ABNORMAL HIGH (ref 0–30)

## 2022-12-13 MED ORDER — AMIODARONE HCL 200 MG PO TABS: ORAL_TABLET | ORAL | 3 refills | Status: DC

## 2022-12-13 NOTE — Progress Notes (Signed)
HPI Thomas [name] returns today for followup. He is a pleasant 82 yo man with a h/o symptomatic PVC's, and preserved LV function who I started on amiodarone now 2 years ago. He noted that about 2 weeks after starting, his PVC's resolved. He has done well in the interim though he was in the hosptial in December with sob. His dyspnea has resolved. He notes that he easily sunburns. No chest pain.   Allergies  Allergen Reactions   Hydrocodone Nausea And Vomiting and Other (See Comments)    Dizziness and upset stomach   Robaxin [Methocarbamol] Nausea And Vomiting   Roxicodone [Oxycodone] Nausea And Vomiting   Ultram [Tramadol] Nausea And Vomiting     Current Outpatient Medications  Medication Sig Dispense Refill   acetaminophen (TYLENOL) 500 MG tablet Take 500 mg by mouth daily as needed for moderate pain.     amiodarone (PACERONE) 200 MG tablet Take 1/2 tablet (100 mg) by mo;uth daily 45 tablet 3   aspirin EC 81 MG tablet Take 1 tablet (81 mg total) by mouth daily.     azelastine (ASTELIN) 0.1 % nasal spray Place 1 spray into both nostrils daily as needed for rhinitis. Use in each nostril as directed     clopidogrel (PLAVIX) 75 MG tablet Take 1 tablet (75 mg total) by mouth daily with breakfast. 90 tablet 3   furosemide (LASIX) 20 MG tablet Take 1 tablet by mouth three times per week on Mondays, Wednesdays, and Fridays 36 tablet 3   levothyroxine (SYNTHROID, LEVOTHROID) 150 MCG tablet Take 150 mcg by mouth daily.     rosuvastatin (CRESTOR) 10 MG tablet Take 10 mg by mouth daily.     sacubitril-valsartan (ENTRESTO) 24-26 MG Take 1 tablet by mouth 2 (two) times daily. 60 tablet 11   No current facility-administered medications for this visit.     Past Medical History:  Diagnosis Date   Arthritis    Bifascicular bundle branch block    Bigeminy 06/2019   Coronary artery disease    takes Plavix and ASA daily   GERD (gastroesophageal reflux disease)    occasional and will take Tums if  needed   History of blood clots 2008   in chest   History of kidney stones late 90's   Hyperlipidemia    takes Simvastatin daily   Hypertension    takes Maxzide daily   Hypothyroidism    takes Synthroid daily   Joint pain    Joint swelling    left knee   Pneumonia    at 82 years old, double    ROS:   All systems reviewed and negative except as noted in the HPI.   Past Surgical History:  Procedure Laterality Date   ARTERY BIOPSY Left 12/15/2021   Procedure: LEFT TEMPORAL ARTERY BIOPSY;  Surgeon: Cherre Robins, MD;  Location: [facility];  Service: Vascular;  Laterality: Left;   CARDIAC CATHETERIZATION     CHOLECYSTECTOMY N/A 06/06/2018   Procedure: LAPAROSCOPIC CHOLECYSTECTOMY WITH INTRAOPERATIVE CHOLANGIOGRAM;  Surgeon: Donnie Mesa, MD;  Location: [facility];  Service: General;  Laterality: N/A;   CORONARY ANGIOPLASTY  05/21/2007   lad stent/taxus stent   CYSTOSCOPY WITH RETROGRADE PYELOGRAM, URETEROSCOPY AND STENT PLACEMENT Right 03/29/2022   Procedure: CYSTOSCOPY WITH RETROGRADE PYELOGRAM AND STENT PLACEMENT;  Surgeon: Lucas Mallow, MD;  Location: [facility];  Service: Urology;  Laterality: Right;   CYSTOSCOPY/URETEROSCOPY/HOLMIUM LASER/STENT PLACEMENT Right 04/18/2022   Procedure: CYSTOSCOPY\ RIGHT URETEROSCOPY/STONE BASKET EXTRACTION/STENT PLACEMENT;  Surgeon: Lucas Mallow, MD;  Location: [facility];  Service: Urology;  Laterality: Right;  60 MINS FOR CASE   HEMORROIDECTOMY  late 1970's   HERNIA REPAIR Right 1980's   RIGHT/LEFT HEART CATH AND CORONARY ANGIOGRAPHY N/A 10/26/2022   Procedure: RIGHT/LEFT HEART CATH AND CORONARY ANGIOGRAPHY;  Surgeon: Leonie Man, MD;  Location: [facility] CV LAB;  Service: Cardiovascular;  Laterality: N/A;   TOTAL HIP ARTHROPLASTY Right 10/28/2013   Procedure: RIGHT TOTAL HIP ARTHROPLASTY ANTERIOR APPROACH;  Surgeon: Gearlean Alf, MD;  Location: [facility];  Service: Orthopedics;  Laterality: Right;   TOTAL HIP ARTHROPLASTY Left 05/08/2015    Procedure: LEFT TOTAL HIP ARTHROPLASTY ANTERIOR APPROACH;  Surgeon: Gaynelle Arabian, MD;  Location: [place] [facility];  Service: Orthopedics;  Laterality: Left;     Family History  Problem Relation Age of Onset   CAD Mother    Heart failure Mother    Pneumonia Father    Heart failure Father      Social History   Socioeconomic History   Marital status: Married    Spouse name: Not on file   Number of children: Not on file   Years of education: Not on file   Highest education level: Not on file  Occupational History   Not on file  Tobacco Use   Smoking status: Former    Packs/day: 0.25    Years: 52.00    Total pack years: 13.00    Types: Cigarettes    Quit date: 2016    Years since quitting: 8.0   Smokeless tobacco: Never   Tobacco comments:    trying to quit  Vaping Use   Vaping Use: Never used  Substance and Sexual Activity   Alcohol use: Not Currently   Drug use: No   Sexual activity: Not on file  Other Topics Concern   Not on file  Social History Narrative   Not on file   Social Determinants of Health   Financial Resource Strain: Not on file  Food Insecurity: No Food Insecurity (10/23/2022)   Hunger Vital Sign    Worried About Running Out of Food in the Last Year: Never true    [place] in the Last Year: Never true  Transportation Needs: No Transportation Needs (10/23/2022)   PRAPARE - Hydrologist (Medical): No    Lack of Transportation (Non-Medical): No  Physical Activity: Not on file  Stress: Not on file  Social Connections: Not on file  Intimate Partner Violence: Not At Risk (10/23/2022)   Humiliation, Afraid, Rape, and Kick questionnaire    Fear of Current or Ex-Partner: No    Emotionally Abused: No    Physically Abused: No    Sexually Abused: No     BP 110/64   Pulse 70   Ht 6' (1.829 m)   Wt 177 lb (80.3 kg)   SpO2 97%   BMI 24.01 kg/m   Physical Exam:  Well appearing 82 yo man, NAD HEENT: Unremarkable Neck:   No JVD, no thyromegally Lymphatics:  No adenopathy Back:  No CVA tenderness Lungs:  Clear with no wheezes HEART:  Regular rate rhythm, no murmurs, no rubs, no clicks Abd:  soft, positive bowel sounds, no organomegally, no rebound, no guarding Ext:  2 plus pulses, no edema, no cyanosis, no clubbing Skin:  No rashes no nodules Neuro:  CN II through XII intact, motor grossly intact   DEVICE  Normal device function.  See PaceArt  for details.   Assess/Plan: PVC's- I have asked him to decrease the amio to 200 mg daily, none on Sat or Sun. He will check an ESR today. His TSH and liver were normal in November. CAD - his left heart cath was reassuring in December. Dyspnea - resolved.  Carleene Overlie Abriana Saltos,MD

## 2022-12-13 NOTE — Patient Instructions (Signed)
Medication Instructions:  Your physician has recommended you make the following change in your medication:  1) CHANGE your amiodarone to 1 tablet (200 mg) daily, Monday through Friday *If you need a refill on your cardiac medications before your next appointment, please call your pharmacy*   Lab Work: TODAY: Sed rate If you have labs (blood work) drawn today and your tests are completely normal, you will receive your results only by: [place] (if you have MyChart) OR A paper copy in the mail If you have any lab test that is abnormal or we need to change your treatment, we will call you to review the results.  Follow-Up: At [facility], you and your health needs are our priority.  As part of our continuing mission to provide you with exceptional heart care, we have created designated Provider Care Teams.  These Care Teams include your primary Cardiologist (physician) and Advanced Practice Providers (APPs -  Physician Assistants and Nurse Practitioners) who all work together to provide you with the care you need, when you need it.  Your next appointment:   1 year(s)  The format for your next appointment:   In Person  Provider:   You may see Cristopher Peru, MD or one of the following Advanced Practice Providers on your designated Care Team:   Tommye Standard, [place] Legrand Como "Jonni Sanger" Chalmers Cater, [place]    Important Information About Sugar

## 2022-12-15 ENCOUNTER — Telehealth: Payer: Self-pay

## 2022-12-15 DIAGNOSIS — I493 Ventricular premature depolarization: Secondary | ICD-10-CM

## 2022-12-15 DIAGNOSIS — R899 Unspecified abnormal finding in specimens from other organs, systems and tissues: Secondary | ICD-10-CM

## 2022-12-15 NOTE — Telephone Encounter (Signed)
Pt called and shared Sed Rate results with patient.    Pt chose 02/13/2023 at 830 am for a repeat lab draw.    Pt was educated on what the Sed Rate is, and why we are reassessing this.  No follow up required.

## 2022-12-15 NOTE — Telephone Encounter (Signed)
-----   Message from Evans Lance, MD sent at 12/13/2022  8:54 PM EST ----- Repeat the sed rate in 3 months.

## 2023-01-13 ENCOUNTER — Other Ambulatory Visit: Payer: Medicare Other

## 2023-01-16 ENCOUNTER — Ambulatory Visit: Payer: Medicare Other | Attending: Cardiovascular Disease

## 2023-01-17 LAB — BASIC METABOLIC PANEL
BUN/Creatinine Ratio: 13 (ref 10–24)
BUN: 18 mg/dL (ref 8–27)
CO2: 24 mmol/L (ref 20–29)
Calcium: 9 mg/dL (ref 8.6–10.2)
Chloride: 107 mmol/L — ABNORMAL HIGH (ref 96–106)
Creatinine, Ser: 1.44 mg/dL — ABNORMAL HIGH (ref 0.76–1.27)
Glucose: 100 mg/dL — ABNORMAL HIGH (ref 70–99)
Potassium: 3.6 mmol/L (ref 3.5–5.2)
Sodium: 145 mmol/L — ABNORMAL HIGH (ref 134–144)
eGFR: 49 mL/min/{1.73_m2} — ABNORMAL LOW (ref >59)

## 2023-02-02 ENCOUNTER — Ambulatory Visit (HOSPITAL_COMMUNITY)
Admission: RE | Admit: 2023-02-02 | Discharge: 2023-02-02 | Disposition: A | Discharge status: Home / Self Care | Payer: Medicare Other | Source: Ambulatory Visit | Attending: Cardiovascular Disease | Admitting: Cardiovascular Disease

## 2023-02-02 DIAGNOSIS — R911 Solitary pulmonary nodule: Secondary | ICD-10-CM | POA: Diagnosis not present

## 2023-02-13 ENCOUNTER — Ambulatory Visit: Payer: Medicare Other | Attending: Internal Medicine

## 2023-02-13 DIAGNOSIS — R899 Unspecified abnormal finding in specimens from other organs, systems and tissues: Secondary | ICD-10-CM

## 2023-02-13 DIAGNOSIS — I493 Ventricular premature depolarization: Secondary | ICD-10-CM

## 2023-02-14 LAB — SEDIMENTATION RATE: Sed Rate: 2 mm/hr (ref 0–30)

## 2023-02-15 ENCOUNTER — Telehealth: Payer: Self-pay

## 2023-02-15 NOTE — Telephone Encounter (Signed)
-----   Message from Evans Lance, MD sent at 02/14/2023  9:02 AM EST ----- Sed rate has normalized. No change in treatment.

## 2023-02-15 NOTE — Telephone Encounter (Signed)
Called Pt per message from Dr. Lovena Le.   Pt told Sed rate has normalized, and no changes in treatment at this time.  Pt understood education.   Pt wanted to know if Dr. Acie Fredrickson has seen his CT of his chest results yet?  Test was on 02/02/2023.   Pt told I will forward to Dr. Acie Fredrickson RN for follow up. Pt appreciated help from Dr. Acie Fredrickson and his RN.

## 2023-02-17 NOTE — Telephone Encounter (Signed)
[name], Thomas Cheng, MD  You21 hours ago (6:06 PM)   The CT san shows stable or improved lung nodules with no specific follow up required per radiology report  PN .   Returned call to patient to inform him of the above comments of MD. He has no further questions

## 2023-02-17 NOTE — Telephone Encounter (Signed)
Patient is calling back to get results from CT. Requesting call back for update.

## 2023-04-08 ENCOUNTER — Other Ambulatory Visit: Payer: Self-pay | Admitting: Cardiovascular Disease

## 2023-05-22 ENCOUNTER — Emergency Department (HOSPITAL_COMMUNITY)
Admission: EM | Admit: 2023-05-22 | Discharge: 2023-05-22 | Disposition: A | Discharge status: Home / Self Care | Payer: Medicare Other | Attending: Emergency Medicine | Admitting: Emergency Medicine

## 2023-05-22 ENCOUNTER — Other Ambulatory Visit: Payer: Self-pay

## 2023-05-22 DIAGNOSIS — S4991XA Unspecified injury of right shoulder and upper arm, initial encounter: Secondary | ICD-10-CM | POA: Insufficient documentation

## 2023-05-22 DIAGNOSIS — T148XXA Other injury of unspecified body region, initial encounter: Secondary | ICD-10-CM

## 2023-05-22 DIAGNOSIS — E039 Hypothyroidism, unspecified: Secondary | ICD-10-CM | POA: Insufficient documentation

## 2023-05-22 DIAGNOSIS — I2510 Atherosclerotic heart disease of native coronary artery without angina pectoris: Secondary | ICD-10-CM | POA: Diagnosis not present

## 2023-05-22 DIAGNOSIS — Z23 Encounter for immunization: Secondary | ICD-10-CM | POA: Insufficient documentation

## 2023-05-22 DIAGNOSIS — Z7901 Long term (current) use of anticoagulants: Secondary | ICD-10-CM | POA: Diagnosis not present

## 2023-05-22 DIAGNOSIS — W268XXA Contact with other sharp object(s), not elsewhere classified, initial encounter: Secondary | ICD-10-CM | POA: Diagnosis not present

## 2023-05-22 MED ORDER — TETANUS-DIPHTH-ACELL PERTUSSIS 5-2.5-18.5 LF-MCG/0.5 IM SUSY
0.5000 mL | PREFILLED_SYRINGE | Freq: Once | INTRAMUSCULAR | Status: AC
  Administered 2023-05-22: 0.5 mL via INTRAMUSCULAR
  Filled 2023-05-22: qty 0.5

## 2023-05-22 MED ORDER — BUPIVACAINE HCL (PF) 0.5 % IJ SOLN
10.0000 mL | Freq: Once | INTRAMUSCULAR | Status: DC
  Filled 2023-05-22: qty 30

## 2023-05-22 NOTE — ED Triage Notes (Signed)
Patient was unloading his lawn mower, got a piece cut his right elbow. Unknown when last tetanus shot was. Takes plavix.

## 2023-05-22 NOTE — ED Provider Triage Note (Signed)
Emergency Medicine Provider Triage Evaluation Note  Thomas [name] , a 82 y.o. male  was evaluated in triage.  Pt complains of lac to R bicep w blade of lawn mower.  Uncertain last tdap  10:30am  Review of Systems  Positive: R arm lac Negative: Fever   Physical Exam  BP 118/67 (BP Location: Left Arm)   Pulse 74   Temp (!) 97.4 F (36.3 C) (Oral)   Resp 18   SpO2 100%  Gen:   Awake, no distress   Resp:  Normal effort  MSK:   Moves extremities without difficulty  Other:  Laceration to R bicept  Medical Decision Making  Medically screening exam initiated at 12:15 PM.  Appropriate orders placed.  [name] was informed that the remainder of the evaluation will be completed by another provider, this initial triage assessment does not replace that evaluation, and the importance of remaining in the ED until their evaluation is complete.  Tdap and orders   Gailen Shelter, [place] 05/22/23 1218

## 2023-05-22 NOTE — Discharge Instructions (Addendum)
The wound on your forearm will need to be covered to keep it from getting dirty.  Keep clean with warm soap and water and dab dry if it ever gets dirty.  Otherwise keep covered with either the petroleum gauze that we have given you which is yellowish and somewhat slimy or covered with Vaseline and a nonstick bandage.  New skin will grow but will take some time.  Tylenol 1000 mg every 6 hours as needed for discomfort.  Follow-up with your primary care doctor for continued wound care and observation of this wound

## 2023-05-22 NOTE — ED Provider Notes (Signed)
[place] EMERGENCY DEPARTMENT AT [facility] Provider Note   CSN: [id number] Arrival date & time: 05/22/23  1157     History {Add pertinent medical, surgical, social history, OB history to HPI:1} Chief Complaint  Patient presents with  . Laceration    Thomas [name] is a 82 y.o. male.   Laceration         Home Medications Prior to Admission medications   Medication Sig Start Date End Date Taking? Authorizing Provider  acetaminophen (TYLENOL) 500 MG tablet Take 500 mg by mouth daily as needed for moderate pain.    [provider]  amiodarone (PACERONE) 200 MG tablet Take 1 tablet (200 mg total) once daily Monday through Friday 12/13/22   Marinus Maw, MD  aspirin EC 81 MG tablet Take 1 tablet (81 mg total) by mouth daily. 04/02/18   Nahser, Deloris Ping, MD  azelastine (ASTELIN) 0.1 % nasal spray Place 1 spray into both nostrils daily as needed for rhinitis. Use in each nostril as directed    [provider]  clopidogrel (PLAVIX) 75 MG tablet TAKE 1 TABLET BY MOUTH DAILY WITH BREAKFAST 04/10/23   Nahser, Deloris Ping, MD  furosemide (LASIX) 20 MG tablet Take 1 tablet by mouth three times per week on Mondays, Wednesdays, and Fridays 11/25/22   Nahser, Deloris Ping, MD  levothyroxine (SYNTHROID, LEVOTHROID) 150 MCG tablet Take 150 mcg by mouth daily. 01/04/18   [provider]  rosuvastatin (CRESTOR) 10 MG tablet Take 10 mg by mouth daily.    [provider]  sacubitril-valsartan (ENTRESTO) 24-26 MG Take 1 tablet by mouth 2 (two) times daily. 11/01/22   Nahser, Deloris Ping, MD      Allergies    Hydrocodone, Robaxin [methocarbamol], Roxicodone [oxycodone], and Ultram [tramadol]    Review of Systems   Review of Systems  Physical Exam Updated Vital Signs BP 118/67 (BP Location: Left Arm)   Pulse 74   Temp (!) 97.4 F (36.3 C) (Oral)   Resp 18   Ht 6' (1.829 m)   Wt 86.2 kg   SpO2 100%   BMI 25.77 kg/m  Physical Exam  ED Results /  Procedures / Treatments   Labs (all labs ordered are listed, but only abnormal results are displayed) Labs Reviewed - No data to display  EKG None  Radiology No results found.  Procedures Procedures  {Document cardiac monitor, telemetry assessment procedure when appropriate:1}  Medications Ordered in ED Medications  bupivacaine(PF) (MARCAINE) 0.5 % injection 10 mL (has no administration in time range)  Tdap (BOOSTRIX) injection 0.5 mL (has no administration in time range)    ED Course/ Medical Decision Making/ A&P   {   Click here for ABCD2, HEART and other calculatorsREFRESH Note before signing :1}                          Medical Decision Making Risk Prescription drug management.   ***  {Document critical care time when appropriate:1} {Document review of labs and clinical decision tools ie heart score, Chads2Vasc2 etc:1}  {Document your independent review of radiology images, and any outside records:1} {Document your discussion with family members, caretakers, and with consultants:1} {Document social determinants of health affecting pt's care:1} {Document your decision making why or why not admission, treatments were needed:1} Final Clinical Impression(s) / ED Diagnoses Final diagnoses:  None    Rx / DC Orders ED Discharge Orders     None

## 2023-05-22 NOTE — ED Notes (Signed)
Wound Care completed  

## 2023-05-29 ENCOUNTER — Other Ambulatory Visit: Payer: Self-pay | Admitting: Medical

## 2023-05-29 DIAGNOSIS — M25551 Pain in right hip: Secondary | ICD-10-CM

## 2023-05-29 NOTE — Progress Notes (Unsigned)
Cardiology Office Note   Date:  05/30/2023   ID:  Thomas [name], Thomas [name] [birth year], MRN [id number]  PCP:  Kaleen Mask, MD  Cardiologist:   Kristeen Miss, MD   No chief complaint on file.  Problem list: 1. Coronary artery disease-has a stent in his left anterior descending artery. The proximal edge of the stent hangs out into the left main. He's had thrombus formation on the proximal edge of the stent that embolized down the left circumflex artery when he was off Plavix. Because of this we have kept him on Plavix lifelong   2. Essential hypertension -    3. Hyperlipidemia-   Thomas [name] is a 82 year old gentleman with a history of coronary artery disease. He is status post PTCA and stenting of his left anterior descending artery. He's had thrombus formation on the proximal edge of the stent which extended out over the left circumflex artery. He's been on Plavix since that time.  He complains of some hip pain.  He still smokes a few cigarettes a day.  February 26, 2014:  Since I last saw him 1 1/2 years ago, he has had hip surgery .    I had recommneded that he not stop his plavix.  Dr. Despina Hick opperated on him ON plavix and had no significant bleeding episodes.    He has not had any CP   February 26, 2015 Thomas [name] is a 82 y.o. male who presents for pre op visit for left hip replacement.  He had his last hip replacment WHILE on full dose Plavix and had no bleeding .  He has no CP or dyspnea.  He is limited by his hip problems.   February 29, 2016:  Doing well.   Had another hip replacement in May .  Had the surgery while on Plavix . No CP Stays active.  Walks a mile a day   February 28, 2017:  Thomas [name] is seen back today for f/u of his CAD No CP or dyspnea.  Exercising regularly   April 02, 2018:  Thomas [name] is seen today for follow up of his CAD , hypertension, hyperlipidemia. Walks some,  2 miles a day - perhaps twice a week ,  No CP o r dyspnea.   No dizziness.   He is on aspirin 325 mg a day.  We will have him decrease his dose to 81 mg a day. Also refill his nitroglycerin.   Oct. 2, 2020   Thomas [name] is seen today for follow-up of his coronary artery disease, hypertension and hyperlipidemia.  HR has been slow for the past month or so. Has been in bigeminy. ( so his reported slow HR is actually bigeminy PVCs)    has had shortness of breath - more dyspnea over the past month  Echo shows normal LV systolic function with EF 60-65% .   Could not assess diastolic function   He has CAD - his symptoms were just dyspnea.   Has never had any angina .   Was recently admitted for near syncope thought to be  volume depletion   Jan. 12, 2021: Seen for follow up of his CAD , HTN, hyperllipidemia  Had bigeminy and we set him up to see Dr. Ladona Ridgel. He was stated on amiodarone which has helped .  Is sleeping better   Jan. 20, 2022 Thomas [name] is seen today for follow up of his CAD, HTN, HLD  He is on lifelong plavix Was walking regularly until this most  recent snow / ice No angina  I reviewed labs from Dr. Jeannetta [name] office.  His total cholesterol is 126 Triglyceride level is 78 HDL is 55 LDL is 56. His labs look good.  We will continue with current medications.  April 04, 2022:  Thomas [name] is seen today for a follow-up visit.[name] He needs to have a lithotripsy. He is on lifelong plavix    Nov. 21, 2023 Thomas [name] is seen today for follow up Was hospitalized with acute on chronic CHF, increasing dyspnea  Hx of CAD  Had a cath recently: normal R heart pressures, coronaries were stable  Echocardiogram on October 23, 2022 reveals LVEF of 40 to 45%.  He has grade 2 diastolic dysfunction. He has severely elevated pulmonary pressures with an estimated PA pressure of 67 mmHg. Mild mitral regurgitation.  Trivial aortic insufficiency.   Will start Entresto 24-26 BID  Reduce lasix to 20 mg on M,W,F  Pt may drive   Will see him On Dec. 15, with BMP     Dec. 15,2023   Thomas [name] is seen for follow up Hx of CAD Chronic CHF, increasing dyspnea We started entresto at his last visit , is tolerating the Entresto well  Weight has been steady   May 30, 2023  Thomas [name] is seen for follow up of his CAD, CHF,  No CP , no dyspnea  Had some difficulty unloading his lawnmower and was drug 10-20 feet while trying to keep it from rolling down the hill   Is walking regularly  Has cut his bologna and hot dogs   Had his lipids checked yesterday at Dr. Jeannetta [name] office  He will have Dr. Jeannetta [name] fax over his labs when they are available     Past Medical History:  Diagnosis Date   Arthritis    Bifascicular bundle branch block    Bigeminy 06/2019   Coronary artery disease    takes Plavix and ASA daily   GERD (gastroesophageal reflux disease)    occasional and will take Tums if needed   History of blood clots 2008   in chest   History of kidney stones late 90's   Hyperlipidemia    takes Simvastatin daily   Hypertension    takes Maxzide daily   Hypothyroidism    takes Synthroid daily   Joint pain    Joint swelling    left knee   Pneumonia    at 82 years old, double    Past Surgical History:  Procedure Laterality Date   ARTERY BIOPSY Left 12/15/2021   Procedure: LEFT TEMPORAL ARTERY BIOPSY;  Surgeon: Leonie Douglas, MD;  Location: [facility] OR;  Service: Vascular;  Laterality: Left;   CARDIAC CATHETERIZATION     CHOLECYSTECTOMY N/A 06/06/2018   Procedure: LAPAROSCOPIC CHOLECYSTECTOMY WITH INTRAOPERATIVE CHOLANGIOGRAM;  Surgeon: Manus Rudd, MD;  Location: [facility] OR;  Service: General;  Laterality: N/A;   CORONARY ANGIOPLASTY  05/21/2007   lad stent/taxus stent   CYSTOSCOPY WITH RETROGRADE PYELOGRAM, URETEROSCOPY AND STENT PLACEMENT Right 03/29/2022   Procedure: CYSTOSCOPY WITH RETROGRADE PYELOGRAM AND STENT PLACEMENT;  Surgeon: Crista Elliot, MD;  Location: [facility] ORS;  Service: Urology;  Laterality: Right;   CYSTOSCOPY/URETEROSCOPY/HOLMIUM  LASER/STENT PLACEMENT Right 04/18/2022   Procedure: CYSTOSCOPY\ RIGHT URETEROSCOPY/STONE BASKET EXTRACTION/STENT PLACEMENT;  Surgeon: Crista Elliot, MD;  Location: [facility] ORS;  Service: Urology;  Laterality: Right;  60 MINS FOR CASE   HEMORROIDECTOMY  late 1970's   HERNIA REPAIR Right 1980's   RIGHT/LEFT HEART CATH AND CORONARY ANGIOGRAPHY  N/A 10/26/2022   Procedure: RIGHT/LEFT HEART CATH AND CORONARY ANGIOGRAPHY;  Surgeon: Marykay Lex, MD;  Location[facility] INVASIVE CV LAB;  Service: Cardiovascular;  Laterality: N/A;   TOTAL HIP ARTHROPLASTY Right 10/28/2013   Procedure: RIGHT TOTAL HIP ARTHROPLASTY ANTERIOR APPROACH;  Surgeon: Loanne Drilling, MD;  Location: [facility] ORS;  Service: Orthopedics;  Laterality: Right;   TOTAL HIP ARTHROPLASTY Left 05/08/2015   Procedure: LEFT TOTAL HIP ARTHROPLASTY ANTERIOR APPROACH;  Surgeon: Ollen Gross, MD;  Location: [facility] OR;  Service: Orthopedics;  Laterality: Left;     Current Outpatient Medications  Medication Sig Dispense Refill   acetaminophen (TYLENOL) 500 MG tablet Take 500 mg by mouth daily as needed for moderate pain.     amiodarone (PACERONE) 200 MG tablet Take 1 tablet (200 mg total) once daily Monday through Friday 80 tablet 3   aspirin EC 81 MG tablet Take 1 tablet (81 mg total) by mouth daily.     azelastine (ASTELIN) 0.1 % nasal spray Place 1 spray into both nostrils daily as needed for rhinitis. Use in each nostril as directed     clopidogrel (PLAVIX) 75 MG tablet TAKE 1 TABLET BY MOUTH DAILY WITH BREAKFAST 90 tablet 3   furosemide (LASIX) 20 MG tablet Take 1 tablet by mouth three times per week on Mondays, Wednesdays, and Fridays 36 tablet 3   levothyroxine (SYNTHROID, LEVOTHROID) 150 MCG tablet Take 150 mcg by mouth daily.     rosuvastatin (CRESTOR) 10 MG tablet Take 10 mg by mouth daily.     sacubitril-valsartan (ENTRESTO) 24-26 MG Take 1 tablet by mouth 2 (two) times daily. 60 tablet 11   No current facility-administered medications for this  visit.    Allergies:   Hydrocodone, Robaxin [methocarbamol], Roxicodone [oxycodone], and Ultram [tramadol]    Social History:  The patient  reports that he quit smoking about 8 years ago. His smoking use included cigarettes. He has a 13.00 pack-year smoking history. He has never used smokeless tobacco. He reports that he does not currently use alcohol. He reports that he does not use drugs.   Family History:  The patient's family history includes CAD in his mother; Heart failure in his father and mother; Pneumonia in his father.    ROS:  Noted in current history, otherwise review of systems is negative.  Physical Exam: Blood pressure 120/70, pulse 61, height 6' (1.829 m), weight 198 lb (89.8 kg), SpO2 97 %.       GEN:  Well nourished, well developed in no acute distress HEENT: Normal NECK: No JVD; No carotid bruits LYMPHATICS: No lymphadenopathy CARDIAC: RRR , no murmurs, rubs, gallops RESPIRATORY:  Clear to auscultation without rales, wheezing or rhonchi  ABDOMEN: Soft, non-tender, non-distended MUSCULOSKELETAL:  No edema; No deformity  SKIN: Warm and dry NEUROLOGIC:  Alert and oriented x 3   EKG:    .  Recent Labs: 10/23/2022: TSH 2.203 10/24/2022: ALT 14; B Natriuretic Peptide 1,430.4; Magnesium 2.4 10/26/2022: Hemoglobin 12.9; Hemoglobin 12.9; Platelets 231 01/16/2023: BUN 18; Creatinine, Ser 1.44; Potassium 3.6; Sodium 145    Lipid Panel    Component Value Date/Time   CHOL 130 10/26/2022 0327   CHOL 109 04/02/2018 0919   TRIG 77 10/26/2022 0327   HDL 35 (L) 10/26/2022 0327   HDL 35 (L) 04/02/2018 0919   CHOLHDL 3.7 10/26/2022 0327   VLDL 15 10/26/2022 0327   LDLCALC 80 10/26/2022 0327   LDLCALC 54 04/02/2018 0919      Wt Readings from Last  3 Encounters:  05/30/23 198 lb (89.8 kg)  05/22/23 190 lb (86.2 kg)  12/13/22 177 lb (80.3 kg)      Other studies Reviewed: Additional studies/ records that were reviewed today include: . Review of the above  records demonstrates:    ASSESSMENT AND PLAN:  1. Coronary artery disease-  stable,  no angina .   Continue current meds     2. Chronic combined CHF :  no dyspnea    2. Essential hypertension  -  BP is well controlled.    3. Hyperlipidemia - lipids were checked at Dr. Jeannetta [name] office yesterday .       4.  Kidney stones:    Current medicines are reviewed at length with the patient today.  The patient does not have concerns regarding medicines.  The following changes have been made:  no change  Labs/ tests ordered today include:   No orders of the defined types were placed in this encounter.    Disposition:   will see him in 6 months    Signed, Kristeen Miss, MD  05/30/2023 9:56 AM    [facility] [address] [place], [place], [place]  [address] Phone: [phone]; Fax: [phone]

## 2023-05-30 ENCOUNTER — Encounter: Payer: Self-pay | Admitting: Cardiovascular Disease

## 2023-05-30 ENCOUNTER — Ambulatory Visit: Payer: Medicare Other | Attending: Cardiovascular Disease | Admitting: Cardiovascular Disease

## 2023-05-30 ENCOUNTER — Ambulatory Visit
Admission: RE | Admit: 2023-05-30 | Discharge: 2023-05-30 | Disposition: A | Discharge status: Home / Self Care | Payer: Medicare Other | Source: Ambulatory Visit | Attending: Medical | Admitting: Medical

## 2023-05-30 VITALS — BP 120/70 | HR 61 | Ht 72.0 in | Wt 198.0 lb

## 2023-05-30 DIAGNOSIS — E782 Mixed hyperlipidemia: Secondary | ICD-10-CM

## 2023-05-30 DIAGNOSIS — M25551 Pain in right hip: Secondary | ICD-10-CM

## 2023-05-30 DIAGNOSIS — I2510 Atherosclerotic heart disease of native coronary artery without angina pectoris: Secondary | ICD-10-CM

## 2023-05-30 LAB — LAB REPORT - SCANNED: EGFR: 45

## 2023-05-30 NOTE — Patient Instructions (Signed)
Medication Instructions:  Your physician recommends that you continue on your current medications as directed. Please refer to the Current Medication list given to you today.  *If you need a refill on your cardiac medications before your next appointment, please call your pharmacy*   Lab Work: NONE If you have labs (blood work) drawn today and your tests are completely normal, you will receive your results only by: MyChart Message (if you have MyChart) OR A paper copy in the mail If you have any lab test that is abnormal or we need to change your treatment, we will call you to review the results.   Testing/Procedures: NONE   Follow-Up: At [facility], you and your health needs are our priority.  As part of our continuing mission to provide you with exceptional heart care, we have created designated Provider Care Teams.  These Care Teams include your primary Cardiologist (physician) and Advanced Practice Providers (APPs -  Physician Assistants and Nurse Practitioners) who all work together to provide you with the care you need, when you need it.  Your next appointment:   1 year(s)  Provider:   Philip Nahser, MD      

## 2023-11-07 ENCOUNTER — Other Ambulatory Visit: Payer: Self-pay | Admitting: Cardiovascular Disease

## 2023-11-23 ENCOUNTER — Other Ambulatory Visit (HOSPITAL_COMMUNITY): Payer: Self-pay

## 2023-11-23 ENCOUNTER — Telehealth: Payer: Self-pay | Admitting: Cardiovascular Disease

## 2023-11-23 ENCOUNTER — Telehealth: Payer: Self-pay

## 2023-11-23 NOTE — Telephone Encounter (Signed)
Call to patient to explain that our patient assistance team is attempting to enroll him in a [employer] [place] program. Patient verifies he currently has 3 weeks of medication left. Forwarded to Dr. Harvie Bridge nurse and to patient assistance team.

## 2023-11-23 NOTE — Telephone Encounter (Signed)
Patient approved and enrolled in [employer] grant

## 2023-11-23 NOTE — Telephone Encounter (Signed)
Pt c/o medication issue:  1. Name of Medication:   sacubitril-valsartan (ENTRESTO) 24-26 MG    2. How are you currently taking this medication (dosage and times per day)?    3. Are you having a reaction (difficulty breathing--STAT)? no  4. What is your medication issue? Patient states that he can not afford this medication, calling to see what other options there are. Please advise

## 2023-11-23 NOTE — Telephone Encounter (Signed)
Patient Advocate Encounter   The patient was approved for a [employer] grant that will help cover the cost of ENTRESTO Total amount awarded, $10,000.  Effective: 10/24/23 - 10/22/24   [id number] FIE:[id number] [id number]   Pharmacy provided with approval and processing information. Pharmacy will reach out to patient once grant is processed.   Haze Rushing, CPhT  Pharmacy Patient Advocate Specialist  Direct Number: [phone] Fax: [phone]

## 2023-11-24 NOTE — Telephone Encounter (Signed)
Pt made aware. No further questions. Very appreciative.

## 2023-11-24 NOTE — Telephone Encounter (Signed)
It's already active and approved. I faxed the instructions and billing info to his pharmacy yesterday.

## 2023-11-24 NOTE — Telephone Encounter (Signed)
Called and spoke with patient and made him aware of grant information. He is very Adult nurse.

## 2024-01-03 ENCOUNTER — Telehealth: Payer: Self-pay | Admitting: Cardiovascular Disease

## 2024-01-03 NOTE — Telephone Encounter (Signed)
Spoke with daughter and she would like patient assistance for his Entresto. States patient can not afford it.

## 2024-01-03 NOTE — Telephone Encounter (Signed)
Pt c/o medication issue:  1. Name of Medication:   sacubitril-valsartan (ENTRESTO) 24-26 MG    2. How are you currently taking this medication (dosage and times per day)?   3. Are you having a reaction (difficulty breathing--STAT)?   4. What is your medication issue? Patient's daughter states that patient is in the donut hole for this medication and cannot currently afford the refill. Requesting call back to discuss options. Please advise.

## 2024-01-04 NOTE — Telephone Encounter (Signed)
Patient was already approved for a grant and made aware of assistance. Grant information and billing instructions have been refaxed to the pharmacy.

## 2024-01-08 NOTE — Telephone Encounter (Signed)
Returned call to patient's daughter Toniann Fail and informed her our patient assistance team has refaxed [employer] information for Sherryll Burger to patient's pharmacy ([facility]) as of 01/04/24.  Grant effective from 10/24/2023 to 10/22/2024.  Toniann Fail verbalized understanding and expressed appreciation for call.

## 2024-01-08 NOTE — Telephone Encounter (Signed)
Daughter is following up. Please clarify which pharmacy will have prescription.

## 2024-02-04 ENCOUNTER — Emergency Department (HOSPITAL_COMMUNITY): Payer: Medicare Other

## 2024-02-04 ENCOUNTER — Other Ambulatory Visit: Payer: Self-pay

## 2024-02-04 ENCOUNTER — Emergency Department (HOSPITAL_COMMUNITY)
Admission: EM | Admit: 2024-02-04 | Discharge: 2024-02-04 | Disposition: A | Discharge status: Home / Self Care | Payer: Medicare Other | Attending: Emergency Medicine | Admitting: Emergency Medicine

## 2024-02-04 DIAGNOSIS — Z79899 Other long term (current) drug therapy: Secondary | ICD-10-CM | POA: Insufficient documentation

## 2024-02-04 DIAGNOSIS — E785 Hyperlipidemia, unspecified: Secondary | ICD-10-CM | POA: Diagnosis not present

## 2024-02-04 DIAGNOSIS — R0789 Other chest pain: Secondary | ICD-10-CM | POA: Diagnosis not present

## 2024-02-04 DIAGNOSIS — I4510 Unspecified right bundle-branch block: Secondary | ICD-10-CM | POA: Diagnosis not present

## 2024-02-04 DIAGNOSIS — I493 Ventricular premature depolarization: Secondary | ICD-10-CM | POA: Diagnosis not present

## 2024-02-04 DIAGNOSIS — E782 Mixed hyperlipidemia: Secondary | ICD-10-CM

## 2024-02-04 DIAGNOSIS — R0602 Shortness of breath: Secondary | ICD-10-CM | POA: Diagnosis not present

## 2024-02-04 DIAGNOSIS — I2510 Atherosclerotic heart disease of native coronary artery without angina pectoris: Secondary | ICD-10-CM | POA: Diagnosis not present

## 2024-02-04 DIAGNOSIS — R079 Chest pain, unspecified: Secondary | ICD-10-CM | POA: Diagnosis present

## 2024-02-04 DIAGNOSIS — J069 Acute upper respiratory infection, unspecified: Secondary | ICD-10-CM | POA: Diagnosis not present

## 2024-02-04 DIAGNOSIS — I480 Paroxysmal atrial fibrillation: Secondary | ICD-10-CM

## 2024-02-04 DIAGNOSIS — I10 Essential (primary) hypertension: Secondary | ICD-10-CM

## 2024-02-04 DIAGNOSIS — I4891 Unspecified atrial fibrillation: Secondary | ICD-10-CM

## 2024-02-04 DIAGNOSIS — Z7982 Long term (current) use of aspirin: Secondary | ICD-10-CM | POA: Insufficient documentation

## 2024-02-04 HISTORY — DX: Unspecified atrial fibrillation: I48.91

## 2024-02-04 LAB — BASIC METABOLIC PANEL
Anion gap: 10 (ref 5–15)
BUN: 20 mg/dL (ref 8–23)
CO2: 23 mmol/L (ref 22–32)
Calcium: 8.8 mg/dL — ABNORMAL LOW (ref 8.9–10.3)
Chloride: 105 mmol/L (ref 98–111)
Creatinine, Ser: 1.11 mg/dL (ref 0.61–1.24)
GFR, Estimated: >60 mL/min (ref >60)
Glucose, Bld: 106 mg/dL — ABNORMAL HIGH (ref 70–99)
Potassium: 3.6 mmol/L (ref 3.5–5.1)
Sodium: 138 mmol/L (ref 135–145)

## 2024-02-04 LAB — CBC
HCT: 38.5 % — ABNORMAL LOW (ref 39.0–52.0)
Hemoglobin: 12.7 g/dL — ABNORMAL LOW (ref 13.0–17.0)
MCH: 34.7 pg — ABNORMAL HIGH (ref 26.0–34.0)
MCHC: 33 g/dL (ref 30.0–36.0)
MCV: 105.2 fL — ABNORMAL HIGH (ref 80.0–100.0)
Platelets: 209 10*3/uL (ref 150–400)
RBC: 3.66 MIL/uL — ABNORMAL LOW (ref 4.22–5.81)
RDW: 15.1 % (ref 11.5–15.5)
WBC: 6.3 10*3/uL (ref 4.0–10.5)
nRBC: 0 % (ref 0.0–0.2)

## 2024-02-04 LAB — TROPONIN I (HIGH SENSITIVITY)
Troponin I (High Sensitivity): 12 ng/L (ref <18)
Troponin I (High Sensitivity): 13 ng/L (ref <18)

## 2024-02-04 LAB — BRAIN NATRIURETIC PEPTIDE: B Natriuretic Peptide: 791.2 pg/mL — ABNORMAL HIGH (ref 0.0–100.0)

## 2024-02-04 MED ORDER — APIXABAN 5 MG PO TABS: 5.0000 mg | ORAL_TABLET | Freq: Two times a day (BID) | ORAL | 0 refills | Status: DC

## 2024-02-04 MED ORDER — SODIUM CHLORIDE 0.9 % IV BOLUS
250.0000 mL | Freq: Once | INTRAVENOUS | Status: AC
  Administered 2024-02-04: 250 mL via INTRAVENOUS

## 2024-02-04 NOTE — ED Provider Notes (Signed)
 Patient with new A-fib rate controlled he is on amiodarone.  He is on Plavix.  Talked with Dr. Odis Hollingshead.  Overall he is having some chest discomfort associated with coughing.  Normal troponins.  Unremarkable heart cath couple years ago.  Overall Dr. Fredderick Phenix and I agree both sounds noncardiac in nature.  Not have any active chest pain now.  Given the new A-fib will have him stop Plavix and start him on Eliquis and follow-up with cardiology.  Dr. Odis Hollingshead agrees with this plan.  Discharged in good condition.  Patient understands to return to the ED if he continues to have intermittent chest pain exertional chest pain or worsening symptoms.  This chart was dictated using voice recognition software.  Despite best efforts to proofread,  errors can occur which can change the documentation meaning.    Virgina Norfolk, DO 02/04/24 1650

## 2024-02-04 NOTE — Progress Notes (Signed)
 ON-CALL CARDIOLOGY 02/04/24  Patient's name: Thomas [name].   MRN: [id number].    DOB: Jul 24, 1941 Primary care provider: Kaleen Mask, MD. Primary cardiologist: Dr. Elease Hashimoto  Cc: Chest pain, cough, nausea, shortness of breath Reason of consult: New onset of atrial fibrillation Referring physician: Dr. Virgina Norfolk  Interaction regarding this patient's care today:  Emergency room department physician Dr. Virgina Norfolk reached out to review his case.  Patient establish with [facility] given his underlying CAD and heart failure presents to the hospital for the above-mentioned chief complaint.  Currently on antiplatelet therapy given his history of CAD.  Workup in the emergency room department notes new onset of atrial fibrillation with controlled ventricular rate. High sensitive troponins negative x 2.   BNP 791  I was informed that patient does not have a prior history of atrial fibrillation and patient does not know when the onset of A-fib was.  ED physician plans discharged home and would like further recommendations.  EKG: Independently reviewed 02/04/2024: Atrial fibrillation, controlled ventricular rate, 85 bpm, right bundle branch block, without underlying injury pattern.     CBC    Component Value Date/Time   WBC 6.3 02/04/2024 1132   RBC 3.66 (L) 02/04/2024 1132   HGB 12.7 (L) 02/04/2024 1132   HCT 38.5 (L) 02/04/2024 1132   PLT 209 02/04/2024 1132   MCV 105.2 (H) 02/04/2024 1132   MCH 34.7 (H) 02/04/2024 1132   MCHC 33.0 02/04/2024 1132   RDW 15.1 02/04/2024 1132   LYMPHSABS 2.0 03/24/2022 2246   MONOABS 0.9 03/24/2022 2246   EOSABS 0.1 03/24/2022 2246   BASOSABS 0.1 03/24/2022 2246   Lab Results  Component Value Date   NA 138 02/04/2024   K 3.6 02/04/2024   CO2 23 02/04/2024   GLUCOSE 106 (H) 02/04/2024   BUN 20 02/04/2024   CREATININE 1.11 02/04/2024   CALCIUM 8.8 (L) 02/04/2024   GFR 58.60 (L) 12/21/2011   EGFR 45.0 05/30/2023   GFRNONAA >60  02/04/2024   Cardiac Panel (last 3 results) Recent Labs    02/04/24 1132 02/04/24 1444  TROPONINIHS 12 13   Impression: New onset of atrial fibrillation with controlled ventricular rate. Coronary artery disease Hypertension. Hyperlipidemia  Recommendations: ED physician has interviewed the patient and patient has not had any history of gastrointestinal bleeding or intracranial bleeding.  I have informed that he is not at high risk for bleeding. I recommended that Dr. Lockie Mola discusses risks, benefits, and alternatives to oral anticoagulation as his CHA2DS2-VASc SCORE is greater than 2  If patient agreeable would agree initiating anticoagulation for thromboembolic prophylaxis.  To minimize risk of bleeding patient can stop Plavix.  His last heart catheterization was in November 2023 which noted patent stents.  No interventions were performed at that time.  I will defer which oral anticoagulant to use as per ED physicians discussion with the patient may be depending on cost and insurance coverage.  EKGs independently reviewed and noted above. Labs independently reviewed as noted above.  Patient already on amiodarone 200 mg p.o. daily -for PVC management as per Dr. Lubertha Basque last office note 12/13/2022.  Will defer management of side effect profile due to amiodarone to primary cardiology.  Will arrange outpatient follow-up with his primary cardiologist Dr. Elease Hashimoto or APP for further evaluation and management.  Telephone encounter total time: 16 minutes   Tessa Lerner, DO, [facility]  [address] [place], [place] [address] 02/04/2024 7:54 PM

## 2024-02-04 NOTE — ED Triage Notes (Signed)
 Patient to ED by POV wit c/o chest pain, cough, nausea and SOB. States he was recently diagnosed with the flu and is currently on ABX. He has HX of CHF.

## 2024-02-04 NOTE — ED Provider Notes (Signed)
 [place] EMERGENCY DEPARTMENT AT [facility] Provider Note   CSN: [id number] Arrival date & time: 02/04/24  1050     History  Chief Complaint  Patient presents with   Chest Pain    Thomas [name] is a 83 y.o. male.  Patient is a 83 year old male with a history of coronary artery disease, PVCs, hyperlipidemia who presents with cough and some chest pain.  He reports that he has had a cough and some congestion for about a week.  He has had some intermittent shortness of breath.  He felt a little short of breath this morning but not currently.  He has had some intermittent pain in his chest.  It seems to be worse with coughing.  He describes it as a burning in his chest.  He denies any leg swelling.  No known fevers.  Has had some nausea but no vomiting.  No exertional symptoms.  He was seen reportedly by his PCP and says he was started on antibiotics.  I asked him to show me the prescriptions and they did not see any for antibiotics.  He had a prescription for Lasix and potassium which he has been taking.       Home Medications Prior to Admission medications   Medication Sig Start Date End Date Taking? Authorizing Provider  acetaminophen (TYLENOL) 500 MG tablet Take 500 mg by mouth daily as needed for moderate pain.    [provider]  amiodarone (PACERONE) 200 MG tablet Take 1 tablet (200 mg total) once daily Monday through Friday 12/13/22   Marinus Maw, MD  aspirin EC 81 MG tablet Take 1 tablet (81 mg total) by mouth daily. 04/02/18   Nahser, Deloris Ping, MD  azelastine (ASTELIN) 0.1 % nasal spray Place 1 spray into both nostrils daily as needed for rhinitis. Use in each nostril as directed    [provider]  clopidogrel (PLAVIX) 75 MG tablet TAKE 1 TABLET BY MOUTH DAILY WITH BREAKFAST 04/10/23   Nahser, Deloris Ping, MD  furosemide (LASIX) 20 MG tablet Take 1 tablet by mouth three times per week on Mondays, Wednesdays, and Fridays 11/25/22   Nahser, Deloris Ping,  MD  levothyroxine (SYNTHROID, LEVOTHROID) 150 MCG tablet Take 150 mcg by mouth daily. 01/04/18   [provider]  rosuvastatin (CRESTOR) 10 MG tablet Take 10 mg by mouth daily.    [provider]  sacubitril-valsartan (ENTRESTO) 24-26 MG TAKE 1 TABLET BY MOUTH TWICE DAILY 11/08/23   Nahser, Deloris Ping, MD      Allergies    Hydrocodone, Robaxin [methocarbamol], Roxicodone [oxycodone], and Ultram [tramadol]    Review of Systems   Review of Systems  Physical Exam Updated Vital Signs BP (!) 144/83 (BP Location: Right Arm)   Pulse 81   Temp 98.5 F (36.9 C) (Oral)   Resp 18   Ht 6' (1.829 m)   Wt 85.7 kg   SpO2 100%   BMI 25.63 kg/m  Physical Exam  ED Results / Procedures / Treatments   Labs (all labs ordered are listed, but only abnormal results are displayed) Labs Reviewed  BASIC METABOLIC PANEL - Abnormal; Notable for the following components:      Result Value   Glucose, Bld 106 (*)    Calcium 8.8 (*)    All other components within normal limits  CBC - Abnormal; Notable for the following components:   RBC 3.66 (*)    Hemoglobin 12.7 (*)    HCT 38.5 (*)  MCV 105.2 (*)    MCH 34.7 (*)    All other components within normal limits  BRAIN NATRIURETIC PEPTIDE - Abnormal; Notable for the following components:   B Natriuretic Peptide 791.2 (*)    All other components within normal limits  TROPONIN I (HIGH SENSITIVITY)  TROPONIN I (HIGH SENSITIVITY)    EKG EKG Interpretation Date/Time:  Sunday February 04 2024 11:02:34 EST Ventricular Rate:  77 PR Interval:    QRS Duration:  161 QT Interval:  502 QTC Calculation: 569 R Axis:   -72  Text Interpretation: Atrial fibrillation Right bundle branch block Anteroseptal infarct, age indeterminate Baseline wander in lead(s) III V1 similar to prior EKGs Confirmed by Rolan Bucco [phone]) on 02/04/2024 11:50:33 AM  Radiology DG Chest 2 View Result Date: 02/04/2024 CLINICAL DATA:  Chest pain, Cough, and  shortness of breath. EXAM: CHEST - 2 VIEW COMPARISON:  10/23/2022 FINDINGS: The heart size and mediastinal contours are within normal limits. Both lungs are clear. The visualized skeletal structures are unremarkable. IMPRESSION: No active cardiopulmonary disease. Electronically Signed   By: Danae Orleans M.D.   On: 02/04/2024 12:57    Procedures Procedures    Medications Ordered in ED Medications  sodium chloride 0.9 % bolus 250 mL (250 mLs Intravenous New Bag/Given 02/04/24 1436)    ED Course/ Medical Decision Making/ A&P                                 Medical Decision Making Amount and/or Complexity of Data Reviewed Labs: ordered. Radiology: ordered.   Patient is a 83 year old male who presents with ongoing coughing with an upper respiratory infection.  Chest x-ray was interpreted by me and confirmed by the radiologist to show no acute abnormality.  He did have some associated chest discomfort.  It seems to be more related to the coughing but he describes it as a burning sensation.  His first troponin is negative.  However he does appear to be in atrial fibrillation.  I do not see that he has a history of this.  I reviewed his cardiology notes.  His old EKG show 1 reading that read as atrial fibrillation but was not clear to be atrial fibrillation.  This was in 2023.  Currently he is relatively asymptomatic.  Can likely be discharged, probably on anticoagulants.  Will consult cardiology.  Dr. Lockie Mola to take over care pending cards consult and repeat troponin.  Final Clinical Impression(s) / ED Diagnoses Final diagnoses:  Viral upper respiratory tract infection  Nonspecific chest pain    Rx / DC Orders ED Discharge Orders     None         Rolan Bucco, MD 02/04/24 [phone]

## 2024-02-04 NOTE — Discharge Instructions (Addendum)
 Stop aspirin and clopidogrel/Plavix and start Eliquis as we discussed for atrial fibrillation.  Follow-up with cardiologist.  Return if symptoms worsen.

## 2024-02-05 ENCOUNTER — Telehealth: Payer: Self-pay | Admitting: Internal Medicine

## 2024-02-05 NOTE — Telephone Encounter (Signed)
 Patient has questions about his instruction for his ER visit. Please Thomas [name]

## 2024-02-05 NOTE — Telephone Encounter (Signed)
 Left voicemail to return call to office

## 2024-02-06 NOTE — Telephone Encounter (Signed)
 Spoke with pt. Pt just needed a appointment. Appt set.

## 2024-02-06 NOTE — Telephone Encounter (Signed)
 Pt returning call, requesting cb

## 2024-02-13 NOTE — Progress Notes (Unsigned)
 Electrophysiology Office Note:   Date:  02/14/2024  ID:  Thomas, [name] 07-10-1941, MRN [id number]  Primary Cardiologist: Kristeen Miss, MD Primary Heart Failure: None Electrophysiologist: Lewayne Bunting, MD      History of Present Illness:   Thomas [name] is a 83 y.o. male with h/o PVC's, AF (new dx 01/2024), HLD, CAD seen today for routine electrophysiology followup.   Seen in ER on 02/04/24 for cough, intermittent shortness of breath and chest pain. Troponin negative x2, BNP 791. He was noted to be in AF which was a new diagnosis for him. Dr. Odis Hollingshead reviewed plan of care with EDP and recommended he stop plavix and begin full dose anticoagulation.  After the ER visit, several days later he had BBQ and then a subsequent GI illness.    Since last being seen in our clinic the patient reports he has recovered from his flu / GI illness.  He feels much better.  He was aware that his heart was out of rhythm when he was in the the ER but feels he is in normal rhythm now.  He feels good overall.  Asks why they changed him off plavix to Eliquis.  He denies chest pain, palpitations, dyspnea, PND, orthopnea, nausea, vomiting, dizziness, syncope, edema, weight gain, or early satiety.   Review of systems complete and found to be negative unless listed in HPI.   EP Information / Studies Reviewed:    EKG is not ordered today. EKG from 02/05/24 reviewed which showed AFib 83 bpm      Studies:  ECHO 10/2022 > LVEF 40-45%, GII DD R/LHC 10/2022 > PAP-mean 33/14-18 mmHg. PCWP 16/14-12 mmHg & LVEDP 12 mmHg (Well Compensated), Widely patent ostial LAD overlapping stents with no obstruction of flow in the LCx.  Carotid Duplex 11/2022 > mild bilateral internal carotid plaque   Arrhythmia / AAD PVC's > symptomatic, on amiodarone since 2022   AF > dx 01/2024   Risk Assessment/Calculations:    CHA2DS2-VASc Score = 4   This indicates a 4.8% annual risk of stroke. The patient's score is based upon: CHF  History: 1 HTN History: 0 Diabetes History: 0 Stroke History: 0 Vascular Disease History: 1 Age Score: 2 Gender Score: 0             Physical Exam:   VS:  BP 114/82   Pulse 71   Ht 6' (1.829 m)   Wt 190 lb 3.2 oz (86.3 kg)   SpO2 96%   BMI 25.80 kg/m    Wt Readings from Last 3 Encounters:  02/14/24 190 lb 3.2 oz (86.3 kg)  02/04/24 189 lb (85.7 kg)  05/30/23 198 lb (89.8 kg)     GEN: Well nourished, well developed in no acute distress NECK: No JVD; No carotid bruits CARDIAC: Regular rate and rhythm (sinus by auscultation), no murmurs, rubs, gallops RESPIRATORY:  Clear to auscultation without rales, wheezing or rhonchi  ABDOMEN: Soft, non-tender, non-distended EXTREMITIES:  No edema; No deformity   ASSESSMENT AND PLAN:    Paroxysmal Atrial Fibrillation  New onset 01/2024 in the setting of influenza -continue OAC for stroke prophylaxis  -continue amiodarone 200 mg M-F -assess amio labs > CMP, TSH / T4 -sinus on auscultation   Secondary Hypercoagulable State  -continue Eliquis 5mg  BID, dose reviewed and appropriate by wt / cr  HFrEF  -GDMT per primary Cardiology   Follow up with Dr. Ladona Ridgel in 3 months  Signed, Canary Brim, NP-C, AGACNP-BC [facility] - Electrophysiology  02/14/2024, 10:02 AM

## 2024-02-14 ENCOUNTER — Ambulatory Visit: Payer: Medicare Other | Attending: Pulmonary Disease | Admitting: Pulmonary Disease

## 2024-02-14 ENCOUNTER — Encounter: Payer: Self-pay | Admitting: Pulmonary Disease

## 2024-02-14 VITALS — BP 114/82 | HR 71 | Ht 72.0 in | Wt 190.2 lb

## 2024-02-14 DIAGNOSIS — Z79899 Other long term (current) drug therapy: Secondary | ICD-10-CM | POA: Diagnosis not present

## 2024-02-14 DIAGNOSIS — I2510 Atherosclerotic heart disease of native coronary artery without angina pectoris: Secondary | ICD-10-CM | POA: Diagnosis not present

## 2024-02-14 DIAGNOSIS — I5042 Chronic combined systolic (congestive) and diastolic (congestive) heart failure: Secondary | ICD-10-CM | POA: Diagnosis not present

## 2024-02-14 LAB — COMPREHENSIVE METABOLIC PANEL WITH GFR
ALT: 13 IU/L (ref 0–44)
AST: 20 IU/L (ref 0–40)
Albumin: 4.1 g/dL (ref 3.7–4.7)
Alkaline Phosphatase: 75 IU/L (ref 44–121)
BUN/Creatinine Ratio: 8 — ABNORMAL LOW (ref 10–24)
BUN: 14 mg/dL (ref 8–27)
Bilirubin Total: 0.8 mg/dL (ref 0.0–1.2)
CO2: 25 mmol/L (ref 20–29)
Calcium: 8.7 mg/dL (ref 8.6–10.2)
Chloride: 102 mmol/L (ref 96–106)
Creatinine, Ser: 1.79 mg/dL — ABNORMAL HIGH (ref 0.76–1.27)
Globulin, Total: 2.2 g/dL (ref 1.5–4.5)
Glucose: 95 mg/dL (ref 70–99)
Potassium: 3.7 mmol/L (ref 3.5–5.2)
Sodium: 141 mmol/L (ref 134–144)
Total Protein: 6.3 g/dL (ref 6.0–8.5)
eGFR: 38 mL/min/1.73 — ABNORMAL LOW

## 2024-02-14 LAB — TSH: TSH: 10.2 u[IU]/mL — ABNORMAL HIGH (ref 0.450–4.500)

## 2024-02-14 LAB — T4, FREE: Free T4: 1.66 ng/dL (ref 0.82–1.77)

## 2024-02-14 MED ORDER — APIXABAN 5 MG PO TABS: 5.0000 mg | ORAL_TABLET | Freq: Two times a day (BID) | ORAL | 3 refills | Status: DC

## 2024-02-14 NOTE — Patient Instructions (Signed)
 Medication Instructions:  Your physician recommends that you continue on your current medications as directed. Please refer to the Current Medication list given to you today.  *If you need a refill on your cardiac medications before your next appointment, please call your pharmacy*  Lab Work: CMET, TSH, FreeT4-TODAY If you have labs (blood work) drawn today and your tests are completely normal, you will receive your results only by: MyChart Message (if you have MyChart) OR A paper copy in the mail If you have any lab test that is abnormal or we need to change your treatment, we will call you to review the results.  Follow-Up: At [facility], you and your health needs are our priority.  As part of our continuing mission to provide you with exceptional heart care, we have created designated Provider Care Teams.  These Care Teams include your primary Cardiologist (physician) and Advanced Practice Providers (APPs -  Physician Assistants and Nurse Practitioners) who all work together to provide you with the care you need, when you need it.  Your next appointment:   3 month(s)  Provider:   Canary Brim, NP

## 2024-02-15 ENCOUNTER — Other Ambulatory Visit: Payer: Self-pay | Admitting: Internal Medicine

## 2024-02-20 ENCOUNTER — Ambulatory Visit: Payer: Medicare Other | Admitting: Internal Medicine

## 2024-02-21 ENCOUNTER — Encounter: Payer: Self-pay | Admitting: Internal Medicine

## 2024-04-22 ENCOUNTER — Other Ambulatory Visit: Payer: Self-pay | Admitting: Cardiovascular Disease

## 2024-05-04 ENCOUNTER — Encounter (HOSPITAL_COMMUNITY): Payer: Self-pay

## 2024-05-04 ENCOUNTER — Other Ambulatory Visit: Payer: Self-pay

## 2024-05-04 ENCOUNTER — Observation Stay (HOSPITAL_COMMUNITY)
Admission: EM | Admit: 2024-05-04 | Discharge: 2024-05-05 | Disposition: A | Discharge status: Home / Self Care | Attending: Internal Medicine | Admitting: Internal Medicine

## 2024-05-04 ENCOUNTER — Emergency Department (HOSPITAL_COMMUNITY)

## 2024-05-04 DIAGNOSIS — R0602 Shortness of breath: Secondary | ICD-10-CM | POA: Diagnosis present

## 2024-05-04 DIAGNOSIS — E782 Mixed hyperlipidemia: Secondary | ICD-10-CM

## 2024-05-04 DIAGNOSIS — J9601 Acute respiratory failure with hypoxia: Secondary | ICD-10-CM | POA: Diagnosis not present

## 2024-05-04 DIAGNOSIS — N400 Enlarged prostate without lower urinary tract symptoms: Secondary | ICD-10-CM | POA: Diagnosis not present

## 2024-05-04 DIAGNOSIS — K5900 Constipation, unspecified: Secondary | ICD-10-CM

## 2024-05-04 DIAGNOSIS — I5043 Acute on chronic combined systolic (congestive) and diastolic (congestive) heart failure: Secondary | ICD-10-CM | POA: Diagnosis not present

## 2024-05-04 DIAGNOSIS — Z87891 Personal history of nicotine dependence: Secondary | ICD-10-CM | POA: Diagnosis not present

## 2024-05-04 DIAGNOSIS — R300 Dysuria: Secondary | ICD-10-CM

## 2024-05-04 DIAGNOSIS — Z96643 Presence of artificial hip joint, bilateral: Secondary | ICD-10-CM | POA: Diagnosis not present

## 2024-05-04 DIAGNOSIS — Z7901 Long term (current) use of anticoagulants: Secondary | ICD-10-CM | POA: Diagnosis not present

## 2024-05-04 DIAGNOSIS — I2510 Atherosclerotic heart disease of native coronary artery without angina pectoris: Secondary | ICD-10-CM | POA: Diagnosis present

## 2024-05-04 DIAGNOSIS — I509 Heart failure, unspecified: Principal | ICD-10-CM

## 2024-05-04 DIAGNOSIS — I5042 Chronic combined systolic (congestive) and diastolic (congestive) heart failure: Secondary | ICD-10-CM

## 2024-05-04 DIAGNOSIS — Z79899 Other long term (current) drug therapy: Secondary | ICD-10-CM | POA: Diagnosis not present

## 2024-05-04 DIAGNOSIS — I480 Paroxysmal atrial fibrillation: Secondary | ICD-10-CM | POA: Diagnosis not present

## 2024-05-04 DIAGNOSIS — E039 Hypothyroidism, unspecified: Secondary | ICD-10-CM | POA: Diagnosis present

## 2024-05-04 DIAGNOSIS — E785 Hyperlipidemia, unspecified: Secondary | ICD-10-CM | POA: Diagnosis present

## 2024-05-04 DIAGNOSIS — I130 Hypertensive heart and chronic kidney disease with heart failure and stage 1 through stage 4 chronic kidney disease, or unspecified chronic kidney disease: Secondary | ICD-10-CM | POA: Diagnosis not present

## 2024-05-04 DIAGNOSIS — N1832 Chronic kidney disease, stage 3b: Secondary | ICD-10-CM | POA: Diagnosis present

## 2024-05-04 DIAGNOSIS — I10 Essential (primary) hypertension: Secondary | ICD-10-CM | POA: Diagnosis present

## 2024-05-04 DIAGNOSIS — I5041 Acute combined systolic (congestive) and diastolic (congestive) heart failure: Secondary | ICD-10-CM

## 2024-05-04 DIAGNOSIS — R0989 Other specified symptoms and signs involving the circulatory and respiratory systems: Secondary | ICD-10-CM

## 2024-05-04 LAB — CBC WITH DIFFERENTIAL/PLATELET
Abs Immature Granulocytes: 0.04 10*3/uL (ref 0.00–0.07)
Basophils Absolute: 0.1 10*3/uL (ref 0.0–0.1)
Basophils Relative: 1 %
Eosinophils Absolute: 0.1 10*3/uL (ref 0.0–0.5)
Eosinophils Relative: 1 %
HCT: 38.5 % — ABNORMAL LOW (ref 39.0–52.0)
Hemoglobin: 12.1 g/dL — ABNORMAL LOW (ref 13.0–17.0)
Immature Granulocytes: 0 %
Lymphocytes Relative: 12 %
Lymphs Abs: 1 10*3/uL (ref 0.7–4.0)
MCH: 35.8 pg — ABNORMAL HIGH (ref 26.0–34.0)
MCHC: 31.4 g/dL (ref 30.0–36.0)
MCV: 113.9 fL — ABNORMAL HIGH (ref 80.0–100.0)
Monocytes Absolute: 0.7 10*3/uL (ref 0.1–1.0)
Monocytes Relative: 8 %
Neutro Abs: 7.1 10*3/uL (ref 1.7–7.7)
Neutrophils Relative %: 78 %
Platelets: 238 10*3/uL (ref 150–400)
RBC: 3.38 MIL/uL — ABNORMAL LOW (ref 4.22–5.81)
RDW: 16.3 % — ABNORMAL HIGH (ref 11.5–15.5)
WBC: 9 10*3/uL (ref 4.0–10.5)
nRBC: 0 % (ref 0.0–0.2)

## 2024-05-04 LAB — COMPREHENSIVE METABOLIC PANEL WITH GFR
ALT: 50 U/L — ABNORMAL HIGH (ref 0–44)
AST: 56 U/L — ABNORMAL HIGH (ref 15–41)
Albumin: 3.7 g/dL (ref 3.5–5.0)
Alkaline Phosphatase: 56 U/L (ref 38–126)
Anion gap: 7 (ref 5–15)
BUN: 18 mg/dL (ref 8–23)
CO2: 24 mmol/L (ref 22–32)
Calcium: 8.7 mg/dL — ABNORMAL LOW (ref 8.9–10.3)
Chloride: 108 mmol/L (ref 98–111)
Creatinine, Ser: 1.41 mg/dL — ABNORMAL HIGH (ref 0.61–1.24)
GFR, Estimated: 50 mL/min — ABNORMAL LOW (ref >60)
Glucose, Bld: 109 mg/dL — ABNORMAL HIGH (ref 70–99)
Potassium: 3.5 mmol/L (ref 3.5–5.1)
Sodium: 139 mmol/L (ref 135–145)
Total Bilirubin: 1 mg/dL (ref 0.0–1.2)
Total Protein: 6.8 g/dL (ref 6.5–8.1)

## 2024-05-04 LAB — RESP PANEL BY RT-PCR (RSV, FLU A&B, COVID)  RVPGX2
Influenza A by PCR: NEGATIVE
Influenza B by PCR: NEGATIVE
Resp Syncytial Virus by PCR: NEGATIVE
SARS Coronavirus 2 by RT PCR: NEGATIVE

## 2024-05-04 LAB — BLOOD GAS, VENOUS
Acid-Base Excess: 1.7 mmol/L (ref 0.0–2.0)
Bicarbonate: 27.2 mmol/L (ref 20.0–28.0)
O2 Saturation: 33.5 %
Patient temperature: 37
pCO2, Ven: 45 mmHg (ref 44–60)
pH, Ven: 7.39 (ref 7.25–7.43)
pO2, Ven: <31 mmHg — CL (ref 32–45)

## 2024-05-04 LAB — BRAIN NATRIURETIC PEPTIDE: B Natriuretic Peptide: 1368.4 pg/mL — ABNORMAL HIGH (ref 0.0–100.0)

## 2024-05-04 LAB — TSH: TSH: 5.123 u[IU]/mL — ABNORMAL HIGH (ref 0.350–4.500)

## 2024-05-04 LAB — MAGNESIUM: Magnesium: 1.5 mg/dL — ABNORMAL LOW (ref 1.7–2.4)

## 2024-05-04 LAB — TROPONIN I (HIGH SENSITIVITY)
Troponin I (High Sensitivity): 23 ng/L — ABNORMAL HIGH (ref <18)
Troponin I (High Sensitivity): 40 ng/L — ABNORMAL HIGH (ref <18)

## 2024-05-04 MED ORDER — TRAZODONE HCL 50 MG PO TABS: 25.0000 mg | ORAL_TABLET | Freq: Every evening | ORAL | Status: DC | PRN

## 2024-05-04 MED ORDER — APIXABAN 5 MG PO TABS
5.0000 mg | ORAL_TABLET | Freq: Two times a day (BID) | ORAL | Status: DC
Start: 2024-05-04 — End: 2024-05-05
  Administered 2024-05-04 – 2024-05-05 (×3): 5 mg via ORAL
  Filled 2024-05-04: qty 2
  Filled 2024-05-04 (×2): qty 1

## 2024-05-04 MED ORDER — LEVOTHYROXINE SODIUM 75 MCG PO TABS
150.0000 ug | ORAL_TABLET | Freq: Every day | ORAL | Status: DC
  Administered 2024-05-05: 150 ug via ORAL
  Filled 2024-05-04: qty 2

## 2024-05-04 MED ORDER — POTASSIUM CHLORIDE CRYS ER 20 MEQ PO TBCR: 20.0000 meq | EXTENDED_RELEASE_TABLET | Freq: Every day | ORAL | Status: DC

## 2024-05-04 MED ORDER — ONDANSETRON HCL 4 MG/2ML IJ SOLN: 4.0000 mg | Freq: Four times a day (QID) | INTRAMUSCULAR | Status: DC | PRN

## 2024-05-04 MED ORDER — FUROSEMIDE 10 MG/ML IJ SOLN
40.0000 mg | Freq: Every day | INTRAMUSCULAR | Status: DC
Start: 2024-05-05 — End: 2024-05-05
  Administered 2024-05-05: 40 mg via INTRAVENOUS
  Filled 2024-05-04: qty 4

## 2024-05-04 MED ORDER — ALBUTEROL SULFATE (2.5 MG/3ML) 0.083% IN NEBU: 2.5000 mg | INHALATION_SOLUTION | RESPIRATORY_TRACT | Status: DC | PRN

## 2024-05-04 MED ORDER — ACETAMINOPHEN 325 MG PO TABS: 650.0000 mg | ORAL_TABLET | Freq: Four times a day (QID) | ORAL | Status: DC | PRN

## 2024-05-04 MED ORDER — ACETAMINOPHEN 650 MG RE SUPP: 650.0000 mg | Freq: Four times a day (QID) | RECTAL | Status: DC | PRN

## 2024-05-04 MED ORDER — ROSUVASTATIN CALCIUM 10 MG PO TABS
10.0000 mg | ORAL_TABLET | Freq: Every day | ORAL | Status: DC
  Administered 2024-05-04 – 2024-05-05 (×2): 10 mg via ORAL
  Filled 2024-05-04 (×2): qty 1

## 2024-05-04 MED ORDER — AMIODARONE HCL 200 MG PO TABS
200.0000 mg | ORAL_TABLET | ORAL | Status: DC
Start: 2024-05-06 — End: 2024-05-05

## 2024-05-04 MED ORDER — POTASSIUM CHLORIDE CRYS ER 20 MEQ PO TBCR
40.0000 meq | EXTENDED_RELEASE_TABLET | Freq: Once | ORAL | Status: AC
  Administered 2024-05-04: 40 meq via ORAL
  Filled 2024-05-04: qty 2

## 2024-05-04 MED ORDER — FUROSEMIDE 10 MG/ML IJ SOLN
40.0000 mg | Freq: Once | INTRAMUSCULAR | Status: AC
  Administered 2024-05-04: 40 mg via INTRAVENOUS
  Filled 2024-05-04: qty 4

## 2024-05-04 MED ORDER — SACUBITRIL-VALSARTAN 24-26 MG PO TABS
1.0000 | ORAL_TABLET | Freq: Two times a day (BID) | ORAL | Status: DC
  Administered 2024-05-04 – 2024-05-05 (×3): 1 via ORAL
  Filled 2024-05-04 (×4): qty 1

## 2024-05-04 MED ORDER — ONDANSETRON HCL 4 MG PO TABS: 4.0000 mg | ORAL_TABLET | Freq: Four times a day (QID) | ORAL | Status: DC | PRN

## 2024-05-04 MED ORDER — MAGNESIUM SULFATE 2 GM/50ML IV SOLN
2.0000 g | Freq: Once | INTRAVENOUS | Status: AC
  Administered 2024-05-04: 2 g via INTRAVENOUS
  Filled 2024-05-04: qty 50

## 2024-05-04 MED ORDER — POTASSIUM CHLORIDE CRYS ER 20 MEQ PO TBCR
20.0000 meq | EXTENDED_RELEASE_TABLET | Freq: Every day | ORAL | Status: DC
  Administered 2024-05-05: 20 meq via ORAL
  Filled 2024-05-04: qty 1

## 2024-05-04 NOTE — H&P (Signed)
 History and Physical  Thomas [name]:[id number] DOB: 1941/07/20 DOA: 05/04/2024  PCP: [name], MD   Chief Complaint: SOB   HPI: Thomas [name] is a 83 y.o. male with medical history significant for CAD, paroxysmal atrial fibrillation on Eliquis , hypertension, hyperlipidemia, combined heart failure being admitted to the hospital with acute on chronic systolic and diastolic congestive heart failure.  Patient states he was in his usual state of health, compliant with all of his medications including amiodarone  and Lasix  20 mg p.o. daily when he woke up at about 4 AM today short of breath.  He denies any orthopnea, chest pain, he does endorse some dyspnea with exertion over the last few days.  No fevers, chills, nausea or vomiting.  Review of Systems: Please see HPI for pertinent positives and negatives. A complete 10 system review of systems are otherwise negative.  Past Medical History:  Diagnosis Date   Arthritis    Bifascicular bundle branch block    Bigeminy 06/2019   Coronary artery disease    takes Plavix  and ASA daily   GERD (gastroesophageal reflux disease)    occasional and will take Tums if needed   History of blood clots 2008   in chest   History of kidney stones late 90's   Hyperlipidemia    takes Simvastatin  daily   Hypertension    takes Maxzide  daily   Hypothyroidism    takes Synthroid  daily   Joint pain    Joint swelling    left knee   Pneumonia    at 83 years old, double   Past Surgical History:  Procedure Laterality Date   ARTERY BIOPSY Left 12/15/2021   Procedure: LEFT TEMPORAL ARTERY BIOPSY;  Surgeon: Carlene Che, MD;  Location: [facility] OR;  Service: Vascular;  Laterality: Left;   CARDIAC CATHETERIZATION     CHOLECYSTECTOMY N/A 06/06/2018   Procedure: LAPAROSCOPIC CHOLECYSTECTOMY WITH INTRAOPERATIVE CHOLANGIOGRAM;  Surgeon: Dareen Ebbing, MD;  Location: [facility] OR;  Service: General;  Laterality: N/A;   CORONARY ANGIOPLASTY  05/21/2007   lad  stent/taxus stent   CYSTOSCOPY WITH RETROGRADE PYELOGRAM, URETEROSCOPY AND STENT PLACEMENT Right 03/29/2022   Procedure: CYSTOSCOPY WITH RETROGRADE PYELOGRAM AND STENT PLACEMENT;  Surgeon: Samson Croak, MD;  Location: [facility] ORS;  Service: Urology;  Laterality: Right;   CYSTOSCOPY/URETEROSCOPY/HOLMIUM LASER/STENT PLACEMENT Right 04/18/2022   Procedure: CYSTOSCOPY\ RIGHT URETEROSCOPY/STONE BASKET EXTRACTION/STENT PLACEMENT;  Surgeon: Samson Croak, MD;  Location: [facility] ORS;  Service: Urology;  Laterality: Right;  60 MINS FOR CASE   HEMORROIDECTOMY  late 1970's   HERNIA REPAIR Right 1980's   RIGHT/LEFT HEART CATH AND CORONARY ANGIOGRAPHY N/A 10/26/2022   Procedure: RIGHT/LEFT HEART CATH AND CORONARY ANGIOGRAPHY;  Surgeon: Arleen Lacer, MD;  Location: [facility] INVASIVE CV LAB;  Service: Cardiovascular;  Laterality: N/A;   TOTAL HIP ARTHROPLASTY Right 10/28/2013   Procedure: RIGHT TOTAL HIP ARTHROPLASTY ANTERIOR APPROACH;  Surgeon: Aurther Blue, MD;  Location: [facility] ORS;  Service: Orthopedics;  Laterality: Right;   TOTAL HIP ARTHROPLASTY Left 05/08/2015   Procedure: LEFT TOTAL HIP ARTHROPLASTY ANTERIOR APPROACH;  Surgeon: Liliane Rei, MD;  Location: [facility] OR;  Service: Orthopedics;  Laterality: Left;   Social History:  reports that he quit smoking about 9 years ago. His smoking use included cigarettes. He started smoking about 61 years ago. He has a 13 pack-year smoking history. He has never used smokeless tobacco. He reports that he does not currently use alcohol. He reports that he does not use  drugs.  Allergies  Allergen Reactions   Hydrocodone  Nausea And Vomiting and Other (See Comments)    Dizziness and upset stomach   Robaxin  [Methocarbamol ] Nausea And Vomiting   Roxicodone  [Oxycodone ] Nausea And Vomiting   Ultram  [Tramadol ] Nausea And Vomiting    Family History  Problem Relation Age of Onset   CAD Mother    Heart failure Mother    Pneumonia Father    Heart failure Father      Prior  to Admission medications   Medication Sig Start Date End Date Taking? Authorizing Provider  acetaminophen  (TYLENOL ) 500 MG tablet Take 500 mg by mouth daily as needed for moderate pain.    [provider]  amiodarone  (PACERONE ) 200 MG tablet TAKE 1 TABLET BY MOUTH ONCE DAILY MONDAY THROUGH FRIDAY 02/15/24   Tammie Fall, MD  apixaban  (ELIQUIS ) 5 MG TABS tablet Take 1 tablet (5 mg total) by mouth 2 (two) times daily. 02/14/24   Thomasena Fleming, NP  azelastine (ASTELIN) 0.1 % nasal spray Place 1 spray into both nostrils daily as needed for rhinitis. Use in each nostril as directed    [provider]  furosemide  (LASIX ) 20 MG tablet Take 1 tablet by mouth three times per week on Mondays, Wednesdays, and Fridays 11/25/22   Nahser, Lela Purple, MD  levothyroxine  (SYNTHROID , LEVOTHROID) 150 MCG tablet Take 150 mcg by mouth daily. 01/04/18   [provider]  potassium chloride  SA (KLOR-CON  M) 20 MEQ tablet Take by mouth. 01/16/24   [provider]  rosuvastatin  (CRESTOR ) 10 MG tablet Take 10 mg by mouth daily.    [provider]  sacubitril-valsartan (ENTRESTO ) 24-26 MG TAKE 1 TABLET BY MOUTH TWICE DAILY 04/22/24   Nahser, Lela Purple, MD    Physical Exam: BP (!) 153/89   Pulse 73   Temp 97.6 F (36.4 C) (Oral)   Resp 18   SpO2 100%  General:  Alert, oriented, calm, in no acute distress, he does not look overtly fluid overloaded on exam Cardiovascular: Irregularly irregular, no murmurs or rubs, no peripheral edema  Respiratory: clear to auscultation bilaterally but diminished at the bilateral bases, no wheezes, no crackles  Abdomen: soft, nontender, nondistended, normal bowel tones heard  Skin: dry, no rashes  Musculoskeletal: no joint effusions, normal range of motion  Psychiatric: appropriate affect, normal speech  Neurologic: extraocular muscles intact, clear speech, moving all extremities with intact sensorium         Labs on Admission:  Basic Metabolic  Panel: Recent Labs  Lab 05/04/24 0659  NA 139  K 3.5  CL 108  CO2 24  GLUCOSE 109*  BUN 18  CREATININE 1.41*  CALCIUM  8.7*  MG 1.5*   Liver Function Tests: Recent Labs  Lab 05/04/24 0659  AST 56*  ALT 50*  ALKPHOS 56  BILITOT 1.0  PROT 6.8  ALBUMIN 3.7   No results for input(s): "LIPASE", "AMYLASE" in the last 168 hours. No results for input(s): "AMMONIA" in the last 168 hours. CBC: Recent Labs  Lab 05/04/24 0659  WBC 9.0  NEUTROABS 7.1  HGB 12.1*  HCT 38.5*  MCV 113.9*  PLT 238   Cardiac Enzymes: No results for input(s): "CKTOTAL", "CKMB", "CKMBINDEX", "TROPONINI" in the last 168 hours. BNP (last 3 results) Recent Labs    02/04/24 1132 05/04/24 0659  BNP 791.2* 1,368.4*    ProBNP (last 3 results) No results for input(s): "PROBNP" in the last 8760 hours.  CBG: No results for input(s): "GLUCAP" in  the last 168 hours.  Radiological Exams on Admission: DG Chest Port 1 View Result Date: 05/04/2024 CLINICAL DATA:  Dyspnea.  Shortness of breath and chest tightness. EXAM: PORTABLE CHEST 1 VIEW COMPARISON:  02/04/2024 FINDINGS: Mild cardiac enlargement. Increased interstitial markings with a lower lung zone predominance is new from previous exam. No significant pleural effusion, pneumothorax or airspace consolidation. The visualized osseous structures are within normal limits. IMPRESSION: Increased interstitial markings with a lower lung zone predominance. Differential considerations include interstitial edema versus atypical infection. Electronically Signed   By: Kimberley Penman M.D.   On: 05/04/2024 07:51   Assessment/Plan Thomas [name] is a 83 y.o. male with medical history significant for CAD, paroxysmal atrial fibrillation on Eliquis , hypertension, hyperlipidemia, combined heart failure being admitted to the hospital with acute on chronic systolic and diastolic congestive heart failure.   Acute on chronic systolic and diastolic congestive heart failure-with  elevated BNP, interstitial edema on chest x-ray, dyspnea.  Oxygen was placed for comfort, patient is saturating mid 90s on room air. -Inpatient admission -Cardiac telemetry -Heart healthy diet -IV Lasix  40 mg daily, with potassium supplementation -Entresto , statin continued  Paroxysmal atrial fibrillation-currently appears to be in atrial fibrillation, with rates controlled -Amiodarone  200 mg p.o. daily on weekdays -Eliquis  5 mg p.o. twice daily  Hypothyroidism-Synthroid   DVT prophylaxis: Eliquis     Code Status: Full Code  Consults called: None  Admission status: The appropriate patient status for this patient is INPATIENT. Inpatient status is judged to be reasonable and necessary in order to provide the required intensity of service to ensure the patient's safety. The patient's presenting symptoms, physical exam findings, and initial radiographic and laboratory data in the context of their chronic comorbidities is felt to place them at high risk for further clinical deterioration. Furthermore, it is not anticipated that the patient will be medically stable for discharge from the hospital within 2 midnights of admission.    I certify that at the point of admission it is my clinical judgment that the patient will require inpatient hospital care spanning beyond 2 midnights from the point of admission due to high intensity of service, high risk for further deterioration and high frequency of surveillance required  Time spent: 53 minutes  Hogan Hoobler Rickey Charm MD [facility] Pager [phone]  If 7PM-7AM, please contact night-coverage [email or web address] Password [email or web address]  05/04/2024, 9:00 AM

## 2024-05-04 NOTE — ED Provider Notes (Signed)
 [place] EMERGENCY DEPARTMENT AT [facility] Provider Note   CSN: [id number] Arrival date & time: 05/04/24  4098     History  Chief Complaint  Patient presents with   Shortness of Breath    Thomas [name] is a 83 y.o. male.   Shortness of Breath Patient presents for shortness of breath.  Medical history includes CHF, CKD, atrial fibrillation, CAD, hypothyroidism, BPH, arthritis, HLD, HTN.  On medications include amiodarone , Eliquis , and Lasix .  Per chart review, he is prescribed Lasix  every other day.  Patient states that he takes 1 tablet every day.  He felt to be in his normal state of health last night.  This morning at 4 AM, he woke with shortness of breath.  Patient describes a difficulty taking a full breath.  He feels like his symptoms are reminiscent of prior hospitalization for CHF exacerbation.  He denies any current areas of discomfort.     Home Medications Prior to Admission medications   Medication Sig Start Date End Date Taking? Authorizing Provider  acetaminophen  (TYLENOL ) 500 MG tablet Take 500 mg by mouth daily as needed for moderate pain.    [provider]  amiodarone  (PACERONE ) 200 MG tablet TAKE 1 TABLET BY MOUTH ONCE DAILY MONDAY THROUGH FRIDAY 02/15/24   Tammie Fall, MD  apixaban  (ELIQUIS ) 5 MG TABS tablet Take 1 tablet (5 mg total) by mouth 2 (two) times daily. 02/14/24   Thomasena Fleming, NP  azelastine (ASTELIN) 0.1 % nasal spray Place 1 spray into both nostrils daily as needed for rhinitis. Use in each nostril as directed    [provider]  furosemide  (LASIX ) 20 MG tablet Take 1 tablet by mouth three times per week on Mondays, Wednesdays, and Fridays 11/25/22   Nahser, Lela Purple, MD  levothyroxine  (SYNTHROID , LEVOTHROID) 150 MCG tablet Take 150 mcg by mouth daily. 01/04/18   [provider]  potassium chloride  SA (KLOR-CON  M) 20 MEQ tablet Take by mouth. 01/16/24   [provider]  rosuvastatin  (CRESTOR ) 10 MG  tablet Take 10 mg by mouth daily.    [provider]  sacubitril-valsartan (ENTRESTO ) 24-26 MG TAKE 1 TABLET BY MOUTH TWICE DAILY 04/22/24   Nahser, Lela Purple, MD      Allergies    Hydrocodone , Robaxin  [methocarbamol ], Roxicodone  Fimbria.Fothergill ], and Ultram  [tramadol ]    Review of Systems   Review of Systems  Respiratory:  Positive for shortness of breath.   All other systems reviewed and are negative.   Physical Exam Updated Vital Signs BP (!) 153/89   Pulse 82   Temp 97.6 F (36.4 C) (Oral)   Resp (!) 21   SpO2 97%  Physical Exam Vitals and nursing note reviewed.  Constitutional:      General: He is not in acute distress.    Appearance: He is well-developed. He is not ill-appearing, toxic-appearing or diaphoretic.  HENT:     Head: Normocephalic and atraumatic.     Mouth/Throat:     Mouth: Mucous membranes are moist.  Eyes:     Conjunctiva/sclera: Conjunctivae normal.  Cardiovascular:     Rate and Rhythm: Normal rate. Rhythm irregular.     Heart sounds: No murmur heard. Pulmonary:     Effort: Pulmonary effort is normal. No tachypnea, accessory muscle usage or respiratory distress.     Breath sounds: Decreased breath sounds and rales present.  Abdominal:     Palpations: Abdomen is soft.     Tenderness: There is no abdominal  tenderness.  Musculoskeletal:        General: No swelling. Normal range of motion.     Cervical back: Normal range of motion and neck supple.     Right lower leg: No edema.     Left lower leg: No edema.  Skin:    General: Skin is warm and dry.     Coloration: Skin is not cyanotic or pale.     Findings: Ecchymosis present.  Neurological:     General: No focal deficit present.     Mental Status: He is alert and oriented to person, place, and time.  Psychiatric:        Mood and Affect: Mood normal.        Behavior: Behavior normal.     ED Results / Procedures / Treatments   Labs (all labs ordered are listed, but only abnormal results  are displayed) Labs Reviewed  COMPREHENSIVE METABOLIC PANEL WITH GFR - Abnormal; Notable for the following components:      Result Value   Glucose, Bld 109 (*)    Creatinine, Ser 1.41 (*)    Calcium  8.7 (*)    AST 56 (*)    ALT 50 (*)    GFR, Estimated 50 (*)    All other components within normal limits  BRAIN NATRIURETIC PEPTIDE - Abnormal; Notable for the following components:   B Natriuretic Peptide 1,368.4 (*)    All other components within normal limits  BLOOD GAS, VENOUS - Abnormal; Notable for the following components:   pO2, Ven <31 (*)    All other components within normal limits  CBC WITH DIFFERENTIAL/PLATELET - Abnormal; Notable for the following components:   RBC 3.38 (*)    Hemoglobin 12.1 (*)    HCT 38.5 (*)    MCV 113.9 (*)    MCH 35.8 (*)    RDW 16.3 (*)    All other components within normal limits  MAGNESIUM  - Abnormal; Notable for the following components:   Magnesium  1.5 (*)    All other components within normal limits  TSH - Abnormal; Notable for the following components:   TSH 5.123 (*)    All other components within normal limits  TROPONIN I (HIGH SENSITIVITY) - Abnormal; Notable for the following components:   Troponin I (High Sensitivity) 23 (*)    All other components within normal limits  RESP PANEL BY RT-PCR (RSV, FLU A&B, COVID)  [id number]    EKG None  Radiology DG Chest Port 1 View Result Date: 05/04/2024 CLINICAL DATA:  Dyspnea.  Shortness of breath and chest tightness. EXAM: PORTABLE CHEST 1 VIEW COMPARISON:  02/04/2024 FINDINGS: Mild cardiac enlargement. Increased interstitial markings with a lower lung zone predominance is new from previous exam. No significant pleural effusion, pneumothorax or airspace consolidation. The visualized osseous structures are within normal limits. IMPRESSION: Increased interstitial markings with a lower lung zone predominance. Differential considerations include interstitial edema versus atypical infection.  Electronically Signed   By: Kimberley Penman M.D.   On: 05/04/2024 07:51    Procedures Procedures    Medications Ordered in ED Medications  magnesium  sulfate IVPB 2 g 50 mL (has no administration in time range)  potassium chloride  SA (KLOR-CON  M) CR tablet 40 mEq (has no administration in time range)  furosemide  (LASIX ) injection 40 mg (has no administration in time range)    ED Course/ Medical Decision Making/ A&P  Medical Decision Making Amount and/or Complexity of Data Reviewed Labs: ordered. Radiology: ordered.  Risk Prescription drug management. Decision regarding hospitalization.   This patient presents to the ED for concern of shortness of breath, this involves an extensive number of treatment options, and is a complaint that carries with it a high risk of complications and morbidity.  The differential diagnosis includes CHF exacerbation, pneumonia, pleural effusion, active airway disease, anemia, acidosis, other metabolic derangements   Co morbidities that complicate the patient evaluation  CHF, CKD, atrial fibrillation, CAD, hypothyroidism, BPH, arthritis, HLD, HTN   Additional history obtained:  Additional history obtained from N/A External records from outside source obtained and reviewed including EMR   Lab Tests:  I Ordered, and personally interpreted labs.  The pertinent results include: Elevation in BNP consistent with CHF exacerbation.  Hypomagnesemia is present.  Potassium is low-normal.  Electrolytes otherwise unremarkable.  Hemoglobin is baseline.  No leukocytosis is present.   Imaging Studies ordered:  I ordered imaging studies including x-ray I independently visualized and interpreted imaging which showed increased interstitial markings with predominance in lower lungs consistent with interstitial edema secondary to CHF exacerbation. I agree with the radiologist interpretation   Cardiac Monitoring: / EKG:  The  patient was maintained on a cardiac monitor.  I personally viewed and interpreted the cardiac monitored which showed an underlying rhythm of: Atrial fibrillation   Problem List / ED Course / Critical interventions / Medication management  Patient presenting for shortness of breath.  He reports that onset was early this morning.  On arrival in the ED, vital signs notable for moderate hypertension.  He arrives via EMS on 2 L of supplemental oxygen.  He is not on oxygen at baseline.  Current breathing is unlabored.  He is able to speak in complete sentences.  On lung auscultation, he does have bibasilar diminished breath sounds with crackles present.  Patient was placed on bedside cardiac monitor.  Workup was initiated.  X-ray and lab work consistent with CHF exacerbation.  Patient's magnesium  is low and potassium is low-normal.  Replacement electrolytes were ordered to optimize.  IV Lasix  was initiated.  Patient to be admitted for further management. I ordered medication including potassium chloride  and magnesium  sulfate for electrolyte optimization; Lasix  for diuresis Reevaluation of the patient after these medicines showed that the patient improved I have reviewed the patients home medicines and have made adjustments as needed  Social Determinants of Health:  Lives at home with wife  CRITICAL CARE Performed by: Iva Mariner   Total critical care time: 32 minutes  Critical care time was exclusive of separately billable procedures and treating other patients.  Critical care was necessary to treat or prevent imminent or life-threatening deterioration.  Critical care was time spent personally by me on the following activities: development of treatment plan with patient and/or surrogate as well as nursing, discussions with consultants, evaluation of patient's response to treatment, examination of patient, obtaining history from patient or surrogate, ordering and performing treatments and  interventions, ordering and review of laboratory studies, ordering and review of radiographic studies, pulse oximetry and re-evaluation of patient's condition.        Final Clinical Impression(s) / ED Diagnoses Final diagnoses:  Acute on chronic congestive heart failure, unspecified heart failure type (HCC)  Acute respiratory failure with hypoxia [facility])    Rx / DC Orders ED Discharge Orders     None         Iva Mariner, MD 05/04/24 [phone]

## 2024-05-04 NOTE — Plan of Care (Signed)

## 2024-05-04 NOTE — ED Triage Notes (Signed)
 Pt came in via EMS from home w/ c/o of SOB and chest tightness. Hx of CHF and pt states it feels similar to previous exacerbations.  96-98 on 2L BP 164/98 HR 90s

## 2024-05-05 ENCOUNTER — Encounter (HOSPITAL_COMMUNITY): Payer: Self-pay | Admitting: Internal Medicine

## 2024-05-05 DIAGNOSIS — I25118 Atherosclerotic heart disease of native coronary artery with other forms of angina pectoris: Secondary | ICD-10-CM

## 2024-05-05 DIAGNOSIS — I5043 Acute on chronic combined systolic (congestive) and diastolic (congestive) heart failure: Principal | ICD-10-CM

## 2024-05-05 DIAGNOSIS — I509 Heart failure, unspecified: Secondary | ICD-10-CM

## 2024-05-05 DIAGNOSIS — R300 Dysuria: Secondary | ICD-10-CM | POA: Diagnosis not present

## 2024-05-05 DIAGNOSIS — I10 Essential (primary) hypertension: Secondary | ICD-10-CM

## 2024-05-05 DIAGNOSIS — K5900 Constipation, unspecified: Secondary | ICD-10-CM

## 2024-05-05 DIAGNOSIS — N1832 Chronic kidney disease, stage 3b: Secondary | ICD-10-CM

## 2024-05-05 LAB — BASIC METABOLIC PANEL WITH GFR
Anion gap: 9 (ref 5–15)
BUN: 19 mg/dL (ref 8–23)
CO2: 26 mmol/L (ref 22–32)
Calcium: 9.1 mg/dL (ref 8.9–10.3)
Chloride: 103 mmol/L (ref 98–111)
Creatinine, Ser: 1.44 mg/dL — ABNORMAL HIGH (ref 0.61–1.24)
GFR, Estimated: 49 mL/min — ABNORMAL LOW (ref >60)
Glucose, Bld: 106 mg/dL — ABNORMAL HIGH (ref 70–99)
Potassium: 3.3 mmol/L — ABNORMAL LOW (ref 3.5–5.1)
Sodium: 138 mmol/L (ref 135–145)

## 2024-05-05 LAB — URINALYSIS, ROUTINE W REFLEX MICROSCOPIC
Bilirubin Urine: NEGATIVE
Glucose, UA: NEGATIVE mg/dL
Hgb urine dipstick: NEGATIVE
Ketones, ur: NEGATIVE mg/dL
Leukocytes,Ua: NEGATIVE
Nitrite: NEGATIVE
Protein, ur: NEGATIVE mg/dL
Specific Gravity, Urine: 1.012 (ref 1.005–1.030)
pH: 6 (ref 5.0–8.0)

## 2024-05-05 LAB — CBC
HCT: 42.7 % (ref 39.0–52.0)
Hemoglobin: 13.4 g/dL (ref 13.0–17.0)
MCH: 35.7 pg — ABNORMAL HIGH (ref 26.0–34.0)
MCHC: 31.4 g/dL (ref 30.0–36.0)
MCV: 113.9 fL — ABNORMAL HIGH (ref 80.0–100.0)
Platelets: 255 10*3/uL (ref 150–400)
RBC: 3.75 MIL/uL — ABNORMAL LOW (ref 4.22–5.81)
RDW: 16.4 % — ABNORMAL HIGH (ref 11.5–15.5)
WBC: 7.9 10*3/uL (ref 4.0–10.5)
nRBC: 0 % (ref 0.0–0.2)

## 2024-05-05 MED ORDER — FUROSEMIDE 20 MG PO TABS: ORAL_TABLET | ORAL | Status: DC

## 2024-05-05 MED ORDER — LACTULOSE 10 GM/15ML PO SOLN: 30.0000 g | Freq: Every day | ORAL | 0 refills | Status: DC | PRN

## 2024-05-05 MED ORDER — CEFADROXIL 500 MG PO CAPS: 500.0000 mg | ORAL_CAPSULE | Freq: Two times a day (BID) | ORAL | 0 refills | Status: AC

## 2024-05-05 MED ORDER — POTASSIUM CHLORIDE CRYS ER 20 MEQ PO TBCR: 20.0000 meq | EXTENDED_RELEASE_TABLET | ORAL | 0 refills | Status: DC

## 2024-05-05 MED ORDER — POTASSIUM CHLORIDE CRYS ER 20 MEQ PO TBCR
40.0000 meq | EXTENDED_RELEASE_TABLET | ORAL | Status: DC
Start: 2024-05-05 — End: 2024-05-05
  Administered 2024-05-05: 40 meq via ORAL
  Filled 2024-05-05: qty 2

## 2024-05-05 NOTE — Assessment & Plan Note (Signed)
 05-05-2024 will obtain UA prior to DC. Rx duricef but asked pt to not take Rx until I call him at home with his UA results.  Given holiday weekend, most pharmacies will not be open on Memorial day in case he does have a UTI.

## 2024-05-05 NOTE — Discharge Summary (Signed)
 [facility] Phys[facility] Discharge Summary   Patient name: Thomas [name]  Admit date:     05/04/2024  Discharge date: 05/05/2024  Attending Physician: Gaylin Ke [[id number]]  Discharge Physician: Unk Garb   PCP: [name], MD  Admitted From: Home Disposition:  Home  Recommendations for Outpatient Follow-up:  Follow up with PCP in 1-2 weeks Please follow up on the following pending results: UA  Home Health:No Equipment/Devices: None    Discharge Condition:Stable CODE STATUS:FULL Diet recommendation: Heart Healthy Fluid Restriction: None  Hospital Summary: HPI: Thomas [name] is a 83 y.o. male with medical history significant for CAD, paroxysmal atrial fibrillation on Eliquis , hypertension, hyperlipidemia, combined heart failure being admitted to the hospital with acute on chronic systolic and diastolic congestive heart failure.  Patient states he was in his usual state of health, compliant with all of his medications including amiodarone  and Lasix  20 mg p.o. daily when he woke up at about 4 AM today short of breath.  He denies any orthopnea, chest pain, he does endorse some dyspnea with exertion over the last few days.  No fevers, chills, nausea or vomiting.   Significant Events: Admitted 05/04/2024 on acute on chronic systolic/diastolic CHF   Admission Labs: VBG pH 7.39, PCO2 of 45 Mg 1.5 Na 139, K 3.5, CO2 of 24, BUN 18, Scr 1.41, glu 109 BNP 1368 WBC 9.0, HgB 12.1, plt 238 TSH 5.123 Covid/flu/rsv negative  Admission Imaging Studies: CXR Increased interstitial markings with a lower lung zone predominance. Differential considerations include interstitial edema versus atypical infection  Significant Labs:   Significant Imaging Studies: CXR Increased interstitial markings with a lower lung zone predominance. Differential considerations include interstitial edema versus atypical infection.  Antibiotic Therapy: Anti-infectives (From  admission, onward)    None       Procedures:   Consultants:    Hospital Course by Problem: * Acute on chronic combined systolic and diastolic CHF (congestive heart failure) (HCC) 05-05-2024 pt already weaned to RA. Pt has already walked 450 feet with mobility specialist on RA. Pt has supply of lasix  at home. Pt requesting DC to home. Dc to home with lasix  20 mg bid x 2 days, then go back to 20 mg on M, W, F.  Continue with his GDMT. 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  Constipation 05-05-2024 pt c/o of constipation. In order to prevent him from having fecal incontinence while he is riding home in his car, will delay laxative until he gets home. Prn lactulose. Pt to pick up OTC enema at [facility] on his way home.  Dysuria 05-05-2024 will obtain UA prior to DC. Rx duricef but asked pt to not take Rx until I call him at home with his UA results.  Given holiday weekend, most pharmacies will not be open on Memorial day in case he does have a UTI.  Benign hypertension 05-05-2024 controlled with his CHF med regimen.  Stage 3b chronic kidney disease (CKD) (HCC) 05-05-2024 DC Scr of 1.44  Hypothyroidism, acquired 05-05-2024 stable.  BPH (benign prostatic hyperplasia) 05-05-2024 stable.  Hyperlipidemia 05-05-2024 on crestor   CAD (coronary artery disease) 05-05-2024 stable. On crestor .    Discharge Diagnoses:  Principal Problem:   Acute on chronic combined systolic and diastolic CHF (congestive heart failure) (HCC) Active Problems:   Dysuria   Constipation   CAD (coronary artery disease)   Hyperlipidemia   BPH (benign prostatic hyperplasia)   Hypothyroidism, acquired   Stage 3b  chronic kidney disease (CKD) (HCC)   Benign hypertension   Discharge Instructions  Discharge Instructions     (HEART FAILURE PATIENTS) Call MD:  Anytime you have any of the following symptoms: 1) 3 pound weight gain in 24 hours or 5 pounds in 1  week 2) shortness of breath, with or without a dry hacking cough 3) swelling in the hands, feet or stomach 4) if you have to sleep on extra pillows at night in order to breathe.   Complete by: As directed    Call MD for:  difficulty breathing, headache or visual disturbances   Complete by: As directed    Call MD for:  extreme fatigue   Complete by: As directed    Call MD for:  hives   Complete by: As directed    Call MD for:  persistant dizziness or light-headedness   Complete by: As directed    Call MD for:  persistant nausea and vomiting   Complete by: As directed    Call MD for:  redness, tenderness, or signs of infection (pain, swelling, redness, odor or green/yellow discharge around incision site)   Complete by: As directed    Call MD for:  severe uncontrolled pain   Complete by: As directed    Call MD for:  temperature >100.4   Complete by: As directed    Diet - low sodium heart healthy   Complete by: As directed    Discharge instructions   Complete by: As directed    1. Follow up with your primary care provider in 1-2 weeks following discharge from hospital.   Increase activity slowly   Complete by: As directed       Allergies as of 05/05/2024       Reactions   Hydrocodone  Nausea And Vomiting, Other (See Comments)   Dizziness and upset stomach   Oxycodone  Nausea And Vomiting, Nausea Only   Robaxin  [methocarbamol ] Nausea And Vomiting   Ultram  [tramadol ] Nausea And Vomiting        Medication List     TAKE these medications    acetaminophen  500 MG tablet Commonly known as: TYLENOL  Take 500 mg by mouth every 8 (eight) hours as needed (for headaches or pain).   amiodarone  200 MG tablet Commonly known as: PACERONE  TAKE 1 TABLET BY MOUTH ONCE DAILY MONDAY THROUGH FRIDAY   apixaban  5 MG Tabs tablet Commonly known as: ELIQUIS  Take 1 tablet (5 mg total) by mouth 2 (two) times daily.   cefadroxil 500 MG capsule Commonly known as: DURICEF Take 1 capsule (500 mg  total) by mouth 2 (two) times daily for 5 days.   Entresto  24-26 MG Generic drug: sacubitril-valsartan TAKE 1 TABLET BY MOUTH TWICE DAILY   furosemide  20 MG tablet Commonly known as: LASIX  Take 1 tablet (20 mg total) by mouth 2 (two) times daily for 2 days, THEN 1 tablet (20 mg total) every Monday, Wednesday, and Friday. Take 1 tablet by mouth three times per week on Mondays, Wednesdays, and Fridays. Start taking on: May 05, 2024 What changed: See the new instructions.   ipratropium 0.06 % nasal spray Commonly known as: ATROVENT Place 2 sprays into both nostrils 4 (four) times daily as needed for rhinitis.   lactulose 10 GM/15ML solution Commonly known as: CHRONULAC Take 45 mLs (30 g total) by mouth daily as needed for mild constipation.   levothyroxine  150 MCG tablet Commonly known as: SYNTHROID  Take 150 mcg by mouth daily before breakfast.   potassium chloride  SA  20 MEQ tablet Commonly known as: KLOR-CON  M Take 1 tablet (20 mEq total) by mouth every Monday, Wednesday, and Friday. Start taking on: May 06, 2024 What changed:  how much to take when to take this   rosuvastatin  10 MG tablet Commonly known as: CRESTOR  Take 10 mg by mouth daily.        Allergies  Allergen Reactions   Hydrocodone  Nausea And Vomiting and Other (See Comments)    Dizziness and upset stomach   Oxycodone  Nausea And Vomiting and Nausea Only   Robaxin  [Methocarbamol ] Nausea And Vomiting   Ultram  [Tramadol ] Nausea And Vomiting    Discharge Exam: Vitals:   05/04/24 2214 05/05/24 0452  BP: 137/88 (!) 134/101  Pulse: 80 87  Resp: 18 20  Temp: 98.8 F (37.1 C) 98.2 F (36.8 C)  SpO2: 97% 97%   Weight Information (since admission)     Date/Time Weight Weight in lbs BSA (Calculated - sq m) BMI (Calculated) Who   05/05/24 0453 83.3 kg 183.7 lbs -- 24.91 JF   05/04/24 14:12:45 85.3 kg 188 lbs 2.08 sq meters 25.49 AF      Physical Exam Vitals and nursing note reviewed.  Constitutional:       General: He is not in acute distress.    Appearance: He is normal weight. He is not toxic-appearing or diaphoretic.  HENT:     Head: Normocephalic and atraumatic.     Nose: Nose normal.  Cardiovascular:     Rate and Rhythm: Normal rate and regular rhythm.  Pulmonary:     Effort: Pulmonary effort is normal. No respiratory distress.     Breath sounds: Normal breath sounds. No wheezing or rales.  Abdominal:     General: Abdomen is flat. Bowel sounds are normal. There is no distension.     Palpations: Abdomen is soft.     Tenderness: There is no abdominal tenderness.  Musculoskeletal:     Right lower leg: No edema.     Left lower leg: No edema.  Skin:    General: Skin is warm and dry.     Capillary Refill: Capillary refill takes less than 2 seconds.  Neurological:     Mental Status: He is alert and oriented to person, place, and time.     The results of significant diagnostics from this hospitalization (including imaging, microbiology, ancillary and laboratory) are listed below for reference.    Microbiology: Recent Results (from the past 240 hours)  Resp panel by RT-PCR (RSV, Flu A&B, Covid) Anterior Nasal Swab     Status: None   Collection Time: 05/04/24  7:12 AM   Specimen: Anterior Nasal Swab  Result Value Ref Range Status   SARS Coronavirus 2 by RT PCR NEGATIVE NEGATIVE Final    Comment: (NOTE) SARS-CoV-2 target nucleic acids are NOT DETECTED.  The SARS-CoV-2 RNA is generally detectable in upper respiratory specimens during the acute phase of infection. The lowest concentration of SARS-CoV-2 viral copies this assay can detect is 138 copies/mL. A negative result does not preclude SARS-Cov-2 infection and should not be used as the sole basis for treatment or other patient management decisions. A negative result may occur with  improper specimen collection/handling, submission of specimen other than nasopharyngeal swab, presence of viral mutation(s) within the areas  targeted by this assay, and inadequate number of viral copies(<138 copies/mL). A negative result must be combined with clinical observations, patient history, and epidemiological information. The expected result is Negative.  Fact Sheet for Patients:  BloggerCourse.com  Fact Sheet for Healthcare Providers:  SeriousBroker.it  This test is no t yet approved or cleared by the [place]  FDA and  has been authorized for detection and/or diagnosis of SARS-CoV-2 by FDA under an Emergency Use Authorization (EUA). This EUA will remain  in effect (meaning this test can be used) for the duration of the COVID-19 declaration under Section 564(b)(1) of the Act, 21 U.S.C.section 360bbb-3(b)(1), unless the authorization is terminated  or revoked sooner.       Influenza A by PCR NEGATIVE NEGATIVE Final   Influenza B by PCR NEGATIVE NEGATIVE Final    Comment: (NOTE) The Xpert Xpress SARS-CoV-2/FLU/RSV plus assay is intended as an aid in the diagnosis of influenza from Nasopharyngeal swab specimens and should not be used as a sole basis for treatment. Nasal washings and aspirates are unacceptable for Xpert Xpress SARS-CoV-2/FLU/RSV testing.  Fact Sheet for Patients: BloggerCourse.com  Fact Sheet for Healthcare Providers: SeriousBroker.it  This test is not yet approved or cleared by the [place]  FDA and has been authorized for detection and/or diagnosis of SARS-CoV-2 by FDA under an Emergency Use Authorization (EUA). This EUA will remain in effect (meaning this test can be used) for the duration of the COVID-19 declaration under Section 564(b)(1) of the Act, 21 U.S.C. section 360bbb-3(b)(1), unless the authorization is terminated or revoked.     Resp Syncytial Virus by PCR NEGATIVE NEGATIVE Final    Comment: (NOTE) Fact Sheet for  Patients: BloggerCourse.com  Fact Sheet for Healthcare Providers: SeriousBroker.it  This test is not yet approved or cleared by the [place]  FDA and has been authorized for detection and/or diagnosis of SARS-CoV-2 by FDA under an Emergency Use Authorization (EUA). This EUA will remain in effect (meaning this test can be used) for the duration of the COVID-19 declaration under Section 564(b)(1) of the Act, 21 U.S.C. section 360bbb-3(b)(1), unless the authorization is terminated or revoked.  Performed at [facility], [address] [place], [place] [address]      Labs: BNP (last 3 results) Recent Labs    02/04/24 1132 05/04/24 0659  BNP 791.2* 1,368.4*   Basic Metabolic Panel: Recent Labs  Lab 05/04/24 0659 05/05/24 0548  NA 139 138  K 3.5 3.3*  CL 108 103  CO2 24 26  GLUCOSE 109* 106*  BUN 18 19  CREATININE 1.41* 1.44*  CALCIUM  8.7* 9.1  MG 1.5*  --    Liver Function Tests: Recent Labs  Lab 05/04/24 0659  AST 56*  ALT 50*  ALKPHOS 56  BILITOT 1.0  PROT 6.8  ALBUMIN 3.7   CBC: Recent Labs  Lab 05/04/24 0659 05/05/24 0548  WBC 9.0 7.9  NEUTROABS 7.1  --   HGB 12.1* 13.4  HCT 38.5* 42.7  MCV 113.9* 113.9*  PLT 238 255   BNP: Recent Labs  Lab 05/04/24 0659  BNP 1,368.4*   Thyroid function studies Recent Labs    05/04/24 0703  TSH 5.123*   Sepsis Labs Recent Labs  Lab 05/04/24 0659 05/05/24 0548  WBC 9.0 7.9    Procedures/Studies: DG Chest Port 1 View Result Date: 05/04/2024 CLINICAL DATA:  Dyspnea.  Shortness of breath and chest tightness. EXAM: PORTABLE CHEST 1 VIEW COMPARISON:  02/04/2024 FINDINGS: Mild cardiac enlargement. Increased interstitial markings with a lower lung zone predominance is new from previous exam. No significant pleural effusion, pneumothorax or airspace consolidation. The visualized osseous structures are within normal limits. IMPRESSION:  Increased interstitial markings with a lower lung zone predominance.  Differential considerations include interstitial edema versus atypical infection. Electronically Signed   By: Kimberley Penman M.D.   On: 05/04/2024 07:51    Time coordinating discharge: 55 mins  SIGNED:  Unk Garb, DO [facility] 05/05/24, 2:47 PM

## 2024-05-05 NOTE — Progress Notes (Signed)
 PROGRESS NOTE    Thomas [name]  WNU:[id number] DOB: 20-Nov-1941 DOA: 05/04/2024 PCP: [name], MD  Subjective: Pt seen and examined. Pacing around his room. On RA. No LE edema. Pt requesting to go home.   Hospital Course: HPI: Thomas [name] is a 83 y.o. male with medical history significant for CAD, paroxysmal atrial fibrillation on Eliquis , hypertension, hyperlipidemia, combined heart failure being admitted to the hospital with acute on chronic systolic and diastolic congestive heart failure.  Patient states he was in his usual state of health, compliant with all of his medications including amiodarone  and Lasix  20 mg p.o. daily when he woke up at about 4 AM today short of breath.  He denies any orthopnea, chest pain, he does endorse some dyspnea with exertion over the last few days.  No fevers, chills, nausea or vomiting.   Significant Events: Admitted 05/04/2024 on acute on chronic systolic/diastolic CHF   Admission Labs: VBG pH 7.39, PCO2 of 45 Mg 1.5 Na 139, K 3.5, CO2 of 24, BUN 18, Scr 1.41, glu 109 BNP 1368 WBC 9.0, HgB 12.1, plt 238 TSH 5.123 Covid/flu/rsv negative  Admission Imaging Studies: CXR Increased interstitial markings with a lower lung zone predominance. Differential considerations include interstitial edema versus atypical infection  Significant Labs:   Significant Imaging Studies: CXR Increased interstitial markings with a lower lung zone predominance. Differential considerations include interstitial edema versus atypical infection.  Antibiotic Therapy: Anti-infectives (From admission, onward)    None       Procedures:   Consultants:     Assessment and Plan: * Acute on chronic combined systolic and diastolic CHF (congestive heart failure) (HCC) 05-05-2024 pt already weaned to RA. Pt has already walked 450 feet with mobility specialist on RA. Pt has supply of lasix  at home. Pt requesting DC to home. Dc to home with lasix  20 mg  bid x 2 days, then go back to 20 mg on M, W, F.  Continue with his GDMT. 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  Constipation 05-05-2024 pt c/o of constipation. In order to prevent him from having fecal incontinence while he is riding home in his car, will delay laxative until he gets home. Prn lactulose. [facility].  Dysuria 05-05-2024 will obtain UA prior to DC. Rx duricef but asked pt to not take Rx until I call him at home with his UA results.  Given holiday weekend, most pharmacies will not be open on Memorial day in case he does have a UTI.  Benign hypertension 05-05-2024 controlled with his CHF med regimen.  Stage 3b chronic kidney disease (CKD) (HCC) 05-05-2024 DC Scr of 1.44  Hypothyroidism, acquired 05-05-2024 stable.  BPH (benign prostatic hyperplasia) 05-05-2024 stable.  Hyperlipidemia 05-05-2024 on crestor   CAD (coronary artery disease) 05-05-2024 stable. On crestor .  DVT prophylaxis:  apixaban  (ELIQUIS ) tablet 5 mg     Code Status: Full Code Family Communication: discussed with pt , pt's dtr(wendy) and pt's son-in-law at bedside Disposition Plan: return home Reason for continuing need for hospitalization: stable for DC.  Objective: Vitals:   05/04/24 2105 05/04/24 2214 05/05/24 0452 05/05/24 0453  BP: 131/78 137/88 (!) 134/101   Pulse: 82 80 87   Resp: 20 18 20    Temp: 97.8 F (36.6 C) 98.8 F (37.1 C) 98.2 F (36.8 C)   TempSrc: Oral Oral    SpO2: 98% 97% 97%   Weight:  83.3 kg  Height:        Intake/Output Summary (Last 24 hours) at 05/05/2024 1446 Last data filed at 05/05/2024 0900 Gross per 24 hour  Intake 720 ml  Output 380 ml  Net 340 ml   Filed Weights   05/04/24 1412 05/05/24 0453  Weight: 85.3 kg 83.3 kg    Examination:  Physical Exam Vitals and nursing note reviewed.  Constitutional:      General: He is not in acute distress.    Appearance:  He is normal weight. He is not toxic-appearing or diaphoretic.  HENT:     Head: Normocephalic and atraumatic.     Nose: Nose normal.  Cardiovascular:     Rate and Rhythm: Normal rate and regular rhythm.  Pulmonary:     Effort: Pulmonary effort is normal. No respiratory distress.     Breath sounds: Normal breath sounds. No wheezing or rales.  Abdominal:     General: Abdomen is flat. Bowel sounds are normal. There is no distension.     Palpations: Abdomen is soft.     Tenderness: There is no abdominal tenderness.  Musculoskeletal:     Right lower leg: No edema.     Left lower leg: No edema.  Skin:    General: Skin is warm and dry.     Capillary Refill: Capillary refill takes less than 2 seconds.  Neurological:     Mental Status: He is alert and oriented to person, place, and time.     Data Reviewed: I have personally reviewed following labs and imaging studies  CBC: Recent Labs  Lab 05/04/24 0659 05/05/24 0548  WBC 9.0 7.9  NEUTROABS 7.1  --   HGB 12.1* 13.4  HCT 38.5* 42.7  MCV 113.9* 113.9*  PLT 238 255   Basic Metabolic Panel: Recent Labs  Lab 05/04/24 0659 05/05/24 0548  NA 139 138  K 3.5 3.3*  CL 108 103  CO2 24 26  GLUCOSE 109* 106*  BUN 18 19  CREATININE 1.41* 1.44*  CALCIUM  8.7* 9.1  MG 1.5*  --    GFR: Estimated Creatinine Clearance: 44.2 mL/min (A) (by C-G formula based on SCr of 1.44 mg/dL (H)). Liver Function Tests: Recent Labs  Lab 05/04/24 0659  AST 56*  ALT 50*  ALKPHOS 56  BILITOT 1.0  PROT 6.8  ALBUMIN 3.7   BNP (last 3 results) Recent Labs    02/04/24 1132 05/04/24 0659  BNP 791.2* 1,368.4*   Thyroid Function Tests: Recent Labs    05/04/24 0703  TSH 5.123*    Recent Results (from the past 240 hours)  Resp panel by RT-PCR (RSV, Flu A&B, Covid) Anterior Nasal Swab     Status: None   Collection Time: 05/04/24  7:12 AM   Specimen: Anterior Nasal Swab  Result Value Ref Range Status   SARS Coronavirus 2 by RT PCR NEGATIVE  NEGATIVE Final    Comment: (NOTE) SARS-CoV-2 target nucleic acids are NOT DETECTED.  The SARS-CoV-2 RNA is generally detectable in upper respiratory specimens during the acute phase of infection. The lowest concentration of SARS-CoV-2 viral copies this assay can detect is 138 copies/mL. A negative result does not preclude SARS-Cov-2 infection and should not be used as the sole basis for treatment or other patient management decisions. A negative result may occur with  improper specimen collection/handling, submission of specimen other than nasopharyngeal swab, presence of viral mutation(s) within the areas targeted by this assay, and inadequate number of viral copies(<138 copies/mL). A negative  result must be combined with clinical observations, patient history, and epidemiological information. The expected result is Negative.  Fact Sheet for Patients:  BloggerCourse.com  Fact Sheet for Healthcare Providers:  SeriousBroker.it  This test is no t yet approved or cleared by the [place]  FDA and  has been authorized for detection and/or diagnosis of SARS-CoV-2 by FDA under an Emergency Use Authorization (EUA). This EUA will remain  in effect (meaning this test can be used) for the duration of the COVID-19 declaration under Section 564(b)(1) of the Act, 21 U.S.C.section 360bbb-3(b)(1), unless the authorization is terminated  or revoked sooner.       Influenza A by PCR NEGATIVE NEGATIVE Final   Influenza B by PCR NEGATIVE NEGATIVE Final    Comment: (NOTE) The Xpert Xpress SARS-CoV-2/FLU/RSV plus assay is intended as an aid in the diagnosis of influenza from Nasopharyngeal swab specimens and should not be used as a sole basis for treatment. Nasal washings and aspirates are unacceptable for Xpert Xpress SARS-CoV-2/FLU/RSV testing.  Fact Sheet for Patients: BloggerCourse.com  Fact Sheet for Healthcare  Providers: SeriousBroker.it  This test is not yet approved or cleared by the [place]  FDA and has been authorized for detection and/or diagnosis of SARS-CoV-2 by FDA under an Emergency Use Authorization (EUA). This EUA will remain in effect (meaning this test can be used) for the duration of the COVID-19 declaration under Section 564(b)(1) of the Act, 21 U.S.C. section 360bbb-3(b)(1), unless the authorization is terminated or revoked.     Resp Syncytial Virus by PCR NEGATIVE NEGATIVE Final    Comment: (NOTE) Fact Sheet for Patients: BloggerCourse.com  Fact Sheet for Healthcare Providers: SeriousBroker.it  This test is not yet approved or cleared by the [place]  FDA and has been authorized for detection and/or diagnosis of SARS-CoV-2 by FDA under an Emergency Use Authorization (EUA). This EUA will remain in effect (meaning this test can be used) for the duration of the COVID-19 declaration under Section 564(b)(1) of the Act, 21 U.S.C. section 360bbb-3(b)(1), unless the authorization is terminated or revoked.  Performed at [facility], [address], [place], [place] [address]      Radiology Studies: [facility] Chest Port 1 View Result Date: 05/04/2024 CLINICAL DATA:  Dyspnea.  Shortness of breath and chest tightness. EXAM: PORTABLE CHEST 1 VIEW COMPARISON:  02/04/2024 FINDINGS: Mild cardiac enlargement. Increased interstitial markings with a lower lung zone predominance is new from previous exam. No significant pleural effusion, pneumothorax or airspace consolidation. The visualized osseous structures are within normal limits. IMPRESSION: Increased interstitial markings with a lower lung zone predominance. Differential considerations include interstitial edema versus atypical infection. Electronically Signed   By: Kimberley Penman M.D.   On: 05/04/2024 07:51    Scheduled Meds:  [START  ON 05/06/2024] amiodarone   200 mg Oral Once per day on Monday Tuesday Wednesday Thursday Friday   apixaban   5 mg Oral BID   furosemide   40 mg Intravenous Daily   levothyroxine   150 mcg Oral QAC breakfast   potassium chloride  SA  20 mEq Oral Daily   potassium chloride   40 mEq Oral Q4H   rosuvastatin   10 mg Oral Daily   sacubitril-valsartan  1 tablet Oral BID   Continuous Infusions:   LOS: 1 day   Time spent: 50 minutes  Unk Garb, DO  [facility]  05/05/2024, 2:46 PM

## 2024-05-05 NOTE — Care Management CC44 (Signed)
 Condition Code 44 Documentation Completed  Patient Details  Name: Thomas [name] MRN: [id number] Date of Birth: [birth year]   Condition Code 44 given:  Yes Patient signature on Condition Code 44 notice:  Yes Documentation of 2 MD's agreement:  Yes Code 44 added to claim:  Yes    [name], RN 05/05/2024, 3:39 PM

## 2024-05-05 NOTE — Hospital Course (Addendum)
 HPI: Thomas [name] is a 83 y.o. male with medical history significant for CAD, paroxysmal atrial fibrillation on Eliquis , hypertension, hyperlipidemia, combined heart failure being admitted to the hospital with acute on chronic systolic and diastolic congestive heart failure.  Patient states he was in his usual state of health, compliant with all of his medications including amiodarone  and Lasix  20 mg p.o. daily when he woke up at about 4 AM today short of breath.  He denies any orthopnea, chest pain, he does endorse some dyspnea with exertion over the last few days.  No fevers, chills, nausea or vomiting.   Significant Events: Admitted 05/04/2024 on acute on chronic systolic/diastolic CHF   Admission Labs: VBG pH 7.39, PCO2 of 45 Mg 1.5 Na 139, K 3.5, CO2 of 24, BUN 18, Scr 1.41, glu 109 BNP 1368 WBC 9.0, HgB 12.1, plt 238 TSH 5.123 Covid/flu/rsv negative  Admission Imaging Studies: CXR Increased interstitial markings with a lower lung zone predominance. Differential considerations include interstitial edema versus atypical infection  Significant Labs:   Significant Imaging Studies: CXR Increased interstitial markings with a lower lung zone predominance. Differential considerations include interstitial edema versus atypical infection.  Antibiotic Therapy: Anti-infectives (From admission, onward)    None       Procedures:   Consultants:

## 2024-05-05 NOTE — Progress Notes (Signed)
 Mobility Specialist - Progress Note   05/05/24 1347  Mobility  Activity Ambulated independently in hallway  Level of Assistance Independent after set-up  Assistive Device None  Distance Ambulated (ft) 450 ft  Range of Motion/Exercises Active  Activity Response Tolerated well  Mobility Referral Yes  Mobility visit 1 Mobility  Mobility Specialist Start Time (ACUTE ONLY) 1337  Mobility Specialist Stop Time (ACUTE ONLY) 1347  Mobility Specialist Time Calculation (min) (ACUTE ONLY) 10 min   Pt was found in room and agreeable to ambulate. Requesting something to have a BM and notified RN. Returned to room with all needs met. Family in room.  Lorna Rose,  Mobility Specialist Can be reached via Secure Chat

## 2024-05-05 NOTE — Subjective & Objective (Signed)
 Pt seen and examined. Pacing around his room. On RA. No LE edema. Pt requesting to go home.

## 2024-05-05 NOTE — Assessment & Plan Note (Signed)
 05-05-2024 stable. On crestor .

## 2024-05-05 NOTE — Assessment & Plan Note (Signed)
 05-05-2024 stable

## 2024-05-05 NOTE — Care Management Obs Status (Signed)
 MEDICARE OBSERVATION STATUS NOTIFICATION   Patient Details  Name: Thomas [name] MRN: [id number] Date of Birth: 04-24-1941   Medicare Observation Status Notification Given:  Yes    [name], RN 05/05/2024, 3:38 PM

## 2024-05-05 NOTE — Assessment & Plan Note (Signed)
 05-05-2024 pt c/o of constipation. In order to prevent him from having fecal incontinence while he is riding home in his car, will delay laxative until he gets home. Prn lactulose. Pt [facility] home.

## 2024-05-05 NOTE — Assessment & Plan Note (Signed)
 05-05-2024 on crestor 

## 2024-05-05 NOTE — Assessment & Plan Note (Signed)
 05-05-2024 DC Scr of 1.44

## 2024-05-05 NOTE — Assessment & Plan Note (Addendum)
 05-05-2024 pt already weaned to RA. Pt has already walked 450 feet with mobility specialist on RA. Pt has supply of lasix  at home. Pt requesting DC to home. Dc to home with lasix  20 mg bid x 2 days, then go back to 20 mg on M, W, F.  Continue with his GDMT. 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

## 2024-05-05 NOTE — Assessment & Plan Note (Signed)
 05-05-2024 controlled with his CHF med regimen.

## 2024-05-05 NOTE — Progress Notes (Signed)
   UA negative. Called pt to tell him do NOT take abx.  Unk Garb, DO [facility]

## 2024-05-05 NOTE — TOC Initial Note (Signed)
 Transition of Care [facility]) - Initial/Assessment Note    Patient Details  Name: Thomas [name] MRN: [id number] Date of Birth: 11/04/1941  [facility] CM/SW Contact:    [name], RN Phone Number: 05/05/2024, 3:41 PM  Clinical Narrative:                 [name] w/ pt, spouse [name], and family in room; pt says he lives at home; his wife is his POC; family will provide transportation; pt verified insurance/PCP; he denied SDOH risks; pt cane; he does not have HH services, or home oxygen; no TOC needs.  Expected Discharge Plan: Home/Self Care Barriers to Discharge: No Barriers Identified   Patient Goals and CMS Choice Patient states their goals for this hospitalization and ongoing recovery are:: home CMS Medicare.gov Compare Post Acute Care list provided to:: Patient   [place] ownership interest in [facility].provided to:: Patient    Expected Discharge Plan and Services   Discharge Planning Services: CM Consult Post Acute Care Choice: NA Living arrangements for the past 2 months: Single Family Home Expected Discharge Date: 05/05/24               DME Arranged: N/A DME Agency: NA       HH Arranged: NA HH Agency: NA        Prior Living Arrangements/Services Living arrangements for the past 2 months: Single Family Home Lives with:: Spouse Patient language and need for interpreter reviewed:: Yes Do you feel safe going back to the place where you live?: Yes      Need for Family Participation in Patient Care: Yes (Comment) Care giver support system in place?: Yes (comment) Current home services: DME (cane) Criminal Activity/Legal Involvement Pertinent to Current Situation/Hospitalization: No - Comment as needed  Activities of Daily Living   ADL Screening (condition at time of admission) Independently performs ADLs?: Yes (appropriate for developmental age) Is the patient deaf or have difficulty hearing?: Yes Does the patient have  difficulty seeing, even when wearing glasses/contacts?: No Does the patient have difficulty concentrating, remembering, or making decisions?: No  Permission Sought/Granted Permission sought to share information with : Case Manager Permission granted to share information with : Yes, Verbal Permission Granted  Share Information with NAME: Case Manager     Permission granted to share info w Relationship: [name] (spouse) [phone]     Emotional Assessment Appearance:: Appears stated age Attitude/Demeanor/Rapport: Gracious Affect (typically observed): Accepting Orientation: : Oriented to Self, Oriented to Place, Oriented to  Time, Oriented to Situation Alcohol / Substance Use: Not Applicable Psych Involvement: No (comment)  Admission diagnosis:  Acute CHF (congestive heart failure) (HCC) [I50.9] Acute respiratory failure with hypoxia (HCC) [J96.01] Acute on chronic congestive heart failure, unspecified heart failure type [facility]) [I50.9] Patient Active Problem List   Diagnosis Date Noted   Dysuria 05/05/2024   Constipation 05/05/2024   Acute CHF (congestive heart failure) (HCC) 05/05/2024   Acute on chronic combined systolic and diastolic CHF (congestive heart failure) (HCC) 05/04/2024   Atherosclerosis of native coronary artery of native heart without angina pectoris 02/04/2024   BPH (benign prostatic hyperplasia) 10/26/2022   Hypothyroidism, acquired 10/26/2022   Stage 3b chronic kidney disease (CKD) (HCC) 10/26/2022   Pulmonary nodules 10/26/2022   PVC's (premature ventricular contractions) 07/02/2019   OA (osteoarthritis) of hip 10/28/2013   CAD (coronary artery disease) 05/13/2011   Hyperlipidemia 05/13/2011   Benign hypertension 05/13/2011   PCP:  [name], MD Pharmacy:  [facility] - [place], [place] - [address] [place] [address] Phone: [phone] Fax: [phone]  [id number] [place], [place] - [address] [place] [address] Phone: [phone] Fax: [phone]     Social Drivers of Health (SDOH) Social History: SDOH Screenings   Food Insecurity: No Food Insecurity (05/05/2024)  Housing: Low Risk  (05/05/2024)  Transportation Needs: No Transportation Needs (05/05/2024)  Utilities: Not At Risk (05/05/2024)  Social Connections: Moderately Integrated (05/04/2024)  Tobacco Use: Medium Risk (05/04/2024)   SDOH Interventions: Food Insecurity Interventions: Intervention Not Indicated, Inpatient TOC Housing Interventions: Intervention Not Indicated, Inpatient TOC Transportation Interventions: Intervention Not Indicated, Inpatient TOC Utilities Interventions: Intervention Not Indicated, Inpatient TOC   Readmission Risk Interventions     No data to display

## 2024-05-12 ENCOUNTER — Emergency Department (HOSPITAL_COMMUNITY)
Admission: EM | Admit: 2024-05-12 | Discharge: 2024-05-12 | Disposition: A | Discharge status: Home / Self Care | Attending: Emergency Medicine | Admitting: Emergency Medicine

## 2024-05-12 DIAGNOSIS — R339 Retention of urine, unspecified: Secondary | ICD-10-CM | POA: Insufficient documentation

## 2024-05-12 DIAGNOSIS — Z7901 Long term (current) use of anticoagulants: Secondary | ICD-10-CM | POA: Insufficient documentation

## 2024-05-12 LAB — URINALYSIS, ROUTINE W REFLEX MICROSCOPIC
Bilirubin Urine: NEGATIVE
Glucose, UA: NEGATIVE mg/dL
Ketones, ur: NEGATIVE mg/dL
Leukocytes,Ua: NEGATIVE
Nitrite: NEGATIVE
Protein, ur: NEGATIVE mg/dL
Specific Gravity, Urine: 1.008 (ref 1.005–1.030)
pH: 5 (ref 5.0–8.0)

## 2024-05-12 NOTE — ED Notes (Signed)
 Urine specimen sent to lab

## 2024-05-12 NOTE — ED Provider Notes (Signed)
 [place] EMERGENCY DEPARTMENT AT [facility] Provider Note   CSN: [id number] Arrival date & time: 05/12/24  1047     History  Chief Complaint  Patient presents with   Urinary Retention    Thomas [name] is a 83 y.o. male.  HPI Patient reports he is had a lot of difficulty urinating for almost a week.  He reports he is only getting very small dribbles out.  He is having a lot of lower abdominal discomfort.  No fevers no chills no nausea no vomiting.  He saw his primary care doctor several days ago and was started on an antibiotic which she has been taking for 2 days with no relief.  He did take laxatives after his hospitalization a week ago.  He was discharged with a prescription for Lactulos and enema.  Patient's daughter reports that since using those interventions patient had very large stool output and then has persisted now with liquidy frequent stool.  She does not think he is any longer having any issues with constipation.    Home Medications Prior to Admission medications   Medication Sig Start Date End Date Taking? Authorizing Provider  acetaminophen  (TYLENOL ) 500 MG tablet Take 500 mg by mouth every 8 (eight) hours as needed (for headaches or pain).    [provider]  amiodarone  (PACERONE ) 200 MG tablet TAKE 1 TABLET BY MOUTH ONCE DAILY MONDAY THROUGH FRIDAY 02/15/24   Tammie Fall, MD  apixaban  (ELIQUIS ) 5 MG TABS tablet Take 1 tablet (5 mg total) by mouth 2 (two) times daily. 02/14/24   Thomasena Fleming, NP  furosemide  (LASIX ) 20 MG tablet Take 1 tablet (20 mg total) by mouth 2 (two) times daily for 2 days, THEN 1 tablet (20 mg total) every Monday, Wednesday, and Friday. Take 1 tablet by mouth three times per week on Mondays, Wednesdays, and Fridays. 05/05/24 06/06/24  Unk Garb, DO  ipratropium (ATROVENT) 0.06 % nasal spray Place 2 sprays into both nostrils 4 (four) times daily as needed for rhinitis.    [provider]  lactulose  (CHRONULAC ) 10  GM/15ML solution Take 45 mLs (30 g total) by mouth daily as needed for mild constipation. 05/05/24   Unk Garb, DO  levothyroxine  (SYNTHROID , LEVOTHROID) 150 MCG tablet Take 150 mcg by mouth daily before breakfast. 01/04/18   [provider]  potassium chloride  SA (KLOR-CON  M) 20 MEQ tablet Take 1 tablet (20 mEq total) by mouth every Monday, Wednesday, and Friday. 05/06/24 06/05/24  Unk Garb, DO  rosuvastatin  (CRESTOR ) 10 MG tablet Take 10 mg by mouth daily.    [provider]  sacubitril -valsartan  (ENTRESTO ) 24-26 MG TAKE 1 TABLET BY MOUTH TWICE DAILY 04/22/24   Nahser, Lela Purple, MD      Allergies    Hydrocodone , Oxycodone , Robaxin  [methocarbamol ], and Ultram  [tramadol ]    Review of Systems   Review of Systems  Physical Exam Updated Vital Signs BP 130/72 (BP Location: Left Arm)   Pulse 95   Temp 97.7 F (36.5 C) (Oral)   Resp 16   SpO2 98%  Physical Exam Constitutional:      Comments: Patient is alert nontoxic.  Mental status clear.  No respiratory distress.  HENT:     Mouth/Throat:     Pharynx: Oropharynx is clear.  Cardiovascular:     Rate and Rhythm: Normal rate. Rhythm irregular.  Pulmonary:     Effort: Pulmonary effort is normal.     Breath sounds: Normal breath sounds.  Abdominal:  Comments: Abdomen is soft upper abdomen is nontender.  Patient has discomfort and fullness to palpation of the lower abdomen symmetrically in the suprapubic area.  Neurological:     General: No focal deficit present.  Psychiatric:        Mood and Affect: Mood normal.     ED Results / Procedures / Treatments   Labs (all labs ordered are listed, but only abnormal results are displayed) Labs Reviewed  URINALYSIS, ROUTINE W REFLEX MICROSCOPIC - Abnormal; Notable for the following components:      Result Value   Color, Urine STRAW (*)    Hgb urine dipstick MODERATE (*)    Bacteria, UA RARE (*)    All other components within normal limits     EKG None  Radiology No results found.  Procedures Procedures    Medications Ordered in ED Medications - No data to display  ED Course/ Medical Decision Making/ A&P                                 Medical Decision Making Amount and/or Complexity of Data Reviewed Labs: ordered.   Patient presents as outlined.  He has had drastically diminished urine output and suprapubic discomfort.  On examination he has palpable bladder distention.  Patient is nontoxic.  He has not had any fever or any vomiting.  Bedside bladder scan showed greater than 999 mL.  Foley catheter placed by nursing staff.  Patient has had output of clear urine.  He is much improved symptomatically.  Urinalysis negative.  At this time, findings consistent with urinary retention.  Symptoms are resolved with placement of Foley catheter.  I will discontinue antibiotics given patient had negative urinalysis and urinary retention as source of symptoms.  Patient had previously been successfully treated for constipation.  They will stop taking lactulose  at this time and only use Colace.  Patient has upcoming appointments with PCP and cardiology.  They will be calling urology tomorrow to schedule follow-up.        Final Clinical Impression(s) / ED Diagnoses Final diagnoses:  Urinary retention    Rx / DC Orders ED Discharge Orders     None         Wynetta Heckle, MD 05/12/24 1352

## 2024-05-12 NOTE — ED Notes (Signed)
 Placed foley per EDP Pfeiffer

## 2024-05-12 NOTE — ED Triage Notes (Signed)
 Patient here from home reporting difficulty urinating. Reports hx of same. Antibiotic with no relief.

## 2024-05-12 NOTE — Discharge Instructions (Addendum)
 1.  A urinary catheter has been placed.  There are instructions in your discharge instructions for managing the catheter bag at home.  This catheter stays in place until you follow-up with your urologist. 2.  Call your urologist tomorrow to schedule a follow-up appointment as soon as possible.  Let them know that a catheter was placed while you are in the emergency department for urinary retention. 3.  At this time your urine does not show any signs of infection.  You may discontinue the antibiotics that you were previously taking. 4.  Continue all of your other regular medications as prescribed.  Go to all of your scheduled doctors appointments.

## 2024-05-14 NOTE — H&P (View-Only) (Signed)
 Electrophysiology Office Note:   Date:  05/15/2024  ID:  Thomas, [name] 07-Sep-1941, MRN [id number]  Primary Cardiologist: Ahmad Alert, MD Primary Heart Failure: None Electrophysiologist: Manya Sells, MD      History of Present Illness:   Thomas [name] is a 83 y.o. male with h/o PVC's, AF (new dx 01/2024), HLD, CAD  seen today for routine electrophysiology followup.   Since last being seen in our clinic the patient reports he has been having a hard time due to his prostate enlargement. He has a foley in place and is hopeful to get it out next week.  He reports he feels tired / fatigued. No significant palpitations.   He denies chest pain, palpitations, dyspnea, PND, orthopnea, nausea, vomiting, dizziness, syncope, edema, weight gain, or early satiety.   Review of systems complete and found to be negative unless listed in HPI.   EP Information / Studies Reviewed:    EKG is ordered today. Personal review as below.  EKG Interpretation Date/Time:  Wednesday May 15 2024 09:04:27 EDT Ventricular Rate:  93 PR Interval:    QRS Duration:  164 QT Interval:  422 QTC Calculation: 524 R Axis:   -77  Text Interpretation: Atrial fibrillation Wide QRS rhythm with Premature supraventricular complexes Right bundle branch block Left anterior fascicular block Bifascicular block PREVIOUS ECG IS PRESENT Confirmed by Creighton Doffing ([id number]) on 05/15/2024 9:36:25 AM   Studies:  ECHO 10/2022 > LVEF 40-45%, GII DD R/LHC 10/2022 > PAP-mean 33/14-18 mmHg. PCWP 16/14-12 mmHg & LVEDP 12 mmHg (Well Compensated), Widely patent ostial LAD overlapping stents with no obstruction of flow in the LCx.  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 The patient's score is based upon: CHF History: 1 HTN  History: 0 Diabetes History: 0 Stroke History: 0 Vascular Disease History: 1 Age Score: 2 Gender Score: 0             Physical Exam:   VS:  BP 100/62   Pulse 60   Ht 6' (1.829 m)   Wt 178 lb (80.7 kg)   SpO2 95%   BMI 24.14 kg/m    Wt Readings from Last 3 Encounters:  05/15/24 178 lb (80.7 kg)  05/05/24 183 lb 11.2 oz (83.3 kg)  02/14/24 190 lb 3.2 oz (86.3 kg)     GEN: elderly male, well developed in no acute distress NECK: No JVD; No carotid bruits CARDIAC: Regular rate and rhythm, no murmurs, rubs, gallops RESPIRATORY:  Clear to auscultation without rales, wheezing or rhonchi  ABDOMEN: Soft, non-tender, non-distended EXTREMITIES:  No edema; No deformity   ASSESSMENT AND PLAN:    Paroxysmal Atrial Fibrillation  New onset 01/2024 in the setting of influenza -EKG with AF, controlled V rate  -amiodarone  200 mg M-F  -plan for DCCV for AF in effort to restore NSR -update TSH, free T4, LFT's for amiodarone , BMP/CBC for pre-procedure labs  -[facility] for stroke prophylaxis, discussed importance of no missed doses of OAC with pending DCCV  Secondary Hypercoagulable State  -continue Eliquis  5mg  BID, dose reviewed and appropriate by wt / Cr   HFrEF  -GDMT per Cardiology  Follow up with EP APP in 4 weeks post DCCV   Informed Consent   Shared Decision Making/Informed Consent The risks (  stroke, cardiac arrhythmias rarely resulting in the need for a temporary or permanent pacemaker, skin irritation or burns and complications associated with conscious sedation including aspiration, arrhythmia, respiratory failure and death), benefits (restoration of normal sinus rhythm) and alternatives of a direct current cardioversion were explained in detail to Thomas [name] and he agrees to proceed.        Signed, Creighton Doffing, NP-C, AGACNP-BC [facility] - Electrophysiology  05/15/2024, 9:38 AM

## 2024-05-14 NOTE — Progress Notes (Unsigned)
 Electrophysiology Office Note:   Date:  05/15/2024  ID:  Daune, Colgate 08-Nov-1941, MRN [id number]  Primary Cardiologist: Ahmad Alert, MD Primary Heart Failure: None Electrophysiologist: Manya Sells, MD      History of Present Illness:   Thomas [name] is a 83 y.o. male with h/o PVC's, AF (new dx 01/2024), HLD, CAD  seen today for routine electrophysiology followup.   Since last being seen in our clinic the patient reports he has been having a hard time due to his prostate enlargement. He has a foley in place and is hopeful to get it out next week.  He reports he feels tired / fatigued. No significant palpitations.   He denies chest pain, palpitations, dyspnea, PND, orthopnea, nausea, vomiting, dizziness, syncope, edema, weight gain, or early satiety.   Review of systems complete and found to be negative unless listed in HPI.   EP Information / Studies Reviewed:    EKG is ordered today. Personal review as below.  EKG Interpretation Date/Time:  Wednesday May 15 2024 09:04:27 EDT Ventricular Rate:  93 PR Interval:    QRS Duration:  164 QT Interval:  422 QTC Calculation: 524 R Axis:   -77  Text Interpretation: Atrial fibrillation Wide QRS rhythm with Premature supraventricular complexes Right bundle branch block Left anterior fascicular block Bifascicular block PREVIOUS ECG IS PRESENT Confirmed by Creighton Doffing ([id number]) on 05/15/2024 9:36:25 AM   Studies:  ECHO 10/2022 > LVEF 40-45%, GII DD R/LHC 10/2022 > PAP-mean 33/14-18 mmHg. PCWP 16/14-12 mmHg & LVEDP 12 mmHg (Well Compensated), Widely patent ostial LAD overlapping stents with no obstruction of flow in the LCx.  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 The patient's score is based upon: CHF History: 1 HTN  History: 0 Diabetes History: 0 Stroke History: 0 Vascular Disease History: 1 Age Score: 2 Gender Score: 0             Physical Exam:   VS:  BP 100/62   Pulse 60   Ht 6' (1.829 m)   Wt 178 lb (80.7 kg)   SpO2 95%   BMI 24.14 kg/m    Wt Readings from Last 3 Encounters:  05/15/24 178 lb (80.7 kg)  05/05/24 183 lb 11.2 oz (83.3 kg)  02/14/24 190 lb 3.2 oz (86.3 kg)     GEN: elderly male, well developed in no acute distress NECK: No JVD; No carotid bruits CARDIAC: Regular rate and rhythm, no murmurs, rubs, gallops RESPIRATORY:  Clear to auscultation without rales, wheezing or rhonchi  ABDOMEN: Soft, non-tender, non-distended EXTREMITIES:  No edema; No deformity   ASSESSMENT AND PLAN:    Paroxysmal Atrial Fibrillation  New onset 01/2024 in the setting of influenza -EKG with AF, controlled V rate  -amiodarone  200 mg M-F  -plan for DCCV for AF in effort to restore NSR -update TSH, free T4, LFT's for amiodarone , BMP/CBC for pre-procedure labs  -OAC for stroke prophylaxis, discussed importance of no missed doses of OAC with pending DCCV  Secondary Hypercoagulable State  -continue Eliquis  5mg  BID, dose reviewed and appropriate by wt / Cr   HFrEF  -GDMT per Cardiology  Follow up with EP APP in 4 weeks post DCCV   Informed Consent   Shared Decision Making/Informed Consent The risks (  stroke, cardiac arrhythmias rarely resulting in the need for a temporary or permanent pacemaker, skin irritation or burns and complications associated with conscious sedation including aspiration, arrhythmia, respiratory failure and death), benefits (restoration of normal sinus rhythm) and alternatives of a direct current cardioversion were explained in detail to Mr. Fikes and he agrees to proceed.        Signed, Creighton Doffing, NP-C, AGACNP-BC [facility] - Electrophysiology  05/15/2024, 9:38 AM

## 2024-05-15 ENCOUNTER — Encounter: Payer: Self-pay | Admitting: Pulmonary Disease

## 2024-05-15 ENCOUNTER — Ambulatory Visit: Attending: Pulmonary Disease | Admitting: Pulmonary Disease

## 2024-05-15 VITALS — BP 100/62 | HR 60 | Ht 72.0 in | Wt 178.0 lb

## 2024-05-15 DIAGNOSIS — I480 Paroxysmal atrial fibrillation: Secondary | ICD-10-CM

## 2024-05-15 DIAGNOSIS — I493 Ventricular premature depolarization: Secondary | ICD-10-CM

## 2024-05-15 DIAGNOSIS — D6869 Other thrombophilia: Secondary | ICD-10-CM

## 2024-05-15 NOTE — Patient Instructions (Addendum)
 Medication Instructions:  No medication changes were made during today's visit.  *If you need a refill on your cardiac medications before your next appointment, please call your pharmacy*   Lab Work: Labs will be drawn today for upcoming procedure....................BMP  If you have labs (blood work) drawn today and your tests are completely normal, you will receive your results only by: MyChart Message (if you have MyChart) OR A paper copy in the mail If you have any lab test that is abnormal or we need to change your treatment, we will call you to review the results.   Testing/Procedures: Your physician has recommended that you have a Cardioversion (DCCV). Electrical Cardioversion uses a jolt of electricity to your heart either through paddles or wired patches attached to your chest. This is a controlled, usually prescheduled, procedure. Defibrillation is done under light anesthesia in the hospital, and you usually go home the day of the procedure. This is done to get your heart back into a normal rhythm. You are not awake for the procedure. Please see the instruction sheet given to you today.      Follow-Up: At [facility], you and your health needs are our priority.  As part of our continuing mission to provide you with exceptional heart care, we have created designated Provider Care Teams.  These Care Teams include your primary Cardiologist (physician) and Advanced Practice Providers (APPs -  Physician Assistants and Nurse Practitioners) who all work together to provide you with the care you need, when you need it.   Your next appointment:   Follow up after Cardioversion      Other Instructions   Dear Thomas [name]  You are scheduled for a Cardioversion on Friday, June 6 with Dr. Renna Cary.  Please arrive at the [facility] (Main Entrance A) at [facility]: [address] [place], [place] [address] at 10:00 AM (This time is 1 hour(s) before your procedure to  ensure your preparation).   Free valet parking service is available. You will check in at ADMITTING.   *Please Note: You will receive a call the day before your procedure to confirm the appointment time. That time may have changed from the original time based on the schedule for that day.*   DIET:  Nothing to eat or drink after midnight except a sip of water  with medications (see medication instructions below)  MEDICATION INSTRUCTIONS: !!IF ANY NEW MEDICATIONS ARE STARTED AFTER TODAY, PLEASE NOTIFY YOUR PROVIDER AS SOON AS POSSIBLE!!  FYI: Medications such as Semaglutide (Ozempic, [place]), Tirzepatide (Mounjaro, Zepbound), Dulaglutide (Trulicity), etc ("GLP1 agonists") AND Canagliflozin (Invokana), Dapagliflozin (Farxiga), Empagliflozin (Jardiance), Ertugliflozin (Steglatro), Bexagliflozin [employer]) or any combination with one of these drugs such as Invokamet (Canagliflozin/Metformin), Synjardy (Empagliflozin/Metformin), etc ("SGLT2 inhibitors") must be held around the time of a procedure. This is not a comprehensive list of all of these drugs. Please review all of your medications and talk to your provider if you take any one of these. If you are not sure, ask your provider.  DO NOT TAKE furosemide  the morning of Cardioversion  Continue taking your anticoagulant (blood thinner): Apixaban  (Eliquis ).  You will need to continue this after your procedure until you are told by your provider that it is safe to stop.    LABS:  BMP   FYI:  For your safety, and to allow us  to monitor your vital signs accurately during the surgery/procedure we request: If you have artificial nails, gel coating, SNS etc, please have those removed  prior to your surgery/procedure. Not having the nail coverings /polish removed may result in cancellation or delay of your surgery/procedure.  Your support person will be asked to wait in the waiting room during your procedure.  It is OK to have someone drop you off and come  back when you are ready to be discharged.  You cannot drive after the procedure and will need someone to drive you home.  Bring your insurance cards.  *Special Note: Every effort is made to have your procedure done on time. Occasionally there are emergencies that occur at the hospital that may cause delays. Please be patient if a delay does occur.     Thank you for choosing [facility]!

## 2024-05-16 LAB — BASIC METABOLIC PANEL WITH GFR
BUN/Creatinine Ratio: 7 — ABNORMAL LOW (ref 10–24)
BUN: 17 mg/dL (ref 8–27)
CO2: 20 mmol/L (ref 20–29)
Calcium: 9.8 mg/dL (ref 8.6–10.2)
Chloride: 98 mmol/L (ref 96–106)
Creatinine, Ser: 2.39 mg/dL — ABNORMAL HIGH (ref 0.76–1.27)
Glucose: 98 mg/dL (ref 70–99)
Potassium: 4.7 mmol/L (ref 3.5–5.2)
Sodium: 140 mmol/L (ref 134–144)
eGFR: 27 mL/min/{1.73_m2} — ABNORMAL LOW (ref >59)

## 2024-05-16 LAB — LAB REPORT - SCANNED
Albumin, Urine POC: 206.1
Creatinine, POC: 113.6 mg/dL
Microalb Creat Ratio: 181

## 2024-05-16 NOTE — Progress Notes (Signed)
 Spoke to patient and instructed them to come at 1000  and to be NPO after 0000.  Medications reviewed.    Confirmed that patient will have a ride home and someone to stay with them for 24 hours after the procedure.

## 2024-05-17 ENCOUNTER — Ambulatory Visit (HOSPITAL_COMMUNITY)
Admission: RE | Admit: 2024-05-17 | Discharge: 2024-05-17 | Disposition: A | Discharge status: Home / Self Care | Attending: Cardiology | Admitting: Cardiology

## 2024-05-17 ENCOUNTER — Encounter (HOSPITAL_COMMUNITY)
Admission: RE | Disposition: A | Discharge status: Home / Self Care | Payer: Self-pay | Source: Home / Self Care | Attending: Cardiology

## 2024-05-17 ENCOUNTER — Ambulatory Visit (HOSPITAL_COMMUNITY): Admitting: Anesthesiology

## 2024-05-17 ENCOUNTER — Other Ambulatory Visit: Payer: Self-pay

## 2024-05-17 ENCOUNTER — Ambulatory Visit: Admitting: Pulmonary Disease

## 2024-05-17 DIAGNOSIS — I5043 Acute on chronic combined systolic (congestive) and diastolic (congestive) heart failure: Secondary | ICD-10-CM

## 2024-05-17 DIAGNOSIS — I2510 Atherosclerotic heart disease of native coronary artery without angina pectoris: Secondary | ICD-10-CM

## 2024-05-17 DIAGNOSIS — Z79899 Other long term (current) drug therapy: Secondary | ICD-10-CM | POA: Insufficient documentation

## 2024-05-17 DIAGNOSIS — K219 Gastro-esophageal reflux disease without esophagitis: Secondary | ICD-10-CM | POA: Diagnosis not present

## 2024-05-17 DIAGNOSIS — I4891 Unspecified atrial fibrillation: Secondary | ICD-10-CM | POA: Diagnosis not present

## 2024-05-17 DIAGNOSIS — Z7901 Long term (current) use of anticoagulants: Secondary | ICD-10-CM | POA: Insufficient documentation

## 2024-05-17 DIAGNOSIS — Z87891 Personal history of nicotine dependence: Secondary | ICD-10-CM | POA: Diagnosis not present

## 2024-05-17 DIAGNOSIS — I130 Hypertensive heart and chronic kidney disease with heart failure and stage 1 through stage 4 chronic kidney disease, or unspecified chronic kidney disease: Secondary | ICD-10-CM

## 2024-05-17 DIAGNOSIS — E039 Hypothyroidism, unspecified: Secondary | ICD-10-CM | POA: Insufficient documentation

## 2024-05-17 DIAGNOSIS — E785 Hyperlipidemia, unspecified: Secondary | ICD-10-CM | POA: Insufficient documentation

## 2024-05-17 DIAGNOSIS — I110 Hypertensive heart disease with heart failure: Secondary | ICD-10-CM | POA: Insufficient documentation

## 2024-05-17 DIAGNOSIS — I4819 Other persistent atrial fibrillation: Secondary | ICD-10-CM | POA: Insufficient documentation

## 2024-05-17 DIAGNOSIS — D6869 Other thrombophilia: Secondary | ICD-10-CM | POA: Diagnosis not present

## 2024-05-17 DIAGNOSIS — Z006 Encounter for examination for normal comparison and control in clinical research program: Secondary | ICD-10-CM

## 2024-05-17 DIAGNOSIS — I272 Pulmonary hypertension, unspecified: Secondary | ICD-10-CM | POA: Diagnosis not present

## 2024-05-17 DIAGNOSIS — N1832 Chronic kidney disease, stage 3b: Secondary | ICD-10-CM

## 2024-05-17 DIAGNOSIS — I5022 Chronic systolic (congestive) heart failure: Secondary | ICD-10-CM | POA: Diagnosis not present

## 2024-05-17 HISTORY — PX: CARDIOVERSION: EP1203

## 2024-05-17 SURGERY — CARDIOVERSION (CATH LAB)
Anesthesia: General

## 2024-05-17 MED ORDER — SODIUM CHLORIDE 0.9% FLUSH: 3.0000 mL | Freq: Two times a day (BID) | INTRAVENOUS | Status: DC

## 2024-05-17 MED ORDER — SODIUM CHLORIDE 0.9% FLUSH: 3.0000 mL | INTRAVENOUS | Status: DC | PRN

## 2024-05-17 MED ORDER — LIDOCAINE 2% (20 MG/ML) 5 ML SYRINGE
INTRAMUSCULAR | Status: DC | PRN
  Administered 2024-05-17: 100 mg via INTRAVENOUS

## 2024-05-17 MED ORDER — PROPOFOL 10 MG/ML IV BOLUS
INTRAVENOUS | Status: DC | PRN
Start: 2024-05-17 — End: 2024-05-17
  Administered 2024-05-17: 40 mg via INTRAVENOUS

## 2024-05-17 SURGICAL SUPPLY — 1 items: PAD DEFIB RADIO PHYSIO CONN (PAD) ×2 IMPLANT

## 2024-05-17 NOTE — Interval H&P Note (Signed)
 History and Physical Interval Note:  05/17/2024 10:25 AM  Thomas [name]  has presented today for surgery, with the diagnosis of AFIB.  The various methods of treatment have been discussed with the patient and family. After consideration of risks, benefits and other options for treatment, the patient has consented to  Procedure(s): CARDIOVERSION (N/A) as a surgical intervention.  The patient's history has been reviewed, patient examined, no change in status, stable for surgery.  I have reviewed the patient's chart and labs.  Questions were answered to the patient's satisfaction.     [employer]

## 2024-05-17 NOTE — Transfer of Care (Signed)
 Immediate Anesthesia Transfer of Care Note  Patient: Thomas [name]  Procedure(s) Performed: CARDIOVERSION  Patient Location: PACU  Anesthesia Type:General  Level of Consciousness: awake, alert , and oriented  Airway & Oxygen Therapy: Patient Spontanous Breathing and Patient connected to face mask oxygen  Post-op Assessment: Report given to RN and Post -op Vital signs reviewed and stable  Post vital signs: Reviewed and stable  Last Vitals:  Vitals Value Taken Time  BP 134/81 05/17/24 1036  Temp    Pulse 73 05/17/24 1037  Resp 17 05/17/24 1037  SpO2 97 % 05/17/24 1037  Vitals shown include unfiled device data.  Last Pain:  Vitals:   05/17/24 1038  TempSrc:   PainSc: 0-No pain         Complications: No notable events documented.

## 2024-05-17 NOTE — Anesthesia Preprocedure Evaluation (Signed)
 Anesthesia Evaluation  Patient identified by MRN, date of birth, ID band Patient awake    Reviewed: Allergy & Precautions, NPO status , Patient's Chart, lab work & pertinent test results, reviewed documented beta blocker date and time   History of Anesthesia Complications Negative for: history of anesthetic complications  Airway Mallampati: I       Dental  (+) Poor Dentition   Pulmonary neg shortness of breath, pneumonia, neg COPD, former smoker   breath sounds clear to auscultation       Cardiovascular hypertension, pulmonary hypertension+ CAD and +CHF  + dysrhythmias Atrial Fibrillation  Rhythm:Irregular Rate:Normal  1. Left ventricular ejection fraction, by estimation, is 40 to 45%. Left  ventricular ejection fraction by 2D MOD biplane is 42.3 %. The left  ventricle has mildly decreased function. The left ventricle demonstrates  global hypokinesis. The left  ventricular internal cavity size was mildly dilated. Left ventricular  diastolic parameters are consistent with Grade II diastolic dysfunction  (pseudonormalization). Elevated left ventricular end-diastolic pressure.   2. Right ventricular systolic function is normal. The right ventricular  size is normal. There is severely elevated pulmonary artery systolic  pressure. The estimated right ventricular systolic pressure is 67.6 mmHg.   3. Left atrial size was moderately dilated.   4. Right atrial size was moderately dilated.   5. The mitral valve is abnormal. Mild mitral valve regurgitation.   6. The aortic valve is tricuspid. Aortic valve regurgitation is trivial.  Aortic valve sclerosis is present, with no evidence of aortic valve  stenosis.   7. Aortic dilatation noted. There is borderline dilatation of the  ascending aorta, measuring 39 mm.   8. The inferior vena cava is normal in size with <50% respiratory  variability, suggesting right atrial pressure of 8 mmHg.       Neuro/Psych neg Seizures    GI/Hepatic ,GERD  ,,  Endo/Other  Hypothyroidism    Renal/GU Renal disease     Musculoskeletal  (+) Arthritis ,    Abdominal   Peds  Hematology   Anesthesia Other Findings   Reproductive/Obstetrics                              Anesthesia Physical Anesthesia Plan  ASA: 3  Anesthesia Plan: General   Post-op Pain Management:    Induction: Intravenous  PONV Risk Score and Plan:   Airway Management Planned: Natural Airway and Simple Face Mask  Additional Equipment:   Intra-op Plan:   Post-operative Plan:   Informed Consent: I have reviewed the patients History and Physical, chart, labs and discussed the procedure including the risks, benefits and alternatives for the proposed anesthesia with the patient or authorized representative who has indicated his/her understanding and acceptance.       Plan Discussed with: CRNA  Anesthesia Plan Comments:          Anesthesia Quick Evaluation

## 2024-05-17 NOTE — Research (Signed)
 [facility] Cardioversion Informed Consent   Subject Name: Thomas [name]  Subject met inclusion and exclusion criteria.  The informed consent form, study requirements and expectations were reviewed with the subject and questions and concerns were addressed prior to the signing of the consent form.  The subject verbalized understanding of the trial requirements.  The subject agreed to participate in the [facility] Cardioversion trial and signed the informed consent at on 06/Jun/2025.  The informed consent was obtained prior to performance of any protocol-specific procedures for the subject.  A copy of the signed informed consent was given to the subject and a copy was placed in the subject's medical record.   Polo Brisk [name]

## 2024-05-17 NOTE — Anesthesia Postprocedure Evaluation (Signed)
 Anesthesia Post Note  Patient: Barclay Leyden  Procedure(s) Performed: CARDIOVERSION     Patient location during evaluation: PACU Anesthesia Type: General Level of consciousness: awake and alert Pain management: pain level controlled Vital Signs Assessment: post-procedure vital signs reviewed and stable Respiratory status: spontaneous breathing, nonlabored ventilation, respiratory function stable and patient connected to nasal cannula oxygen Cardiovascular status: blood pressure returned to baseline and stable Postop Assessment: no apparent nausea or vomiting Anesthetic complications: no   No notable events documented.  Last Vitals:  Vitals:   05/17/24 1110 05/17/24 1115  BP: 109/66 111/66  Pulse: (!) 52 (!) 51  Resp: (!) 24 19  Temp:    SpO2: 98% 99%    Last Pain:  Vitals:   05/17/24 1115  TempSrc:   PainSc: 0-No pain                 Leslye Rast

## 2024-05-17 NOTE — CV Procedure (Signed)
    Electrical Cardioversion Procedure Note Thomas [name] [id number] September 20, 1941  Procedure: Electrical Cardioversion Indications:  Atrial Fibrillation  Time Out: Verified patient identification, verified procedure,medications/allergies/relevent history reviewed, required imaging and test results available.  Performed  Procedure Details  The patient was NPO after midnight. Anesthesia was administered at the beside  by Dr. Allean Island with propofol .  Cardioversion was performed with synchronized biphasic defibrillation via AP pads with 200 joules.  1 attempt(s) were performed.  The patient converted to normal sinus rhythm. The patient tolerated the procedure well   IMPRESSION:  Successful cardioversion of atrial fibrillation on amiodarone     Thomas [name] 05/17/2024, 10:47 AM

## 2024-05-18 ENCOUNTER — Ambulatory Visit: Payer: Self-pay | Admitting: Pulmonary Disease

## 2024-05-19 ENCOUNTER — Encounter (HOSPITAL_COMMUNITY): Payer: Self-pay | Admitting: Cardiology

## 2024-05-21 ENCOUNTER — Other Ambulatory Visit: Payer: Self-pay | Admitting: *Deleted

## 2024-05-21 DIAGNOSIS — Z79899 Other long term (current) drug therapy: Secondary | ICD-10-CM

## 2024-05-23 ENCOUNTER — Encounter: Payer: Self-pay | Admitting: Cardiovascular Disease

## 2024-05-31 LAB — BASIC METABOLIC PANEL WITH GFR
BUN/Creatinine Ratio: 11 (ref 10–24)
BUN: 20 mg/dL (ref 8–27)
CO2: 21 mmol/L (ref 20–29)
Calcium: 9.3 mg/dL (ref 8.6–10.2)
Chloride: 102 mmol/L (ref 96–106)
Creatinine, Ser: 1.78 mg/dL — ABNORMAL HIGH (ref 0.76–1.27)
Glucose: 107 mg/dL — ABNORMAL HIGH (ref 70–99)
Potassium: 3.8 mmol/L (ref 3.5–5.2)
Sodium: 140 mmol/L (ref 134–144)
eGFR: 38 mL/min/{1.73_m2} — ABNORMAL LOW (ref >59)

## 2024-06-03 ENCOUNTER — Ambulatory Visit: Payer: Self-pay | Admitting: Pulmonary Disease

## 2024-06-20 NOTE — Progress Notes (Signed)
 Electrophysiology Office Note:   Date:  06/21/2024  ID:  [name], DOB 04-30-1941, MRN [id number]  Primary Cardiologist: Aleene Passe, MD Primary Heart Failure: None Electrophysiologist: Danelle Birmingham, MD      History of Present Illness:   Thomas [name] is a 83 y.o. male with h/o PVC's, AF (new dx 01/2024), HLD, CAD seen today for routine electrophysiology followup.   Last seen in EP Clinic 05/15/24 > no specific cardiac c/o's but difficulty with prostate requiring a foley.   Since last being seen in our clinic the patient reports his largest concern currently is his pending prostate procedures > may need prostatectomy per patient. He has been in normal rhythm since his DCCV.  He has not had any awareness of AF since the cardioversion. No missed doses eliquis .     He denies chest pain, palpitations, dyspnea, PND, orthopnea, nausea, vomiting, dizziness, syncope, edema, weight gain, or early satiety.   Review of systems complete and found to be negative unless listed in HPI.   EP Information / Studies Reviewed:    EKG is ordered today. Personal review as below.  EKG Interpretation Date/Time:  Friday June 21 2024 10:07:03 EDT Ventricular Rate:  56 PR Interval:  184 QRS Duration:  150 QT Interval:  510 QTC Calculation: 492 R Axis:   -74  Text Interpretation: Sinus bradycardia with Premature supraventricular complexes Right bundle branch block Left anterior fascicular block Bifascicular block Confirmed by Aniceto Jarvis ([id number]) on 06/21/2024 10:15:11 AM   Studies:  ECHO 10/2022 > LVEF 40-45%, GII DD R/LHC 10/2022 > PAP-mean 33/14-18 mmHg. PCWP 16/14-12 mmHg & LVEDP 12 mmHg (Well Compensated), Widely patent ostial LAD overlapping stents with no obstruction of flow in the LCx.  Carotid Duplex 11/2022 > mild bilateral internal carotid plaque   Arrhythmia / AAD PVC's > symptomatic, on amiodarone  since 2022   AF > dx 01/2024 DCCV 05/17/24 > 200j x1 > conversion to NSR    Risk  Assessment/Calculations:    CHA2DS2-VASc Score = 4   This indicates a 4.8% annual risk of stroke. The patient's score is based upon: CHF History: 1 HTN History: 0 Diabetes History: 0 Stroke History: 0 Vascular Disease History: 1 Age Score: 2 Gender Score: 0             Physical Exam:   VS:  BP 120/80   Pulse (!) 56   Ht 6' (1.829 m)   Wt 187 lb 3.2 oz (84.9 kg)   SpO2 99%   BMI 25.39 kg/m    Wt Readings from Last 3 Encounters:  06/21/24 187 lb 3.2 oz (84.9 kg)  05/17/24 173 lb (78.5 kg)  05/15/24 178 lb (80.7 kg)     GEN: Well nourished, well developed in no acute distress NECK: No JVD; No carotid bruits CARDIAC: Regular rate and rhythm, no murmurs, rubs, gallops RESPIRATORY:  Clear to auscultation without rales, wheezing or rhonchi  ABDOMEN: Soft, non-tender, non-distended EXTREMITIES:  No edema; No deformity   ASSESSMENT AND PLAN:    Paroxysmal Atrial Fibrillation  New onset 01/2024 in the setting of influenza -continue amiodarone  200 mg daily (M-F)  -OAC for stroke prophylaxis  -EKG with NSR   -no AF symptoms since DCCV  -plan for repeat amiodarone  labs at next visit  Secondary Hypercoagulable State  -continue Eliquis  5mg  BID, dose reviewed and appropriate by wt / Cr   HFrEF  -GDMT per Cardiology   Prostate Issues (unable to find further confirmatory dx in EPIC) -pt has  foley currently and indicates he may have to have further surgery      Follow up with Dr. Waddell / EP APP in 6 months.  Arrange for follow up with Cardiology as well as he is overdue.  Signed, Daphne Barrack, NP-C, AGACNP-BC [facility] - Electrophysiology  06/21/2024, 10:56 AM

## 2024-06-21 ENCOUNTER — Ambulatory Visit: Attending: Pulmonary Disease | Admitting: Pulmonary Disease

## 2024-06-21 ENCOUNTER — Encounter: Payer: Self-pay | Admitting: Pulmonary Disease

## 2024-06-21 VITALS — BP 120/80 | HR 56 | Ht 72.0 in | Wt 187.2 lb

## 2024-06-21 DIAGNOSIS — I5042 Chronic combined systolic (congestive) and diastolic (congestive) heart failure: Secondary | ICD-10-CM | POA: Diagnosis not present

## 2024-06-21 DIAGNOSIS — D6869 Other thrombophilia: Secondary | ICD-10-CM

## 2024-06-21 DIAGNOSIS — I480 Paroxysmal atrial fibrillation: Secondary | ICD-10-CM

## 2024-06-21 NOTE — Patient Instructions (Signed)
 Medication Instructions:  Your physician recommends that you continue on your current medications as directed. Please refer to the Current Medication list given to you today.  *If you need a refill on your cardiac medications before your next appointment, please call your pharmacy*  Lab Work: None ordered If you have labs (blood work) drawn today and your tests are completely normal, you will receive your results only by: MyChart Message (if you have MyChart) OR A paper copy in the mail If you have any lab test that is abnormal or we need to change your treatment, we will call you to review the results.  Follow-Up: At [facility], you and your health needs are our priority.  As part of our continuing mission to provide you with exceptional heart care, our providers are all part of one team.  This team includes your primary Cardiologist (physician) and Advanced Practice Providers or APPs (Physician Assistants and Nurse Practitioners) who all work together to provide you with the care you need, when you need it.  Your next appointment:   6 month(s)  Provider:   Daphne Barrack, NP   Schedule overdue follow up with general cardiology.

## 2024-07-03 ENCOUNTER — Other Ambulatory Visit (HOSPITAL_COMMUNITY): Payer: Self-pay | Admitting: Urology

## 2024-07-03 DIAGNOSIS — R339 Retention of urine, unspecified: Secondary | ICD-10-CM

## 2024-07-04 ENCOUNTER — Telehealth: Payer: Self-pay | Admitting: Pulmonary Disease

## 2024-07-04 NOTE — Telephone Encounter (Signed)
   Pre-operative Risk Assessment    Patient Name: Thomas [name]  DOB: 1941/06/12 MRN: [id number]      Request for Surgical Clearance    Procedure:  Super Pubic Catheter Placement  Date of Surgery:  Clearance 07/09/24                                 Surgeon:  Dr. Vanice Surgeon's Group or Practice Name:  [facility]  Phone number:  [phone] Fax number:  [phone]   Type of Clearance Requested:   - Pharmacy:  Hold Apixaban  (Eliquis ) 2 days before    Type of Anesthesia:  Moderate Sedation   Additional requests/questions:    [name]   07/04/2024, 2:28 PM

## 2024-07-08 NOTE — Telephone Encounter (Signed)
 Patient with diagnosis of afib on Eliquis  for anticoagulation.    Procedure: Super Pubic Catheter Placement  Date of procedure: 07/09/24   CHA2DS2-VASc Score = 4   This indicates a 4.8% annual risk of stroke. The patient's score is based upon: CHF History: 1 HTN History: 0 Diabetes History: 0 Stroke History: 0 Vascular Disease History: 1 Age Score: 2 Gender Score: 0      CrCl 38 ml/min Platelet count 255  Patient has not had an Afib/aflutter ablation within the last 3 months or DCCV within the last 30 days  Per office protocol, patient can hold Eliquis  for 2 days prior to procedure.    Of note, patient procedure says its tomorrow. If he hasn't been holding, will need to be rescheduled.  **This guidance is not considered finalized until pre-operative APP has relayed final recommendations.**

## 2024-07-08 NOTE — Telephone Encounter (Signed)
   Name: Thomas [name]  DOB: November 27, 1941  MRN: [id number]   Primary Cardiologist: Aleene Passe, MD (Inactive)  Chart reviewed as part of pre-operative protocol coverage.   Per Pharm D, patient has not had an Afib/aflutter ablation within the last 3 months or DCCV within the last 30 days. Patient may hold Eliquis  for 2 days prior to procedure.  Patient took last dose of Eliquis  in the evening 07/06/2024.  I will route this recommendation to the requesting party via Epic fax function and remove from pre-op pool. Please call with questions.  Barnie Hila, NP 07/08/2024, 4:43 PM

## 2024-07-09 ENCOUNTER — Ambulatory Visit (HOSPITAL_COMMUNITY)
Admission: RE | Admit: 2024-07-09 | Discharge: 2024-07-09 | Disposition: A | Discharge status: Home / Self Care | Source: Ambulatory Visit | Attending: Urology | Admitting: Urology

## 2024-07-09 ENCOUNTER — Other Ambulatory Visit: Payer: Self-pay

## 2024-07-09 DIAGNOSIS — N401 Enlarged prostate with lower urinary tract symptoms: Secondary | ICD-10-CM | POA: Diagnosis not present

## 2024-07-09 DIAGNOSIS — Z7901 Long term (current) use of anticoagulants: Secondary | ICD-10-CM | POA: Diagnosis not present

## 2024-07-09 DIAGNOSIS — I2510 Atherosclerotic heart disease of native coronary artery without angina pectoris: Secondary | ICD-10-CM | POA: Insufficient documentation

## 2024-07-09 DIAGNOSIS — R339 Retention of urine, unspecified: Secondary | ICD-10-CM | POA: Diagnosis present

## 2024-07-09 DIAGNOSIS — Z87891 Personal history of nicotine dependence: Secondary | ICD-10-CM | POA: Insufficient documentation

## 2024-07-09 DIAGNOSIS — I4891 Unspecified atrial fibrillation: Secondary | ICD-10-CM | POA: Diagnosis not present

## 2024-07-09 DIAGNOSIS — N183 Chronic kidney disease, stage 3 unspecified: Secondary | ICD-10-CM | POA: Diagnosis not present

## 2024-07-09 DIAGNOSIS — I130 Hypertensive heart and chronic kidney disease with heart failure and stage 1 through stage 4 chronic kidney disease, or unspecified chronic kidney disease: Secondary | ICD-10-CM | POA: Diagnosis not present

## 2024-07-09 DIAGNOSIS — I509 Heart failure, unspecified: Secondary | ICD-10-CM | POA: Insufficient documentation

## 2024-07-09 HISTORY — PX: IR CYSTOSTOMY TUBE PLACEMENT/BLADDER ASPIRATION: IMG1097

## 2024-07-09 LAB — PROTIME-INR
INR: 1.1 (ref 0.8–1.2)
Prothrombin Time: 14.7 s (ref 11.4–15.2)

## 2024-07-09 LAB — CBC
HCT: 42.2 % (ref 39.0–52.0)
Hemoglobin: 13.9 g/dL (ref 13.0–17.0)
MCH: 35.5 pg — ABNORMAL HIGH (ref 26.0–34.0)
MCHC: 32.9 g/dL (ref 30.0–36.0)
MCV: 107.7 fL — ABNORMAL HIGH (ref 80.0–100.0)
Platelets: 290 K/uL (ref 150–400)
RBC: 3.92 MIL/uL — ABNORMAL LOW (ref 4.22–5.81)
RDW: 14.5 % (ref 11.5–15.5)
WBC: 6.8 K/uL (ref 4.0–10.5)
nRBC: 0 % (ref 0.0–0.2)

## 2024-07-09 MED ORDER — FENTANYL CITRATE (PF) 100 MCG/2ML IJ SOLN
INTRAMUSCULAR | Status: AC
Start: 2024-07-09 — End: 2024-07-09
  Filled 2024-07-09: qty 2

## 2024-07-09 MED ORDER — MIDAZOLAM HCL 2 MG/2ML IJ SOLN
INTRAMUSCULAR | Status: AC | PRN
  Administered 2024-07-09 (×2): .5 mg via INTRAVENOUS
  Administered 2024-07-09: 1 mg via INTRAVENOUS

## 2024-07-09 MED ORDER — LIDOCAINE HCL 1 % IJ SOLN
10.0000 mL | Freq: Once | INTRAMUSCULAR | Status: AC
  Administered 2024-07-09: 10 mL via INTRADERMAL

## 2024-07-09 MED ORDER — SODIUM CHLORIDE 0.9 % IV SOLN: INTRAVENOUS | Status: DC

## 2024-07-09 MED ORDER — MIDAZOLAM HCL 2 MG/2ML IJ SOLN
INTRAMUSCULAR | Status: AC
  Filled 2024-07-09: qty 2

## 2024-07-09 MED ORDER — IOHEXOL 300 MG/ML  SOLN
50.0000 mL | Freq: Once | INTRAMUSCULAR | Status: AC | PRN
  Administered 2024-07-09: 10 mL

## 2024-07-09 MED ORDER — LIDOCAINE HCL 1 % IJ SOLN
INTRAMUSCULAR | Status: AC
  Filled 2024-07-09: qty 20

## 2024-07-09 MED ORDER — FENTANYL CITRATE (PF) 100 MCG/2ML IJ SOLN
INTRAMUSCULAR | Status: AC | PRN
  Administered 2024-07-09: 50 ug via INTRAVENOUS
  Administered 2024-07-09 (×2): 25 ug via INTRAVENOUS

## 2024-07-09 NOTE — Procedures (Signed)
 Interventional Radiology Procedure Note  Procedure: US  AND FLUORO 16FR COUNCIL TIP SP TUBE    Complications: None  Estimated Blood Loss:  MIN  Findings: FULL REPORT IN PACS     EMERSON FREDERIC SPECKING, MD

## 2024-07-09 NOTE — H&P (Signed)
 Chief Complaint: Patient was seen in consultation today for urinary retention.   Referring Physician(s): Carolee Sherwood BIRCH III  Supervising Physician: Vanice Revel  Patient Status: [facility] - Out-pt  History of Present Illness: Thomas [name] is an 83 y.o. male with a medical history significant for CKD stage 3, HTN, heart failure, CAD, BPH, and atrial fibrillation on Eliquis . He was evaluated in the ED 05/12/24 for worsening urinary retention requiring foley catheterization. The patient has followed up with Urology who is recommending placement of a supra pubic catheter. The patient has had issues in the past with urinary retention, UTIs and kidney stones.   Interventional Radiology has been asked to evaluate this patient for an image-guided suprapubic catheter placement for long-term management of urinary retention.    Past Medical History:  Diagnosis Date   Arthritis    Bifascicular bundle branch block    Bigeminy 06/2019   CKD (chronic kidney disease) stage 3, GFR 30-59 ml/min (HCC) 10/26/2022   Coronary artery disease    takes Plavix  and ASA daily   GERD (gastroesophageal reflux disease)    occasional and will take Tums if needed   History of blood clots 2008   in chest   History of kidney stones late 90's   Hyperlipidemia    takes Simvastatin  daily   Hypertension    takes Maxzide  daily   Hypothyroidism    takes Synthroid  daily   Joint pain    Joint swelling    left knee   New onset atrial fibrillation (HCC) 02/04/2024   Pneumonia    at 83 years old, double    Past Surgical History:  Procedure Laterality Date   ARTERY BIOPSY Left 12/15/2021   Procedure: LEFT TEMPORAL ARTERY BIOPSY;  Surgeon: Magda Debby SAILOR, MD;  Location: [facility] OR;  Service: Vascular;  Laterality: Left;   CARDIAC CATHETERIZATION     CARDIOVERSION N/A 05/17/2024   Procedure: CARDIOVERSION;  Surgeon: Jeffrie Oneil BROCKS, MD;  Location: [facility] INVASIVE CV LAB;  Service: Cardiovascular;  Laterality: N/A;    CHOLECYSTECTOMY N/A 06/06/2018   Procedure: LAPAROSCOPIC CHOLECYSTECTOMY WITH INTRAOPERATIVE CHOLANGIOGRAM;  Surgeon: Belinda Cough, MD;  Location[facility] OR;  Service: General;  Laterality: N/A;   CORONARY ANGIOPLASTY  05/21/2007   lad stent/taxus stent   CYSTOSCOPY WITH RETROGRADE PYELOGRAM, URETEROSCOPY AND STENT PLACEMENT Right 03/29/2022   Procedure: CYSTOSCOPY WITH RETROGRADE PYELOGRAM AND STENT PLACEMENT;  Surgeon: Carolee Sherwood BIRCH DOUGLAS, MD;  Location: [facility] ORS;  Service: Urology;  Laterality: Right;   CYSTOSCOPY/URETEROSCOPY/HOLMIUM LASER/STENT PLACEMENT Right 04/18/2022   Procedure: CYSTOSCOPY\ RIGHT URETEROSCOPY/STONE BASKET EXTRACTION/STENT PLACEMENT;  Surgeon: Carolee Sherwood BIRCH DOUGLAS, MD;  Location: [facility] ORS;  Service: Urology;  Laterality: Right;  60 MINS FOR CASE   HEMORROIDECTOMY  late 1970's   HERNIA REPAIR Right 1980's   RIGHT/LEFT HEART CATH AND CORONARY ANGIOGRAPHY N/A 10/26/2022   Procedure: RIGHT/LEFT HEART CATH AND CORONARY ANGIOGRAPHY;  Surgeon: Anner Alm ORN, MD;  Location: [facility] INVASIVE CV LAB;  Service: Cardiovascular;  Laterality: N/A;   TOTAL HIP ARTHROPLASTY Right 10/28/2013   Procedure: RIGHT TOTAL HIP ARTHROPLASTY ANTERIOR APPROACH;  Surgeon: Dempsey LULLA Moan, MD;  Location: [facility] ORS;  Service: Orthopedics;  Laterality: Right;   TOTAL HIP ARTHROPLASTY Left 05/08/2015   Procedure: LEFT TOTAL HIP ARTHROPLASTY ANTERIOR APPROACH;  Surgeon: Dempsey Moan, MD;  Location: [facility] OR;  Service: Orthopedics;  Laterality: Left;    Allergies: Hydrocodone , Oxycodone , Robaxin  [methocarbamol ], and Ultram  [tramadol ]  Medications: Prior to Admission medications   Medication Sig Start Date End  Date Taking? Authorizing Provider  amiodarone  (PACERONE ) 200 MG tablet TAKE 1 TABLET BY MOUTH ONCE DAILY MONDAY THROUGH FRIDAY 02/15/24   Waddell Danelle ORN, MD  apixaban  (ELIQUIS ) 5 MG TABS tablet Take 1 tablet (5 mg total) by mouth 2 (two) times daily. 02/14/24   Aniceto Daphne CROME, NP  furosemide  (LASIX ) 20 MG tablet Take  1 tablet (20 mg total) by mouth 2 (two) times daily for 2 days, THEN 1 tablet (20 mg total) every Monday, Wednesday, and Friday. Take 1 tablet by mouth three times per week on Mondays, Wednesdays, and Fridays. 05/05/24 06/21/24  Laurence Locus, DO  ipratropium (ATROVENT) 0.06 % nasal spray Place 2 sprays into both nostrils 4 (four) times daily as needed for rhinitis.    [provider]  levothyroxine  (SYNTHROID , LEVOTHROID) 150 MCG tablet Take 150 mcg by mouth daily before breakfast. 01/04/18   [provider]  potassium chloride  SA (KLOR-CON  M) 20 MEQ tablet Take 1 tablet (20 mEq total) by mouth every Monday, Wednesday, and Friday. 05/06/24 06/21/24  Laurence Locus, DO  rosuvastatin  (CRESTOR ) 10 MG tablet Take 10 mg by mouth daily.    [provider]  sacubitril -valsartan  (ENTRESTO ) 24-26 MG TAKE 1 TABLET BY MOUTH TWICE DAILY 04/22/24   Nahser, Aleene PARAS, MD  silodosin (RAPAFLO) 4 MG CAPS capsule Take 4 mg by mouth daily. 06/15/24   [provider]     Family History  Problem Relation Age of Onset   CAD Mother    Heart failure Mother    Pneumonia Father    Heart failure Father     Social History   Socioeconomic History   Marital status: Married    Spouse name: Not on file   Number of children: Not on file   Years of education: Not on file   Highest education level: Not on file  Occupational History   Not on file  Tobacco Use   Smoking status: Former    Current packs/day: 0.00    Average packs/day: 0.3 packs/day for 52.0 years (13.0 ttl pk-yrs)    Types: Cigarettes    Start date: 15    Quit date: 2016    Years since quitting: 9.5   Smokeless tobacco: Never   Tobacco comments:    trying to quit  Vaping Use   Vaping status: Never Used  Substance and Sexual Activity   Alcohol use: Not Currently   Drug use: No   Sexual activity: Not on file  Other Topics Concern   Not on file  Social History Narrative   Not on file   Social Drivers of Health    Financial Resource Strain: Not on file  Food Insecurity: No Food Insecurity (05/05/2024)   Hunger Vital Sign    Worried About Running Out of Food in the Last Year: Never true    Ran Out of Food in the Last Year: Never true  Transportation Needs: No Transportation Needs (05/05/2024)   PRAPARE - Administrator, Civil Service (Medical): No    Lack of Transportation (Non-Medical): No  Physical Activity: Not on file  Stress: Not on file  Social Connections: Moderately Integrated (05/04/2024)   Social Connection and Isolation Panel    Frequency of Communication with Friends and Family: More than three times a week    Frequency of Social Gatherings with Friends and Family: More than three times a week    Attends Religious Services: More than 4 times per year    Active Member of  Clubs or Organizations: No    Attends Banker Meetings: Never    Marital Status: Married    Review of Systems: A 12 point ROS discussed and pertinent positives are indicated in the HPI above.  All other systems are negative.  Review of Systems  Genitourinary:  Positive for difficulty urinating and penile pain.  All other systems reviewed and are negative.   Vital Signs: BP (!) 172/73   Pulse 63   Temp 98.3 F (36.8 C)   Ht 6' (1.829 m)   Wt 183 lb (83 kg)   SpO2 98%   BMI 24.82 kg/m   Physical Exam Constitutional:      General: He is not in acute distress.    Appearance: He is not ill-appearing.  HENT:     Mouth/Throat:     Mouth: Mucous membranes are moist.     Pharynx: Oropharynx is clear.  Pulmonary:     Effort: Pulmonary effort is normal.  Abdominal:     Tenderness: There is no abdominal tenderness.  Genitourinary:    Comments: Foley catheter in place.  Skin:    General: Skin is warm and dry.  Neurological:     Mental Status: He is alert and oriented to person, place, and time.     Imaging: No results found.  Labs:  CBC: Recent Labs    02/04/24 1132  05/04/24 0659 05/05/24 0548 07/09/24 0913  WBC 6.3 9.0 7.9 6.8  HGB 12.7* 12.1* 13.4 13.9  HCT 38.5* 38.5* 42.7 42.2  PLT 209 238 255 290    COAGS: No results for input(s): INR, APTT in the last 8760 hours.  BMP: Recent Labs    02/04/24 1132 02/14/24 1024 05/04/24 0659 05/05/24 0548 05/15/24 1034 05/31/24 0840  NA 138   < > 139 138 140 140  K 3.6   < > 3.5 3.3* 4.7 3.8  CL 105   < > 108 103 98 102  CO2 23   < > 24 26 20 21   GLUCOSE 106*   < > 109* 106* 98 107*  BUN 20   < > 18 19 17 20   CALCIUM  8.8*   < > 8.7* 9.1 9.8 9.3  CREATININE 1.11   < > 1.41* 1.44* 2.39* 1.78*  GFRNONAA >60  --  50* 49*  --   --    < > = values in this interval not displayed.    LIVER FUNCTION TESTS: Recent Labs    02/14/24 1024 05/04/24 0659  BILITOT 0.8 1.0  AST 20 56*  ALT 13 50*  ALKPHOS 75 56  PROT 6.3 6.8  ALBUMIN 4.1 3.7    TUMOR MARKERS: No results for input(s): AFPTM, CEA, CA199, CHROMGRNA in the last 8760 hours.  Assessment and Plan:  Urinary retention; foley catheter dependent: Thomas [name], 83 year old male, presents today to the [facility] Interventional Radiology department for an image-guided suprapubic catheter placement.   Risks and benefits discussed with the patient including bleeding, infection, damage to adjacent structures, bowel perforation/fistula connection, and sepsis.  All of the patient's questions were answered, patient is agreeable to proceed. He has been NPO. He has held his Eliquis  appropriately.   Consent signed and in chart.  Thank you for this interesting consult.  I greatly enjoyed meeting Thomas [name] and look forward to participating in their care.  A copy of this report was sent to the requesting provider on this date.  Electronically Signed: Warren Dais, AGACNP-BC 07/09/2024, 9:29 AM  I spent a total of  30 Minutes   in face to face in clinical consultation, greater than 50% of which was counseling/coordinating  care for urinary retention.

## 2024-07-29 ENCOUNTER — Ambulatory Visit: Attending: Cardiology | Admitting: Cardiology

## 2024-07-29 ENCOUNTER — Encounter: Payer: Self-pay | Admitting: Cardiology

## 2024-07-29 VITALS — BP 114/56 | HR 60 | Ht 72.0 in | Wt 189.6 lb

## 2024-07-29 DIAGNOSIS — I2510 Atherosclerotic heart disease of native coronary artery without angina pectoris: Secondary | ICD-10-CM | POA: Diagnosis not present

## 2024-07-29 DIAGNOSIS — I480 Paroxysmal atrial fibrillation: Secondary | ICD-10-CM | POA: Diagnosis not present

## 2024-07-29 NOTE — Patient Instructions (Signed)
 Medication Instructions:  ELIQUIS  REFILL PENDING LAB RESULTS  *If you need a refill on your cardiac medications before your next appointment, please call your pharmacy*  Lab Work: CMP TSH LIPID PANEL   If you have labs (blood work) drawn today and your tests are completely normal, you will receive your results only by: MyChart Message (if you have MyChart) OR A paper copy in the mail If you have any lab test that is abnormal or we need to change your treatment, we will call you to review the results.  Testing/Procedures: ECHO  Your physician has requested that you have an echocardiogram. Echocardiography is a painless test that uses sound waves to create images of your heart. It provides your doctor with information about the size and shape of your heart and how well your heart's chambers and valves are working. This procedure takes approximately one hour. There are no restrictions for this procedure. Please do NOT wear cologne, perfume, aftershave, or lotions (deodorant is allowed). Please arrive 15 minutes prior to your appointment time.  Please note: We ask at that you not bring children with you during ultrasound (echo/ vascular) testing. Due to room size and safety concerns, children are not allowed in the ultrasound rooms during exams. Our front office staff cannot provide observation of children in our lobby area while testing is being conducted. An adult accompanying a patient to their appointment will only be allowed in the ultrasound room at the discretion of the ultrasound technician under special circumstances. We apologize for any inconvenience.   Follow-Up: At [facility], you and your health needs are our priority.  As part of our continuing mission to provide you with exceptional heart care, our providers are all part of one team.  This team includes your primary Cardiologist (physician) and Advanced Practice Providers or APPs (Physician Assistants and Nurse  Practitioners) who all work together to provide you with the care you need, when you need it.  Your next appointment:   6 month(s)  Provider:   Newman JINNY Lawrence, MD

## 2024-07-29 NOTE — Progress Notes (Addendum)
 Cardiology Office Note:  .   Date:  07/29/2024  ID:  Curly VEAR [name], DOB 09-20-1941, MRN [id number] PCP: [name], MD  [facility] Providers Cardiologist:  Newman Lawrence, MD PCP: [name], MD  Chief Complaint  Patient presents with   Coronary Artery Disease   Hyperlipidemia   Hypertension     Thomas [name] is a 83 y.o. male with hyperlipidemia, CAD, paroxysmal A-fib, PVCs  History of Present Illness  Patient previously was followed by Dr. Alveta, more recently has been seen by EP clinic.  He underwent cardioversion for A-fib in 05/2024.  He is continued on amiodarone  200 mg Monday through Friday, along with Eliquis  5 mg twice daily, as per EP recommendations.    Patient is doing well, denies any complains of chest pain, shortness breath, palpitations, leg edema.  He now has a suprapubic catheter after having indwelling Foley catheter for a long time given reported prostate issues.  He is seeing urology regularly.  He has known coronary artery disease with prior stenting that were patent on last heart catheterization in 2023.  EF was noted to be 40-45%.  He is only on Entresto  as GDMT for heart failure, along with Lasix .    Vitals:   07/29/24 0854  BP: (!) 114/56  Pulse: 60  SpO2: 98%      Review of Systems  Cardiovascular:  Negative for chest pain, dyspnea on exertion, leg swelling, palpitations and syncope.        Studies Reviewed: SABRA        EKG 06/21/2024: Sinus rhythm 56 bpm Right bundle branch block Left intrafascicular block Frequent PACs  Echocardiogram 10/2022: 1. Left ventricular ejection fraction, by estimation, is 40 to 45%. Left  ventricular ejection fraction by 2D MOD biplane is 42.3 %. The left  ventricle has mildly decreased function. The left ventricle demonstrates  global hypokinesis. The left  ventricular internal cavity size was mildly dilated. Left ventricular  diastolic parameters are consistent  with Grade II diastolic dysfunction  (pseudonormalization). Elevated left ventricular end-diastolic pressure.   2. Right ventricular systolic function is normal. The right ventricular  size is normal. There is severely elevated pulmonary artery systolic  pressure. The estimated right ventricular systolic pressure is 67.6 mmHg.   3. Left atrial size was moderately dilated.   4. Right atrial size was moderately dilated.   5. The mitral valve is abnormal. Mild mitral valve regurgitation.   6. The aortic valve is tricuspid. Aortic valve regurgitation is trivial.  Aortic valve sclerosis is present, with no evidence of aortic valve  stenosis.   7. Aortic dilatation noted. There is borderline dilatation of the  ascending aorta, measuring 39 mm.   8. The inferior vena cava is normal in size with <50% respiratory  variability, suggesting right atrial pressure of 8 mmHg.   Comparison(s): Changes from prior study are noted. 07/02/2019: LVEF 60-65%.   Left and right heart catheterization 10/2022:  Previously placed Ost LAD to Mid LAD stent of unknown type is  widely patent-> Ost Cx lesion is 20% stenosed with no flow limitations .   Otherwise minimal-nonobstructive CAD.   --------------------------------------------------------   Right Heart: PAP-mean 33/14-18 mmHg. PCWP 16/14-12 mmHg & LVEDP 12 mmHg (Well Compensated)   Right Heart: Somewhat conflicting cardiac Output-Index: Fick 6.34-3.12; Thermal 4.17-2.05 (consider possibility of valvular disease)   POST-PROCEDURE DIAGNOSIS:   Angiographically stable coronary artery disease: Widely patent ostial LAD overlapping stents with no obstruction of flow in the LCx.  Downward takeoff RCA (used AR-1 catheter) widely patent with PDA and PL 1 Right Heart Cath Pressures essentially normal: .    RAP 4 mmHg 3, RVP 39/1-1 mmHg.   PAP-mean 33/14-18 mmHg.  PCWP 16/14-12 mmHg;  LV P- EDP 157/5-12 mmHg; AOP-MAP 151/57-88 mmHg Ao sat 97% PA sat 70%. Somewhat  conflicting cardiac Output-Index: Fick 6.34-3.12; Thermal 4.17-2.05 (consider possibility of valvular disease)   PLAN OF CARE:  Stable transfer back to [facility] Defer titration of heart failure medications to Dr. Santo    Labs 5-06/2024: HbA1C 5.1% Hb 13.9 Cr 1.78  10/2022: Chol 130, TG 77, HDL 35, LDL 80  Risk Assessment/Calculations:    CHA2DS2-VASc Score = 4   This indicates a 4.8% annual risk of stroke. The patient's score is based upon: CHF History: 1 HTN History: 0 Diabetes History: 0 Stroke History: 0 Vascular Disease History: 1 Age Score: 2 Gender Score: 0    Physical Exam Vitals and nursing note reviewed.  Constitutional:      General: He is not in acute distress. Neck:     Vascular: No JVD.  Cardiovascular:     Rate and Rhythm: Normal rate and regular rhythm.     Heart sounds: Normal heart sounds. No murmur heard. Pulmonary:     Effort: Pulmonary effort is normal.     Breath sounds: Normal breath sounds. No wheezing or rales.  Genitourinary:    Comments: Suprapubic catheter in place Musculoskeletal:     Right lower leg: No edema.     Left lower leg: No edema.      VISIT DIAGNOSES:   ICD-10-CM   1. PAF (paroxysmal atrial fibrillation) (HCC)  I48.0 Comprehensive metabolic panel with GFR    TSH    ECHOCARDIOGRAM COMPLETE    2. Coronary artery disease involving native coronary artery of native heart without angina pectoris  I25.10 Lipid panel       ORVILE [name] is a 83 y.o. male with hyperlipidemia, CAD, paroxysmal A-fib, PVCs Assessment & Plan  Paroxysmal A-fib: Maintaining sinus rhythm since cardioversion in 05/2024. Currently on amiodarone  200 mg daily Monday through Friday.  Check CMP, TSH. With age >80 years, and creatinine >1.5, his appropriate Eliquis  dose would be 2.5 mg twice daily, not 5 mg twice daily.  That said, he is has had some issues related to prostate and possibly obstructive uropathy.  I will check  creatinine again today to make sure that it does not come down below 1.5 before confirming the Eliquis  dose.  CAD: Prior PCI, patent stents in 2023.  No anginal symptoms. Check lipid panel today. Given ongoing use of Eliquis , discontinue aspirin . Patient has had mildly reduced EF in the past, but clinically appears euvolemic today without any symptoms of heart failure.  He is only on low-dose Entresto  as GDMT.  Will check echocardiogram today.  If EF is normal, reasonable to continue Entresto  at the same dose.  If EF remains low, could consider adding additional GDMT in future.  However, this could be limited given his low normal blood pressure, and GU issues which could preclude the use of SGLT2I.   F/u in 6 months  Signed, Newman JINNY Lawrence, MD

## 2024-07-30 LAB — TSH: TSH: 4.34 u[IU]/mL (ref 0.450–4.500)

## 2024-07-30 LAB — COMPREHENSIVE METABOLIC PANEL WITH GFR
ALT: 10 IU/L (ref 0–44)
AST: 18 IU/L (ref 0–40)
Albumin: 3.9 g/dL (ref 3.7–4.7)
Alkaline Phosphatase: 65 IU/L (ref 44–121)
BUN/Creatinine Ratio: 14 (ref 10–24)
BUN: 27 mg/dL (ref 8–27)
Bilirubin Total: 0.6 mg/dL (ref 0.0–1.2)
CO2: 22 mmol/L (ref 20–29)
Calcium: 9 mg/dL (ref 8.6–10.2)
Chloride: 103 mmol/L (ref 96–106)
Creatinine, Ser: 1.89 mg/dL — ABNORMAL HIGH (ref 0.76–1.27)
Globulin, Total: 2.5 g/dL (ref 1.5–4.5)
Glucose: 92 mg/dL (ref 70–99)
Potassium: 3.7 mmol/L (ref 3.5–5.2)
Sodium: 141 mmol/L (ref 134–144)
Total Protein: 6.4 g/dL (ref 6.0–8.5)
eGFR: 35 mL/min/1.73 — ABNORMAL LOW (ref >59)

## 2024-07-30 LAB — LIPID PANEL
Chol/HDL Ratio: 2.6 ratio (ref 0.0–5.0)
Cholesterol, Total: 145 mg/dL (ref 100–199)
HDL: 55 mg/dL (ref >39)
LDL Chol Calc (NIH): 78 mg/dL (ref 0–99)
Triglycerides: 57 mg/dL (ref 0–149)
VLDL Cholesterol Cal: 12 mg/dL (ref 5–40)

## 2024-07-31 ENCOUNTER — Ambulatory Visit: Payer: Self-pay

## 2024-08-02 NOTE — Progress Notes (Signed)
 Renal function fairly stable.  Cholesterol only slightly higher than goal.  If tolerating Crestor  10 mg daily, recommend increasing to 20 mg daily.  Lipid panel can be checked with PCP in the future.  Thanks MJP

## 2024-08-06 MED ORDER — ROSUVASTATIN CALCIUM 20 MG PO TABS: 20.0000 mg | ORAL_TABLET | Freq: Every day | ORAL | 3 refills | Status: AC

## 2024-08-07 ENCOUNTER — Other Ambulatory Visit: Payer: Self-pay | Admitting: *Deleted

## 2024-08-07 MED ORDER — APIXABAN 2.5 MG PO TABS: 2.5000 mg | ORAL_TABLET | Freq: Two times a day (BID) | ORAL | 3 refills | Status: DC

## 2024-08-07 NOTE — Progress Notes (Signed)
 Spoke with pt and made him aware of message below to which he states his understanding. Rx sent to pharmacy of pts choice.

## 2024-08-07 NOTE — Progress Notes (Signed)
 Creatinine >1.5, age >80 years. Recommend Eliquis  2.5 mg twice daily. Thank you for checking.  MJP

## 2024-08-08 NOTE — Progress Notes (Signed)
 No. Okay to see me in 6 months.  Thanks MJP

## 2024-09-04 ENCOUNTER — Ambulatory Visit (HOSPITAL_COMMUNITY)
Admission: RE | Admit: 2024-09-04 | Discharge: 2024-09-04 | Disposition: A | Discharge status: Home / Self Care | Source: Ambulatory Visit | Attending: Cardiovascular Disease | Admitting: Cardiovascular Disease

## 2024-09-04 DIAGNOSIS — I480 Paroxysmal atrial fibrillation: Secondary | ICD-10-CM | POA: Insufficient documentation

## 2024-09-04 LAB — ECHOCARDIOGRAM COMPLETE
Area-P 1/2: 3.42 cm2
MV M vel: 4.94 m/s
MV Peak grad: 97.6 mmHg
Radius: 0.5 cm
S' Lateral: 3.7 cm

## 2024-10-28 ENCOUNTER — Emergency Department (HOSPITAL_COMMUNITY)

## 2024-10-28 ENCOUNTER — Emergency Department (HOSPITAL_COMMUNITY)
Admission: EM | Admit: 2024-10-28 | Discharge: 2024-10-28 | Disposition: A | Discharge status: Home / Self Care | Attending: Emergency Medicine | Admitting: Emergency Medicine

## 2024-10-28 ENCOUNTER — Encounter (HOSPITAL_COMMUNITY): Payer: Self-pay

## 2024-10-28 DIAGNOSIS — I498 Other specified cardiac arrhythmias: Secondary | ICD-10-CM | POA: Diagnosis not present

## 2024-10-28 DIAGNOSIS — R0600 Dyspnea, unspecified: Secondary | ICD-10-CM

## 2024-10-28 DIAGNOSIS — R059 Cough, unspecified: Secondary | ICD-10-CM | POA: Diagnosis not present

## 2024-10-28 DIAGNOSIS — R0602 Shortness of breath: Secondary | ICD-10-CM | POA: Diagnosis present

## 2024-10-28 LAB — COMPREHENSIVE METABOLIC PANEL WITH GFR
ALT: 19 U/L (ref 0–44)
AST: 25 U/L (ref 15–41)
Albumin: 3.6 g/dL (ref 3.5–5.0)
Alkaline Phosphatase: 50 U/L (ref 38–126)
Anion gap: 12 (ref 5–15)
BUN: 17 mg/dL (ref 8–23)
CO2: 22 mmol/L (ref 22–32)
Calcium: 8.6 mg/dL — ABNORMAL LOW (ref 8.9–10.3)
Chloride: 108 mmol/L (ref 98–111)
Creatinine, Ser: 1.58 mg/dL — ABNORMAL HIGH (ref 0.61–1.24)
GFR, Estimated: 43 mL/min — ABNORMAL LOW (ref >60)
Glucose, Bld: 94 mg/dL (ref 70–99)
Potassium: 3.2 mmol/L — ABNORMAL LOW (ref 3.5–5.1)
Sodium: 142 mmol/L (ref 135–145)
Total Bilirubin: 1.4 mg/dL — ABNORMAL HIGH (ref 0.0–1.2)
Total Protein: 6.5 g/dL (ref 6.5–8.1)

## 2024-10-28 LAB — BLOOD GAS, VENOUS
Acid-base deficit: 0.8 mmol/L (ref 0.0–2.0)
Bicarbonate: 24.9 mmol/L (ref 20.0–28.0)
Drawn by: 9022
O2 Saturation: 96.9 %
Patient temperature: 36.3
pCO2, Ven: 43 mmHg — ABNORMAL LOW (ref 44–60)
pH, Ven: 7.37 (ref 7.25–7.43)
pO2, Ven: 67 mmHg — ABNORMAL HIGH (ref 32–45)

## 2024-10-28 LAB — RESP PANEL BY RT-PCR (RSV, FLU A&B, COVID)  RVPGX2
Influenza A by PCR: NEGATIVE
Influenza B by PCR: NEGATIVE
Resp Syncytial Virus by PCR: NEGATIVE
SARS Coronavirus 2 by RT PCR: NEGATIVE

## 2024-10-28 LAB — CBC
HCT: 40 % (ref 39.0–52.0)
Hemoglobin: 13.1 g/dL (ref 13.0–17.0)
MCH: 35.5 pg — ABNORMAL HIGH (ref 26.0–34.0)
MCHC: 32.8 g/dL (ref 30.0–36.0)
MCV: 108.4 fL — ABNORMAL HIGH (ref 80.0–100.0)
Platelets: 211 K/uL (ref 150–400)
RBC: 3.69 MIL/uL — ABNORMAL LOW (ref 4.22–5.81)
RDW: 14.3 % (ref 11.5–15.5)
WBC: 4.8 K/uL (ref 4.0–10.5)
nRBC: 0 % (ref 0.0–0.2)

## 2024-10-28 LAB — D-DIMER, QUANTITATIVE: D-Dimer, Quant: 2.66 ug{FEU}/mL — ABNORMAL HIGH (ref 0.00–0.50)

## 2024-10-28 MED ORDER — LACTATED RINGERS IV BOLUS
500.0000 mL | Freq: Once | INTRAVENOUS | Status: AC
  Administered 2024-10-28: 500 mL via INTRAVENOUS

## 2024-10-28 MED ORDER — IOHEXOL 350 MG/ML SOLN
75.0000 mL | Freq: Once | INTRAVENOUS | Status: AC | PRN
  Administered 2024-10-28: 75 mL via INTRAVENOUS

## 2024-10-28 NOTE — Discharge Instructions (Addendum)
 You were seen and evaluated today for shortness of breath.  Initial, your blood pressure was elevated which may have contributed to this.  Also you were in a rhythm called bigeminy.  This is generally a stable rhythm but may have contributed to your shortness of breath. Please take an additional dose of your Lasix  for the next 3 days. Please call and follow-up with your heart doctor Return to the emergency department if worse at any time Please follow-up with your primary care doctor Return if you are having any new or worsening symptoms

## 2024-10-28 NOTE — ED Triage Notes (Signed)
 Pt BIB [facility] for resp distress. Pt reported that last night he was [facility], woke up this morning SHOB worse. Per fire O2 was low 90s upon their arrival. Pt given 1 nitroglycerin  and placed on CPAP PTA.   190/110 CBG 106 HR 62 100% on CPAP

## 2024-10-28 NOTE — Progress Notes (Signed)
 RT removed patient from Bipap to room air per MD request. Patient tolerating well, no increased WOB at this time. RN notified.

## 2024-10-28 NOTE — ED Provider Notes (Addendum)
 [place] EMERGENCY DEPARTMENT AT [facility] Provider Note   CSN: [id number] Arrival date & time: 10/28/24  0848     Patient presents with: Respiratory Distress   Thomas [name] is a 83 y.o. male.   HPI 83 yo male presents today with onset of shortness of breath that began last night.  It was gradual worsened through the night.  He has had some associated cough but no fever or known peripheral swelling patient reports he had similar symptoms 2 years ago when he had fluid on his lungs.  He states that they gave him a pill at that time.  He denies any chest pain or other pain. EMS reports that he was in respiratory distress with sats in the low 90s on their arrival.  They placed him on CPAP and sats increased to 100%.  Initial blood pressure was systolically 160 and he received 1 dose of nitroglycerin  prehospital    Prior to Admission medications   Medication Sig Start Date End Date Taking? Authorizing Provider  amiodarone  (PACERONE ) 200 MG tablet TAKE 1 TABLET BY MOUTH ONCE DAILY MONDAY THROUGH FRIDAY 02/15/24   Waddell Danelle ORN, MD  apixaban  (ELIQUIS ) 2.5 MG TABS tablet Take 1 tablet (2.5 mg total) by mouth 2 (two) times daily. 08/07/24   Aniceto Daphne CROME, NP  furosemide  (LASIX ) 20 MG tablet Take 1 tablet (20 mg total) by mouth 2 (two) times daily for 2 days, THEN 1 tablet (20 mg total) every Monday, Wednesday, and Friday. Take 1 tablet by mouth three times per week on Mondays, Wednesdays, and Fridays. 05/05/24 07/29/24  Laurence Locus, DO  ipratropium (ATROVENT) 0.06 % nasal spray Place 2 sprays into both nostrils 4 (four) times daily as needed for rhinitis.    [provider]  levothyroxine  (SYNTHROID , LEVOTHROID) 150 MCG tablet Take 150 mcg by mouth daily before breakfast. 01/04/18   [provider]  potassium chloride  SA (KLOR-CON  M) 20 MEQ tablet Take 1 tablet (20 mEq total) by mouth every Monday, Wednesday, and Friday. 05/06/24 07/29/24  Laurence Locus, DO   rosuvastatin  (CRESTOR ) 20 MG tablet Take 1 tablet (20 mg total) by mouth daily. 08/06/24   Patwardhan, Newman PARAS, MD  sacubitril -valsartan  (ENTRESTO ) 24-26 MG TAKE 1 TABLET BY MOUTH TWICE DAILY 04/22/24   Nahser, Aleene PARAS, MD  silodosin (RAPAFLO) 4 MG CAPS capsule Take 4 mg by mouth daily. 06/15/24   [provider]    Allergies: Hydrocodone , Oxycodone , Robaxin  [methocarbamol ], and Ultram  [tramadol ]    Review of Systems  Updated Vital Signs BP (!) 150/75   Pulse (!) 52   Temp 98.1 F (36.7 C) (Oral)   Resp 11   SpO2 99%   Physical Exam Vitals reviewed.  Constitutional:      General: He is in acute distress.     Appearance: Normal appearance. He is not ill-appearing.  HENT:     Head: Normocephalic.     Right Ear: External ear normal.     Left Ear: External ear normal.     Nose: Nose normal.  Eyes:     Pupils: Pupils are equal, round, and reactive to light.  Cardiovascular:     Rate and Rhythm: Normal rate and regular rhythm.     Pulses: Normal pulses.  Pulmonary:     Comments: Decreased breath sounds bilateral lower lobes Abdominal:     Palpations: Abdomen is soft.  Musculoskeletal:        General: Normal range of motion.  Cervical back: Normal range of motion.     Comments: Trace edema bilateral lower legs  Skin:    General: Skin is warm and dry.     Capillary Refill: Capillary refill takes less than 2 seconds.  Neurological:     General: No focal deficit present.     Mental Status: He is alert.  Psychiatric:        Mood and Affect: Mood normal.     (all labs ordered are listed, but only abnormal results are displayed) Labs Reviewed  COMPREHENSIVE METABOLIC PANEL WITH GFR - Abnormal; Notable for the following components:      Result Value   Potassium 3.2 (*)    Creatinine, Ser 1.58 (*)    Calcium  8.6 (*)    Total Bilirubin 1.4 (*)    GFR, Estimated 43 (*)    All other components within normal limits  CBC - Abnormal; Notable for the following  components:   RBC 3.69 (*)    MCV 108.4 (*)    MCH 35.5 (*)    All other components within normal limits  BLOOD GAS, VENOUS - Abnormal; Notable for the following components:   pCO2, Ven 43 (*)    pO2, Ven 67 (*)    All other components within normal limits  D-DIMER, QUANTITATIVE - Abnormal; Notable for the following components:   D-Dimer, Quant 2.66 (*)    All other components within normal limits  RESP PANEL BY RT-PCR (RSV, FLU A&B, COVID)  [id number]    EKG: EKG Interpretation Date/Time:  Monday October 28 2024 13:49:28 EST Ventricular Rate:  60 PR Interval:  224 QRS Duration:  171 QT Interval:  518 QTC Calculation: 518 R Axis:   -77  Text Interpretation: Sinus or ectopic atrial rhythm Prolonged PR interval RBBB and LAFB Confirmed by Levander Houston [phone]) on 10/28/2024 1:57:13 PM  Radiology: CT Angio Chest PE W and/or Wo Contrast Result Date: 10/28/2024 CLINICAL DATA:  Pulmonary embolism suspected, high probability. EXAM: CT ANGIOGRAPHY CHEST WITH CONTRAST TECHNIQUE: Multidetector CT imaging of the chest was performed using the standard protocol during bolus administration of intravenous contrast. Multiplanar CT image reconstructions and MIPs were obtained to evaluate the vascular anatomy. RADIATION DOSE REDUCTION: This exam was performed according to the departmental dose-optimization program which includes automated exposure control, adjustment of the mA and/or kV according to patient size and/or use of iterative reconstruction technique. CONTRAST:  75mL OMNIPAQUE  IOHEXOL  350 MG/ML SOLN COMPARISON:  02/02/2023. FINDINGS: Cardiovascular: Negative for pulmonary embolus. Atherosclerotic calcification of the aorta, aortic valve and coronary arteries. Enlarged pulmonic trunk and heart. No pericardial effusion. Mediastinum/Nodes: No pathologically enlarged mediastinal, hilar or axillary lymph nodes. Esophagus is grossly unremarkable. Lungs/Pleura: Image quality is degraded by expiratory  phase imaging and respiratory motion. Dependent atelectasis bilaterally. Centrilobular and paraseptal emphysema. Trace bilateral pleural effusions. Airway is otherwise unremarkable. Upper Abdomen: Cholecystectomy. Tiny right renal stones. Visualized portions of the liver, adrenal glands, kidneys, spleen, pancreas, stomach and bowel are otherwise grossly unremarkable. No upper abdominal adenopathy. Musculoskeletal: Degenerative changes in the spine. Review of the MIP images confirms the above findings. IMPRESSION: 1. Negative for pulmonary embolus. 2. Trace bilateral pleural effusions. 3. Tiny right renal stones. 4. Aortic atherosclerosis (ICD10-I70.0). Coronary artery calcification. 5. Enlarged pulmonic trunk, indicative of pulmonary arterial hypertension. 6.  Emphysema (ICD10-J43.9). Electronically Signed   By: Newell Eke M.D.   On: 10/28/2024 13:14   DG Chest Port 1 View Result Date: 10/28/2024 EXAM: 1 VIEW(S) XRAY OF THE CHEST 10/28/2024 09:31:00  AM COMPARISON: 05/04/2024 CLINICAL HISTORY: dyspnea FINDINGS: LUNGS AND PLEURA: No focal pulmonary opacity. No pleural effusion. No pneumothorax. HEART AND MEDIASTINUM: Cardiomegaly, stable. Calcified aorta. BONES AND SOFT TISSUES: No acute osseous abnormality. IMPRESSION: 1. No acute cardiopulmonary process. 2. Stable cardiomegaly. Electronically signed by: Donnice Mania MD 10/28/2024 10:18 AM EST RP Workstation: [id number]     Procedures   Medications Ordered in the ED  lactated ringers  bolus 500 mL (0 mLs Intravenous Stopped 10/28/24 1242)  iohexol  (OMNIPAQUE ) 350 MG/ML injection 75 mL (75 mLs Intravenous Contrast Given 10/28/24 1209)    Clinical Course as of 10/28/24 1358  Mon Oct 28, 2024  9045 Chest x-Cipriana Biller reviewed and interpreted with no obvious acute abnormality noted on my interpretation awaiting radiologist interpretation [DR]  1103 Patient has BiPAP discontinued and is resting comfortably on room air with oxygen saturations 97 to 98% [DR]   1130 D-dimer elevated will add cta [DR]  1339 CT chest negative for pulmonary embolism significant for trace bilateral pleural effusions with trace right renal stones, aortic atherosclerosis with coronary artery calcification, enlarged pulmonary trunk indicative of pulmonary arterial hypertension and emphysema [DR]    Clinical Course User Index [DR] Levander Houston, MD                                 Medical Decision Making Amount and/or Complexity of Data Reviewed Labs: ordered. Radiology: ordered.  Risk Prescription drug management.  83 year old male presents today complaining of onset of shortness of breath that began last night.  He feels that this is similar to his prior episode of when he had fluid in his lungs.  Review of records reveal that patient was admitted with bilateral pleural effusions in 2023 and treated with Lasix . Differential diagnosis includes was not limited to CHF, pulmonary edema, pleural effusions, infection including pneumonia, PE, cardiac etiologies, COPD, asthma, anemia, arrhythmia Patient was evaluated with physical exam with clear lungs no evidence of wheezing Patient was evaluated with labs including CBC that showed normal hemoglobin D-dimer elevated at 2.66 increasing likelihood of PE COVID, flu, RSV negative CTA was obtained that shows no evidence of pulmonary embolism but trace pleural effusions, and enlarged pulmonary trunk concerning for pulmonary arterial hypertension and emphysema No definitive cause found here in ED of patient's etiology of dyspnea Patient with known A-fib and was in bigeminy but now is in Patient ambulatory with oxygen saturations 97 to 99% Discussed with daughter Sari Patient appears stable for discharge to home Repeat EKG with regular rhythm and no evidence of acute ischemia Suspect symptoms may have been due to bigeminy, or elevated bp which has normalized without intervention.  plan increase Lasix  and follow-up with cardiology  and primary care     Final diagnoses:  Dyspnea, unspecified type  Ventricular bigeminy    ED Discharge Orders     None          Levander Houston, MD 10/28/24 1358    Levander Houston, MD 10/28/24 1446

## 2024-10-28 NOTE — ED Notes (Signed)
 Pt o2 remained at 97-99% while ambulating.

## 2024-10-28 NOTE — ED Notes (Signed)
 CCMD called. PT placed on monitor.

## 2024-11-23 ENCOUNTER — Observation Stay (HOSPITAL_COMMUNITY)
Admission: EM | Admit: 2024-11-23 | Discharge: 2024-11-24 | Disposition: A | Discharge status: Home / Self Care | Attending: Internal Medicine | Admitting: Internal Medicine

## 2024-11-23 ENCOUNTER — Encounter (HOSPITAL_COMMUNITY): Payer: Self-pay | Admitting: Family Medicine

## 2024-11-23 ENCOUNTER — Other Ambulatory Visit (HOSPITAL_COMMUNITY): Payer: Self-pay

## 2024-11-23 ENCOUNTER — Emergency Department (HOSPITAL_COMMUNITY)

## 2024-11-23 ENCOUNTER — Other Ambulatory Visit: Payer: Self-pay

## 2024-11-23 DIAGNOSIS — N400 Enlarged prostate without lower urinary tract symptoms: Secondary | ICD-10-CM | POA: Diagnosis not present

## 2024-11-23 DIAGNOSIS — Z79890 Hormone replacement therapy (postmenopausal): Secondary | ICD-10-CM | POA: Insufficient documentation

## 2024-11-23 DIAGNOSIS — Z955 Presence of coronary angioplasty implant and graft: Secondary | ICD-10-CM | POA: Insufficient documentation

## 2024-11-23 DIAGNOSIS — E039 Hypothyroidism, unspecified: Secondary | ICD-10-CM | POA: Diagnosis not present

## 2024-11-23 DIAGNOSIS — I2510 Atherosclerotic heart disease of native coronary artery without angina pectoris: Secondary | ICD-10-CM | POA: Diagnosis not present

## 2024-11-23 DIAGNOSIS — R600 Localized edema: Secondary | ICD-10-CM | POA: Diagnosis not present

## 2024-11-23 DIAGNOSIS — R0602 Shortness of breath: Secondary | ICD-10-CM | POA: Diagnosis present

## 2024-11-23 DIAGNOSIS — Z992 Dependence on renal dialysis: Secondary | ICD-10-CM | POA: Insufficient documentation

## 2024-11-23 DIAGNOSIS — I509 Heart failure, unspecified: Secondary | ICD-10-CM

## 2024-11-23 DIAGNOSIS — Z960 Presence of urogenital implants: Secondary | ICD-10-CM | POA: Insufficient documentation

## 2024-11-23 DIAGNOSIS — Z1152 Encounter for screening for COVID-19: Secondary | ICD-10-CM | POA: Diagnosis not present

## 2024-11-23 DIAGNOSIS — I130 Hypertensive heart and chronic kidney disease with heart failure and stage 1 through stage 4 chronic kidney disease, or unspecified chronic kidney disease: Principal | ICD-10-CM | POA: Insufficient documentation

## 2024-11-23 DIAGNOSIS — I5043 Acute on chronic combined systolic (congestive) and diastolic (congestive) heart failure: Secondary | ICD-10-CM | POA: Insufficient documentation

## 2024-11-23 DIAGNOSIS — I10 Essential (primary) hypertension: Secondary | ICD-10-CM | POA: Diagnosis present

## 2024-11-23 DIAGNOSIS — Z7901 Long term (current) use of anticoagulants: Secondary | ICD-10-CM | POA: Diagnosis not present

## 2024-11-23 DIAGNOSIS — Z96643 Presence of artificial hip joint, bilateral: Secondary | ICD-10-CM | POA: Diagnosis not present

## 2024-11-23 DIAGNOSIS — I480 Paroxysmal atrial fibrillation: Secondary | ICD-10-CM | POA: Diagnosis present

## 2024-11-23 DIAGNOSIS — R079 Chest pain, unspecified: Secondary | ICD-10-CM | POA: Diagnosis present

## 2024-11-23 DIAGNOSIS — E785 Hyperlipidemia, unspecified: Secondary | ICD-10-CM | POA: Diagnosis present

## 2024-11-23 DIAGNOSIS — N3001 Acute cystitis with hematuria: Secondary | ICD-10-CM

## 2024-11-23 DIAGNOSIS — N390 Urinary tract infection, site not specified: Secondary | ICD-10-CM | POA: Diagnosis not present

## 2024-11-23 DIAGNOSIS — I5042 Chronic combined systolic (congestive) and diastolic (congestive) heart failure: Secondary | ICD-10-CM

## 2024-11-23 DIAGNOSIS — N1832 Chronic kidney disease, stage 3b: Secondary | ICD-10-CM | POA: Diagnosis present

## 2024-11-23 DIAGNOSIS — Z978 Presence of other specified devices: Secondary | ICD-10-CM

## 2024-11-23 DIAGNOSIS — R0789 Other chest pain: Secondary | ICD-10-CM | POA: Diagnosis present

## 2024-11-23 DIAGNOSIS — E782 Mixed hyperlipidemia: Secondary | ICD-10-CM

## 2024-11-23 DIAGNOSIS — Z79899 Other long term (current) drug therapy: Secondary | ICD-10-CM | POA: Diagnosis not present

## 2024-11-23 DIAGNOSIS — Z87891 Personal history of nicotine dependence: Secondary | ICD-10-CM | POA: Insufficient documentation

## 2024-11-23 DIAGNOSIS — R0989 Other specified symptoms and signs involving the circulatory and respiratory systems: Secondary | ICD-10-CM

## 2024-11-23 LAB — URINALYSIS, ROUTINE W REFLEX MICROSCOPIC
Bilirubin Urine: NEGATIVE
Glucose, UA: NEGATIVE mg/dL
Ketones, ur: 5 mg/dL — AB
Nitrite: POSITIVE — AB
Protein, ur: 100 mg/dL — AB
Specific Gravity, Urine: 1.023 (ref 1.005–1.030)
WBC, UA: >50 WBC/hpf (ref 0–5)
pH: 7 (ref 5.0–8.0)

## 2024-11-23 LAB — CBC WITH DIFFERENTIAL/PLATELET
Abs Immature Granulocytes: 0.05 K/uL (ref 0.00–0.07)
Basophils Absolute: 0.1 K/uL (ref 0.0–0.1)
Basophils Relative: 1 %
Eosinophils Absolute: 0 K/uL (ref 0.0–0.5)
Eosinophils Relative: 0 %
HCT: 40.2 % (ref 39.0–52.0)
Hemoglobin: 13.3 g/dL (ref 13.0–17.0)
Immature Granulocytes: 1 %
Lymphocytes Relative: 9 %
Lymphs Abs: 0.9 K/uL (ref 0.7–4.0)
MCH: 35.4 pg — ABNORMAL HIGH (ref 26.0–34.0)
MCHC: 33.1 g/dL (ref 30.0–36.0)
MCV: 106.9 fL — ABNORMAL HIGH (ref 80.0–100.0)
Monocytes Absolute: 0.7 K/uL (ref 0.1–1.0)
Monocytes Relative: 8 %
Neutro Abs: 7.9 K/uL — ABNORMAL HIGH (ref 1.7–7.7)
Neutrophils Relative %: 81 %
Platelets: 238 K/uL (ref 150–400)
RBC: 3.76 MIL/uL — ABNORMAL LOW (ref 4.22–5.81)
RDW: 14.2 % (ref 11.5–15.5)
WBC: 9.7 K/uL (ref 4.0–10.5)
nRBC: 0.2 % (ref 0.0–0.2)

## 2024-11-23 LAB — PROTIME-INR
INR: 1.3 — ABNORMAL HIGH (ref 0.8–1.2)
Prothrombin Time: 17.2 s — ABNORMAL HIGH (ref 11.4–15.2)

## 2024-11-23 LAB — BASIC METABOLIC PANEL WITH GFR
Anion gap: 10 (ref 5–15)
BUN: 22 mg/dL (ref 8–23)
CO2: 23 mmol/L (ref 22–32)
Calcium: 8 mg/dL — ABNORMAL LOW (ref 8.9–10.3)
Chloride: 107 mmol/L (ref 98–111)
Creatinine, Ser: 1.71 mg/dL — ABNORMAL HIGH (ref 0.61–1.24)
GFR, Estimated: 39 mL/min — ABNORMAL LOW (ref >60)
Glucose, Bld: 114 mg/dL — ABNORMAL HIGH (ref 70–99)
Potassium: 3.1 mmol/L — ABNORMAL LOW (ref 3.5–5.1)
Sodium: 140 mmol/L (ref 135–145)

## 2024-11-23 LAB — BRAIN NATRIURETIC PEPTIDE: B Natriuretic Peptide: 526.5 pg/mL — ABNORMAL HIGH (ref 0.0–100.0)

## 2024-11-23 LAB — RESP PANEL BY RT-PCR (RSV, FLU A&B, COVID)  RVPGX2
Influenza A by PCR: NEGATIVE
Influenza B by PCR: NEGATIVE
Resp Syncytial Virus by PCR: NEGATIVE
SARS Coronavirus 2 by RT PCR: NEGATIVE

## 2024-11-23 LAB — MAGNESIUM: Magnesium: 1.3 mg/dL — ABNORMAL LOW (ref 1.7–2.4)

## 2024-11-23 LAB — TROPONIN I (HIGH SENSITIVITY)
Troponin I (High Sensitivity): 15 ng/L (ref <18)
Troponin I (High Sensitivity): 16 ng/L (ref <18)

## 2024-11-23 LAB — PHOSPHORUS: Phosphorus: 2.3 mg/dL — ABNORMAL LOW (ref 2.5–4.6)

## 2024-11-23 MED ORDER — ACETAMINOPHEN 325 MG PO TABS: 650.0000 mg | ORAL_TABLET | Freq: Four times a day (QID) | ORAL | Status: DC | PRN

## 2024-11-23 MED ORDER — ONDANSETRON HCL 4 MG PO TABS: 4.0000 mg | ORAL_TABLET | Freq: Four times a day (QID) | ORAL | Status: DC | PRN

## 2024-11-23 MED ORDER — OXYCODONE HCL 5 MG PO TABS: 5.0000 mg | ORAL_TABLET | ORAL | Status: DC | PRN

## 2024-11-23 MED ORDER — FLEET ENEMA RE ENEM: 1.0000 | ENEMA | Freq: Once | RECTAL | Status: DC | PRN

## 2024-11-23 MED ORDER — SACUBITRIL-VALSARTAN 24-26 MG PO TABS
1.0000 | ORAL_TABLET | Freq: Two times a day (BID) | ORAL | Status: DC
  Administered 2024-11-23 – 2024-11-24 (×3): 1 via ORAL
  Filled 2024-11-23 (×4): qty 1

## 2024-11-23 MED ORDER — MAGNESIUM SULFATE 2 GM/50ML IV SOLN
2.0000 g | Freq: Once | INTRAVENOUS | Status: AC
  Administered 2024-11-23: 2 g via INTRAVENOUS
  Filled 2024-11-23: qty 50

## 2024-11-23 MED ORDER — ONDANSETRON HCL 4 MG/2ML IJ SOLN: 4.0000 mg | Freq: Four times a day (QID) | INTRAMUSCULAR | Status: DC | PRN

## 2024-11-23 MED ORDER — ACETAMINOPHEN 650 MG RE SUPP: 650.0000 mg | Freq: Four times a day (QID) | RECTAL | Status: DC | PRN

## 2024-11-23 MED ORDER — HYDROMORPHONE HCL 1 MG/ML IJ SOLN: 0.5000 mg | INTRAMUSCULAR | Status: DC | PRN

## 2024-11-23 MED ORDER — SODIUM CHLORIDE 0.9% FLUSH
3.0000 mL | Freq: Two times a day (BID) | INTRAVENOUS | Status: DC
  Administered 2024-11-23 – 2024-11-24 (×3): 3 mL via INTRAVENOUS

## 2024-11-23 MED ORDER — FUROSEMIDE 10 MG/ML IJ SOLN
40.0000 mg | Freq: Two times a day (BID) | INTRAMUSCULAR | Status: DC
  Administered 2024-11-24: 40 mg via INTRAVENOUS
  Filled 2024-11-23: qty 4

## 2024-11-23 MED ORDER — LEVOTHYROXINE SODIUM 75 MCG PO TABS
150.0000 ug | ORAL_TABLET | Freq: Every day | ORAL | Status: DC
  Administered 2024-11-24: 150 ug via ORAL
  Filled 2024-11-23: qty 2

## 2024-11-23 MED ORDER — ROSUVASTATIN CALCIUM 20 MG PO TABS
20.0000 mg | ORAL_TABLET | Freq: Every day | ORAL | Status: DC
  Administered 2024-11-23 – 2024-11-24 (×2): 20 mg via ORAL
  Filled 2024-11-23 (×3): qty 1

## 2024-11-23 MED ORDER — POTASSIUM CHLORIDE CRYS ER 20 MEQ PO TBCR: 20.0000 meq | EXTENDED_RELEASE_TABLET | ORAL | Status: DC

## 2024-11-23 MED ORDER — IPRATROPIUM BROMIDE 0.02 % IN SOLN: 0.5000 mg | Freq: Four times a day (QID) | RESPIRATORY_TRACT | Status: DC | PRN

## 2024-11-23 MED ORDER — SODIUM CHLORIDE 0.9% FLUSH
3.0000 mL | Freq: Two times a day (BID) | INTRAVENOUS | Status: DC
  Administered 2024-11-23 – 2024-11-24 (×2): 3 mL via INTRAVENOUS

## 2024-11-23 MED ORDER — BISACODYL 5 MG PO TBEC: 5.0000 mg | DELAYED_RELEASE_TABLET | Freq: Every day | ORAL | Status: DC | PRN

## 2024-11-23 MED ORDER — POTASSIUM CHLORIDE 10 MEQ/100ML IV SOLN
10.0000 meq | INTRAVENOUS | Status: AC
  Administered 2024-11-23 (×2): 10 meq via INTRAVENOUS
  Filled 2024-11-23 (×2): qty 100

## 2024-11-23 MED ORDER — TAMSULOSIN HCL 0.4 MG PO CAPS
0.4000 mg | ORAL_CAPSULE | Freq: Every day | ORAL | Status: DC
  Filled 2024-11-23 (×3): qty 1

## 2024-11-23 MED ORDER — AMIODARONE HCL 200 MG PO TABS
200.0000 mg | ORAL_TABLET | Freq: Every day | ORAL | Status: DC
  Administered 2024-11-23 – 2024-11-24 (×2): 200 mg via ORAL
  Filled 2024-11-23 (×2): qty 1

## 2024-11-23 MED ORDER — APIXABAN 2.5 MG PO TABS
2.5000 mg | ORAL_TABLET | Freq: Two times a day (BID) | ORAL | Status: DC
  Administered 2024-11-23 – 2024-11-24 (×3): 2.5 mg via ORAL
  Filled 2024-11-23 (×4): qty 1

## 2024-11-23 MED ORDER — POTASSIUM CHLORIDE CRYS ER 20 MEQ PO TBCR
20.0000 meq | EXTENDED_RELEASE_TABLET | Freq: Once | ORAL | Status: AC
  Administered 2024-11-23: 20 meq via ORAL
  Filled 2024-11-23 (×2): qty 1

## 2024-11-23 MED ORDER — HEPARIN SODIUM (PORCINE) 5000 UNIT/ML IJ SOLN: 5000.0000 [IU] | Freq: Three times a day (TID) | INTRAMUSCULAR | Status: DC

## 2024-11-23 MED ORDER — HYDRALAZINE HCL 20 MG/ML IJ SOLN: 10.0000 mg | INTRAMUSCULAR | Status: DC | PRN

## 2024-11-23 MED ORDER — TRAZODONE HCL 50 MG PO TABS
25.0000 mg | ORAL_TABLET | Freq: Every evening | ORAL | Status: DC | PRN
  Administered 2024-11-23: 25 mg via ORAL
  Filled 2024-11-23: qty 1

## 2024-11-23 MED ORDER — SENNOSIDES-DOCUSATE SODIUM 8.6-50 MG PO TABS: 1.0000 | ORAL_TABLET | Freq: Every evening | ORAL | Status: DC | PRN

## 2024-11-23 MED ORDER — SODIUM CHLORIDE 0.9 % IV SOLN: INTRAVENOUS | Status: DC

## 2024-11-23 NOTE — Consult Note (Signed)
 CARDIOLOGY CONSULT NOTE    Patient ID: Thomas [name] MRN: [id number], DOB/AGE: 08-16-1941 83 y.o.  Admit date: 11/23/2024 Date of Consult: 11/23/2024  Primary Physician: [name], MD Primary Cardiologist: Dr. Elmira Electrophysiologist: Dr. Waddell  Patient Profile: Thomas [name] is a 83 y.o. male with a history of atrial fibrillation, frequent PVCs, CKD, CAD who is being seen today for the evaluation of atypical chest pain at the request of Dr. Willette.  HPI:  Thomas [name] is a 83 y.o. 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  He awoke at about 2 AM with a sensation that he describes as having pins-and-needles in his chest.  He also experience shortness of breath, which he notes has been persistent since then.  He interpreted the symptoms as having fluid on his lungs.  He reports that the symptoms were similar to those he experienced in November.  Since then, he has been taking diuretics during the week.    Past Medical History:  Diagnosis Date   Arthritis    Bifascicular bundle branch block    Bigeminy 06/2019   CKD (chronic kidney disease) stage 3, GFR 30-59 ml/min (HCC) 10/26/2022   Coronary artery disease    takes Plavix  and ASA daily   GERD (gastroesophageal reflux disease)    occasional and will take Tums if needed   History of blood clots 2008   in chest   History of kidney stones late 90's   Hyperlipidemia    takes Simvastatin  daily   Hypertension    takes Maxzide  daily   Hypothyroidism    takes Synthroid  daily   Joint pain    Joint swelling    left knee   New onset atrial fibrillation (HCC) 02/04/2024   Pneumonia    at 83 years old, double      Home medications (Not in a hospital admission)     Physical Exam: Vitals:   11/23/24 1004 11/23/24 1015 11/23/24 1130 11/23/24 1235  BP: (!) 144/88 132/83 132/70  (!) 146/82  Pulse: (!) 40 (!) 48 (!) 55 80  Resp: 16 (!) 31 16 18   Temp: 98 F (36.7 C)     TempSrc: Oral     SpO2: 99% 97% 97% 98%  Weight:      Height:        Gen: Appears comfortable, well-nourished CV: iRRR, no dependent edema  Pulm: breathing easily, rales at right lower lung, cleared with cough  PERTINENT STUDIES SUMMARIZED:  Echocardiogram:      TTE 9/24/20254 -- EF 60 to 65%.  Mild concentric LVH.  Severely dilated left atrium.  Mildly dilated right atrium.  Trivial aortic regurgitation.  Mild mitral regurgitation.  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 I agree with diuretics  Secondary hypercoagulable state Continue amiodarone    For questions or updates, please contact [facility]  Please consult [email or web address] for contact info under Cardiology/STEMI.  Signed, Eulas Furbish, MD 11/23/2024 1:48 PM

## 2024-11-23 NOTE — Assessment & Plan Note (Signed)
-   Brief transient atypical chest pain pain and needle sensation on the left side - No acute changes on EKG -Troponin 15, 16 -Symptoms has resolved-Will monitor closely

## 2024-11-23 NOTE — Assessment & Plan Note (Addendum)
 HFpEF  - Elevated BNP 526.5  - With bilateral lower extremity edema, CTA -reviewed mild bilateral pleural effusion -Initiating IV Lasix  40 mg twice daily -Monitoring I's and O's, daily weight -Reviewing home medication: Resuming Entresto , statins,  Last Echo: 09/04/2024, EF 60-65%, normal LV function, mild concentric LVH

## 2024-11-23 NOTE — Assessment & Plan Note (Addendum)
-   With chronic Foley catheter,-since July, catheter exchanged q. Monthly  On alpha 1 adrenergic receptor antagonist- Silodosin   (Not on our formulary )pharmaceutical substituted to Flomax  -Monitoring voiding

## 2024-11-23 NOTE — Plan of Care (Signed)

## 2024-11-23 NOTE — Assessment & Plan Note (Signed)
 Continue Synthroid 

## 2024-11-23 NOTE — Assessment & Plan Note (Signed)
 Due to BPH To follow-up, q. monthly exchange at the urologist

## 2024-11-23 NOTE — Assessment & Plan Note (Addendum)
 May be associated with chronic Foley catheter versus colonization -Foley catheter placement for BPH since July, being exchanged q. Monthly -Reporting of some burning sensation  UA consistent with urinary tract infection: Positive for leukocyte esterase, positive for nitrites, WBC > 50 Will obtain urine cultures -Will initiate IV antibiotics of Rocephin  - Will treat empirically, or mindful of chronic colonization with chronic Foley catheter in place

## 2024-11-23 NOTE — ED Triage Notes (Signed)
 Pt bib [facility] from home, Woke up at 2am w CP feels like pins and needles. Also c/o of shob. Hx of two stents. VSS. Afib 60-100  20 LAC

## 2024-11-23 NOTE — Assessment & Plan Note (Signed)
-   History of coronary artery disease, with 2 stents -Continue Eliquis , continue statins,

## 2024-11-23 NOTE — Hospital Course (Addendum)
 [name] is a with PMH/o  HTN, HLD,  CAD w 2 stents,  A-fib on Eliquis , CKD IIIa, BPH-with chronic indwelling catheter since July 2025,  CHF, hypothyroidism, Bifascicular bundle branch block, GERD .[name]  Presenting with shortness of breath.. Patient reports shortness of breath getting worse since this morning.  Especially when laying down, also worsening bilateral lower extremity swelling.   With some chest pain sensation which has been going on over the past month.  Reports that he is compliant with his medication including diuretics which is scheduled for Mondays Wednesdays and Fridays. Also stating some burning sensation, and discomfort when making urine- as it goes through the Foley catheter.    ED Evaluation: Blood pressure (!) 146/82, pulse 80, temperature 98 F (36.7 C), temperature source Oral, resp. rate 18, height 6' (1.829 m), weight 87.1 kg, SpO2 98%. LABs: CBC within normal limits, CMP within normal nurser exception of potassium 3.1, creatinine 1.70, calcium  8.0, troponin 16, INR 1.3, glucose 114, BNP 526.5, magnesium  1.3 CTA: Negative for PE, trace of bilateral pleural effusion, calcified  coronary arteries Respiratory panel influenza A, B, RSV, SARS-CoV-2 all negative UA positive for nitrites, WBC> 50 CXR -no acute cardiopulmonary abnormalities

## 2024-11-23 NOTE — Assessment & Plan Note (Signed)
 Will continue Eliquis , amiodarone ,

## 2024-11-23 NOTE — H&P (Addendum)
 History and Physical   Patient: Thomas [name]                            PCP: [name], MD                    DOB: 27-May-1941            DOA: 11/23/2024 FMW:[id number]             DOS: 11/23/2024, 2:38 PM  [name], MD  Patient coming from:   HOME  I have personally reviewed patient's medical records, in electronic medical records, including:  [facility], and care everywhere.    Chief Complaint:   Chief Complaint  Patient presents with   Chest Pain   Shortness of Breath    History of present illness:    Thomas [name] is a with PMH/o  HTN, HLD,  CAD w 2 stents,  A-fib on Eliquis , CKD IIIa, BPH-with chronic indwelling catheter since July 2025,  CHF, hypothyroidism, Bifascicular bundle branch block, GERD .[name]  Presenting with shortness of breath.. Patient reports shortness of breath getting worse since this morning.  Especially when laying down, also worsening bilateral lower extremity swelling.   With some chest pain sensation which has been going on over the past month.  Reports that he is compliant with his medication including diuretics which is scheduled for Mondays Wednesdays and Fridays. Also stating some burning sensation, and discomfort when making urine- as it goes through the Foley catheter.    ED Evaluation: Blood pressure (!) 146/82, pulse 80, temperature 98 F (36.7 C), temperature source Oral, resp. rate 18, height 6' (1.829 m), weight 87.1 kg, SpO2 98%. LABs: CBC within normal limits, CMP within normal nurser exception of potassium 3.1, creatinine 1.70, calcium  8.0, troponin 16, INR 1.3, glucose 114, BNP 526.5, magnesium  1.3 CTA: Negative for PE, trace of bilateral pleural effusion, calcified  coronary arteries Respiratory panel influenza A, B, RSV, SARS-CoV-2 all negative UA positive for nitrites, WBC> 50 CXR -no acute cardiopulmonary abnormalities    Patient Denies having: Fever, Chills, Cough, SOB, Chest Pain, Abd pain,  N/V/D, headache, dizziness, lightheadedness,  Dysuria, Joint pain, rash, open wounds    Review of Systems: As per HPI, otherwise 10 point review of systems were negative.   ----------------------------------------------------------------------------------------------------------------------  Allergies[1]  Home MEDs:  Prior to Admission medications  Medication Sig Start Date End Date Taking? Authorizing Provider  amiodarone  (PACERONE ) 200 MG tablet TAKE 1 TABLET BY MOUTH ONCE DAILY MONDAY THROUGH FRIDAY 02/15/24  Yes Waddell Danelle ORN, MD  apixaban  (ELIQUIS ) 2.5 MG TABS tablet Take 1 tablet (2.5 mg total) by mouth 2 (two) times daily. 08/07/24  Yes Aniceto Daphne CROME, NP  furosemide  (LASIX ) 20 MG tablet Take 1 tablet (20 mg total) by mouth 2 (two) times daily for 2 days, THEN 1 tablet (20 mg total) every Monday, Wednesday, and Friday. Take 1 tablet by mouth three times per week on Mondays, Wednesdays, and Fridays. 05/05/24  Yes Laurence Locus, DO  levothyroxine  (SYNTHROID ) 175 MCG tablet Take 175 mcg by mouth daily before breakfast. 01/04/18  Yes [provider]  MYRBETRIQ 50 MG TB24 tablet Take 50 mg by mouth daily. 11/12/24  Yes [provider]  potassium chloride  SA (KLOR-CON  M) 20 MEQ tablet Take 1 tablet (20 mEq total) by mouth every Monday, Wednesday, and Friday. 05/06/24  Yes Laurence Locus, DO  rosuvastatin  (CRESTOR ) 20  MG tablet Take 1 tablet (20 mg total) by mouth daily. 08/06/24  Yes Patwardhan, Newman PARAS, MD  sacubitril -valsartan  (ENTRESTO ) 24-26 MG TAKE 1 TABLET BY MOUTH TWICE DAILY 04/22/24  Yes Nahser, Aleene PARAS, MD    PRN MEDs: acetaminophen  **OR** acetaminophen , bisacodyl , hydrALAZINE , HYDROmorphone  (DILAUDID ) injection, ipratropium, ondansetron  **OR** ondansetron  (ZOFRAN ) IV, oxyCODONE , senna-docusate, sodium phosphate , traZODone   Past Medical History:  Diagnosis Date   Arthritis    Bifascicular bundle branch block    Bigeminy 06/2019   CKD (chronic kidney disease) stage 3,  GFR 30-59 ml/min (HCC) 10/26/2022   Coronary artery disease    takes Plavix  and ASA daily   GERD (gastroesophageal reflux disease)    occasional and will take Tums if needed   History of blood clots 2008   in chest   History of kidney stones late 90's   Hyperlipidemia    takes Simvastatin  daily   Hypertension    takes Maxzide  daily   Hypothyroidism    takes Synthroid  daily   Joint pain    Joint swelling    left knee   New onset atrial fibrillation (HCC) 02/04/2024   Pneumonia    at 83 years old, double    Past Surgical History:  Procedure Laterality Date   ARTERY BIOPSY Left 12/15/2021   Procedure: LEFT TEMPORAL ARTERY BIOPSY;  Surgeon: Magda Debby SAILOR, MD;  Location: [facility] OR;  Service: Vascular;  Laterality: Left;   CARDIAC CATHETERIZATION     CARDIOVERSION N/A 05/17/2024   Procedure: CARDIOVERSION;  Surgeon: Jeffrie Oneil BROCKS, MD;  Location: [facility] INVASIVE CV LAB;  Service: Cardiovascular;  Laterality: N/A;   CHOLECYSTECTOMY N/A 06/06/2018   Procedure: LAPAROSCOPIC CHOLECYSTECTOMY WITH INTRAOPERATIVE CHOLANGIOGRAM;  Surgeon: Belinda Cough, MD;  Location: [facility] OR;  Service: General;  Laterality: N/A;   CORONARY ANGIOPLASTY  05/21/2007   lad stent/taxus stent   CYSTOSCOPY WITH RETROGRADE PYELOGRAM, URETEROSCOPY AND STENT PLACEMENT Right 03/29/2022   Procedure: CYSTOSCOPY WITH RETROGRADE PYELOGRAM AND STENT PLACEMENT;  Surgeon: Carolee Sherwood JONETTA DOUGLAS, MD;  Location: [facility] ORS;  Service: Urology;  Laterality: Right;   CYSTOSCOPY/URETEROSCOPY/HOLMIUM LASER/STENT PLACEMENT Right 04/18/2022   Procedure: CYSTOSCOPY\ RIGHT URETEROSCOPY/STONE BASKET EXTRACTION/STENT PLACEMENT;  Surgeon: Carolee Sherwood JONETTA DOUGLAS, MD;  Location: [facility] ORS;  Service: Urology;  Laterality: Right;  60 MINS FOR CASE   HEMORROIDECTOMY  late 1970's   HERNIA REPAIR Right 1980's   IR CYSTOSTOMY TUBE PLACEMENT/BLADDER ASPIRATION  07/09/2024   RIGHT/LEFT HEART CATH AND CORONARY ANGIOGRAPHY N/A 10/26/2022   Procedure: RIGHT/LEFT HEART CATH AND  CORONARY ANGIOGRAPHY;  Surgeon: Anner Alm ORN, MD;  Location: [facility] INVASIVE CV LAB;  Service: Cardiovascular;  Laterality: N/A;   TOTAL HIP ARTHROPLASTY Right 10/28/2013   Procedure: RIGHT TOTAL HIP ARTHROPLASTY ANTERIOR APPROACH;  Surgeon: Dempsey LULLA Moan, MD;  Location: [facility] ORS;  Service: Orthopedics;  Laterality: Right;   TOTAL HIP ARTHROPLASTY Left 05/08/2015   Procedure: LEFT TOTAL HIP ARTHROPLASTY ANTERIOR APPROACH;  Surgeon: Dempsey Moan, MD;  Location: [facility] OR;  Service: Orthopedics;  Laterality: Left;     reports that he quit smoking about 9 years ago. His smoking use included cigarettes. He started smoking about 61 years ago. He has a 13 pack-year smoking history. He has never used smokeless tobacco. He reports that he does not currently use alcohol. He reports that he does not use drugs.   Family History  Problem Relation Age of Onset   CAD Mother    Heart failure Mother    Pneumonia Father    Heart failure  Father     Physical Exam:   Vitals:   11/23/24 1015 11/23/24 1130 11/23/24 1235 11/23/24 1422  BP: 132/83 132/70 (!) 146/82   Pulse: (!) 48 (!) 55 80   Resp: (!) 31 16 18    Temp:    98.2 F (36.8 C)  TempSrc:    Oral  SpO2: 97% 97% 98%   Weight:      Height:       Constitutional: NAD, calm, comfortable Eyes: PERRL, lids and conjunctivae normal ENMT: Mucous membranes are moist. Posterior pharynx clear of any exudate or lesions.Normal dentition.  Neck: normal, supple, no masses, no thyromegaly Respiratory: clear to auscultation bilaterally, no wheezing, no crackles. Normal respiratory effort. No accessory muscle use.  Cardiovascular: Regular rate and rhythm, no murmurs / rubs / gallops. No extremity edema. 2+ pedal pulses. No carotid bruits.  Abdomen: no tenderness, no masses palpated. No hepatosplenomegaly. Bowel sounds positive.  Musculoskeletal: no clubbing / cyanosis. No joint deformity upper and lower extremities. Good ROM, no contractures. Normal muscle tone.   Neurologic: CN II-XII grossly intact. Sensation intact, DTR normal. Strength 5/5 in all 4.  Psychiatric: Normal judgment and insight. Alert and oriented x 3. Normal mood.  Skin: no rashes, lesions, ulcers. No induration Chronic Foley catheter  -in place since July 2025          Labs on admission:    I have personally reviewed following labs and imaging studies  CBC: Recent Labs  Lab 11/23/24 1012  WBC 9.7  NEUTROABS 7.9*  HGB 13.3  HCT 40.2  MCV 106.9*  PLT 238   Basic Metabolic Panel: Recent Labs  Lab 11/23/24 1012  NA 140  K 3.1*  CL 107  CO2 23  GLUCOSE 114*  BUN 22  CREATININE 1.71*  CALCIUM  8.0*  MG 1.3*  PHOS 2.3*    Recent Labs  Lab 11/23/24 1022  INR 1.3*    Urine analysis:    Component Value Date/Time   COLORURINE YELLOW 11/23/2024 1021   APPEARANCEUR CLOUDY (A) 11/23/2024 1021   LABSPEC 1.023 11/23/2024 1021   PHURINE 7.0 11/23/2024 1021   GLUCOSEU NEGATIVE 11/23/2024 1021   HGBUR SMALL (A) 11/23/2024 1021   BILIRUBINUR NEGATIVE 11/23/2024 1021   KETONESUR 5 (A) 11/23/2024 1021   PROTEINUR 100 (A) 11/23/2024 1021   UROBILINOGEN 0.2 04/27/2015 1033   NITRITE POSITIVE (A) 11/23/2024 1021   LEUKOCYTESUR SMALL (A) 11/23/2024 1021    Last A1C:  No results found for: HGBA1C   Radiologic Exams on Admission:   DG Chest 2 View Result Date: 11/23/2024 CLINICAL DATA:  SOB EXAM: CHEST - 2 VIEW COMPARISON:  October 28, 2024, July 18, 2022 FINDINGS: The cardiomediastinal silhouette is unchanged and enlarged in contour.Atherosclerotic calcifications. No pleural effusion. No pneumothorax. No acute pleuroparenchymal abnormality. Severe degenerative changes of the RIGHT shoulder. Visualized abdomen is unremarkable. Multilevel degenerative changes of the thoracic spine. IMPRESSION: No acute cardiopulmonary abnormality. Electronically Signed   By: Corean Salter M.D.   On: 11/23/2024 11:02    EKG:   Independently reviewed.  Orders placed  or performed during the hospital encounter of 11/23/24   ED EKG   ED EKG   EKG 12-Lead   EKG 12-Lead   EKG 12-Lead   --------------------------------------------------------------------------------------------------------------------------------    Assessment / Plan:   Principal Problem:   Acute on chronic combined systolic and diastolic CHF (congestive heart failure) (HCC) Active Problems:   Urinary tract infection   Atherosclerosis of native coronary artery of native heart  without angina pectoris   Hyperlipidemia   PAF (paroxysmal atrial fibrillation) (HCC)   Acute exacerbation of CHF (congestive heart failure) (HCC)   BPH (benign prostatic hyperplasia)   Hypothyroidism, acquired   Stage 3b chronic kidney disease (CKD) (HCC)   Benign hypertension   Chronic indwelling Foley catheter   Atypical chest pain   Assessment and Plan: * Acute on chronic combined systolic and diastolic CHF (congestive heart failure) (HCC) HFpEF  - Elevated BNP 526.5  - With bilateral lower extremity edema, CTA -reviewed mild bilateral pleural effusion -Initiating IV Lasix  40 mg twice daily -Monitoring I's and O's, daily weight -Reviewing home medication: Resuming Entresto , statins,  Last Echo: 09/04/2024, EF 60-65%, normal LV function, mild concentric LVH  Atherosclerosis of native coronary artery of native heart without angina pectoris - History of coronary artery disease, with 2 stents -Continue Eliquis , continue statins,   Urinary tract infection May be associated with chronic Foley catheter versus colonization -Foley catheter placement for BPH since July, being exchanged q. Monthly -Reporting of some burning sensation  UA consistent with urinary tract infection: Positive for leukocyte esterase, positive for nitrites, WBC > 50 Will obtain urine cultures -Will initiate IV antibiotics of Rocephin  - Will treat empirically, or mindful of chronic colonization with chronic Foley catheter in  place  PAF (paroxysmal atrial fibrillation) (HCC) Will continue Eliquis , amiodarone ,  Hyperlipidemia Continue statins  Benign hypertension BP remains stable, reviewing home medication resuming accordingly including Entresto , -Monitoring closely  Stage 3b chronic kidney disease (CKD) (HCC) BUN/creatinine at baseline Monitoring BUN/creatinine closely Avoid nephrotoxins, hypotension, Lab Results  Component Value Date   CREATININE 1.71 (H) 11/23/2024   CREATININE 1.58 (H) 10/28/2024   CREATININE 1.89 (H) 07/29/2024     Hypothyroidism, acquired Continue Synthroid   BPH (benign prostatic hyperplasia) - With chronic Foley catheter,-since July, catheter exchanged q. Monthly  On alpha 1 adrenergic receptor antagonist- Silodosin   (Not on our formulary )pharmaceutical substituted to Flomax  -Monitoring voiding  Atypical chest pain - Brief transient atypical chest pain pain and needle sensation on the left side - No acute changes on EKG -Troponin 15, 16 -Symptoms has resolved-Will monitor closely  Chronic indwelling Foley catheter Due to BPH To follow-up, q. monthly exchange at the urologist     Consults called: Cardiology -------------------------------------------------------------------------------------------------------------------------------------------- DVT prophylaxis:  apixaban  (ELIQUIS ) tablet 2.5 mg Start: 11/23/24 1345 SCDs Start: 11/23/24 1331 apixaban  (ELIQUIS ) tablet 2.5 mg   Code Status:   Code Status: Full Code   Admission status: Patient will be admitted as Observation, with a greater than 2 midnight length of stay. Level of care: Telemetry   Family Communication:  none at bedside  (The above findings and plan of care has been discussed with patient in detail, the patient expressed understanding and agreement of above plan)   --------------------------------------------------------------------------------------------------------------------------------------------------  Disposition Plan:  Anticipated 1-2 days Status is: Observation The patient remains OBS appropriate and will d/c before 2 midnights.   --------------------------------------------------------------------------------------------------------------------------------  Time spent:  3  Min.  Was spent seeing and evaluating the patient, reviewing all medical records, drawn plan of care.  SIGNED: Adriana DELENA Grams, MD, FHM. FAAFP. [place] [facility], Pager  (Please use [email or web address] to page/ or secure chat through epic) If 7PM-7AM, please contact night-coverage [email or web address],  11/23/2024, 2:38 PM     [1]  Allergies Allergen Reactions   Hydrocodone  Nausea And Vomiting and Other (See Comments)    Dizziness and upset stomach   Oxycodone  Nausea And Vomiting and Nausea Only   Robaxin  [Methocarbamol ]  Nausea And Vomiting   Ultram  [Tramadol ] Nausea And Vomiting

## 2024-11-23 NOTE — Assessment & Plan Note (Signed)
 Continue statins

## 2024-11-23 NOTE — Assessment & Plan Note (Signed)
 BUN/creatinine at baseline Monitoring BUN/creatinine closely Avoid nephrotoxins, hypotension, Lab Results  Component Value Date   CREATININE 1.71 (H) 11/23/2024   CREATININE 1.58 (H) 10/28/2024   CREATININE 1.89 (H) 07/29/2024

## 2024-11-23 NOTE — ED Provider Notes (Signed)
 [facility] Provider Note   CSN: [id number] Arrival date & time: 11/23/24  9048     Patient presents with: Chest Pain and Shortness of Breath   [name] is a 83 y.o. male.   83 year old male with a past medical history of hypertension, CAD, prior blood clots, bigeminy, atrial fibrillation on Eliquis  presents to the ED with sudden onset of shortness of breath which occurred around 2 AM.  Patient reports he was lying in bed when suddenly began to feel short of breath, he also noted some pins-and-needles to the left side of his chest, stabbing sensation.  He ports this has occurred in the past approximately 1 month ago, where he I had a lot of fluid in my lungs , he feels that this is the same at this time.  He is currently on a fluid pill which he only takes Monday, Wednesday, Friday.  Does report some swelling to bilateral lower extremities.  He has not taken any medication for improvement in his symptoms.  There is no exacerbating factors.  He did have a hard time getting from the bed onto a chair.  No fevers, no cough, no other complaints reported.  Patient does have a history of underlying CAD, 2 prior stents placed in the past.  The history is provided by the patient.  Chest Pain Associated symptoms: shortness of breath   Associated symptoms: no abdominal pain, no back pain, no fever, no nausea and no vomiting   Shortness of Breath Associated symptoms: chest pain   Associated symptoms: no abdominal pain, no fever and no vomiting        Prior to Admission medications  Medication Sig Start Date End Date Taking? Authorizing Provider  amiodarone  (PACERONE ) 200 MG tablet TAKE 1 TABLET BY MOUTH ONCE DAILY MONDAY THROUGH FRIDAY 02/15/24   Waddell Danelle ORN, MD  apixaban  (ELIQUIS ) 2.5 MG TABS tablet Take 1 tablet (2.5 mg total) by mouth 2 (two) times daily. 08/07/24   Aniceto Daphne CROME, NP  furosemide  (LASIX ) 20 MG tablet Take 1 tablet (20 mg  total) by mouth 2 (two) times daily for 2 days, THEN 1 tablet (20 mg total) every Monday, Wednesday, and Friday. Take 1 tablet by mouth three times per week on Mondays, Wednesdays, and Fridays. 05/05/24 07/29/24  Laurence Locus, DO  ipratropium (ATROVENT ) 0.06 % nasal spray Place 2 sprays into both nostrils 4 (four) times daily as needed for rhinitis.    [provider]  levothyroxine  (SYNTHROID , LEVOTHROID) 150 MCG tablet Take 150 mcg by mouth daily before breakfast. 01/04/18   [provider]  potassium chloride  SA (KLOR-CON  M) 20 MEQ tablet Take 1 tablet (20 mEq total) by mouth every Monday, Wednesday, and Friday. 05/06/24 07/29/24  Laurence Locus, DO  rosuvastatin  (CRESTOR ) 20 MG tablet Take 1 tablet (20 mg total) by mouth daily. 08/06/24   Patwardhan, Newman PARAS, MD  sacubitril -valsartan  (ENTRESTO ) 24-26 MG TAKE 1 TABLET BY MOUTH TWICE DAILY 04/22/24   Nahser, Aleene PARAS, MD  silodosin  (RAPAFLO ) 4 MG CAPS capsule Take 4 mg by mouth daily. 06/15/24   [provider]    Allergies: Hydrocodone , Oxycodone , Robaxin  [methocarbamol ], and Ultram  [tramadol ]    Review of Systems  Constitutional:  Negative for fever.  Respiratory:  Positive for shortness of breath.   Cardiovascular:  Positive for chest pain.  Gastrointestinal:  Negative for abdominal pain, nausea and vomiting.  Musculoskeletal:  Negative for back pain.  All other systems reviewed  and are negative.   Updated Vital Signs BP (!) 146/82   Pulse 80   Temp 98 F (36.7 C) (Oral)   Resp 18   Ht 6' (1.829 m)   Wt 87.1 kg   SpO2 98%   BMI 26.04 kg/m   Physical Exam Vitals and nursing note reviewed.  Constitutional:      Appearance: He is well-developed.  HENT:     Head: Normocephalic and atraumatic.  Cardiovascular:     Rate and Rhythm: Rhythm irregular.  Pulmonary:     Breath sounds: Examination of the right-lower field reveals decreased breath sounds. Examination of the left-lower field reveals decreased breath  sounds. Decreased breath sounds present.  Chest:     Chest wall: No tenderness.  Abdominal:     Palpations: Abdomen is soft.  Musculoskeletal:     Cervical back: Normal range of motion and neck supple.     Right lower leg: No edema.     Left lower leg: Edema present.  Skin:    General: Skin is warm and dry.  Neurological:     Mental Status: He is alert and oriented to person, place, and time.     (all labs ordered are listed, but only abnormal results are displayed) Labs Reviewed  BASIC METABOLIC PANEL WITH GFR - Abnormal; Notable for the following components:      Result Value   Potassium 3.1 (*)    Glucose, Bld 114 (*)    Creatinine, Ser 1.71 (*)    Calcium  8.0 (*)    GFR, Estimated 39 (*)    All other components within normal limits  CBC WITH DIFFERENTIAL/PLATELET - Abnormal; Notable for the following components:   RBC 3.76 (*)    MCV 106.9 (*)    MCH 35.4 (*)    Neutro Abs 7.9 (*)    All other components within normal limits  BRAIN NATRIURETIC PEPTIDE - Abnormal; Notable for the following components:   B Natriuretic Peptide 526.5 (*)    All other components within normal limits  URINALYSIS, ROUTINE W REFLEX MICROSCOPIC - Abnormal; Notable for the following components:   APPearance CLOUDY (*)    Hgb urine dipstick SMALL (*)    Ketones, ur 5 (*)    Protein, ur 100 (*)    Nitrite POSITIVE (*)    Leukocytes,Ua SMALL (*)    Bacteria, UA FEW (*)    All other components within normal limits  PROTIME-INR - Abnormal; Notable for the following components:   Prothrombin Time 17.2 (*)    INR 1.3 (*)    All other components within normal limits  MAGNESIUM  - Abnormal; Notable for the following components:   Magnesium  1.3 (*)    All other components within normal limits  RESP PANEL BY RT-PCR (RSV, FLU A&B, COVID)  RVPGX2  EXPECTORATED SPUTUM ASSESSMENT W GRAM STAIN, RFLX TO RESP C  PHOSPHORUS  TROPONIN I (HIGH SENSITIVITY)    EKG: EKG  Interpretation Date/Time:  Saturday November 23 2024 10:04:07 EST Ventricular Rate:  84 PR Interval:    QRS Duration:  168 QT Interval:  448 QTC Calculation: 530 R Axis:   -73  Text Interpretation: Atrial fibrillation Multiple ventricular premature complexes RBBB and LAFB Confirmed by Neysa Clap [phone]) on 11/23/2024 10:36:20 AM  Radiology: [facility] Chest 2 View Result Date: 11/23/2024 CLINICAL DATA:  SOB EXAM: CHEST - 2 VIEW COMPARISON:  October 28, 2024, July 18, 2022 FINDINGS: The cardiomediastinal silhouette is unchanged and enlarged in contour.Atherosclerotic calcifications. No pleural  effusion. No pneumothorax. No acute pleuroparenchymal abnormality. Severe degenerative changes of the RIGHT shoulder. Visualized abdomen is unremarkable. Multilevel degenerative changes of the thoracic spine. IMPRESSION: No acute cardiopulmonary abnormality. Electronically Signed   By: Corean Salter M.D.   On: 11/23/2024 11:02     .Critical Care  Performed by: Sheng Pritz, PA-C Authorized by: Sargon Scouten, PA-C   Critical care provider statement:    Critical care time (minutes):  45   Critical care start time:  11/23/2024 12:30 PM   Critical care end time:  11/23/2024 1:15 PM   Critical care was necessary to treat or prevent imminent or life-threatening deterioration of the following conditions:  Metabolic crisis and cardiac failure    Medications Ordered in the ED  potassium chloride  10 mEq in 100 mL IVPB (10 mEq Intravenous New Bag/Given 11/23/24 1225)  heparin  injection 5,000 Units (has no administration in time range)  sodium chloride  flush (NS) 0.9 % injection 3 mL (has no administration in time range)  sodium chloride  flush (NS) 0.9 % injection 3 mL (has no administration in time range)  acetaminophen  (TYLENOL ) tablet 650 mg (has no administration in time range)    Or  acetaminophen  (TYLENOL ) suppository 650 mg (has no administration in time range)  oxyCODONE  (Oxy IR/ROXICODONE )  immediate release tablet 5 mg (has no administration in time range)  HYDROmorphone  (DILAUDID ) injection 0.5-1 mg (has no administration in time range)  traZODone  (DESYREL ) tablet 25 mg (has no administration in time range)  senna-docusate (Senokot-S) tablet 1 tablet (has no administration in time range)  bisacodyl  (DULCOLAX) EC tablet 5 mg (has no administration in time range)  sodium phosphate  (FLEET) enema 1 enema (has no administration in time range)  ondansetron  (ZOFRAN ) tablet 4 mg (has no administration in time range)    Or  ondansetron  (ZOFRAN ) injection 4 mg (has no administration in time range)  ipratropium (ATROVENT ) nebulizer solution 0.5 mg (has no administration in time range)  hydrALAZINE  (APRESOLINE ) injection 10 mg (has no administration in time range)  magnesium  sulfate IVPB 2 g 50 mL (0 g Intravenous Stopped 11/23/24 1323)    Clinical Course as of 11/23/24 1340  Sat Nov 23, 2024  1217 Potassium(!): 3.1 Will replace via IV. [JS]  1217 Creatinine(!): 1.71 At his baseline but a reassuring BUN. [JS]  1217 Magnesium (!): 1.3 Will replace this via IV 2 g. [JS]  1218 B Natriuretic Peptide(!): 526.5 Elevated, does appear fluid overloaded on exam. [JS]    Clinical Course User Index [JS] Jamell Laymon, PA-C                                 Medical Decision Making Amount and/or Complexity of Data Reviewed Labs: ordered. Decision-making details documented in ED Course. Radiology: ordered.  Risk Prescription drug management.   This patient presents to the ED for concern of shortness of breath, this involves a number of treatment options, and is a complaint that carries with it a high risk of complications and morbidity.  The differential diagnosis includes faction, heart failure, versus ACS.   Co morbidities: Discussed in HPI   Brief History:  See HPI.  EMR reviewed including pt PMHx, past surgical history and past visits to ER.   See HPI for more details  Lab  Tests:  I ordered and independently interpreted labs.  The pertinent results include:    CBC with no leukocytosis.  Hemoglobin is within normal limits.  BMP  with some mild hypokalemia, creatinine is at his baseline.  Magnesium  is also low, will IV replace both of these at this time.  Imaging Studies:  NAD. I personally reviewed all imaging studies and no acute abnormality found. I agree with radiology interpretation.  Cardiac Monitoring:  The patient was maintained on a cardiac monitor.  I personally viewed and interpreted the cardiac monitored which showed an underlying rhythm of: NSR EKG non-ischemic  Medicines ordered:  I ordered medication including potassium, magnesium  for electrolyte replacement Reevaluation of the patient after these medicines showed that the patient stayed the same I have reviewed the patients home medicines and have made adjustments as needed  Consults:  I requested consultation with cardiology,  and discussed lab and imaging findings as well as pertinent plan - they were informed of the case and will consult as requested.  Reevaluation:  After the interventions noted above I re-evaluated patient and found that they have :stayed the same  Social Determinants of Health:  The patient's social determinants of health were a factor in the care of this patient  Problem List / ED Course:  Patient presented to the ED with a chief complaint of shortness of breath, chest pain which began around 1 AM when he started to feel pins-and-needles to his left chest, felt that he was very winded, reports a similar episode about a month ago where he had too much fluid in my lungs .  He did not take any medication for improvement in symptoms.  He is on a fluid pill daily, he reports he has not missed any doses.  According to him his urologist that placed a suprapubic catheter told him to take approximately 8-9 bottles of water  daily. His labs here are remarkable for serum  kalemia with a potassium of 3.1, hypomagnesemia with a magnesium  of 1.3, both have been IV replaced.  CBC with any no leukocytosis.  His creatinine is at his baseline.  His UA does have some nitrites along with leukocytes however he does wear a Foley catheter at baseline.  He is not endorsing any hematuria, or discomfort whenever he attempts to void. X-ray without any signs of pulmonary edema, he does appear fluid overloaded on exam.  I do feel that he warrants diuresis with a BNP of 526.  However, due to electrolyte derangement we will have these replaced prior to diuresis.  I do feel that patient with underlying multiple comorbidities meets criteria for admission to the hospital for further management. Spoke to Dr. Willette hospitalist service appreciate his assistance.  Patient is hemodynamically stable for admission.  Dispostion:  After consideration of the diagnostic results and the patients response to treatment, I feel that the patent would benefit from admission for further management of acute exacerbation of heart failure.   Portions of this note were generated with Scientist, clinical (histocompatibility and immunogenetics). Dictation errors may occur despite best attempts at proofreading.   Final diagnoses:  Shortness of breath  Acute cystitis with hematuria    ED Discharge Orders     None          Maureen Broad, PA-C 11/23/24 1340    Neysa Caron PARAS, [place] 11/23/24 1527

## 2024-11-23 NOTE — Assessment & Plan Note (Signed)
 BP remains stable, reviewing home medication resuming accordingly including Entresto , -Monitoring closely

## 2024-11-24 ENCOUNTER — Other Ambulatory Visit (HOSPITAL_COMMUNITY): Payer: Self-pay

## 2024-11-24 DIAGNOSIS — I5043 Acute on chronic combined systolic (congestive) and diastolic (congestive) heart failure: Secondary | ICD-10-CM | POA: Diagnosis not present

## 2024-11-24 LAB — BASIC METABOLIC PANEL WITH GFR
Anion gap: 9 (ref 5–15)
BUN: 16 mg/dL (ref 8–23)
CO2: 27 mmol/L (ref 22–32)
Calcium: 8.3 mg/dL — ABNORMAL LOW (ref 8.9–10.3)
Chloride: 104 mmol/L (ref 98–111)
Creatinine, Ser: 1.47 mg/dL — ABNORMAL HIGH (ref 0.61–1.24)
GFR, Estimated: 47 mL/min — ABNORMAL LOW (ref >60)
Glucose, Bld: 95 mg/dL (ref 70–99)
Potassium: 3.4 mmol/L — ABNORMAL LOW (ref 3.5–5.1)
Sodium: 140 mmol/L (ref 135–145)

## 2024-11-24 LAB — PROTIME-INR
INR: 1.4 — ABNORMAL HIGH (ref 0.8–1.2)
Prothrombin Time: 18.3 s — ABNORMAL HIGH (ref 11.4–15.2)

## 2024-11-24 LAB — APTT: aPTT: 38 s — ABNORMAL HIGH (ref 24–36)

## 2024-11-24 LAB — MAGNESIUM: Magnesium: 1.6 mg/dL — ABNORMAL LOW (ref 1.7–2.4)

## 2024-11-24 LAB — BRAIN NATRIURETIC PEPTIDE: B Natriuretic Peptide: 896.6 pg/mL — ABNORMAL HIGH (ref 0.0–100.0)

## 2024-11-24 MED ORDER — SILODOSIN 4 MG PO CAPS: 4.0000 mg | ORAL_CAPSULE | Freq: Every day | ORAL | Status: DC

## 2024-11-24 MED ORDER — POTASSIUM CHLORIDE CRYS ER 20 MEQ PO TBCR: 20.0000 meq | EXTENDED_RELEASE_TABLET | Freq: Every day | ORAL | Status: DC

## 2024-11-24 MED ORDER — FUROSEMIDE 20 MG PO TABS
20.0000 mg | ORAL_TABLET | Freq: Every day | ORAL | 0 refills | Status: DC
  Filled 2024-11-24: qty 30, 30d supply, fill #0

## 2024-11-24 MED ORDER — FUROSEMIDE 20 MG PO TABS: 20.0000 mg | ORAL_TABLET | Freq: Every day | ORAL | 0 refills | Status: DC

## 2024-11-24 MED ORDER — FUROSEMIDE 20 MG PO TABS: 20.0000 mg | ORAL_TABLET | Freq: Every day | ORAL | Status: DC

## 2024-11-24 MED ORDER — POTASSIUM CHLORIDE CRYS ER 20 MEQ PO TBCR
40.0000 meq | EXTENDED_RELEASE_TABLET | Freq: Once | ORAL | Status: AC
  Administered 2024-11-24: 40 meq via ORAL
  Filled 2024-11-24: qty 2

## 2024-11-24 NOTE — Progress Notes (Signed)
 Progress Note  Patient Name: Thomas [name] Date of Encounter: 11/24/2024  Primary Cardiologist: Newman JINNY Lawrence, MD   Subjective   Feeling much better.  Inpatient Medications    Scheduled Meds:  amiodarone   200 mg Oral Daily   apixaban   2.5 mg Oral BID   furosemide   40 mg Intravenous BID   levothyroxine   150 mcg Oral QAC breakfast   [START ON 11/25/2024] potassium chloride  SA  20 mEq Oral Q M,W,F   rosuvastatin   20 mg Oral Daily   sacubitril -valsartan   1 tablet Oral BID   sodium chloride  flush  3 mL Intravenous Q12H   sodium chloride  flush  3 mL Intravenous Q12H   tamsulosin   0.4 mg Oral Daily   Continuous Infusions:  PRN Meds: acetaminophen  **OR** acetaminophen , bisacodyl , hydrALAZINE , HYDROmorphone  (DILAUDID ) injection, ipratropium, ondansetron  **OR** ondansetron  (ZOFRAN ) IV, oxyCODONE , senna-docusate, sodium phosphate , traZODone    Vital Signs    Vitals:   11/23/24 1532 11/23/24 1948 11/24/24 0033 11/24/24 0455  BP: (!) 156/83 (!) 135/93 126/73 123/67  Pulse: 83 66 61 73  Resp: 18 18 16 17   Temp: (!) 97.5 F (36.4 C) 98 F (36.7 C) 98.2 F (36.8 C) 98.3 F (36.8 C)  TempSrc: Oral Oral Oral Oral  SpO2: 99% 99% 97% 99%  Weight: 88.7 kg   88.5 kg  Height: 6' (1.829 m)       Intake/Output Summary (Last 24 hours) at 11/24/2024 0835 Last data filed at 11/24/2024 0700 Gross per 24 hour  Intake 614.42 ml  Output 850 ml  Net -235.58 ml   Filed Weights   11/23/24 0957 11/23/24 1532 11/24/24 0455  Weight: 87.1 kg 88.7 kg 88.5 kg    Telemetry    Atrial fibrillation - Personally Reviewed  ECG    No new - Personally Reviewed  Physical Exam   GEN: No acute distress.   Neck: No JVD Cardiac: iRRR Respiratory: Clear to auscultation bilaterally. GI: Soft, nontender, non-distended  MS: No edema; No deformity. Neuro:  Nonfocal  Psych: Normal affect   Labs    Chemistry Recent Labs  Lab 11/23/24 1012 11/24/24 0542  NA 140 140  K 3.1* 3.4*   CL 107 104  CO2 23 27  GLUCOSE 114* 95  BUN 22 16  CREATININE 1.71* 1.47*  CALCIUM  8.0* 8.3*  GFRNONAA 39* 47*  ANIONGAP 10 9     Hematology Recent Labs  Lab 11/23/24 1012  WBC 9.7  RBC 3.76*  HGB 13.3  HCT 40.2  MCV 106.9*  MCH 35.4*  MCHC 33.1  RDW 14.2  PLT 238    Cardiac EnzymesNo results for input(s): TROPONINI in the last 168 hours. No results for input(s): TROPIPOC in the last 168 hours.   BNP Recent Labs  Lab 11/23/24 1012 11/24/24 0542  BNP 526.5* 896.6*     DDimer No results for input(s): DDIMER in the last 168 hours.   Summary of Pertinent studies    TTE: EF 60-65%, mild concentric LVH    Patient Profile     83 y.o. 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 Will likely need to DC  on daily lasix   Secondary hypercoagulable state Continue apixaban  2.5 mg    For questions or updates, please contact [facility] Please consult [email or web address] for contact info under Cardiology/STEMI.      Signed, Eulas FORBES Furbish, MD 11/24/2024, 8:35 AM

## 2024-11-24 NOTE — Evaluation (Signed)
 Physical Therapy Evaluation Patient Details Name: Thomas [name] MRN: [id number] DOB: 19-Jul-1941 Today's Date: 11/24/2024  History of Present Illness  83 yo M adm 12/13 SOB and chest discomfort Pt workup for acute CHF and UTI (+) PMH CAD with 2 stents,  afib on eliquis , CKDIII, BPH indwelling cath 06/2024, CHF, hypothyroidism,  Clinical Impression  Prior to admission, pt was IND with all mobility and lives with his spouse in a single story home with 3 Ste and handrails. He still drives and is active with mobility daily. He currently is IND with bed mobility and transfers, and walking 259ft at [facility] without need for device. Practiced x3 steps with handrails, required SPV and cues for safety and to not allow heels to hang off. HR 75-83 with mobility, no reports of SOB or chest pain during session. Pt is at his functional baseline and does not require additional PT services. PT will sign off at this time. Please re-consult if there is a change in medical status.         If plan is discharge home, recommend the following: A little help with walking and/or transfers;A little help with bathing/dressing/bathroom;Assistance with cooking/housework;Assist for transportation   Can travel by private vehicle        Equipment Recommendations    Recommendations for Other Services       Functional Status Assessment       Precautions / Restrictions Precautions Precautions: None Restrictions Weight Bearing Restrictions Per Provider Order: No      Mobility  Bed Mobility Overal bed mobility: Independent                  Transfers Overall transfer level: Independent Equipment used: None                    Ambulation/Gait Ambulation/Gait assistance: Supervision Gait Distance (Feet): 200 Feet Assistive device: None            Stairs Stairs: Yes Stairs assistance: Supervision Stair Management: One rail Left, Two rails Number of Stairs: 3 General stair comments: uses  1-2 HR while completing, VC for safety and ensuring adequate placement of foot on step to not let heel hang off  Wheelchair Mobility     Tilt Bed    Modified Rankin (Stroke Patients Only)       Balance Overall balance assessment: Modified Independent                               Standardized Balance Assessment Standardized Balance Assessment : Dynamic Gait Index   Dynamic Gait Index Level Surface: Normal Change in Gait Speed: Normal Gait with Horizontal Head Turns: Normal Gait with Vertical Head Turns: Normal Gait and Pivot Turn: Mild Impairment Step Over Obstacle: Normal Step Around Obstacles: Normal Steps: Mild Impairment Total Score: 22       Pertinent Vitals/Pain Pain Assessment Pain Assessment: No/denies pain    Home Living Family/patient expects to be discharged to:: Private residence Living Arrangements: Spouse/significant other Available Help at Discharge: Available 24 hours/day;Family Type of Home: House Home Access: Stairs to enter Entrance Stairs-Rails: Right;Left;Can reach both Entrance Stairs-Number of Steps: 3   Home Layout: One level Home Equipment: Agricultural Consultant (2 wheels);Cane - single point;Grab bars - tub/shower;Tub bench;BSC/3in1      Prior Function Prior Level of Function : Independent/Modified Independent;Driving             Mobility Comments: IND with all mobility,  no use of DME, drives ADLs Comments: IND, assists with IADLs     Extremity/Trunk Assessment   Upper Extremity Assessment Upper Extremity Assessment: Overall WFL for tasks assessed    Lower Extremity Assessment Lower Extremity Assessment: Overall WFL for tasks assessed       Communication   Communication Communication: No apparent difficulties    Cognition Arousal: Alert Behavior During Therapy: WFL for tasks assessed/performed   PT - Cognitive impairments: No apparent impairments                         Following commands:  Intact       Cueing Cueing Techniques: Verbal cues     General Comments      Exercises     Assessment/Plan    PT Assessment Patient does not need any further PT services  PT Problem List         PT Treatment Interventions      PT Goals (Current goals can be found in the Care Plan section)  Acute Rehab PT Goals Patient Stated Goal: get back home PT Goal Formulation: With patient Time For Goal Achievement: 12/08/24 Potential to Achieve Goals: Good    Frequency       Co-evaluation               AM-PAC PT 6 Clicks Mobility  Outcome Measure Help needed turning from your back to your side while in a flat bed without using bedrails?: None Help needed moving from lying on your back to sitting on the side of a flat bed without using bedrails?: None Help needed moving to and from a bed to a chair (including a wheelchair)?: None Help needed standing up from a chair using your arms (e.g., wheelchair or bedside chair)?: None Help needed to walk in hospital room?: A Little Help needed climbing 3-5 steps with a railing? : A Little 6 Click Score: 22    End of Session Equipment Utilized During Treatment: Gait belt Activity Tolerance: Patient tolerated treatment well Patient left: in chair Nurse Communication: Mobility status PT Visit Diagnosis: Unsteadiness on feet (R26.81)    Time: 8964-8946 PT Time Calculation (min) (ACUTE ONLY): 18 min   Charges:   PT Evaluation $PT Eval Low Complexity: 1 Low             Megahn Killings H. Charlette Hennings, PT, DPT   [employer] 11/24/2024, 11:30 AM

## 2024-11-24 NOTE — Discharge Summary (Signed)
 Physician Discharge Summary  Thomas [name] FMW:[id number] DOB: Oct 31, 1941 DOA: 11/23/2024  PCP: [name], MD  Admit date: 11/23/2024 Discharge date: 11/24/2024  Admitted From:  Discharge disposition: home   Recommendations for Outpatient Follow-Up:   Close follow-up with urology-due for suprapubic catheter change in the a.m. (please review UA-doubt UTI) BMP 1 week Follow-up with cardiology for adjustment of diuretics   Discharge Diagnosis:    Discharge Condition: Improved.  Diet recommendation: Low sodium, heart healthy  Wound care: None.  Code status: Full.   History of Present Illness:   Thomas [name] is a with PMH/o  HTN, HLD,  CAD w 2 stents,  A-fib on Eliquis , CKD IIIa, BPH-with chronic indwelling catheter since July 2025,  CHF, hypothyroidism, Bifascicular bundle branch block, GERD .[name]  Presenting with shortness of breath.. Patient reports shortness of breath getting worse since this morning.  Especially when laying down, also worsening bilateral lower extremity swelling.   With some chest pain sensation which has been going on over the past month.  Reports that he is compliant with his medication including diuretics which is scheduled for Mondays Wednesdays and Fridays. Also stating some burning sensation, and discomfort when making urine- as it goes through the Foley catheter.    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  - Elevated BNP 526.5  - With bilateral lower extremity edema, CTA -reviewed mild bilateral pleural effusion -Initiating IV Lasix  40 mg twice daily with good output -Monitoring I's and O's, daily weight -Reviewing home medication: Resuming Entresto , statins -Per cardiology would plan to put on Lasix  at discharge daily - Close outpatient follow-up   Last Echo: 09/04/2024, EF 60-65%, normal LV function, mild concentric LVH    Atherosclerosis of native coronary artery of native heart without angina pectoris - History of coronary artery disease, with 2 stents -Continue Eliquis , continue statins,     Doubt urinary tract infection -Denies any symptoms of fever, chills, burning - Due for catheter change in the a.m. at urology  PAF (paroxysmal atrial fibrillation) (HCC) Will continue Eliquis , amiodarone ,   Hyperlipidemia Continue statins   Hypomagnesemia - Repleted  Benign hypertension BP remains stable, reviewing home medication resuming accordingly including Entresto ,  Stage 3b chronic kidney disease (CKD) (HCC) -Stable    Hypothyroidism, acquired Continue Synthroid    BPH (benign prostatic hyperplasia) - With chronic Foley catheter,-since July, catheter exchanged q. Monthly -To see urology tomorrow   Atypical chest pain - Brief transient atypical chest pain pain and needle sensation on the left side - No acute changes on EKG -Troponin 15, 16 - No further workup per cardiology       Medical Consultants:   Cardiology   Discharge Exam:   Vitals:   11/24/24 0455 11/24/24 0851  BP: 123/67 122/74  Pulse: 73 65  Resp: 17 18  Temp: 98.3 F (36.8 C) 97.9 F (36.6 C)  SpO2: 99% 96%   Vitals:   11/23/24 1948 11/24/24 0033 11/24/24 0455 11/24/24 0851  BP: (!) 135/93 126/73 123/67 122/74  Pulse: 66 61 73 65  Resp: 18 16 17 18   Temp: 98 F (36.7 C) 98.2 F (36.8 C) 98.3 F (36.8 C) 97.9 F (36.6 C)  TempSrc: Oral Oral Oral Oral  SpO2: 99% 97% 99% 96%  Weight:   88.5 kg   Height:        General exam: Appears calm and comfortable.  Feeling  better and asking to go home   The results of significant diagnostics from this hospitalization (including imaging, microbiology, ancillary and laboratory) are listed below for reference.     Procedures and Diagnostic Studies:   DG Chest 2 View Result Date: 11/23/2024 CLINICAL DATA:  SOB EXAM: CHEST - 2 VIEW COMPARISON:  October 28, 2024, July 18, 2022 FINDINGS: The cardiomediastinal silhouette is unchanged and enlarged in contour.Atherosclerotic calcifications. No pleural effusion. No pneumothorax. No acute pleuroparenchymal abnormality. Severe degenerative changes of the RIGHT shoulder. Visualized abdomen is unremarkable. Multilevel degenerative changes of the thoracic spine. IMPRESSION: No acute cardiopulmonary abnormality. Electronically Signed   By: Corean Salter M.D.   On: 11/23/2024 11:02     Labs:   Basic Metabolic Panel: Recent Labs  Lab 11/23/24 1012 11/24/24 0542  NA 140 140  K 3.1* 3.4*  CL 107 104  CO2 23 27  GLUCOSE 114* 95  BUN 22 16  CREATININE 1.71* 1.47*  CALCIUM  8.0* 8.3*  MG 1.3*  --   PHOS 2.3*  --    GFR Estimated Creatinine Clearance: 42.5 mL/min (A) (by C-G formula based on SCr of 1.47 mg/dL (H)). Liver Function Tests: No results for input(s): AST, ALT, ALKPHOS, BILITOT, PROT, ALBUMIN in the last 168 hours. No results for input(s): LIPASE, AMYLASE in the last 168 hours. No results for input(s): AMMONIA in the last 168 hours. Coagulation profile Recent Labs  Lab 11/23/24 1022 11/24/24 0542  INR 1.3* 1.4*    CBC: Recent Labs  Lab 11/23/24 1012  WBC 9.7  NEUTROABS 7.9*  HGB 13.3  HCT 40.2  MCV 106.9*  PLT 238   Cardiac Enzymes: No results for input(s): CKTOTAL, CKMB, CKMBINDEX, TROPONINI in the last 168 hours. BNP: Invalid input(s): POCBNP CBG: No results for input(s): GLUCAP in the last 168 hours. D-Dimer No results for input(s): DDIMER in the last 72 hours. Hgb A1c No results for input(s): HGBA1C in the last 72 hours. Lipid Profile No results for input(s): CHOL, HDL, LDLCALC, TRIG, CHOLHDL, LDLDIRECT in the last 72 hours. Thyroid  function studies No results for input(s): TSH, T4TOTAL, T3FREE, THYROIDAB in the last 72 hours.  Invalid input(s): FREET3 Anemia work up No results for input(s):  VITAMINB12, FOLATE, FERRITIN, TIBC, IRON, RETICCTPCT in the last 72 hours. Microbiology Recent Results (from the past 240 hours)  Resp panel by RT-PCR (RSV, Flu A&B, Covid) Anterior Nasal Swab     Status: None   Collection Time: 11/23/24 10:12 AM   Specimen: Anterior Nasal Swab  Result Value Ref Range Status   SARS Coronavirus 2 by RT PCR NEGATIVE NEGATIVE Final   Influenza A by PCR NEGATIVE NEGATIVE Final   Influenza B by PCR NEGATIVE NEGATIVE Final    Comment: (NOTE) The Xpert Xpress SARS-CoV-2/FLU/RSV plus assay is intended as an aid in the diagnosis of influenza from Nasopharyngeal swab specimens and should not be used as a sole basis for treatment. Nasal washings and aspirates are unacceptable for Xpert Xpress SARS-CoV-2/FLU/RSV testing.  Fact Sheet for Patients: bloggercourse.com  Fact Sheet for Healthcare Providers: seriousbroker.it  This test is not yet approved or cleared by the [place]  FDA and has been authorized for detection and/or diagnosis of SARS-CoV-2 by FDA under an Emergency Use Authorization (EUA). This EUA will remain in effect (meaning this test can be used) for the duration of the COVID-19 declaration under Section 564(b)(1) of the Act, 21 U.S.C. section 360bbb-3(b)(1), unless the authorization is terminated or revoked.     Resp Syncytial  Virus by PCR NEGATIVE NEGATIVE Final    Comment: (NOTE) Fact Sheet for Patients: bloggercourse.com  Fact Sheet for Healthcare Providers: seriousbroker.it  This test is not yet approved or cleared by the [place]  FDA and has been authorized for detection and/or diagnosis of SARS-CoV-2 by FDA under an Emergency Use Authorization (EUA). This EUA will remain in effect (meaning this test can be used) for the duration of the COVID-19 declaration under Section 564(b)(1) of the Act, 21 U.S.C. section  360bbb-3(b)(1), unless the authorization is terminated or revoked.  Performed at [facility], [address], [place], [place] [address]      Discharge Instructions:   Discharge Instructions     Diet - low sodium heart healthy   Complete by: As directed    Discharge instructions   Complete by: As directed    Keep appointment with urology for tomorrow for catheter change-discussed UA results with them.  With no fever no elevated white blood cell count I am not sure that you have a urinary tract infection -You also need close follow-up with cardiology for a BMP in a week and adjustment of your diuretics   Increase activity slowly   Complete by: As directed       Allergies as of 11/24/2024       Reactions   Hydrocodone  Nausea And Vomiting, Other (See Comments)   Dizziness and upset stomach   Oxycodone  Nausea And Vomiting, Nausea Only   Robaxin  [methocarbamol ] Nausea And Vomiting   Ultram  [tramadol ] Nausea And Vomiting        Medication List     TAKE these medications    amiodarone  200 MG tablet Commonly known as: PACERONE  TAKE 1 TABLET BY MOUTH ONCE DAILY MONDAY THROUGH FRIDAY   apixaban  2.5 MG Tabs tablet Commonly known as: ELIQUIS  Take 1 tablet (2.5 mg total) by mouth 2 (two) times daily.   Entresto  24-26 MG Generic drug: sacubitril -valsartan  TAKE 1 TABLET BY MOUTH TWICE DAILY   furosemide  20 MG tablet Commonly known as: LASIX  Take 1 tablet (20 mg total) by mouth daily. Start taking on: November 25, 2024 What changed: See the new instructions.   levothyroxine  175 MCG tablet Commonly known as: SYNTHROID  Take 175 mcg by mouth daily before breakfast.   Myrbetriq 50 MG Tb24 tablet Generic drug: mirabegron ER Take 50 mg by mouth daily.   potassium chloride  SA 20 MEQ tablet Commonly known as: KLOR-CON  M Take 1 tablet (20 mEq total) by mouth daily. What changed: when to take this   rosuvastatin  20 MG tablet Commonly known as: CRESTOR  Take 1  tablet (20 mg total) by mouth daily.   silodosin  4 MG Caps capsule Commonly known as: RAPAFLO  Take 1 capsule (4 mg total) by mouth daily.        Follow-up Information     [name], MD Follow up in 1 week(s).   Specialty: Family Medicine Contact information: [address] [place] [address] [phone]         Elmira Newman PARAS, MD. Schedule an appointment as soon as possible for a visit in 1 week(s).   Specialties: Cardiology, Radiology Why: BMP Contact information: [address] [place] [address] [phone]                  Time coordinating discharge: 45 min  Signed:  Harlene RAYMOND Bowl DO  [facility] 11/24/2024, 11:25 AM

## 2024-11-24 NOTE — Progress Notes (Signed)
°   11/24/24 1140  TOC Brief Assessment  Insurance and Status Reviewed  Patient has primary care physician Yes  Home environment has been reviewed home w/ spouse  Prior level of function: self/independent  Prior/Current Home Services No current home services  Social Drivers of Health Review SDOH reviewed no interventions necessary  Readmission risk has been reviewed Yes  Transition of care needs no transition of care needs at this time    Pt stable for transition home, no HH or DME needs noted.

## 2024-11-25 ENCOUNTER — Telehealth: Payer: Self-pay | Admitting: Cardiology

## 2024-11-25 MED ORDER — POTASSIUM CHLORIDE CRYS ER 20 MEQ PO TBCR: 20.0000 meq | EXTENDED_RELEASE_TABLET | Freq: Every day | ORAL | 0 refills | Status: AC

## 2024-11-25 NOTE — Telephone Encounter (Signed)
 Refill has already been sent

## 2024-11-25 NOTE — Telephone Encounter (Signed)
 I do not know.  I saw the patient in 07/2024, but patient was discharged yesterday from the hospital.  I did not see him during this hospitalization.  Discharge summary does not mention about BMET to be checked.  He has an appointment on 12/02/2024 for follow-up with [facility].  Can discuss then.  Thanks MJP  Thanks MJP

## 2024-11-25 NOTE — Telephone Encounter (Signed)
°*  STAT* If patient is at the pharmacy, call can be transferred to refill team.   1. Which medications need to be refilled? (please list name of each medication and dose if known) potassium chloride  SA (KLOR-CON  M) 20 MEQ tablet   2. Which pharmacy/location (including street and city if local pharmacy) is medication to be sent to?  [facility] - [place], [place] - [address] [address]    3. Do they need a 30 day or 90 day supply? 90

## 2024-11-25 NOTE — Telephone Encounter (Signed)
 ER notes says that he needs a BMT recheck. Can and order be put in for the patient. Please advise

## 2024-11-25 NOTE — Telephone Encounter (Signed)
 Left message for Thomas [name] informing her Dr. Elmira states patient can have labs drawn at appt scheduled on 12/22. Provided office number for callback if any questions.

## 2024-11-26 LAB — URINE CULTURE: Culture: 20000 — AB

## 2024-12-02 ENCOUNTER — Ambulatory Visit: Attending: Emergency Medicine | Admitting: Emergency Medicine

## 2024-12-02 ENCOUNTER — Telehealth: Payer: Self-pay | Admitting: Emergency Medicine

## 2024-12-02 ENCOUNTER — Encounter: Payer: Self-pay | Admitting: Emergency Medicine

## 2024-12-02 VITALS — BP 112/68 | HR 70 | Ht 72.0 in | Wt 196.0 lb

## 2024-12-02 DIAGNOSIS — I10 Essential (primary) hypertension: Secondary | ICD-10-CM | POA: Diagnosis not present

## 2024-12-02 DIAGNOSIS — I4819 Other persistent atrial fibrillation: Secondary | ICD-10-CM

## 2024-12-02 DIAGNOSIS — E785 Hyperlipidemia, unspecified: Secondary | ICD-10-CM | POA: Diagnosis not present

## 2024-12-02 DIAGNOSIS — I2510 Atherosclerotic heart disease of native coronary artery without angina pectoris: Secondary | ICD-10-CM

## 2024-12-02 DIAGNOSIS — I5032 Chronic diastolic (congestive) heart failure: Secondary | ICD-10-CM

## 2024-12-02 LAB — BASIC METABOLIC PANEL WITH GFR
BUN/Creatinine Ratio: 13 (ref 10–24)
BUN: 22 mg/dL (ref 8–27)
CO2: 24 mmol/L (ref 20–29)
Calcium: 9.7 mg/dL (ref 8.6–10.2)
Chloride: 105 mmol/L (ref 96–106)
Creatinine, Ser: 1.73 mg/dL — ABNORMAL HIGH (ref 0.76–1.27)
Glucose: 109 mg/dL — ABNORMAL HIGH (ref 70–99)
Potassium: 4.6 mmol/L (ref 3.5–5.2)
Sodium: 145 mmol/L — ABNORMAL HIGH (ref 134–144)
eGFR: 39 mL/min/1.73 — ABNORMAL LOW

## 2024-12-02 NOTE — Progress Notes (Signed)
 " Cardiology Office Note:    Date:  12/02/2024  ID:  Glenwood, Revoir Nov 12, 1941, MRN [id number] PCP: [name], MD  [facility] Providers Cardiologist:  Newman JINNY Lawrence, MD Electrophysiologist:  Danelle Birmingham, MD  Electrophysiology APP:  Aniceto Daphne CROME, NP       Patient Profile:       Chief Complaint: Hospital follow-up History of Present Illness:  JOSIP MEROLLA is a 83 y.o. male with visit-pertinent history of atrial fibrillation, frequent PVCs, CKD, CAD  Patient with history of prior PCI.  Most recent cardiac catheterization 10/2022 showed previously placed ostial LAD to mid LAD stent of unknown type is widely patent.  Otherwise minimal nonobstructive CAD.  Echocardiogram 08/2024 with LVEF 60 to 65%, no RWMA, mild LVH, RV function and size normal, mildly elevated PASP, left atrial size severely dilated, mild mitral valve regurgitation.  He was found to be in new onset paroxysmal atrial fibrillation on 01/2024 in the setting of influenza.  He underwent DCCV on 05/17/2024.  He was evaluated in the hospital by cardiology on 11/23/2024 as he was admitted with transient chest discomfort and shortness of breath.  His high-sensitivity troponins are negative.  Chest discomfort thought to be possibly palpitations from his atrial fibrillation.  He was in rate controlled atrial fibrillation.  His BNP was elevated at 526.5 and he was found to have bilateral lower extremity edema and CTA showing mild bilateral pleural effusion.  He was started on IV Lasix  with good output.  He was discharged on Lasix  daily at discharge.  Discussed the use of AI scribe software for clinical note transcription with the patient, who gave verbal consent to proceed.  History of Present Illness INDIGO CHADDOCK is an 83 year old male with heart failure who presents for follow-up after recent hospitalization for chest discomfort and shortness of breath.  Since discharge he feels much better with  no chest pain, shortness of breath, or leg swelling. His prior left leg swelling during hospitalization has resolved. He is taking Lasix  and potassium as prescribed.  He reports that he is currently cardiac unaware of his arrhythmia and at times any palpitations or episodes of tachycardia.  He mains adherent to his medication regimen.  He reports a diet that includes eggs, grits, and occasional fish.  He denies syncope, presyncope, lightheadedness, dizziness, orthopnea, PND    Review of systems:  Please see the history of present illness. All other systems are reviewed and otherwise negative.      Studies Reviewed:    EKG Interpretation Date/Time:  Monday December 02 2024 08:05:37 EST Ventricular Rate:  70 PR Interval:    QRS Duration:  154 QT Interval:  452 QTC Calculation: 488 R Axis:   -66  Text Interpretation: Atrial fibrillation Right bundle branch block Left anterior fascicular block Bifascicular block When compared with ECG of 23-Nov-2024 10:04, PREVIOUS ECG IS PRESENT Confirmed by Rana Dixon [phone]) on 12/02/2024 8:09:30 AM    Echocardiogram 09/04/2024  1. Left ventricular ejection fraction, by estimation, is 60 to 65%. Left  ventricular ejection fraction by 3D volume is 63 %. The left ventricle has  normal function. The left ventricle has no regional wall motion  abnormalities. There is mild concentric  left ventricular hypertrophy. Left ventricular diastolic parameters are  indeterminate. Elevated left ventricular end-diastolic pressure.   2. Right ventricular systolic function is normal. The right ventricular  size is normal. There is mildly elevated pulmonary artery systolic  pressure.  3. Left atrial size was severely dilated.   4. Right atrial size was mildly dilated.   5. The mitral valve is normal in structure. Mild mitral valve  regurgitation. No evidence of mitral stenosis.   6. The aortic valve is tricuspid. Aortic valve regurgitation is trivial.   No aortic stenosis is present.   7. The inferior vena cava is normal in size with greater than 50%  respiratory variability, suggesting right atrial pressure of 3 mmHg.   Carotid duplex 11/30/2022 Right Carotid: Velocities in the right ICA are consistent with a 1-39%  stenosis.   Left Carotid: Velocities in the left ICA are consistent with a 1-39%  stenosis.               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   Vertebrals:  Bilateral vertebral arteries demonstrate antegrade flow.  Subclavians: Normal flow hemodynamics were seen in bilateral subclavian               arteries.   Cardiac catheterization 10/26/2022   Previously placed Ost LAD to Mid LAD stent of unknown type is  widely patent-> Ost Cx lesion is 20% stenosed with no flow limitations .   Otherwise minimal-nonobstructive CAD.   --------------------------------------------------------   Right Heart: PAP-mean 33/14-18 mmHg. PCWP 16/14-12 mmHg & LVEDP 12 mmHg (Well Compensated)   Right Heart: Somewhat conflicting cardiac Output-Index: Fick 6.34-3.12; Thermal 4.17-2.05 (consider possibility of valvular disease) Diagnostic Dominance: Right  Zio 11/11/2019 1. NSR with frequent PVCs and PAC's 2. NSVT 3. NS AT 4. No prolonged pauses 5. PVC burden over 35% with multifocal PVC's  Risk Assessment/Calculations:    CHA2DS2-VASc Score = 4   This indicates a 4.8% annual risk of stroke. The patient's score is based upon: CHF History: 1 HTN History: 0 Diabetes History: 0 Stroke History: 0 Vascular Disease History: 1 Age Score: 2 Gender Score: 0             Physical Exam:   VS:  BP 112/68 (BP Location: Left Arm, Patient Position: Sitting, Cuff Size: Normal)   Pulse 70   Ht 6' (1.829 m)   Wt 196 lb (88.9 kg)   BMI 26.58 kg/m    Wt Readings from Last 3 Encounters:  12/02/24 196 lb (88.9 kg)  11/24/24 195 lb 3.2 oz (88.5 kg)  07/29/24 189 lb 9.6 oz (86 kg)    GEN: Well nourished, well developed in no acute  distress NECK: No JVD; No carotid bruits CARDIAC: Irregularly irregular rhythm, no murmurs, rubs, gallops RESPIRATORY:  Clear to auscultation without rales, wheezing or rhonchi  ABDOMEN: Soft, non-tender, non-distended EXTREMITIES:  No edema; No acute deformity      Assessment and Plan:  Persistent atrial fibrillation New onset 01/2024 in the setting of influenza S/p DCCV on 05/2024 - Rate controlled today at 70 bpm - Continue amiodarone  200 mg M-F - Continue Eliquis  2.5 mg twice daily, no bleeding concerns - Scheduled to see EP on 12/29 - Will recheck BMET today as last creatinine was 1.47 and if remains below 1.5 we will need to increase Eliquis  back to 5 mg twice daily  Coronary artery disease S/p prior PTCA and LAD PCI with LHC in 2023 showing patent stents and otherwise minimal nonobstructive - Today he is stable without chest pains or anginal symptoms.  No indication for further ischemic evaluation at this time - No ASA due to DOAC - Continue rosuvastatin  20 mg daily  HFpEF Echo 08/2024 with LVEF 60 to 65%, no RWMA,  RV normal Admitted 11/2024 with acute on chronic CHF with elevated BNP of 526 and CTA showing mild bilateral pleural effusion and was appropriately IV diuresed - Today he appears euvolemic and well compensated on exam.  Notes resolution of his shortness of breath and LEE.  Denies orthopnea, PND, weight gain - No changes to current GDMT - Continue furosemide  20 mg daily and Entresto  24-26 mg twice daily - BMET today  Hypertension Blood pressure is well-controlled at 112/68 - Continue Entresto  24-26 mg twice daily  Hyperlipidemia LDL 78 on 07/2024 - Continue rosuvastatin  20 mg daily        Dispo:  Return in about 3 months (around 03/02/2025).  Signed, Lum LITTIE Louis, NP  "

## 2024-12-02 NOTE — Patient Instructions (Signed)
 Medication Instructions:  NO CHANGES  Lab Work: BMET TO BE DONE TODAY.  Testing/Procedures: NONE  Follow-Up: At [facility], you and your health needs are our priority.  As part of our continuing mission to provide you with exceptional heart care, our providers are all part of one team.  This team includes your primary Cardiologist (physician) and Advanced Practice Providers or APPs (Physician Assistants and Nurse Practitioners) who all work together to provide you with the care you need, when you need it.  Your next appointment:   3 MONTHS  Provider:   Newman JINNY Lawrence, MD

## 2024-12-02 NOTE — Telephone Encounter (Signed)
 Pt daughter called in and pt was supposed to discuss furosemide  (LASIX ) 20 MG tablet bill on if he should continue taking everyday or Mon, Wed, Friday. Please advise.

## 2024-12-02 NOTE — Telephone Encounter (Signed)
 Spoke to pt on the phone and clarified that he should take his Lasix  20mg  daily. Pt asked about his other medications and I informed him the only medication he takes M-F only is Amiodarone . Pt verbalizes understanding.

## 2024-12-03 ENCOUNTER — Ambulatory Visit: Payer: Self-pay | Admitting: Emergency Medicine

## 2024-12-06 NOTE — Progress Notes (Deleted)
" °  Electrophysiology Office Note:   Date:  12/06/2024  ID:  Thomas [name] 12/09/1941, MRN [id number]  Primary Cardiologist: Newman JINNY Lawrence, MD Primary Heart Failure: None Electrophysiologist: Danelle Birmingham, MD  {Click to update primary MD,subspecialty MD or APP then REFRESH:1}    History of Present Illness:   Thomas [name] is a 83 y.o. male with h/o PVC's, AF (new dx 01/2024 in setting of influenza), HLD, CAD  seen today for routine electrophysiology followup.   Admit 12/14-12/15/25 for increased shortness of breath.  Felt to have mild exacerbation of HF and was diuresed.   Since last being seen in our clinic the patient reports doing ***.   He*** denies chest pain, palpitations, dyspnea, PND, orthopnea, nausea, vomiting, dizziness, syncope, edema, weight gain, or early satiety.   Review of systems complete and found to be negative unless listed in HPI.   EP Information / Studies Reviewed:    EKG is ordered today. Personal review as below.       Arrhythmia / AAD / Pertinent EP Studies PVC's AF  DCCV 05/17/24 > 200j x1 with conversion to NSR Amiodarone  2022 >     Risk Assessment/Calculations:    CHA2DS2-VASc Score = 4  {Confirm score is correct.  If not, click here to update score.  REFRESH note.  :1} This indicates a 4.8% annual risk of stroke. The patient's score is based upon: CHF History: 1 HTN History: 0 Diabetes History: 0 Stroke History: 0 Vascular Disease History: 1 Age Score: 2 Gender Score: 0   {This patient has a significant risk of stroke if diagnosed with atrial fibrillation.  Please consider VKA or DOAC agent for anticoagulation if the bleeding risk is acceptable.   You can also use the SmartPhrase .[email or web address] for documentation.   :[id number]} No BP recorded.  {Refresh Note OR Click here to enter BP  :1}***        Physical Exam:   VS:  There were no vitals taken for this visit.   Wt Readings from Last 3 Encounters:  12/02/24 196 lb (88.9  kg)  11/24/24 195 lb 3.2 oz (88.5 kg)  07/29/24 189 lb 9.6 oz (86 kg)     GEN: Well nourished, well developed in no acute distress NECK: No JVD; No carotid bruits CARDIAC: {EPRHYTHM:[id number]}, no murmurs, rubs, gallops RESPIRATORY:  Clear to auscultation without rales, wheezing or rhonchi  ABDOMEN: Soft, non-tender, non-distended EXTREMITIES:  No edema; No deformity   ASSESSMENT AND PLAN:    Persistent Atrial Fibrillation  High Risk Medication Monitoring: Amiodarone   Onset 01/2024 in setting of flu -EKG with ***, stable intervals on amiodarone   -OAC for stroke prophylaxis  -continue amiodarone  200 mg daily (M-F)  -update amio labs > LFT's, TSH/free T4  -DCCV?  Secondary Hypercoagulable State  -continue Eliquis  2.5mg  BID, dose reviewed and appropriate by age / Cr (1.74)  -no bleeding issues ***   HFpEF  CAD PTCA and LAD PCI with LHC in 2023 showing patent stents & otherwise minimal nonobstructive, LVEF 60-65% 08/2024  -per Cardiology   BPH Chronic Indwelling Foley -per Urology    Follow up with Dr. Nancey {EPFOLLOW LE:[id number]} > transition to new EP MD reviewed with patient ***  Signed, Daphne Barrack, NP-C, AGACNP-BC [facility] - Electrophysiology  12/06/2024, 7:40 AM  "

## 2024-12-09 ENCOUNTER — Encounter: Payer: Self-pay | Admitting: Pulmonary Disease

## 2024-12-09 ENCOUNTER — Ambulatory Visit: Attending: Pulmonary Disease | Admitting: Cardiology

## 2024-12-09 VITALS — BP 114/78 | HR 80 | Ht 72.0 in | Wt 196.8 lb

## 2024-12-09 DIAGNOSIS — D6869 Other thrombophilia: Secondary | ICD-10-CM

## 2024-12-09 DIAGNOSIS — Z79899 Other long term (current) drug therapy: Secondary | ICD-10-CM | POA: Diagnosis not present

## 2024-12-09 DIAGNOSIS — I480 Paroxysmal atrial fibrillation: Secondary | ICD-10-CM | POA: Diagnosis not present

## 2024-12-09 DIAGNOSIS — I5032 Chronic diastolic (congestive) heart failure: Secondary | ICD-10-CM

## 2024-12-09 LAB — HEPATIC FUNCTION PANEL
ALT: 9 IU/L (ref 0–44)
AST: 17 IU/L (ref 0–40)
Albumin: 4.1 g/dL (ref 3.7–4.7)
Alkaline Phosphatase: 64 IU/L (ref 48–129)
Bilirubin Total: 0.8 mg/dL (ref 0.0–1.2)
Bilirubin, Direct: 0.29 mg/dL (ref 0.00–0.40)
Total Protein: 6.6 g/dL (ref 6.0–8.5)

## 2024-12-09 LAB — T4, FREE: Free T4: 2.99 ng/dL — ABNORMAL HIGH (ref 0.82–1.77)

## 2024-12-09 LAB — TSH: TSH: 0.499 u[IU]/mL (ref 0.450–4.500)

## 2024-12-09 NOTE — Patient Instructions (Signed)
 Medication Instructions:  None  *If you need a refill on your cardiac medications before your next appointment, please call your pharmacy*  Lab Work: Today: LFTs Free T4, TSH If you have labs (blood work) drawn today and your tests are completely normal, you will receive your results only by: MyChart Message (if you have MyChart) OR A paper copy in the mail If you have any lab test that is abnormal or we need to change your treatment, we will call you to review the results.  Testing/Procedures: None   Follow-Up: At [facility] , you and your health needs are our priority.  As part of our continuing mission to provide you with exceptional heart care, our providers are all part of one team.  This team includes your primary Cardiologist (physician) and Advanced Practice Providers or APPs (Physician Assistants and Nurse Practitioners) who all work together to provide you with the care you need, when you need it.  Your next appointment:   4 month(s)  Provider:   You will see one of the following Advanced Practice Providers on your designated Care Team:     Daphne Barrack, NP    Other Instructions

## 2024-12-09 NOTE — Progress Notes (Signed)
 " Electrophysiology Office Note:   Date:  12/09/2024  ID:  Thomas [name], Chill 1941-10-06, MRN [id number]  Primary Cardiologist: Newman JINNY Lawrence, MD Electrophysiologist: Danelle Birmingham, MD   Electrophysiologist:  Danelle Birmingham, MD  Electrophysiology APP:  Aniceto Daphne CROME, NP      History of Present Illness:   Thomas [name] is a 83 y.o. 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   Patient recently admitted to the hospital with intermittent chest pain and dyspnea. ACS workup negative. He was noted to be in afib (rate controlled) and BNP elevated with bilateral LE edema. Chest imaging also with mild bilateral pleural effusions. He was given IV lasix  in the hospital and discharged on daily PO lasix  regimen. At follow up with general cardiology on 12/02/24, patient reported feeling much better with resolution of dyspnea and chest discomfort.   Since last being seen in our clinic the patient reports doing well, feels back to normal post admission, back to walking and physical activity.  he denies chest pain, palpitations, dyspnea, PND, orthopnea, nausea, vomiting, dizziness, syncope, edema, weight gain, or early satiety.   Review of systems complete and found to be negative unless listed in HPI.   EP Information / Studies Reviewed:    EKG is ordered today. Personal review as below.  EKG Interpretation Date/Time:  Monday December 09 2024 08:12:46 EST Ventricular Rate:  80 PR Interval:    QRS Duration:  156 QT Interval:  440 QTC Calculation: 507 R Axis:   -68  Text Interpretation: Atrial fibrillation vs atrial flutter Right bundle branch block Left anterior fascicular block Bifascicular block When compared with ECG of 02-Dec-2024 08:05, No significant change was found Confirmed by Trudy Birmingham [phone]) on 12/09/2024 8:49:08 AM    Arrhythmia/Device History No specialty comments  available.   Physical Exam:   VS:  BP 114/78   Pulse 80   Ht 6' (1.829 m)   Wt 196 lb 12.8 oz (89.3 kg)   SpO2 94%   BMI 26.69 kg/m    Wt Readings from Last 3 Encounters:  12/09/24 196 lb 12.8 oz (89.3 kg)  12/02/24 196 lb (88.9 kg)  11/24/24 195 lb 3.2 oz (88.5 kg)     GEN: No acute distress NECK: No JVD; No carotid bruits CARDIAC: Irregularly irregular rate and rhythm, no rubs, gallops. Faint systolic murmur over apex. 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 Initially paroxysmal, NSR restored with DCCV. Recurrent afib seen during recent admission. ECG today with afib vs flutter, rate controlled. Patient asymptomatic. Discussed repeat DCCV to restore NSR, patient politely declines. With no plans to restore NSR, will continue Amiodarone  for PVC management, not to maintain NSR. Continue reduced dose Eliquis  2.5mg  BID. Check TSH and LFTs today.   PVCs Patient with hx symptomatic PVCs, started on Amiodarone  in 2022 with notable improvement. Remains symptom free. Continue Amiodarone  200mg  M-F. As above, update TSH and LFTs.  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 Euvolemic appearing today. Continue GDMT per general cardiology.   CAD Hyperlipidemia S/P prior stenting to LAD. No anginal symptoms reported. Continue Crestor  per general cardiology.   Hypertension Blood pressure is well-controlled on  current antihypertensive regimen. Continue current medications and dosing.        Follow up with EP Team 4 months.   Signed, Artist Pouch, PA-C  "

## 2024-12-10 ENCOUNTER — Ambulatory Visit: Payer: Self-pay | Admitting: Cardiology

## 2024-12-17 ENCOUNTER — Encounter: Payer: Self-pay | Admitting: General Practice

## 2024-12-17 ENCOUNTER — Ambulatory Visit (INDEPENDENT_AMBULATORY_CARE_PROVIDER_SITE_OTHER): Admitting: General Practice

## 2024-12-17 VITALS — BP 120/80 | HR 91 | Temp 97.4°F | Ht 72.0 in | Wt 193.6 lb

## 2024-12-17 DIAGNOSIS — R918 Other nonspecific abnormal finding of lung field: Secondary | ICD-10-CM | POA: Diagnosis not present

## 2024-12-17 DIAGNOSIS — E039 Hypothyroidism, unspecified: Secondary | ICD-10-CM

## 2024-12-17 DIAGNOSIS — E782 Mixed hyperlipidemia: Secondary | ICD-10-CM

## 2024-12-17 DIAGNOSIS — I5043 Acute on chronic combined systolic (congestive) and diastolic (congestive) heart failure: Secondary | ICD-10-CM

## 2024-12-17 DIAGNOSIS — I25118 Atherosclerotic heart disease of native coronary artery with other forms of angina pectoris: Secondary | ICD-10-CM

## 2024-12-17 DIAGNOSIS — I493 Ventricular premature depolarization: Secondary | ICD-10-CM

## 2024-12-17 DIAGNOSIS — M160 Bilateral primary osteoarthritis of hip: Secondary | ICD-10-CM | POA: Diagnosis not present

## 2024-12-17 DIAGNOSIS — Z7689 Persons encountering health services in other specified circumstances: Secondary | ICD-10-CM | POA: Insufficient documentation

## 2024-12-17 DIAGNOSIS — Z978 Presence of other specified devices: Secondary | ICD-10-CM

## 2024-12-17 DIAGNOSIS — R051 Acute cough: Secondary | ICD-10-CM | POA: Diagnosis not present

## 2024-12-17 DIAGNOSIS — I10 Essential (primary) hypertension: Secondary | ICD-10-CM

## 2024-12-17 DIAGNOSIS — I480 Paroxysmal atrial fibrillation: Secondary | ICD-10-CM

## 2024-12-17 DIAGNOSIS — N1832 Chronic kidney disease, stage 3b: Secondary | ICD-10-CM

## 2024-12-17 MED ORDER — BENZONATATE 200 MG PO CAPS: 200.0000 mg | ORAL_CAPSULE | Freq: Three times a day (TID) | ORAL | 0 refills | Status: DC | PRN

## 2024-12-17 NOTE — Assessment & Plan Note (Signed)
 EMR reviewed briefly.

## 2024-12-17 NOTE — Assessment & Plan Note (Addendum)
 Improved and resolved. Had bilateral hip replacement.  Follows with ortho at [facility] orthopedic.

## 2024-12-17 NOTE — Patient Instructions (Addendum)
 Start Benzonatate  capsules for cough. Take 1 capsule by mouth three times daily as needed for cough.   You can try a few things over the counter to help with your symptoms including:  Cough: Delsym or Robitussin (get the off brand, works just as well) Chest Congestion: Mucinex (plain) Nasal Congestion/Ear Pressure/Sinus Pressure: Try using Flonase (fluticasone) nasal spray. Instill 1 spray in each nostril twice daily. This can be purchased over the counter. Body aches, fevers, headache: Ibuprofen (not to exceed 2400 mg in 24 hours) or Acetaminophen -Tylenol  (not to exceed 3000 mg in 24 hours) Runny Nose/Throat Drainage/Sneezing/Itchy or Watery Eyes: An antihistamine such as Zyrtec, Claritin , Xyzal, Allegra  You should be feeling better by day seven of symptoms, but please do schedule an appointment if this is not the case.   Rest, use humidifier.  Let me know when you need refills.  Bring all of your bottles to the next appointment.   Follow up in 4 weeks for physical and fasting labs.   It was a pleasure to meet you today! Please don't hesitate to contact me with any questions. Welcome to [facility]!

## 2024-12-17 NOTE — Progress Notes (Signed)
 "  New Patient Office Visit  Subjective    Patient ID: Thomas [name], male    DOB: 1941/12/09  Age: 84 y.o. MRN: [id number]  CC:  Chief Complaint  Patient presents with   New Patient (Initial Visit)    Establish care   Cough    With runny nose and cough is dry. Patient sx started about 1-2 weeks. Patient has been using nasal spray for his sx.     HPI Thomas [name] is a 84 y.o. male presents to establish care.  Previous pcp/physical/labs: Dr. Tanda Eke  Discussed the use of AI scribe software for clinical note transcription with the patient, who gave verbal consent to proceed.  History of Present Illness Thomas [name] is an 84 year old male with coronary artery disease, atrial fibrillation, hypertension, and congestive heart failure who presents with a cough.  He has experienced a cough for approximately two weeks, accompanied by rhinorrhea. He used a nasal spray from [employer] brand, which caused dizziness, leading to its discontinuation. No fever, chills, chest pain, shortness of breath, difficulty breathing, nausea, vomiting, or diarrhea.  He has a significant cardiac history, including coronary artery disease, atrial fibrillation, hypertension, and congestive heart failure. He is under the care of cardiologists and takes amiodarone  200 mg Monday through Friday, Eliquis  2.5 mg twice daily, Lasix  (furosemide ) once daily, Entresto  twice daily, Crestor , and potassium supplements. He also takes levothyroxine  for thyroid  management and has been advised to limit his fluid intake due to heart failure.  He has a Foley catheter due to urinary retention related to prostate issues and is under the care of a urologist. He reports a history of kidney issues and takes Myrbetriq for bladder management. He experiences occasional dysuria, which he attributes to air passing through the catheter.  He has a history of lung nodules, which were stable or improved on a CT scan in February 2024.  He quit smoking 16 years ago.   He underwent bilateral hip replacements and reports that his osteoarthritis symptoms have improved since the surgeries.     Outpatient Encounter Medications as of 12/17/2024  Medication Sig   amiodarone  (PACERONE ) 200 MG tablet TAKE 1 TABLET BY MOUTH ONCE DAILY MONDAY THROUGH FRIDAY   apixaban  (ELIQUIS ) 2.5 MG TABS tablet Take 1 tablet (2.5 mg total) by mouth 2 (two) times daily.   benzonatate  (TESSALON ) 200 MG capsule Take 1 capsule (200 mg total) by mouth 3 (three) times daily as needed.   furosemide  (LASIX ) 20 MG tablet Take 1 tablet (20 mg total) by mouth daily.   levothyroxine  (SYNTHROID ) 175 MCG tablet Take 175 mcg by mouth daily before breakfast.   MYRBETRIQ 50 MG TB24 tablet Take 50 mg by mouth daily.   potassium chloride  SA (KLOR-CON  M) 20 MEQ tablet Take 1 tablet (20 mEq total) by mouth daily.   rosuvastatin  (CRESTOR ) 20 MG tablet Take 1 tablet (20 mg total) by mouth daily.   sacubitril -valsartan  (ENTRESTO ) 24-26 MG TAKE 1 TABLET BY MOUTH TWICE DAILY   No facility-administered encounter medications on file as of 12/17/2024.    Past Medical History:  Diagnosis Date   Arthritis    Bifascicular bundle branch block    Bigeminy 06/2019   CKD (chronic kidney disease) stage 3, GFR 30-59 ml/min (HCC) 10/26/2022   Coronary artery disease    takes Plavix  and ASA daily   GERD (gastroesophageal reflux disease)    occasional and will take Tums if needed   History of blood clots  2008   in chest   History of kidney stones late 90's   Hyperlipidemia    takes Simvastatin  daily   Hypertension    takes Maxzide  daily   Hypothyroidism    takes Synthroid  daily   Joint pain    Joint swelling    left knee   New onset atrial fibrillation (HCC) 02/04/2024   Pneumonia    at 84 years old, double    Past Surgical History:  Procedure Laterality Date   ARTERY BIOPSY Left 12/15/2021   Procedure: LEFT TEMPORAL ARTERY BIOPSY;  Surgeon: Magda Debby SAILOR, MD;   Location: [facility] OR;  Service: Vascular;  Laterality: Left;   CARDIAC CATHETERIZATION     CARDIOVERSION N/A 05/17/2024   Procedure: CARDIOVERSION;  Surgeon: Jeffrie Oneil BROCKS, MD;  Location: [facility] INVASIVE CV LAB;  Service: Cardiovascular;  Laterality: N/A;   CHOLECYSTECTOMY N/A 06/06/2018   Procedure: LAPAROSCOPIC CHOLECYSTECTOMY WITH INTRAOPERATIVE CHOLANGIOGRAM;  Surgeon: Belinda Cough, MD;  Location: [facility] OR;  Service: General;  Laterality: N/A;   CORONARY ANGIOPLASTY  05/21/2007   lad stent/taxus stent   CYSTOSCOPY WITH RETROGRADE PYELOGRAM, URETEROSCOPY AND STENT PLACEMENT Right 03/29/2022   Procedure: CYSTOSCOPY WITH RETROGRADE PYELOGRAM AND STENT PLACEMENT;  Surgeon: Carolee Sherwood JONETTA DOUGLAS, MD;  Location: [facility] ORS;  Service: Urology;  Laterality: Right;   CYSTOSCOPY/URETEROSCOPY/HOLMIUM LASER/STENT PLACEMENT Right 04/18/2022   Procedure: CYSTOSCOPY\ RIGHT URETEROSCOPY/STONE BASKET EXTRACTION/STENT PLACEMENT;  Surgeon: Carolee Sherwood JONETTA DOUGLAS, MD;  Location: [facility] ORS;  Service: Urology;  Laterality: Right;  60 MINS FOR CASE   HEMORROIDECTOMY  late 1970's   HERNIA REPAIR Right 1980's   IR CYSTOSTOMY TUBE PLACEMENT/BLADDER ASPIRATION  07/09/2024   RIGHT/LEFT HEART CATH AND CORONARY ANGIOGRAPHY N/A 10/26/2022   Procedure: RIGHT/LEFT HEART CATH AND CORONARY ANGIOGRAPHY;  Surgeon: Anner Alm ORN, MD;  Location: [facility] INVASIVE CV LAB;  Service: Cardiovascular;  Laterality: N/A;   TOTAL HIP ARTHROPLASTY Right 10/28/2013   Procedure: RIGHT TOTAL HIP ARTHROPLASTY ANTERIOR APPROACH;  Surgeon: Dempsey LULLA Moan, MD;  Location: [facility] ORS;  Service: Orthopedics;  Laterality: Right;   TOTAL HIP ARTHROPLASTY Left 05/08/2015   Procedure: LEFT TOTAL HIP ARTHROPLASTY ANTERIOR APPROACH;  Surgeon: Dempsey Moan, MD;  Location: [facility] OR;  Service: Orthopedics;  Laterality: Left;    Family History  Problem Relation Age of Onset   Heart disease Mother    CAD Mother    Heart failure Mother    Heart attack Mother    Pneumonia Father    Heart  failure Father    Heart disease Brother    Cancer Brother    Cancer Brother    Early death Daughter    Cancer Daughter     Social History   Socioeconomic History   Marital status: Married    Spouse name: Apolinar   Number of children: 2   Years of education: Not on file   Highest education level: High school graduate  Occupational History   Not on file  Tobacco Use   Smoking status: Former    Current packs/day: 0.00    Average packs/day: 0.3 packs/day for 52.0 years (13.0 ttl pk-yrs)    Types: Cigarettes    Start date: 1964    Quit date: 2016    Years since quitting: 10.0   Smokeless tobacco: Never   Tobacco comments:    trying to quit  Vaping Use   Vaping status: Never Used  Substance and Sexual Activity   Alcohol use: Not Currently   Drug use: No   Sexual  activity: Not Currently  Other Topics Concern   Not on file  Social History Narrative   Not on file   Social Drivers of Health   Tobacco Use: Medium Risk (12/17/2024)   Patient History    Smoking Tobacco Use: Former    Smokeless Tobacco Use: Never    Passive Exposure: Not on Actuary Strain: Not on file  Food Insecurity: No Food Insecurity (11/23/2024)   Epic    Worried About Programme Researcher, Broadcasting/film/video in the Last Year: Never true    Ran Out of Food in the Last Year: Never true  Transportation Needs: No Transportation Needs (11/23/2024)   Epic    Lack of Transportation (Medical): No    Lack of Transportation (Non-Medical): No  Physical Activity: Not on file  Stress: Not on file  Social Connections: Moderately Integrated (11/23/2024)   Social Connection and Isolation Panel    Frequency of Communication with Friends and Family: More than three times a week    Frequency of Social Gatherings with Friends and Family: Once a week    Attends Religious Services: More than 4 times per year    Active Member of [employer] or Organizations: No    Attends Banker Meetings: Never    Marital Status:  Married  Catering Manager Violence: Not At Risk (11/23/2024)   Epic    Fear of Current or Ex-Partner: No    Emotionally Abused: No    Physically Abused: No    Sexually Abused: No  Depression (PHQ2-9): Low Risk (12/17/2024)   Depression (PHQ2-9)    PHQ-2 Score: 0  Alcohol Screen: Not on file  Housing: Low Risk (11/23/2024)   Epic    Unable to Pay for Housing in the Last Year: No    Number of Times Moved in the Last Year: 0    Homeless in the Last Year: No  Utilities: Not At Risk (11/23/2024)   Epic    Threatened with loss of utilities: No  Health Literacy: Not on file    Review of Systems  Constitutional:  Negative for chills and fever.  HENT:  Positive for congestion. Negative for ear pain, nosebleeds and sore throat.        Runny nose.  Respiratory:  Positive for cough. Negative for shortness of breath.   Cardiovascular:  Negative for chest pain.  Gastrointestinal:  Negative for abdominal pain, constipation, diarrhea, heartburn, nausea and vomiting.  Genitourinary:  Negative for dysuria, frequency and urgency.  Neurological:  Negative for dizziness and headaches.  Endo/Heme/Allergies:  Negative for polydipsia.  Psychiatric/Behavioral:  Negative for depression and suicidal ideas. The patient is not nervous/anxious.         Objective    BP 120/80   Pulse 91   Temp (!) 97.4 F (36.3 C) (Temporal)   Ht 6' (1.829 m)   Wt 193 lb 9.6 oz (87.8 kg)   SpO2 97%   BMI 26.26 kg/m   Physical Exam Vitals and nursing note reviewed.  Constitutional:      Appearance: Normal appearance.  HENT:     Right Ear: Tympanic membrane, ear canal and external ear normal.     Left Ear: Tympanic membrane and ear canal normal.     Nose: Rhinorrhea present.     Mouth/Throat:     Mouth: Mucous membranes are moist.     Pharynx: Oropharynx is clear.  Eyes:     Conjunctiva/sclera: Conjunctivae normal.  Cardiovascular:     Rate and Rhythm:  Normal rate and regular rhythm.     Pulses: Normal  pulses.     Heart sounds: Normal heart sounds.  Pulmonary:     Effort: Pulmonary effort is normal.     Breath sounds: Normal breath sounds.  Genitourinary:    Comments: Chronic foley catheter  Neurological:     Mental Status: He is alert and oriented to person, place, and time.  Psychiatric:        Mood and Affect: Mood normal.        Behavior: Behavior normal.        Thought Content: Thought content normal.        Judgment: Judgment normal.         Assessment & Plan:  Acute cough -     Benzonatate ; Take 1 capsule (200 mg total) by mouth 3 (three) times daily as needed.  Dispense: 20 capsule; Refill: 0  Establishing care with new doctor, encounter for Assessment & Plan: EMR reviewed briefly.     Coronary artery disease of native artery of native heart with stable angina pectoris  PVC's (premature ventricular contractions)  PAF (paroxysmal atrial fibrillation) (HCC)  Benign hypertension  Acute on chronic combined systolic and diastolic CHF (congestive heart failure) (HCC)  Pulmonary nodules  Hypothyroidism, acquired  Osteoarthritis of both hips, unspecified osteoarthritis type Assessment & Plan: Improved and resolved. Had bilateral hip replacement.  Follows with ortho at [facility] orthopedic.   Stage 3b chronic kidney disease (CKD) (HCC)  Chronic indwelling Foley catheter  Mixed hyperlipidemia    Assessment and Plan Assessment & Plan Acute cough Cough with rhinorrhea, no fever or dyspnea.  - Prescribed Tessalon  Perles 200 mg TID for cough. - Recommended Zyrtec or Claritin  for rhinorrhea. - Advised humidifier use. - Follow-up in four weeks for reassessment.  Chronic combined systolic and diastolic heart failure Chronic heart failure managed with Lasix  and Entresto . Recent Lasix  adjustment post-hospitalization. - Continue Lasix  20 mg once daily per cardiologist. - Reviewed cardiology notes.  - Ensure cardiologist follow-up for heart failure  management.  Coronary artery disease with stable angina Coronary artery disease managed by cardiologist. - Continue current cardiac medications as prescribed by cardiologist.  Paroxysmal atrial fibrillation Managed with amiodarone  and Eliquis . - Continue amiodarone  200 mg Monday through Friday. - Continue Eliquis  2.5 mg twice daily.  Benign hypertension Hypertension managed by cardiologist.  Stage 3b chronic kidney disease Advised against excessive water  intake. 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 - Continue management with urologist. - Ensure regular catheter care and follow-up with urologist.  Acquired hypothyroidism Managed with levothyroxine . - Continue levothyroxine  as prescribed. - reviewed labs in care everywhere.  - will repeat TSH T4 next visit.   Mixed hyperlipidemia Managed with Crestor . - Continue Crestor  as prescribed.   Return in about 4 weeks (around 01/14/2025) for physical  and same day fasting labs.SABRA Carrol [facility], NP  "

## 2024-12-26 ENCOUNTER — Encounter: Payer: Self-pay | Admitting: General Practice

## 2024-12-26 DIAGNOSIS — Z978 Presence of other specified devices: Secondary | ICD-10-CM

## 2024-12-26 DIAGNOSIS — N401 Enlarged prostate with lower urinary tract symptoms: Secondary | ICD-10-CM

## 2025-01-01 ENCOUNTER — Ambulatory Visit: Payer: Self-pay

## 2025-01-01 NOTE — Telephone Encounter (Signed)
 FYI Only or Action Required?: FYI only for provider: ED advised.  Patient was last seen in primary care on 12/17/2024 by Vincente Shivers, NP.  Called Nurse Triage reporting Shortness of Breath.  Symptoms began several days ago.  Interventions attempted: Nothing.  Symptoms are: gradually worsening.  Triage Disposition: Go to ED Now (Notify PCP)  Patient/caregiver understands and will follow disposition?: Yes

## 2025-01-01 NOTE — Telephone Encounter (Signed)
 Agree with disposition.

## 2025-01-01 NOTE — Telephone Encounter (Signed)
" °  Summary: chest tightens when breathing   Reason for Triage: bad cough pills makes his head swim doesn't feel good wheezing a little chest tightens when breathing    Reason for Disposition  [1] MODERATE difficulty breathing (e.g., speaks in phrases, SOB even at rest, pulse 100-120) AND [2] NEW-onset or WORSE than normal  Answer Assessment - Initial Assessment Questions Pt was seen 1.6.26 and was given benzonate pills but he didn't like the way they made him feel dizzy. He tried delsym and that also made him feel dizzy. Dtr states he is having some pretty significant coughing fits and he is complaining of chest tightness and shortness of breath. She asked if he was having this at rest or only with movement. He stated he does feel it right now, short of breath. He also states he gets dizzy with standing and feels more short of breath and the chest tightness with movement. Ox 96%, HR 101. Given shortness of breath at rest and HR over 100 and chest tightness RN recommended ER. Pt's dtr stated understanding.  Due to ED dispo, not all triage questions answered    1. RESPIRATORY STATUS: Describe your breathing? (e.g., wheezing, shortness of breath, unable to speak, severe coughing)      Shortness of breath, severe coughing 2. ONSET: When did this breathing problem begin?      Prior to 1.6.26 appt, just getting worse 3. PATTERN Does the difficult breathing come and go, or has it been constant since it started?       4. SEVERITY: How bad is your breathing? (e.g., mild, moderate, severe)      Moderate  5. RECURRENT SYMPTOM: Have you had difficulty breathing before? If Yes, ask: When was the last time? and What happened that time?       6. CARDIAC HISTORY: Do you have any history of heart disease? (e.g., heart attack, angina, bypass surgery, angioplasty)      CAD, Afib, CHF 7. LUNG HISTORY: Do you have any history of lung disease?  (e.g., pulmonary embolus, asthma, emphysema)       8. CAUSE: What do you think is causing the breathing problem?       9. OTHER SYMPTOMS: Do you have any other symptoms? (e.g., chest pain, cough, dizziness, fever, runny nose)     Tightness, cough, dizziness 10. O2 SATURATION MONITOR:  Do you use an oxygen saturation monitor (pulse oximeter) at home? If Yes, ask: What is your reading (oxygen level) today? What is your usual oxygen saturation reading? (e.g., 95%)       96  Protocols used: Breathing Difficulty-A-AH  "

## 2025-01-04 ENCOUNTER — Other Ambulatory Visit: Payer: Self-pay

## 2025-01-04 ENCOUNTER — Emergency Department (HOSPITAL_COMMUNITY)

## 2025-01-04 ENCOUNTER — Inpatient Hospital Stay (HOSPITAL_COMMUNITY)
Admission: EM | Admit: 2025-01-04 | Discharge: 2025-01-07 | DRG: 194 | Disposition: A | Attending: Internal Medicine | Admitting: Internal Medicine

## 2025-01-04 DIAGNOSIS — Z96643 Presence of artificial hip joint, bilateral: Secondary | ICD-10-CM | POA: Diagnosis present

## 2025-01-04 DIAGNOSIS — E039 Hypothyroidism, unspecified: Secondary | ICD-10-CM | POA: Diagnosis present

## 2025-01-04 DIAGNOSIS — I130 Hypertensive heart and chronic kidney disease with heart failure and stage 1 through stage 4 chronic kidney disease, or unspecified chronic kidney disease: Secondary | ICD-10-CM | POA: Diagnosis present

## 2025-01-04 DIAGNOSIS — Z7982 Long term (current) use of aspirin: Secondary | ICD-10-CM

## 2025-01-04 DIAGNOSIS — J189 Pneumonia, unspecified organism: Secondary | ICD-10-CM | POA: Diagnosis present

## 2025-01-04 DIAGNOSIS — Z888 Allergy status to other drugs, medicaments and biological substances status: Secondary | ICD-10-CM

## 2025-01-04 DIAGNOSIS — Z86718 Personal history of other venous thrombosis and embolism: Secondary | ICD-10-CM | POA: Diagnosis not present

## 2025-01-04 DIAGNOSIS — Z885 Allergy status to narcotic agent status: Secondary | ICD-10-CM

## 2025-01-04 DIAGNOSIS — Z79899 Other long term (current) drug therapy: Secondary | ICD-10-CM | POA: Diagnosis not present

## 2025-01-04 DIAGNOSIS — Z7902 Long term (current) use of antithrombotics/antiplatelets: Secondary | ICD-10-CM

## 2025-01-04 DIAGNOSIS — N1832 Chronic kidney disease, stage 3b: Secondary | ICD-10-CM | POA: Diagnosis present

## 2025-01-04 DIAGNOSIS — I2510 Atherosclerotic heart disease of native coronary artery without angina pectoris: Secondary | ICD-10-CM | POA: Diagnosis present

## 2025-01-04 DIAGNOSIS — Z79890 Hormone replacement therapy (postmenopausal): Secondary | ICD-10-CM | POA: Diagnosis not present

## 2025-01-04 DIAGNOSIS — Z8249 Family history of ischemic heart disease and other diseases of the circulatory system: Secondary | ICD-10-CM

## 2025-01-04 DIAGNOSIS — Z1152 Encounter for screening for COVID-19: Secondary | ICD-10-CM | POA: Diagnosis not present

## 2025-01-04 DIAGNOSIS — E785 Hyperlipidemia, unspecified: Secondary | ICD-10-CM | POA: Diagnosis present

## 2025-01-04 DIAGNOSIS — E860 Dehydration: Secondary | ICD-10-CM | POA: Diagnosis present

## 2025-01-04 DIAGNOSIS — I480 Paroxysmal atrial fibrillation: Secondary | ICD-10-CM | POA: Diagnosis present

## 2025-01-04 DIAGNOSIS — N179 Acute kidney failure, unspecified: Secondary | ICD-10-CM

## 2025-01-04 DIAGNOSIS — Z87891 Personal history of nicotine dependence: Secondary | ICD-10-CM

## 2025-01-04 DIAGNOSIS — I5042 Chronic combined systolic (congestive) and diastolic (congestive) heart failure: Secondary | ICD-10-CM | POA: Diagnosis present

## 2025-01-04 DIAGNOSIS — Z87442 Personal history of urinary calculi: Secondary | ICD-10-CM

## 2025-01-04 DIAGNOSIS — Z955 Presence of coronary angioplasty implant and graft: Secondary | ICD-10-CM

## 2025-01-04 DIAGNOSIS — I2489 Other forms of acute ischemic heart disease: Secondary | ICD-10-CM | POA: Diagnosis present

## 2025-01-04 DIAGNOSIS — R55 Syncope and collapse: Secondary | ICD-10-CM

## 2025-01-04 DIAGNOSIS — K529 Noninfective gastroenteritis and colitis, unspecified: Secondary | ICD-10-CM | POA: Diagnosis present

## 2025-01-04 DIAGNOSIS — Z7901 Long term (current) use of anticoagulants: Secondary | ICD-10-CM

## 2025-01-04 DIAGNOSIS — N400 Enlarged prostate without lower urinary tract symptoms: Secondary | ICD-10-CM | POA: Diagnosis present

## 2025-01-04 DIAGNOSIS — I959 Hypotension, unspecified: Secondary | ICD-10-CM | POA: Diagnosis not present

## 2025-01-04 DIAGNOSIS — K219 Gastro-esophageal reflux disease without esophagitis: Secondary | ICD-10-CM | POA: Diagnosis present

## 2025-01-04 LAB — CBC WITH DIFFERENTIAL/PLATELET
Abs Immature Granulocytes: 0.07 10*3/uL (ref 0.00–0.07)
Basophils Absolute: 0.1 10*3/uL (ref 0.0–0.1)
Basophils Relative: 1 %
Eosinophils Absolute: 0.1 10*3/uL (ref 0.0–0.5)
Eosinophils Relative: 1 %
HCT: 41.7 % (ref 39.0–52.0)
Hemoglobin: 13.8 g/dL (ref 13.0–17.0)
Immature Granulocytes: 1 %
Lymphocytes Relative: 12 %
Lymphs Abs: 1.1 10*3/uL (ref 0.7–4.0)
MCH: 34.9 pg — ABNORMAL HIGH (ref 26.0–34.0)
MCHC: 33.1 g/dL (ref 30.0–36.0)
MCV: 105.6 fL — ABNORMAL HIGH (ref 80.0–100.0)
Monocytes Absolute: 0.7 10*3/uL (ref 0.1–1.0)
Monocytes Relative: 8 %
Neutro Abs: 7.2 10*3/uL (ref 1.7–7.7)
Neutrophils Relative %: 77 %
Platelets: 481 10*3/uL — ABNORMAL HIGH (ref 150–400)
RBC: 3.95 MIL/uL — ABNORMAL LOW (ref 4.22–5.81)
RDW: 13.6 % (ref 11.5–15.5)
WBC: 9.2 10*3/uL (ref 4.0–10.5)
nRBC: 0 % (ref 0.0–0.2)

## 2025-01-04 LAB — RESP PANEL BY RT-PCR (RSV, FLU A&B, COVID)  RVPGX2
Influenza A by PCR: NEGATIVE
Influenza B by PCR: NEGATIVE
Resp Syncytial Virus by PCR: NEGATIVE
SARS Coronavirus 2 by RT PCR: NEGATIVE

## 2025-01-04 LAB — COMPREHENSIVE METABOLIC PANEL WITH GFR
ALT: 23 U/L (ref 0–44)
AST: 24 U/L (ref 15–41)
Albumin: 3.6 g/dL (ref 3.5–5.0)
Alkaline Phosphatase: 74 U/L (ref 38–126)
Anion gap: 12 (ref 5–15)
BUN: 26 mg/dL — ABNORMAL HIGH (ref 8–23)
CO2: 24 mmol/L (ref 22–32)
Calcium: 9.7 mg/dL (ref 8.9–10.3)
Chloride: 100 mmol/L (ref 98–111)
Creatinine, Ser: 2.04 mg/dL — ABNORMAL HIGH (ref 0.61–1.24)
GFR, Estimated: 32 mL/min — ABNORMAL LOW
Glucose, Bld: 143 mg/dL — ABNORMAL HIGH (ref 70–99)
Potassium: 4.3 mmol/L (ref 3.5–5.1)
Sodium: 136 mmol/L (ref 135–145)
Total Bilirubin: 0.7 mg/dL (ref 0.0–1.2)
Total Protein: 7.6 g/dL (ref 6.5–8.1)

## 2025-01-04 LAB — I-STAT CG4 LACTIC ACID, ED: Lactic Acid, Venous: 1.5 mmol/L (ref 0.5–1.9)

## 2025-01-04 LAB — TROPONIN T, HIGH SENSITIVITY
Troponin T High Sensitivity: 29 ng/L — ABNORMAL HIGH (ref 0–19)
Troponin T High Sensitivity: 26 ng/L — ABNORMAL HIGH (ref 0–19)

## 2025-01-04 LAB — LIPASE, BLOOD: Lipase: 58 U/L — ABNORMAL HIGH (ref 11–51)

## 2025-01-04 MED ORDER — IPRATROPIUM-ALBUTEROL 0.5-2.5 (3) MG/3ML IN SOLN: 3.0000 mL | Freq: Four times a day (QID) | RESPIRATORY_TRACT | Status: DC | PRN

## 2025-01-04 MED ORDER — AZITHROMYCIN 500 MG PO TABS
500.0000 mg | ORAL_TABLET | Freq: Every day | ORAL | Status: AC
  Administered 2025-01-04 – 2025-01-06 (×3): 500 mg via ORAL
  Filled 2025-01-04 (×3): qty 1

## 2025-01-04 MED ORDER — SODIUM CHLORIDE 0.9 % IV BOLUS
500.0000 mL | Freq: Once | INTRAVENOUS | Status: AC
  Administered 2025-01-04: 500 mL via INTRAVENOUS

## 2025-01-04 MED ORDER — AMIODARONE HCL 200 MG PO TABS
200.0000 mg | ORAL_TABLET | Freq: Every day | ORAL | Status: DC
  Administered 2025-01-05 – 2025-01-07 (×3): 200 mg via ORAL
  Filled 2025-01-04 (×3): qty 1

## 2025-01-04 MED ORDER — SODIUM CHLORIDE 0.9 % IV SOLN
1.0000 g | Freq: Once | INTRAVENOUS | Status: AC
  Administered 2025-01-04: 1 g via INTRAVENOUS
  Filled 2025-01-04: qty 10

## 2025-01-04 MED ORDER — ONDANSETRON HCL 4 MG/2ML IJ SOLN
4.0000 mg | Freq: Once | INTRAMUSCULAR | Status: AC
  Administered 2025-01-04: 4 mg via INTRAVENOUS
  Filled 2025-01-04: qty 2

## 2025-01-04 MED ORDER — SACUBITRIL-VALSARTAN 24-26 MG PO TABS
1.0000 | ORAL_TABLET | Freq: Two times a day (BID) | ORAL | Status: DC
  Administered 2025-01-04: 1 via ORAL
  Filled 2025-01-04 (×3): qty 1

## 2025-01-04 MED ORDER — ROSUVASTATIN CALCIUM 20 MG PO TABS
20.0000 mg | ORAL_TABLET | Freq: Every day | ORAL | Status: DC
  Administered 2025-01-04 – 2025-01-07 (×4): 20 mg via ORAL
  Filled 2025-01-04 (×4): qty 1

## 2025-01-04 MED ORDER — ENOXAPARIN SODIUM 30 MG/0.3ML IJ SOSY: 30.0000 mg | PREFILLED_SYRINGE | INTRAMUSCULAR | Status: DC

## 2025-01-04 MED ORDER — APIXABAN 2.5 MG PO TABS
2.5000 mg | ORAL_TABLET | Freq: Two times a day (BID) | ORAL | Status: DC
  Administered 2025-01-04 – 2025-01-07 (×6): 2.5 mg via ORAL
  Filled 2025-01-04 (×6): qty 1

## 2025-01-04 MED ORDER — SODIUM CHLORIDE 0.9 % IV SOLN: 1.0000 g | INTRAVENOUS | Status: DC

## 2025-01-04 MED ORDER — LEVOTHYROXINE SODIUM 75 MCG PO TABS
175.0000 ug | ORAL_TABLET | Freq: Every day | ORAL | Status: DC
  Administered 2025-01-05 – 2025-01-07 (×3): 175 ug via ORAL
  Filled 2025-01-04 (×3): qty 1

## 2025-01-04 MED ORDER — SODIUM CHLORIDE 0.9 % IV SOLN: 100.0000 mg | Freq: Two times a day (BID) | INTRAVENOUS | Status: DC

## 2025-01-04 MED ORDER — FUROSEMIDE 20 MG PO TABS
20.0000 mg | ORAL_TABLET | Freq: Every day | ORAL | Status: DC
  Filled 2025-01-04: qty 1

## 2025-01-04 NOTE — ED Triage Notes (Addendum)
 PT presents with cough x 3 wks and NVD x 1 week. Poor PO intake;SABRA Family endorses syncope and Weakness associated with these symptoms.

## 2025-01-04 NOTE — ED Provider Triage Note (Signed)
 Emergency Medicine Provider Triage Evaluation Note  OSHUA [name] , a 84 y.o. male  was evaluated in triage.  Pt complains of cough for 3 weeks, nausea, vomiting, diarrhea for the last week, decreased p.o. intake, syncope, weakness, dizziness.  He reports this morning he got up and went to the bathroom, was feeling dizzy/lightheaded, went back to sit on the bed and reportedly passed out briefly.  Did not hit head or lose consciousness, is not sure how long he was out.  Endorses that he has been having some intermittent chest tightness with coughing.  Denies any chest pain with exertion.  Reports around 10 to 15 pound weight loss in the last 2 to 3 weeks..  Review of Systems  Positive: Sycnope, weakness, NVD Negative: Shortness of breath  Physical Exam  BP 116/73   Pulse 99   Resp 18   Ht 6' (1.829 m)   Wt 79.4 kg   SpO2 96%   BMI 23.73 kg/m  Gen:   Awake, no distress   Resp:  Normal effort  MSK:   Moves extremities without difficulty  Other:  Moves all 4 limbs spontaneously, CN II through XII grossly intact, can ambulate without difficulty, intact sensation throughout.  Medical Decision Making  Medically screening exam initiated at 11:47 AM.  Appropriate orders placed.  Curly [name] Kerns was informed that the remainder of the evaluation will be completed by another provider, this initial triage assessment does not replace that evaluation, and the importance of remaining in the ED until their evaluation is complete.  Workup initiated in triage    Thomas [name], [place] 01/04/25 1149

## 2025-01-04 NOTE — Plan of Care (Signed)
   Problem: Education: Goal: Knowledge of General Education information will improve Description: Including pain rating scale, medication(s)/side effects and non-pharmacologic comfort measures Outcome: Progressing   Problem: Clinical Measurements: Goal: Ability to maintain clinical measurements within normal limits will improve Outcome: Progressing Goal: Diagnostic test results will improve Outcome: Progressing   Problem: Activity: Goal: Risk for activity intolerance will decrease Outcome: Progressing   Problem: Nutrition: Goal: Adequate nutrition will be maintained Outcome: Progressing

## 2025-01-04 NOTE — H&P (Signed)
 " History and Physical    Patient: Thomas [name] FMW:[id number] DOB: 1941-06-09 DOA: 01/04/2025 DOS: the patient was seen and examined on 01/04/2025 PCP: Vincente Shivers, NP  Patient coming from: Home  Chief Complaint:  Chief Complaint  Patient presents with   Syncopal Episode   Emesis   HPI: Thomas [name] is a 84 y.o. male with medical history significant of CAD s/p PCI x2, Afib on Eliquis , HTN, HLD, CKD3, BPH, hypothyroidism, GERD, and recent admission in 11/2024 for ADHF who p/w DOE/fatigue and found to have RLL CAP.  The patient has been feeling unwell for the past 3 weeks, including dry coughing spells, particularly at night, which interfered with sleep. Earlier today, the patient became dizzy and weak while moving from the bathroom to the bedroom. The patient sat on the bed and fell backward due to weakness. There was no loss of consciousness. The patient called his daughter to report the incident and she advised him to present to the ED for further evaluation.  In the ED, pt AFVSS. Labs notable for Cr 2.04 (baseline 1.4-1.7). CXR showed RLL consolidation c/f atypical infection. EDP started CAP abx and requested medicine admission.   Review of Systems: As mentioned in the history of present illness. All other systems reviewed and are negative. Past Medical History:  Diagnosis Date   Arthritis    Bifascicular bundle branch block    Bigeminy 06/2019   CKD (chronic kidney disease) stage 3, GFR 30-59 ml/min (HCC) 10/26/2022   Coronary artery disease    takes Plavix  and ASA daily   GERD (gastroesophageal reflux disease)    occasional and will take Tums if needed   History of blood clots 2008   in chest   History of kidney stones late 90's   Hyperlipidemia    takes Simvastatin  daily   Hypertension    takes Maxzide  daily   Hypothyroidism    takes Synthroid  daily   Joint pain    Joint swelling    left knee   New onset atrial fibrillation (HCC) 02/04/2024   Pneumonia    at  84 years old, double   Past Surgical History:  Procedure Laterality Date   ARTERY BIOPSY Left 12/15/2021   Procedure: LEFT TEMPORAL ARTERY BIOPSY;  Surgeon: Magda Debby SAILOR, MD;  Location: [facility] OR;  Service: Vascular;  Laterality: Left;   CARDIAC CATHETERIZATION     CARDIOVERSION N/A 05/17/2024   Procedure: CARDIOVERSION;  Surgeon: Jeffrie Oneil BROCKS, MD;  Location: [facility];  Service: Cardiovascular;  Laterality: N/A;   CHOLECYSTECTOMY N/A 06/06/2018   Procedure: LAPAROSCOPIC CHOLECYSTECTOMY WITH INTRAOPERATIVE CHOLANGIOGRAM;  Surgeon: Belinda Cough, MD;  Location: [facility] OR;  Service: General;  Laterality: N/A;   CORONARY ANGIOPLASTY  05/21/2007   lad stent/taxus stent   CYSTOSCOPY WITH RETROGRADE PYELOGRAM, URETEROSCOPY AND STENT PLACEMENT Right 03/29/2022   Procedure: CYSTOSCOPY WITH RETROGRADE PYELOGRAM AND STENT PLACEMENT;  Surgeon: Carolee Sherwood JONETTA DOUGLAS, MD;  Location: [facility] ORS;  Service: Urology;  Laterality: Right;   CYSTOSCOPY/URETEROSCOPY/HOLMIUM LASER/STENT PLACEMENT Right 04/18/2022   Procedure: CYSTOSCOPY\ RIGHT URETEROSCOPY/STONE BASKET EXTRACTION/STENT PLACEMENT;  Surgeon: Carolee Sherwood JONETTA DOUGLAS, MD;  Location: [facility] ORS;  Service: Urology;  Laterality: Right;  60 MINS FOR CASE   HEMORROIDECTOMY  late 1970's   HERNIA REPAIR Right 1980's   IR CYSTOSTOMY TUBE PLACEMENT/BLADDER ASPIRATION  07/09/2024   RIGHT/LEFT HEART CATH AND CORONARY ANGIOGRAPHY N/A 10/26/2022   Procedure: RIGHT/LEFT HEART CATH AND CORONARY ANGIOGRAPHY;  Surgeon: Anner Alm ORN, MD;  Location:  [facility];  Service: Cardiovascular;  Laterality: N/A;   TOTAL HIP ARTHROPLASTY Right 10/28/2013   Procedure: RIGHT TOTAL HIP ARTHROPLASTY ANTERIOR APPROACH;  Surgeon: Dempsey LULLA Moan, MD;  Location: [facility] ORS;  Service: Orthopedics;  Laterality: Right;   TOTAL HIP ARTHROPLASTY Left 05/08/2015   Procedure: LEFT TOTAL HIP ARTHROPLASTY ANTERIOR APPROACH;  Surgeon: Dempsey Moan, MD;  Location: [facility] OR;  Service: Orthopedics;  Laterality:  Left;   Social History:  reports that he quit smoking about 10 years ago. His smoking use included cigarettes. He started smoking about 62 years ago. He has a 13 pack-year smoking history. He has never used smokeless tobacco. He reports that he does not currently use alcohol. He reports that he does not use drugs.  Allergies[1]  Family History  Problem Relation Age of Onset   Heart disease Mother    CAD Mother    Heart failure Mother    Heart attack Mother    Pneumonia Father    Heart failure Father    Heart disease Brother    Cancer Brother    Cancer Brother    Early death Daughter    Cancer Daughter     Prior to Admission medications  Medication Sig Start Date End Date Taking? Authorizing Provider  amiodarone  (PACERONE ) 200 MG tablet TAKE 1 TABLET BY MOUTH ONCE DAILY MONDAY THROUGH FRIDAY 02/15/24   Waddell Danelle ORN, MD  apixaban  (ELIQUIS ) 2.5 MG TABS tablet Take 1 tablet (2.5 mg total) by mouth 2 (two) times daily. 08/07/24   Aniceto Daphne CROME, NP  benzonatate  (TESSALON ) 200 MG capsule Take 1 capsule (200 mg total) by mouth 3 (three) times daily as needed. 12/17/24   Vincente Shivers, NP  furosemide  (LASIX ) 20 MG tablet Take 1 tablet (20 mg total) by mouth daily. 11/25/24   Vann, Jessica U, DO  levothyroxine  (SYNTHROID ) 175 MCG tablet Take 175 mcg by mouth daily before breakfast. 01/04/18   [provider]  MYRBETRIQ 50 MG TB24 tablet Take 50 mg by mouth daily. 11/12/24   [provider]  potassium chloride  SA (KLOR-CON  M) 20 MEQ tablet Take 1 tablet (20 mEq total) by mouth daily. 11/25/24 12/25/24  Patwardhan, Newman PARAS, MD  rosuvastatin  (CRESTOR ) 20 MG tablet Take 1 tablet (20 mg total) by mouth daily. 08/06/24   Patwardhan, Newman PARAS, MD  sacubitril -valsartan  (ENTRESTO ) 24-26 MG TAKE 1 TABLET BY MOUTH TWICE DAILY 04/22/24   Nahser, Aleene PARAS, MD    Physical Exam: Vitals:   01/04/25 1134 01/04/25 1136 01/04/25 1226  BP: 116/73  (!) 131/108  Pulse: 99  87  Resp: 18  (!) 24   Temp:   97.7 F (36.5 C)  TempSrc:   Oral  SpO2: 96%  99%  Weight:  79.4 kg   Height:  6' (1.829 m)    General: Alert, oriented x3, resting comfortably in no acute distress Respiratory: RLL rhonchi; no wheezing Cardiovascular: Regular rate and rhythm w/o m/r/g Abdomen: Soft, nontender, nondistended. Positive bowel sounds   Data Reviewed:  Lab Results  Component Value Date   WBC 9.2 01/04/2025   HGB 13.8 01/04/2025   HCT 41.7 01/04/2025   MCV 105.6 (H) 01/04/2025   PLT 481 (H) 01/04/2025   Lab Results  Component Value Date   GLUCOSE 143 (H) 01/04/2025   CALCIUM  9.7 01/04/2025   NA 136 01/04/2025   K 4.3 01/04/2025   CO2 24 01/04/2025   CL 100 01/04/2025   BUN 26 (H) 01/04/2025   CREATININE  2.04 (H) 01/04/2025   Lab Results  Component Value Date   ALT 23 01/04/2025   AST 24 01/04/2025   ALKPHOS 74 01/04/2025   BILITOT 0.7 01/04/2025   Lab Results  Component Value Date   INR 1.4 (H) 11/24/2024   INR 1.3 (H) 11/23/2024   INR 1.1 07/09/2024   Radiology: DG Chest Portable 1 View Result Date: 01/04/2025 EXAM: 1 VIEW(S) XRAY OF THE CHEST 01/04/2025 12:08:00 PM COMPARISON: 11/23/2024 CLINICAL HISTORY: Cough and chest tightness. FINDINGS: LUNGS AND PLEURA: Low lung volumes. Bibasilar interstitial coarsening. Irregular nodule or opacity in the right lower lung. No pleural effusion. No pneumothorax. HEART AND MEDIASTINUM: Tortuous aorta with atherosclerosis. No acute abnormality of the cardiac silhouette. BONES AND SOFT TISSUES: Multilevel degenerative disc disease of spine. IMPRESSION: 1. Findings favor atypical infection. Nodular opacity in the right lower lung is likely infectious. Follow up in 8 - 12 weeks is recommended to ensure resolution. 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  RLL CAP -IV CTX 1g daily to complete 5 day CAP course -PO azithromycin  500mg  daily to complete 3 day CAP course -Duonebs prn -Wean O2 as tolerated -Ambulatory pulse ox prior to d/c  Afib -PTA amiodarone  and Eliquis  2.5mg  BID  HFpEF -PTA Entresto , Crestor , and lasix   Hypothyroidism -PTA Synthroid    Advance Care Planning:   Code Status: Full Code   Consults: N/A  Family Communication: N/A  Severity of Illness: The appropriate patient status for this patient is INPATIENT. Inpatient status is judged to be reasonable and necessary in order to provide the required intensity of service to ensure the patient's safety. The patient's presenting symptoms, physical exam findings, and initial radiographic and laboratory data in the context of their chronic comorbidities is felt to place them at high risk for further clinical deterioration. Furthermore, it is not anticipated that the patient will be medically stable for discharge from the hospital within 2 midnights of admission.   * I certify that at the point of admission it is my clinical judgment that the patient will require inpatient hospital care spanning beyond 2 midnights from the point of admission due to high intensity of service, high risk for further deterioration and high frequency of surveillance required.*   ------- I spent 65 minutes reviewing previous notes, at the bedside counseling/discussing the treatment plan, and performing clinical documentation.  Author: Marsha Ada, MD 01/04/2025 1:44 PM  For on call review [email or web address].     [1]  Allergies Allergen Reactions   Hydrocodone  Nausea And Vomiting and Other (See Comments)    Dizziness and upset stomach   Oxycodone  Nausea And Vomiting and Nausea Only   Robaxin  [Methocarbamol ] Nausea And Vomiting   Ultram  [Tramadol ] Nausea And Vomiting   "

## 2025-01-04 NOTE — ED Notes (Signed)
 Phlebotomy to collect outstanding labs.

## 2025-01-04 NOTE — ED Notes (Signed)
 Patient transported to CT

## 2025-01-04 NOTE — ED Provider Notes (Signed)
 " [place] [facility] Provider Note   CSN: [id number] Arrival date & time: 01/04/25  1126     Patient presents with: Syncopal Episode and Emesis   Thomas [name] is a 84 y.o. male patient with past medical history of CHF, coronary artery disease, hypertension, hyperlipidemia, hypothyroidism presents emergency room today after a syncopal episode at home.  Patient reports that for the last 3 to 4 weeks she has been having dry cough, coughing will cause him to feel short of breath, cough is nonproductive and causing chest discomfort.  He reports he has been getting progressively weaker and over the last week and a half he has had very poor episode appetite and associated nausea and diarrhea.  Today he was walking to his bedroom he sat down and then had what sounds to be near syncopal episode.  He did not hit his head and it does not sound acute for loss of consciousness.  No seizure activity noted.  No head or neck pain. EKG does show that he is in rate controlled A-fib.  He is on amiodarone  and Eliquis .  Reports he has not missed any doses.    Emesis Associated symptoms: cough        Prior to Admission medications  Medication Sig Start Date End Date Taking? Authorizing Provider  amiodarone  (PACERONE ) 200 MG tablet TAKE 1 TABLET BY MOUTH ONCE DAILY MONDAY THROUGH FRIDAY 02/15/24   Waddell Danelle ORN, MD  apixaban  (ELIQUIS ) 2.5 MG TABS tablet Take 1 tablet (2.5 mg total) by mouth 2 (two) times daily. 08/07/24   Aniceto Daphne CROME, NP  benzonatate  (TESSALON ) 200 MG capsule Take 1 capsule (200 mg total) by mouth 3 (three) times daily as needed. 12/17/24   Vincente Shivers, NP  furosemide  (LASIX ) 20 MG tablet Take 1 tablet (20 mg total) by mouth daily. 11/25/24   Vann, Jessica U, DO  levothyroxine  (SYNTHROID ) 175 MCG tablet Take 175 mcg by mouth daily before breakfast. 01/04/18   [provider]  MYRBETRIQ 50 MG TB24 tablet Take 50 mg by mouth daily. 11/12/24    [provider]  potassium chloride  SA (KLOR-CON  M) 20 MEQ tablet Take 1 tablet (20 mEq total) by mouth daily. 11/25/24 12/25/24  Patwardhan, Newman PARAS, MD  rosuvastatin  (CRESTOR ) 20 MG tablet Take 1 tablet (20 mg total) by mouth daily. 08/06/24   Patwardhan, Newman PARAS, MD  sacubitril -valsartan  (ENTRESTO ) 24-26 MG TAKE 1 TABLET BY MOUTH TWICE DAILY 04/22/24   Nahser, Aleene PARAS, MD    Allergies: Hydrocodone , Oxycodone , Robaxin  [methocarbamol ], and Ultram  [tramadol ]    Review of Systems  Respiratory:  Positive for cough.   Cardiovascular:  Positive for chest pain.  Gastrointestinal:  Positive for nausea and vomiting.    Updated Vital Signs BP (!) 131/108 (BP Location: Right Arm)   Pulse 87   Temp 97.7 F (36.5 C) (Oral)   Resp (!) 24   Ht 6' (1.829 m)   Wt 79.4 kg   SpO2 99%   BMI 23.73 kg/m   Physical Exam Vitals and nursing note reviewed.  Constitutional:      General: He is not in acute distress.    Appearance: He is not toxic-appearing.  HENT:     Head: Normocephalic and atraumatic.  Eyes:     General: No scleral icterus.    Conjunctiva/sclera: Conjunctivae normal.     Comments: Appears dry.  Cardiovascular:     Rate and Rhythm: Normal rate and regular rhythm.  Pulses: Normal pulses.     Heart sounds: Normal heart sounds.  Pulmonary:     Effort: Pulmonary effort is normal. No respiratory distress.     Breath sounds: Normal breath sounds.  Abdominal:     General: Abdomen is flat. Bowel sounds are normal.     Palpations: Abdomen is soft.     Tenderness: There is no abdominal tenderness.     Comments: No focal area of abdominal tenderness on exam.  Has suprapubic cath.  Skin:    General: Skin is warm and dry.     Findings: No lesion.  Neurological:     General: No focal deficit present.     Mental Status: He is alert and oriented to person, place, and time. Mental status is at baseline.     Comments: No focal deficits, generalized weakness.     (all  labs ordered are listed, but only abnormal results are displayed) Labs Reviewed  CBC WITH DIFFERENTIAL/PLATELET - Abnormal; Notable for the following components:      Result Value   RBC 3.95 (*)    MCV 105.6 (*)    MCH 34.9 (*)    Platelets 481 (*)    All other components within normal limits  COMPREHENSIVE METABOLIC PANEL WITH GFR - Abnormal; Notable for the following components:   Glucose, Bld 143 (*)    BUN 26 (*)    Creatinine, Ser 2.04 (*)    GFR, Estimated 32 (*)    All other components within normal limits  LIPASE, BLOOD - Abnormal; Notable for the following components:   Lipase 58 (*)    All other components within normal limits  TROPONIN T, HIGH SENSITIVITY - Abnormal; Notable for the following components:   Troponin T High Sensitivity 29 (*)    All other components within normal limits  RESP PANEL BY RT-PCR (RSV, FLU A&B, COVID)  RVPGX2  CULTURE, BLOOD (ROUTINE X 2)  CULTURE, BLOOD (ROUTINE X 2)  URINALYSIS, ROUTINE W REFLEX MICROSCOPIC  I-STAT CG4 LACTIC ACID, ED  TROPONIN T, HIGH SENSITIVITY    EKG: EKG Interpretation Date/Time:  Saturday January 04 2025 12:30:22 EST Ventricular Rate:  87 PR Interval:    QRS Duration:  154 QT Interval:  422 QTC Calculation: 508 R Axis:   -74  Text Interpretation: Atrial fibrillation Ventricular premature complex Right bundle branch block Anterior infarct, age indeterminate when compared to prior, smilar appearnce No STEMI Confirmed by Ginger Barefoot ([id number]) on 01/04/2025 1:10:16 PM  Radiology: CT Head Wo Contrast Result Date: 01/04/2025 CLINICAL DATA:  Syncope. EXAM: CT HEAD WITHOUT CONTRAST TECHNIQUE: Contiguous axial images were obtained from the base of the skull through the vertex without intravenous contrast. RADIATION DOSE REDUCTION: This exam was performed according to the departmental dose-optimization program which includes automated exposure control, adjustment of the mA and/or kV according to patient size and/or use of  iterative reconstruction technique. COMPARISON:  None Available. FINDINGS: Brain: No evidence of intracranial hemorrhage, acute infarction, hydrocephalus, extra-axial collection, or mass lesion/mass effect. Moderate diffuse cerebral atrophy and mild chronic small vessel disease are noted. Vascular:  No hyperdense vessel or other acute findings. Skull: No evidence of fracture or other significant bone abnormality. Sinuses/Orbits:  No acute findings. Other: None. IMPRESSION: No acute intracranial abnormality. 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 Electronically Signed   By: Norleen DELENA Kil M.D.   On: 01/04/2025 13:54   DG Chest Portable 1 View Result Date: 01/04/2025 EXAM: 1 VIEW(S) XRAY OF THE CHEST 01/04/2025  12:08:00 PM COMPARISON: 11/23/2024 CLINICAL HISTORY: Cough and chest tightness. FINDINGS: LUNGS AND PLEURA: Low lung volumes. Bibasilar interstitial coarsening. Irregular nodule or opacity in the right lower lung. No pleural effusion. No pneumothorax. HEART AND MEDIASTINUM: Tortuous aorta with atherosclerosis. No acute abnormality of the cardiac silhouette. BONES AND SOFT TISSUES: Multilevel degenerative disc disease of spine. IMPRESSION: 1. Findings favor atypical infection. Nodular opacity in the right lower lung is likely infectious. Follow up in 8 - 12 weeks is recommended to ensure resolution. Electronically signed by: Norman Gatlin MD 01/04/2025 12:40 PM EST RP Workstation: [id number]     Procedures   Medications Ordered in the ED  cefTRIAXone  (ROCEPHIN ) 1 g in sodium chloride  0.9 % 100 mL IVPB (1 g Intravenous New Bag/Given 01/04/25 1336)  cefTRIAXone  (ROCEPHIN ) 1 g in sodium chloride  0.9 % 100 mL IVPB (has no administration in time range)  azithromycin  (ZITHROMAX ) tablet 500 mg (has no administration in time range)  amiodarone  (PACERONE ) tablet 200 mg (has no administration in time range)  apixaban  (ELIQUIS ) tablet 2.5 mg (has no administration in time range)   furosemide  (LASIX ) tablet 20 mg (has no administration in time range)  levothyroxine  (SYNTHROID ) tablet 175 mcg (has no administration in time range)  rosuvastatin  (CRESTOR ) tablet 20 mg (has no administration in time range)  sacubitril -valsartan  (ENTRESTO ) 24-26 mg per tablet (has no administration in time range)  ipratropium-albuterol  (DUONEB) 0.5-2.5 (3) MG/3ML nebulizer solution 3 mL (has no administration in time range)  ondansetron  (ZOFRAN ) injection 4 mg (4 mg Intravenous Given 01/04/25 1335)  sodium chloride  0.9 % bolus 500 mL (500 mLs Intravenous New Bag/Given 01/04/25 1335)                                    Medical Decision Making Risk Prescription drug management. Decision regarding hospitalization.   This patient presents to the ED for concern of generalized weakness/syncope, this involves an extensive number of treatment options, and is a complaint that carries with it a high risk of complications and morbidity.  The differential diagnosis includes PE, ACS, pneumonia, pneumothorax, CHF, electrolyte abnormality, dehydration   Co morbidities that complicate the patient evaluation  CHF   Additional history obtained:  Additional history obtained from patient was seen by primary care on the sixth of this month.  Patient was discharged with cough medicine.  Sounds like he has been getting progressively weaker since discharge and has had some intermittent dizziness with this.   Lab Tests:  I personally interpreted labs.  The pertinent results include:   No leukocytosis, no anemia.  CMP is consistent with an AKI creatinine is 2 Troponin is 29 suspect this is demand ischemia, will get delta troponin Lactic is 1.5, blood cultures pending.  Respiratory panel pending   Imaging Studies ordered:  I ordered imaging studies including chest x-ray, CT head  I independently visualized and interpreted imaging which showed patient have atypical right lower lung opacity consistent  with likely pneumonia. CT head no acute findings.   I agree with the radiologist interpretation   Cardiac Monitoring: / EKG:  The patient was maintained on a cardiac monitor.  I personally viewed and interpreted the cardiac monitored which showed an underlying rhythm of: rate controlled a fib   Consultations Obtained:  I requested consultation with the Dr Georgina hospital team,  and discussed lab and imaging findings as well as pertinent plan.   Problem List / ED Course /  Critical interventions / Medication management  Presents after having a syncopal episode at home.  He does report that he has been feeling dehydrated at home with diarrhea and poor oral intake.  On arrival hemodynamically stable and well-appearing.  His EKG does show A-fib with rate controlled.  Reports chest pain and cough. His chest x-ray does show evidence of pneumonia, patient is afebrile here but does report fever at home.  Not currently meeting SIRS criteria but I did add on a lactic and blood cultures due to duration of symptoms and associated fatigue.  Will treat with IV antibiotics.  Troponin is mildly elevated at 29, will get repeat troponin about I feel ACS is less likely.  Troponin likely up secondary to A-fib/demand ischemia.  Less likely to be PE as patient is already anticoagulated and has not missed doses of Eliquis . Patient also reports generalized weakness and was found to have AKI.  He has no focal deficit.  Will give fluids.  Suspect given history of present illness of poor appetite and diarrhea that syncopal versus near syncopal episode was secondary to dehydration.  I have added on orthostatic vital signs. Patient reports he has been having loose stools approximately 2-3 times a day with no black stools no focal abdominal pain.  Abdominal exam here today is benign. I ordered medication including ABX Reevaluation of the patient after these medicines showed that the patient improved I have reviewed the  patients home medicines and have made adjustments as needed. Patient admitted to hospital team.         Final diagnoses:  Pneumonia due to infectious organism, unspecified laterality, unspecified part of lung  Syncope, unspecified syncope type  AKI (acute kidney injury)  Gastroenteritis    ED Discharge Orders     None          Shermon Warren SAILOR, PA-C 01/04/25 1404    Tegeler, Lonni PARAS, MD 01/04/25 1528  "

## 2025-01-05 DIAGNOSIS — J189 Pneumonia, unspecified organism: Secondary | ICD-10-CM | POA: Diagnosis not present

## 2025-01-05 LAB — BASIC METABOLIC PANEL WITH GFR
Anion gap: 8 (ref 5–15)
BUN: 25 mg/dL — ABNORMAL HIGH (ref 8–23)
CO2: 27 mmol/L (ref 22–32)
Calcium: 9.1 mg/dL (ref 8.9–10.3)
Chloride: 102 mmol/L (ref 98–111)
Creatinine, Ser: 1.86 mg/dL — ABNORMAL HIGH (ref 0.61–1.24)
GFR, Estimated: 36 mL/min — ABNORMAL LOW
Glucose, Bld: 90 mg/dL (ref 70–99)
Potassium: 4.2 mmol/L (ref 3.5–5.1)
Sodium: 136 mmol/L (ref 135–145)

## 2025-01-05 LAB — CBC
HCT: 34.9 % — ABNORMAL LOW (ref 39.0–52.0)
Hemoglobin: 11.6 g/dL — ABNORMAL LOW (ref 13.0–17.0)
MCH: 34.3 pg — ABNORMAL HIGH (ref 26.0–34.0)
MCHC: 33.2 g/dL (ref 30.0–36.0)
MCV: 103.3 fL — ABNORMAL HIGH (ref 80.0–100.0)
Platelets: 342 10*3/uL (ref 150–400)
RBC: 3.38 MIL/uL — ABNORMAL LOW (ref 4.22–5.81)
RDW: 13.6 % (ref 11.5–15.5)
WBC: 5.6 10*3/uL (ref 4.0–10.5)
nRBC: 0 % (ref 0.0–0.2)

## 2025-01-05 MED ORDER — SODIUM CHLORIDE 0.9 % IV SOLN
2.0000 g | INTRAVENOUS | Status: DC
  Administered 2025-01-05 – 2025-01-07 (×3): 2 g via INTRAVENOUS
  Filled 2025-01-05 (×3): qty 20

## 2025-01-05 MED ORDER — GUAIFENESIN ER 600 MG PO TB12
600.0000 mg | ORAL_TABLET | Freq: Two times a day (BID) | ORAL | Status: DC
  Administered 2025-01-05 – 2025-01-07 (×5): 600 mg via ORAL
  Filled 2025-01-05 (×5): qty 1

## 2025-01-05 NOTE — Progress Notes (Signed)
 " PROGRESS NOTE    Thomas [name]  FMW:[id number] DOB: 1941/09/04 DOA: 01/04/2025 PCP: Vincente Shivers, NP    Chief Complaint  Patient presents with   Syncopal Episode   Emesis    Brief Narrative:    Thomas [name] is a 84 y.o. male with medical history significant of CAD s/p PCI x2, Afib on Eliquis , HTN, HLD, CKD3, BPH, hypothyroidism, GERD, and recent admission in 11/2024 for ADHF who p/w DOE/fatigue and found to have RLL CAP.   The patient has been feeling unwell for the past 3 weeks, including dry coughing spells, particularly at night, which interfered with sleep. Earlier today, the patient became dizzy and weak while moving from the bathroom to the bedroom. The patient sat on the bed and fell backward due to weakness. There was no loss of consciousness. The patient called his daughter to report the incident and she advised him to present to the ED for further evaluation.   In the ED, pt AFVSS. Labs notable for Cr 2.04 (baseline 1.4-1.7). CXR showed RLL consolidation c/f atypical infection. EDP started CAP abx and requested medicine admission.   Assessment & Plan:   Principal Problem:   CAP (community acquired pneumonia)  RLL CAP - Evident on imaging, continue with IV antibiotics Rocephin  and azithromycin  . - Was encouraged to use incentive spirometry and flutter valve -sputum culture, Legionella antigen, strep pneumonia antigen  Paroxysmal A-fib - Continue with home medication amiodarone   -Continue with Eliquis   - With some mild bradycardia and short pauses, continue to monitor on telemetry   AKI on CKD stage III  - This is due to volume depletion, dehydration and soft blood pressure . - Will hold diuresis . - Hold Entresto  and antihypertensive medications . - Avoid nephrotoxic medications   Hypothyroidism  - Continue with Synthroid  .  Chronic combined systolic/diastolic CHF Hypertension>> hypotension - Appears to be volume depleted/dry. - Continue to hold  diuretics. - Hold Entresto . - Avoid IV fluids as long his p.o. intake is acceptable. - Continue with statin - Hold diuresis and Entresto  given soft blood pressure/orthostasi - Last Echo: 09/04/2024, EF 60-65%, normal LV function, mild concentric LVH   Atherosclerosis of native coronary artery of native heart without angina pectoris - History of coronary artery disease, with 2 stents -Continue Eliquis , continue statins,    Hyperlipidemia Continue statins   Hypomagnesemia - Repleted   BPH (benign prostatic hyperplasia) - With chronic Foley catheter, patient reports it was exchanged 2 ago   DVT prophylaxis: (Eliquis  Code Status: (Full Family Communication: (none at bedside  Status is: Inpatient    Consultants:  None   Subjective:  Patient reports he is feeling better, but still reports generalized weakness, and some dizziness when standing  Objective: Vitals:   01/05/25 0416 01/05/25 0500 01/05/25 0800 01/05/25 1035  BP: 93/61  109/81   Pulse: 76 66 83   Resp: (!) 24 13 16    Temp: (!) 97.4 F (36.3 C)  97.9 F (36.6 C)   TempSrc: Oral  Axillary   SpO2: 96% 99% 94% 94%  Weight:      Height:        Intake/Output Summary (Last 24 hours) at 01/05/2025 1416 Last data filed at 01/05/2025 1045 Gross per 24 hour  Intake --  Output 350 ml  Net -350 ml   Filed Weights   01/04/25 1136  Weight: 79.4 kg    Examination:  Awake Alert, Oriented X 3, No new F.N deficits, Normal affect CTAB Irregular +  ve B.Sounds, Abd Soft, suprapubic catheter present No Cyanosis, Clubbing or edema, No new Rash or bruise     Data Reviewed: I have personally reviewed following labs and imaging studies  CBC: Recent Labs  Lab 01/04/25 1151 01/05/25 0610  WBC 9.2 5.6  NEUTROABS 7.2  --   HGB 13.8 11.6*  HCT 41.7 34.9*  MCV 105.6* 103.3*  PLT 481* 342    Basic Metabolic Panel: Recent Labs  Lab 01/04/25 1151 01/05/25 0610  NA 136 136  K 4.3 4.2  CL 100 102  CO2 24 27   GLUCOSE 143* 90  BUN 26* 25*  CREATININE 2.04* 1.86*  CALCIUM  9.7 9.1    GFR: Estimated Creatinine Clearance: 33.6 mL/min (A) (by C-G formula based on SCr of 1.86 mg/dL (H)).  Liver Function Tests: Recent Labs  Lab 01/04/25 1151  AST 24  ALT 23  ALKPHOS 74  BILITOT 0.7  PROT 7.6  ALBUMIN 3.6    CBG: No results for input(s): GLUCAP in the last 168 hours.   Recent Results (from the past 240 hours)  Resp panel by RT-PCR (RSV, Flu A&B, Covid) Anterior Nasal Swab     Status: None   Collection Time: 01/04/25  1:02 PM   Specimen: Anterior Nasal Swab  Result Value Ref Range Status   SARS Coronavirus 2 by RT PCR NEGATIVE NEGATIVE Final   Influenza A by PCR NEGATIVE NEGATIVE Final   Influenza B by PCR NEGATIVE NEGATIVE Final    Comment: (NOTE) The Xpert Xpress SARS-CoV-2/FLU/RSV plus assay is intended as an aid in the diagnosis of influenza from Nasopharyngeal swab specimens and should not be used as a sole basis for treatment. Nasal washings and aspirates are unacceptable for Xpert Xpress SARS-CoV-2/FLU/RSV testing.  Fact Sheet for Patients: bloggercourse.com  Fact Sheet for Healthcare Providers: seriousbroker.it  This test is not yet approved or cleared by the [place]  FDA and has been authorized for detection and/or diagnosis of SARS-CoV-2 by FDA under an Emergency Use Authorization (EUA). This EUA will remain in effect (meaning this test can be used) for the duration of the COVID-19 declaration under Section 564(b)(1) of the Act, 21 U.S.C. section 360bbb-3(b)(1), unless the authorization is terminated or revoked.     Resp Syncytial Virus by PCR NEGATIVE NEGATIVE Final    Comment: (NOTE) Fact Sheet for Patients: bloggercourse.com  Fact Sheet for Healthcare Providers: seriousbroker.it  This test is not yet approved or cleared by the [place]  FDA  and has been authorized for detection and/or diagnosis of SARS-CoV-2 by FDA under an Emergency Use Authorization (EUA). This EUA will remain in effect (meaning this test can be used) for the duration of the COVID-19 declaration under Section 564(b)(1) of the Act, 21 U.S.C. section 360bbb-3(b)(1), unless the authorization is terminated or revoked.  Performed at [facility], [address] [place], [place] [address]   Blood culture (routine x 2)     Status: None (Preliminary result)   Collection Time: 01/04/25  1:09 PM   Specimen: BLOOD  Result Value Ref Range Status   Specimen Description BLOOD RIGHT ANTECUBITAL  Final   Special Requests   Final    BOTTLES DRAWN AEROBIC AND ANAEROBIC Blood Culture adequate volume   Culture   Final    NO GROWTH < 24 HOURS Performed at [facility], [address], [place], [place] [address]    Report Status PENDING  Incomplete  Blood culture (routine x 2)     Status:  None (Preliminary result)   Collection Time: 01/04/25  5:06 PM   Specimen: BLOOD  Result Value Ref Range Status   Specimen Description BLOOD LEFT ANTECUBITAL  Final   Special Requests   Final    BOTTLES DRAWN AEROBIC AND ANAEROBIC Blood Culture adequate volume   Culture   Final    NO GROWTH < 24 HOURS Performed at [facility], [address] [place], [place] [address]    Report Status PENDING  Incomplete         Radiology Studies: CT Head Wo Contrast Result Date: 01/04/2025 CLINICAL DATA:  Syncope. EXAM: CT HEAD WITHOUT CONTRAST TECHNIQUE: Contiguous axial images were obtained from the base of the skull through the vertex without intravenous contrast. RADIATION DOSE REDUCTION: This exam was performed according to the departmental dose-optimization program which includes automated exposure control, adjustment of the mA and/or kV according to patient size and/or use of iterative reconstruction technique. COMPARISON:  None Available. FINDINGS: Brain: No  evidence of intracranial hemorrhage, acute infarction, hydrocephalus, extra-axial collection, or mass lesion/mass effect. Moderate diffuse cerebral atrophy and mild chronic small vessel disease are noted. Vascular:  No hyperdense vessel or other acute findings. Skull: No evidence of fracture or other significant bone abnormality. Sinuses/Orbits:  No acute findings. Other: None. IMPRESSION: No acute intracranial abnormality. 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 Electronically Signed   By: Norleen DELENA Kil M.D.   On: 01/04/2025 13:54   DG Chest Portable 1 View Result Date: 01/04/2025 EXAM: 1 VIEW(S) XRAY OF THE CHEST 01/04/2025 12:08:00 PM COMPARISON: 11/23/2024 CLINICAL HISTORY: Cough and chest tightness. FINDINGS: LUNGS AND PLEURA: Low lung volumes. Bibasilar interstitial coarsening. Irregular nodule or opacity in the right lower lung. No pleural effusion. No pneumothorax. HEART AND MEDIASTINUM: Tortuous aorta with atherosclerosis. No acute abnormality of the cardiac silhouette. BONES AND SOFT TISSUES: Multilevel degenerative disc disease of spine. IMPRESSION: 1. Findings favor atypical infection. Nodular opacity in the right lower lung is likely infectious. Follow up in 8 - 12 weeks is recommended to ensure resolution. Electronically signed by: Norman Gatlin MD 01/04/2025 12:40 PM EST RP Workstation: [id number]        Scheduled Meds:  amiodarone   200 mg Oral Daily   apixaban   2.5 mg Oral BID   azithromycin   500 mg Oral Daily   levothyroxine   175 mcg Oral QAC breakfast   rosuvastatin   20 mg Oral Daily   Continuous Infusions:  cefTRIAXone  (ROCEPHIN )  IV 2 g (01/05/25 1050)     LOS: 1 day     Brayton Lye, MD [facility]   To contact the attending provider between 7A-7P or the covering provider during after hours 7P-7A, please log into the web site [email or web address] and access using universal [place] password for that web site. If you do not have the  password, please call the hospital operator.  01/05/2025, 2:16 PM   "

## 2025-01-06 DIAGNOSIS — J189 Pneumonia, unspecified organism: Secondary | ICD-10-CM | POA: Diagnosis not present

## 2025-01-06 LAB — PRO BRAIN NATRIURETIC PEPTIDE: Pro Brain Natriuretic Peptide: 3651 pg/mL — ABNORMAL HIGH

## 2025-01-06 LAB — PROCALCITONIN: Procalcitonin: <0.1 ng/mL

## 2025-01-06 MED ORDER — CHLORHEXIDINE GLUCONATE CLOTH 2 % EX PADS: 6.0000 | MEDICATED_PAD | Freq: Every day | CUTANEOUS | Status: DC

## 2025-01-06 MED ORDER — AZITHROMYCIN 500 MG PO TABS
500.0000 mg | ORAL_TABLET | Freq: Every day | ORAL | Status: DC
  Administered 2025-01-07: 500 mg via ORAL
  Filled 2025-01-06: qty 1

## 2025-01-06 MED ORDER — CHLORHEXIDINE GLUCONATE CLOTH 2 % EX PADS
6.0000 | MEDICATED_PAD | Freq: Every day | CUTANEOUS | Status: DC
  Administered 2025-01-07: 6 via TOPICAL

## 2025-01-06 NOTE — Progress Notes (Signed)
 " PROGRESS NOTE    Thomas [name]  FMW:[id number] DOB: [birth year] DOA: 01/04/2025 PCP: Vincente Shivers, NP    Chief Complaint  Patient presents with   Syncopal Episode   Emesis    Brief Narrative:    Thomas [name] is a 84 y.o. male with medical history significant of CAD s/p PCI x2, Afib on Eliquis , HTN, HLD, CKD3, BPH, hypothyroidism, GERD, and recent admission in 11/2024 for ADHF who p/w DOE/fatigue and found to have RLL CAP.   The patient has been feeling unwell for the past 3 weeks, including dry coughing spells, particularly at night, which interfered with sleep. Earlier today, the patient became dizzy and weak while moving from the bathroom to the bedroom. The patient sat on the bed and fell backward due to weakness. There was no loss of consciousness. The patient called his daughter to report the incident and she advised him to present to the ED for further evaluation.   In the ED, pt AFVSS. Labs notable for Cr 2.04 (baseline 1.4-1.7). CXR showed RLL consolidation c/f atypical infection. EDP started CAP abx and requested medicine admission.   Assessment & Plan:   Principal Problem:   CAP (community acquired pneumonia)  RLL CAP - Evident on imaging, continue with IV antibiotics Rocephin  and azithromycin  . - Was encouraged to use incentive spirometry and flutter valve - Blood cultures remain negative  Paroxysmal A-fib - Continue with home medication amiodarone   -Continue with Eliquis   - With some mild bradycardia and short pauses, continue to monitor on telemetry   AKI on CKD stage III  - This is due to volume depletion, dehydration and soft blood pressure . - Will hold diuresis . - Hold Entresto  and antihypertensive medications . - Avoid nephrotoxic medications  - Proving renal function  Hypothyroidism  - Continue with Synthroid  .  Chronic combined systolic/diastolic CHF Hypertension>> hypotension - Appears to be volume depleted/dry. - Continue to hold  diuretics. - Hold Entresto . - Avoid IV fluids as long his p.o. intake is acceptable. - Continue with statin - Hold diuresis and Entresto  given soft blood pressure/orthostasi - Last Echo: 09/04/2024, EF 60-65%, normal LV function, mild concentric LVH   Atherosclerosis of native coronary artery of native heart without angina pectoris - History of coronary artery disease, with 2 stents -Continue Eliquis , continue statins,    Hyperlipidemia Continue statins   Hypomagnesemia - Repleted   BPH (benign prostatic hyperplasia) - With chronic Foley catheter, patient reports it was exchanged 2 ago   DVT prophylaxis: (Eliquis  Code Status: (Full Family Communication: (none at bedside  Status is: Inpatient    Consultants:  None   Subjective:  Patient reports he is feeling better, denies any cough or congestion today, afebrile    Objective: Vitals:   01/06/25 0410 01/06/25 0411 01/06/25 0800 01/06/25 1147  BP: 97/63 (!) 107/52 (!) 106/55   Pulse: 72 74 76   Resp: 20  20   Temp: 97.6 F (36.4 C)  (!) 97 F (36.1 C) (!) 97.5 F (36.4 C)  TempSrc: Oral  Axillary Oral  SpO2: 93%  90%   Weight:      Height:        Intake/Output Summary (Last 24 hours) at 01/06/2025 1331 Last data filed at 01/06/2025 0740 Gross per 24 hour  Intake --  Output 1250 ml  Net -1250 ml   Filed Weights   01/04/25 1136  Weight: 79.4 kg    Examination:  Awake Alert, Oriented X 3, No new  F.N deficits, Normal affect Good air entry bilaterally Irregular irregular +ve B.Sounds, Abd Soft, suprapubic catheter present No Cyanosis, Clubbing or edema, No new Rash or bruise     Data Reviewed: I have personally reviewed following labs and imaging studies  CBC: Recent Labs  Lab 01/04/25 1151 01/05/25 0610  WBC 9.2 5.6  NEUTROABS 7.2  --   HGB 13.8 11.6*  HCT 41.7 34.9*  MCV 105.6* 103.3*  PLT 481* 342    Basic Metabolic Panel: Recent Labs  Lab 01/04/25 1151 01/05/25 0610  NA 136 136   K 4.3 4.2  CL 100 102  CO2 24 27  GLUCOSE 143* 90  BUN 26* 25*  CREATININE 2.04* 1.86*  CALCIUM  9.7 9.1    GFR: Estimated Creatinine Clearance: 33.6 mL/min (A) (by C-G formula based on SCr of 1.86 mg/dL (H)).  Liver Function Tests: Recent Labs  Lab 01/04/25 1151  AST 24  ALT 23  ALKPHOS 74  BILITOT 0.7  PROT 7.6  ALBUMIN 3.6    CBG: No results for input(s): GLUCAP in the last 168 hours.   Recent Results (from the past 240 hours)  Resp panel by RT-PCR (RSV, Flu A&B, Covid) Anterior Nasal Swab     Status: None   Collection Time: 01/04/25  1:02 PM   Specimen: Anterior Nasal Swab  Result Value Ref Range Status   SARS Coronavirus 2 by RT PCR NEGATIVE NEGATIVE Final   Influenza A by PCR NEGATIVE NEGATIVE Final   Influenza B by PCR NEGATIVE NEGATIVE Final    Comment: (NOTE) The Xpert Xpress SARS-CoV-2/FLU/RSV plus assay is intended as an aid in the diagnosis of influenza from Nasopharyngeal swab specimens and should not be used as a sole basis for treatment. Nasal washings and aspirates are unacceptable for Xpert Xpress SARS-CoV-2/FLU/RSV testing.  Fact Sheet for Patients: bloggercourse.com  Fact Sheet for Healthcare Providers: seriousbroker.it  This test is not yet approved or cleared by the [place]  FDA and has been authorized for detection and/or diagnosis of SARS-CoV-2 by FDA under an Emergency Use Authorization (EUA). This EUA will remain in effect (meaning this test can be used) for the duration of the COVID-19 declaration under Section 564(b)(1) of the Act, 21 U.S.C. section 360bbb-3(b)(1), unless the authorization is terminated or revoked.     Resp Syncytial Virus by PCR NEGATIVE NEGATIVE Final    Comment: (NOTE) Fact Sheet for Patients: bloggercourse.com  Fact Sheet for Healthcare Providers: seriousbroker.it  This test is not yet approved or  cleared by the [place]  FDA and has been authorized for detection and/or diagnosis of SARS-CoV-2 by FDA under an Emergency Use Authorization (EUA). This EUA will remain in effect (meaning this test can be used) for the duration of the COVID-19 declaration under Section 564(b)(1) of the Act, 21 U.S.C. section 360bbb-3(b)(1), unless the authorization is terminated or revoked.  Performed at [facility], [address] [place], [place] [address]   Blood culture (routine x 2)     Status: None (Preliminary result)   Collection Time: 01/04/25  1:09 PM   Specimen: BLOOD  Result Value Ref Range Status   Specimen Description BLOOD RIGHT ANTECUBITAL  Final   Special Requests   Final    BOTTLES DRAWN AEROBIC AND ANAEROBIC Blood Culture adequate volume   Culture   Final    NO GROWTH < 24 HOURS Performed at [facility], [address], [place], [place] [address]    Report Status PENDING  Incomplete  Blood culture (routine x 2)     Status: None (Preliminary result)   Collection Time: 01/04/25  5:06 PM   Specimen: BLOOD  Result Value Ref Range Status   Specimen Description BLOOD LEFT ANTECUBITAL  Final   Special Requests   Final    BOTTLES DRAWN AEROBIC AND ANAEROBIC Blood Culture adequate volume   Culture   Final    NO GROWTH < 24 HOURS Performed at [facility], [address], [place], [place] [address]    Report Status PENDING  Incomplete         Radiology Studies: CT Head Wo Contrast Result Date: 01/04/2025 CLINICAL DATA:  Syncope. EXAM: CT HEAD WITHOUT CONTRAST TECHNIQUE: Contiguous axial images were obtained from the base of the skull through the vertex without intravenous contrast. RADIATION DOSE REDUCTION: This exam was performed according to the departmental dose-optimization program which includes automated exposure control, adjustment of the mA and/or kV according to patient size and/or use of iterative reconstruction technique. COMPARISON:  None  Available. FINDINGS: Brain: No evidence of intracranial hemorrhage, acute infarction, hydrocephalus, extra-axial collection, or mass lesion/mass effect. Moderate diffuse cerebral atrophy and mild chronic small vessel disease are noted. Vascular:  No hyperdense vessel or other acute findings. Skull: No evidence of fracture or other significant bone abnormality. Sinuses/Orbits:  No acute findings. Other: None. IMPRESSION: No acute intracranial abnormality. 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 Electronically Signed   By: Norleen DELENA Kil M.D.   On: 01/04/2025 13:54        Scheduled Meds:  amiodarone   200 mg Oral Daily   apixaban   2.5 mg Oral BID   guaiFENesin   600 mg Oral BID   levothyroxine   175 mcg Oral QAC breakfast   rosuvastatin   20 mg Oral Daily   Continuous Infusions:  cefTRIAXone  (ROCEPHIN )  IV 2 g (01/06/25 0810)     LOS: 2 days     Brayton Lye, MD [facility]   To contact the attending provider between 7A-7P or the covering provider during after hours 7P-7A, please log into the web site [email or web address] and access using universal [place] password for that web site. If you do not have the password, please call the hospital operator.  01/06/2025, 1:31 PM   "

## 2025-01-06 NOTE — Evaluation (Signed)
 Occupational Therapy Evaluation Patient Details Name: Thomas [name] MRN: [id number] DOB: 12-Jan-1941 Today's Date: 01/06/2025   History of Present Illness   Thomas [name] is a 84 y.o. male admitted  with DOE/fatigue and found to have RLL CAP. Past medical history significant of CAD s/p PCI x2, Afib on Eliquis , HTN, HLD, CKD3, BPH, hypothyroidism, GERD, and recent admission in 11/2024 for ADHF.     Clinical Impressions Pt currently at modified independent/independent for selfcare tasks sit to stand and mobility without any assistive device.  Able to ambulate over 150' without difficulty.  Oxygen sats at 93% at rest per telemetry monitoring and HR in the low 80s.  With mobility and transfer sats remained at 96-99% on portable monitor but stated at 83% on telemetry pulse ox with HR much different at 154 bpm compared to around 100 on telemetry heart monitor. Pt with no dyspnea noted so feel the readings of O2 down to 83% were likely not accurate.  Feel pt is close to his normal baseline.  No further OT needs at this time.        Functional Status Assessment   Patient has not had a recent decline in their functional status     Equipment Recommendations   None recommended by OT      Precautions/Restrictions   Precautions Precautions: None Recall of Precautions/Restrictions: Intact Restrictions Weight Bearing Restrictions Per Provider Order: No     Mobility Bed Mobility Overal bed mobility: Independent                  Transfers Overall transfer level: Independent                        Balance Overall balance assessment: Independent                                         ADL either performed or assessed with clinical judgement   ADL Overall ADL's : Modified independent                                       General ADL Comments: Pt independent with mobility and simulated selfcare sit to stand.  Oxygen sats  decreased from 93% at rest to 84% on telemetry however likely not accurrate secondary to HR much higher on pulse ox reading than telemetry monitoring.  When checked with portable pulse ox, HR around 82 BPM during ambulation and O2 sats at 96-99% on room air.     Vision Baseline Vision/History: 1 Wears glasses Ability to See in Adequate Light: 0 Adequate Patient Visual Report: No change from baseline Vision Assessment?: No apparent visual deficits     Perception Perception: Not tested       Praxis Praxis: Not tested       Pertinent Vitals/Pain Pain Assessment Pain Assessment: No/denies pain     Extremity/Trunk Assessment Upper Extremity Assessment Upper Extremity Assessment: Overall WFL for tasks assessed   Lower Extremity Assessment Lower Extremity Assessment: Overall WFL for tasks assessed   Cervical / Trunk Assessment Cervical / Trunk Assessment: Normal   Communication Communication Communication: No apparent difficulties   Cognition Arousal: Alert Behavior During Therapy: WFL for tasks assessed/performed Cognition: No apparent impairments  Following commands: Intact                  Home Living Family/patient expects to be discharged to:: Private residence Living Arrangements: Spouse/significant other Available Help at Discharge: Available 24 hours/day;Family Type of Home: House Home Access: Stairs to enter [employer] of Steps: 3 Entrance Stairs-Rails: Right;Left;Can reach both Home Layout: One level     Bathroom Shower/Tub: Producer, Television/film/video: Standard     Home Equipment: Agricultural Consultant (2 wheels);Cane - single point;Grab bars - tub/shower;Tub bench;BSC/3in1          Prior Functioning/Environment Prior Level of Function : Independent/Modified Independent;Driving             Mobility Comments: IND with all mobility, no use of DME, drives       AM-PAC OT 6 Clicks  Daily Activity     Outcome Measure Help from another person eating meals?: None Help from another person taking care of personal grooming?: None Help from another person toileting, which includes using toliet, bedpan, or urinal?: None Help from another person bathing (including washing, rinsing, drying)?: None Help from another person to put on and taking off regular upper body clothing?: None Help from another person to put on and taking off regular lower body clothing?: None 6 Click Score: 24   End of Session Nurse Communication: Mobility status  Activity Tolerance: Patient tolerated treatment well Patient left: in chair                   Time: 1137-1204 OT Time Calculation (min): 27 min Charges:  OT General Charges $OT Visit: 1 Visit OT Evaluation $OT Eval Low Complexity: 1 Low OT Treatments $Self Care/Home Management : 8-22 mins  Lynwood Constant, OTR/L Acute Rehabilitation Services  Office [phone] 01/06/2025

## 2025-01-06 NOTE — Progress Notes (Signed)
 PT Cancellation Note  Patient Details Name: Thomas [name] MRN: [id number] DOB: 05/09/1941   Cancelled Treatment:    Reason Eval/Treat Not Completed: PT screened, no needs identified, will sign off. Per OT, pt moving independently and safely in hallway. Thank you for referral.   Richerd Lipoma, PT  Acute Rehab Services Secure chat preferred Office [phone]    [name] 01/06/2025, 12:13 PM

## 2025-01-06 NOTE — Plan of Care (Signed)

## 2025-01-07 ENCOUNTER — Other Ambulatory Visit (HOSPITAL_COMMUNITY): Payer: Self-pay

## 2025-01-07 DIAGNOSIS — J189 Pneumonia, unspecified organism: Secondary | ICD-10-CM | POA: Diagnosis not present

## 2025-01-07 LAB — CBC
HCT: 36.9 % — ABNORMAL LOW (ref 39.0–52.0)
Hemoglobin: 12.3 g/dL — ABNORMAL LOW (ref 13.0–17.0)
MCH: 34.6 pg — ABNORMAL HIGH (ref 26.0–34.0)
MCHC: 33.3 g/dL (ref 30.0–36.0)
MCV: 103.7 fL — ABNORMAL HIGH (ref 80.0–100.0)
Platelets: 331 10*3/uL (ref 150–400)
RBC: 3.56 MIL/uL — ABNORMAL LOW (ref 4.22–5.81)
RDW: 13.3 % (ref 11.5–15.5)
WBC: 5.2 10*3/uL (ref 4.0–10.5)
nRBC: 0 % (ref 0.0–0.2)

## 2025-01-07 LAB — BASIC METABOLIC PANEL WITH GFR
Anion gap: 8 (ref 5–15)
BUN: 24 mg/dL — ABNORMAL HIGH (ref 8–23)
CO2: 30 mmol/L (ref 22–32)
Calcium: 9.3 mg/dL (ref 8.9–10.3)
Chloride: 101 mmol/L (ref 98–111)
Creatinine, Ser: 1.85 mg/dL — ABNORMAL HIGH (ref 0.61–1.24)
GFR, Estimated: 36 mL/min — ABNORMAL LOW
Glucose, Bld: 87 mg/dL (ref 70–99)
Potassium: 4.4 mmol/L (ref 3.5–5.1)
Sodium: 139 mmol/L (ref 135–145)

## 2025-01-07 MED ORDER — GUAIFENESIN ER 600 MG PO TB12
600.0000 mg | ORAL_TABLET | Freq: Two times a day (BID) | ORAL | 0 refills | Status: AC
  Filled 2025-01-07: qty 30, 15d supply, fill #0

## 2025-01-07 MED ORDER — FUROSEMIDE 40 MG PO TABS
40.0000 mg | ORAL_TABLET | ORAL | 0 refills | Status: AC
  Filled 2025-01-07: qty 30, 60d supply, fill #0

## 2025-01-07 MED ORDER — AMOXICILLIN-POT CLAVULANATE 500-125 MG PO TABS
1.0000 | ORAL_TABLET | Freq: Two times a day (BID) | ORAL | 0 refills | Status: AC
  Filled 2025-01-07: qty 6, 3d supply, fill #0

## 2025-01-07 NOTE — Care Management Important Message (Signed)
 Important Message  Patient Details  Name: Thomas [name] MRN: [id number] Date of Birth: 1941/10/05   Important Message Given:  Yes - Medicare IM   Patient left prior to IM delivery will mail a copy to the patient home address.   Thomas [name] 01/07/2025, 1:51 PM

## 2025-01-07 NOTE — Plan of Care (Signed)

## 2025-01-07 NOTE — Discharge Summary (Signed)
 Physician Discharge Summary  Thomas [name] FMW:[id number] DOB: 1941-11-24 DOA: 01/04/2025  PCP: Vincente Shivers, NP  Admit date: 01/04/2025 Discharge date: 01/07/2025  Admitted From: (Home) Disposition:  (Home /)  Recommendations for Outpatient Follow-up:  Follow up with PCP in 1-2 weeks Please obtain BMP/CBC in one week Please follow up next cardiology appointment   Diet recommendation: Heart Healthy 1.5 fluid restrictions   Brief/Interim Summary:  Thomas [name] is a 84 y.o. male with medical history significant of CAD s/p PCI x2, Afib on Eliquis , HTN, HLD, CKD3, BPH, hypothyroidism, GERD, and recent admission in 11/2024 for ADHF who p/w DOE/fatigue and found to have RLL CAP.   The patient has been feeling unwell for the past 3 weeks, including dry coughing spells, particularly at night, which interfered with sleep. Earlier today, the patient became dizzy and weak while moving from the bathroom to the bedroom. The patient sat on the bed and fell backward due to weakness. There was no loss of consciousness. The patient called his daughter to report the incident and she advised him to present to the ED for further evaluation.   In the ED, pt AFVSS. Labs notable for Cr 2.04 (baseline 1.4-1.7). CXR showed RLL consolidation c/f atypical infection. EDP started CAP abx and requested medicine admission.   RLL CAP - Evident on imaging, with IV antibiotics, Rocephin  and Romycin, much improved he will be discharged on Augmentin , encouraged to keep using his incentive spirometer and flutter valve . - Blood cultures remain negative   Paroxysmal A-fib - Continue with home medication amiodarone   -Continue with Eliquis   - With some mild bradycardia and short pauses initially, but no recurrence over last 2 days,   CKD stage III  - This is due to volume depletion, dehydration and soft blood pressure .  Appears clinically dehydrated on presentation, so his Lasix  was held as well his Entresto     Hypothyroidism  - Continue with Synthroid  .   Chronic combined systolic/diastolic CHF Hypertension>> hypotension - Appears to be volume depleted/dry.  Overall his blood pressure remains soft during hospital stay despite being off Lasix  and Entresto , and actually he did receive some IV fluids, as discussed with cardiology on-call prior to discharge, given soft blood pressure will continue to hold Entresto , and given his CKD will hold on ACEI/ARB, change his Lasix  to 40 mg p.o. every other day, I have discussed in detail with the patient and his daughter to keep log of blood pressure, daily weight, to bring with him next cardiology follow-up which has been scheduled prior to discharge. - Last Echo: 09/04/2024, EF 60-65%, normal LV function, mild concentric LVH   Atherosclerosis of native coronary artery of native heart without angina pectoris - History of coronary artery disease, with 2 stents -Continue Eliquis , continue statins,    Hyperlipidemia Continue statins   Hypomagnesemia - Repleted   BPH (benign prostatic hyperplasia) - With chronic Foley catheter, patient reports it was exchanged recently     Discharge Diagnoses:  Principal Problem:   CAP (community acquired pneumonia)    Discharge Instructions  Discharge Instructions     Discharge instructions   Complete by: As directed    Follow with Primary MD Vincente Shivers, NP   Get CBC, CMP, 2 view Chest X ray checked  by Primary MD next visit.    Disposition Home    Diet: Heart Healthy  , with feeding assistance and aspiration precautions.  Check your Weight same time everyday, if you gain over 2 pounds,  or you develop in leg swelling, experience more shortness of breath or chest pain, call your Primary MD immediately. Follow Cardiac Low Salt Diet and 1.5 lit/day fluid restriction.   Increase activity slowly   Complete by: As directed       Allergies as of 01/07/2025       Reactions   Benzonatate  Other (See  Comments)   Patient stopped taking this medication because it made him extremely light headed & dizzy   Hydrocodone  Nausea And Vomiting, Other (See Comments)   Dizziness and upset stomach   Oxycodone  Nausea And Vomiting, Nausea Only   Robaxin  [methocarbamol ] Nausea And Vomiting   Ultram  [tramadol ] Nausea And Vomiting        Medication List     STOP taking these medications    Entresto  24-26 MG Generic drug: sacubitril -valsartan        TAKE these medications    amiodarone  200 MG tablet Commonly known as: PACERONE  TAKE 1 TABLET BY MOUTH ONCE DAILY MONDAY THROUGH FRIDAY   amoxicillin -clavulanate 500-125 MG tablet Commonly known as: Augmentin  Take 1 tablet by mouth 2 (two) times daily for 3 days.   apixaban  2.5 MG Tabs tablet Commonly known as: ELIQUIS  Take 1 tablet (2.5 mg total) by mouth 2 (two) times daily.   furosemide  40 MG tablet Commonly known as: Lasix  Take 1 tablet (40 mg total) by mouth every other day. Start taking on: January 08, 2025 What changed:  medication strength how much to take when to take this   guaiFENesin  600 MG 12 hr tablet Commonly known as: MUCINEX  Take 1 tablet (600 mg total) by mouth 2 (two) times daily for 15 days.   levothyroxine  175 MCG tablet Commonly known as: SYNTHROID  Take 175 mcg by mouth daily before breakfast.   Myrbetriq 50 MG Tb24 tablet Generic drug: mirabegron ER Take 50 mg by mouth as needed (when he has to pee and it burns).   potassium chloride  SA 20 MEQ tablet Commonly known as: KLOR-CON  M Take 1 tablet (20 mEq total) by mouth daily.   rosuvastatin  20 MG tablet Commonly known as: CRESTOR  Take 1 tablet (20 mg total) by mouth daily.        Allergies[1]  Consultations: None  Procedures/Studies: CT Head Wo Contrast Result Date: 01/04/2025 CLINICAL DATA:  Syncope. EXAM: CT HEAD WITHOUT CONTRAST TECHNIQUE: Contiguous axial images were obtained from the base of the skull through the vertex without  intravenous contrast. RADIATION DOSE REDUCTION: This exam was performed according to the departmental dose-optimization program which includes automated exposure control, adjustment of the mA and/or kV according to patient size and/or use of iterative reconstruction technique. COMPARISON:  None Available. FINDINGS: Brain: No evidence of intracranial hemorrhage, acute infarction, hydrocephalus, extra-axial collection, or mass lesion/mass effect. Moderate diffuse cerebral atrophy and mild chronic small vessel disease are noted. Vascular:  No hyperdense vessel or other acute findings. Skull: No evidence of fracture or other significant bone abnormality. Sinuses/Orbits:  No acute findings. Other: None. IMPRESSION: No acute intracranial abnormality. 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 Electronically Signed   By: Norleen DELENA Kil M.D.   On: 01/04/2025 13:54   DG Chest Portable 1 View Result Date: 01/04/2025 EXAM: 1 VIEW(S) XRAY OF THE CHEST 01/04/2025 12:08:00 PM COMPARISON: 11/23/2024 CLINICAL HISTORY: Cough and chest tightness. FINDINGS: LUNGS AND PLEURA: Low lung volumes. Bibasilar interstitial coarsening. Irregular nodule or opacity in the right lower lung. No pleural effusion. No pneumothorax. HEART AND MEDIASTINUM: Tortuous aorta with atherosclerosis. No acute abnormality  of the cardiac silhouette. BONES AND SOFT TISSUES: Multilevel degenerative disc disease of spine. IMPRESSION: 1. Findings favor atypical infection. Nodular opacity in the right lower lung is likely infectious. Follow up in 8 - 12 weeks is recommended to ensure resolution. Electronically signed by: Norman Gatlin MD 01/04/2025 12:40 PM EST RP Workstation: [id number]      Subjective: No dyspnea, fever, cough or chest pain  Discharge Exam: Vitals:   01/07/25 0333 01/07/25 0752  BP: 108/84 (!) 108/54  Pulse:  77  Resp:  18  Temp: (!) 97.5 F (36.4 C) 97.6 F (36.4 C)  SpO2:  91%   Vitals:   01/06/25 2049  01/07/25 0032 01/07/25 0333 01/07/25 0752  BP: 118/79 (!) 100/50 108/84 (!) 108/54  Pulse: 63   77  Resp: 16   18  Temp: 97.8 F (36.6 C) (!) 97.5 F (36.4 C) (!) 97.5 F (36.4 C) 97.6 F (36.4 C)  TempSrc: Oral Axillary Oral Oral  SpO2:    91%  Weight:      Height:        General: Pt is alert, awake, not in acute distress Cardiovascular: RRR, S1/S2 +, no rubs, no gallops Respiratory: CTA bilaterally, no wheezing, no rhonchi Abdominal: Soft, NT, ND, bowel sounds + Extremities: no edema, no cyanosis    The results of significant diagnostics from this hospitalization (including imaging, microbiology, ancillary and laboratory) are listed below for reference.     Microbiology: Recent Results (from the past 240 hours)  Resp panel by RT-PCR (RSV, Flu A&B, Covid) Anterior Nasal Swab     Status: None   Collection Time: 01/04/25  1:02 PM   Specimen: Anterior Nasal Swab  Result Value Ref Range Status   SARS Coronavirus 2 by RT PCR NEGATIVE NEGATIVE Final   Influenza A by PCR NEGATIVE NEGATIVE Final   Influenza B by PCR NEGATIVE NEGATIVE Final    Comment: (NOTE) The Xpert Xpress SARS-CoV-2/FLU/RSV plus assay is intended as an aid in the diagnosis of influenza from Nasopharyngeal swab specimens and should not be used as a sole basis for treatment. Nasal washings and aspirates are unacceptable for Xpert Xpress SARS-CoV-2/FLU/RSV testing.  Fact Sheet for Patients: bloggercourse.com  Fact Sheet for Healthcare Providers: seriousbroker.it  This test is not yet approved or cleared by the [place]  FDA and has been authorized for detection and/or diagnosis of SARS-CoV-2 by FDA under an Emergency Use Authorization (EUA). This EUA will remain in effect (meaning this test can be used) for the duration of the COVID-19 declaration under Section 564(b)(1) of the Act, 21 U.S.C. section 360bbb-3(b)(1), unless the authorization is  terminated or revoked.     Resp Syncytial Virus by PCR NEGATIVE NEGATIVE Final    Comment: (NOTE) Fact Sheet for Patients: bloggercourse.com  Fact Sheet for Healthcare Providers: seriousbroker.it  This test is not yet approved or cleared by the [place]  FDA and has been authorized for detection and/or diagnosis of SARS-CoV-2 by FDA under an Emergency Use Authorization (EUA). This EUA will remain in effect (meaning this test can be used) for the duration of the COVID-19 declaration under Section 564(b)(1) of the Act, 21 U.S.C. section 360bbb-3(b)(1), unless the authorization is terminated or revoked.  Performed at [facility], [address] [place], [place] [address]   Blood culture (routine x 2)     Status: None (Preliminary result)   Collection Time: 01/04/25  1:09 PM   Specimen: BLOOD  Result Value Ref Range Status   Specimen  Description BLOOD RIGHT ANTECUBITAL  Final   Special Requests   Final    BOTTLES DRAWN AEROBIC AND ANAEROBIC Blood Culture adequate volume   Culture   Final    NO GROWTH 3 DAYS Performed at [facility], [address], [place], [place] [address]    Report Status PENDING  Incomplete  Blood culture (routine x 2)     Status: None (Preliminary result)   Collection Time: 01/04/25  5:06 PM   Specimen: BLOOD  Result Value Ref Range Status   Specimen Description BLOOD LEFT ANTECUBITAL  Final   Special Requests   Final    BOTTLES DRAWN AEROBIC AND ANAEROBIC Blood Culture adequate volume   Culture   Final    NO GROWTH 3 DAYS Performed at [facility], [address], [place], [place] [address]    Report Status PENDING  Incomplete     Labs: BNP (last 3 results) Recent Labs    05/04/24 0659 11/23/24 1012 11/24/24 0542  BNP 1,368.4* 526.5* 896.6*   Basic Metabolic Panel: Recent Labs  Lab 01/04/25 1151 01/05/25 0610 01/07/25 0431  NA 136 136 139  K 4.3 4.2 4.4  CL  100 102 101  CO2 24 27 30   GLUCOSE 143* 90 87  BUN 26* 25* 24*  CREATININE 2.04* 1.86* 1.85*  CALCIUM  9.7 9.1 9.3   Liver Function Tests: Recent Labs  Lab 01/04/25 1151  AST 24  ALT 23  ALKPHOS 74  BILITOT 0.7  PROT 7.6  ALBUMIN 3.6   Recent Labs  Lab 01/04/25 1151  LIPASE 58*   No results for input(s): AMMONIA in the last 168 hours. CBC: Recent Labs  Lab 01/04/25 1151 01/05/25 0610 01/07/25 0431  WBC 9.2 5.6 5.2  NEUTROABS 7.2  --   --   HGB 13.8 11.6* 12.3*  HCT 41.7 34.9* 36.9*  MCV 105.6* 103.3* 103.7*  PLT 481* 342 331   Cardiac Enzymes: No results for input(s): CKTOTAL, CKMB, CKMBINDEX, TROPONINI in the last 168 hours. BNP: Invalid input(s): POCBNP CBG: No results for input(s): GLUCAP in the last 168 hours. D-Dimer No results for input(s): DDIMER in the last 72 hours. Hgb A1c No results for input(s): HGBA1C in the last 72 hours. Lipid Profile No results for input(s): CHOL, HDL, LDLCALC, TRIG, CHOLHDL, LDLDIRECT in the last 72 hours. Thyroid  function studies No results for input(s): TSH, T4TOTAL, T3FREE, THYROIDAB in the last 72 hours.  Invalid input(s): FREET3 Anemia work up No results for input(s): VITAMINB12, FOLATE, FERRITIN, TIBC, IRON, RETICCTPCT in the last 72 hours. Urinalysis    Component Value Date/Time   COLORURINE YELLOW 11/23/2024 1021   APPEARANCEUR CLOUDY (A) 11/23/2024 1021   LABSPEC 1.023 11/23/2024 1021   PHURINE 7.0 11/23/2024 1021   GLUCOSEU NEGATIVE 11/23/2024 1021   HGBUR SMALL (A) 11/23/2024 1021   BILIRUBINUR NEGATIVE 11/23/2024 1021   KETONESUR 5 (A) 11/23/2024 1021   PROTEINUR 100 (A) 11/23/2024 1021   UROBILINOGEN 0.2 04/27/2015 1033   NITRITE POSITIVE (A) 11/23/2024 1021   LEUKOCYTESUR SMALL (A) 11/23/2024 1021   Sepsis Labs Recent Labs  Lab 01/04/25 1151 01/05/25 0610 01/07/25 0431  WBC 9.2 5.6 5.2   Microbiology Recent Results (from the past 240 hours)   Resp panel by RT-PCR (RSV, Flu A&B, Covid) Anterior Nasal Swab     Status: None   Collection Time: 01/04/25  1:02 PM   Specimen: Anterior Nasal Swab  Result Value Ref Range Status   SARS Coronavirus 2 by  RT PCR NEGATIVE NEGATIVE Final   Influenza A by PCR NEGATIVE NEGATIVE Final   Influenza B by PCR NEGATIVE NEGATIVE Final    Comment: (NOTE) The Xpert Xpress SARS-CoV-2/FLU/RSV plus assay is intended as an aid in the diagnosis of influenza from Nasopharyngeal swab specimens and should not be used as a sole basis for treatment. Nasal washings and aspirates are unacceptable for Xpert Xpress SARS-CoV-2/FLU/RSV testing.  Fact Sheet for Patients: bloggercourse.com  Fact Sheet for Healthcare Providers: seriousbroker.it  This test is not yet approved or cleared by the [place]  FDA and has been authorized for detection and/or diagnosis of SARS-CoV-2 by FDA under an Emergency Use Authorization (EUA). This EUA will remain in effect (meaning this test can be used) for the duration of the COVID-19 declaration under Section 564(b)(1) of the Act, 21 U.S.C. section 360bbb-3(b)(1), unless the authorization is terminated or revoked.     Resp Syncytial Virus by PCR NEGATIVE NEGATIVE Final    Comment: (NOTE) Fact Sheet for Patients: bloggercourse.com  Fact Sheet for Healthcare Providers: seriousbroker.it  This test is not yet approved or cleared by the [place]  FDA and has been authorized for detection and/or diagnosis of SARS-CoV-2 by FDA under an Emergency Use Authorization (EUA). This EUA will remain in effect (meaning this test can be used) for the duration of the COVID-19 declaration under Section 564(b)(1) of the Act, 21 U.S.C. section 360bbb-3(b)(1), unless the authorization is terminated or revoked.  Performed at [facility], [address], [place],  [place] [address]   Blood culture (routine x 2)     Status: None (Preliminary result)   Collection Time: 01/04/25  1:09 PM   Specimen: BLOOD  Result Value Ref Range Status   Specimen Description BLOOD RIGHT ANTECUBITAL  Final   Special Requests   Final    BOTTLES DRAWN AEROBIC AND ANAEROBIC Blood Culture adequate volume   Culture   Final    NO GROWTH 3 DAYS Performed at [facility], [address], [place], [place] [address]    Report Status PENDING  Incomplete  Blood culture (routine x 2)     Status: None (Preliminary result)   Collection Time: 01/04/25  5:06 PM   Specimen: BLOOD  Result Value Ref Range Status   Specimen Description BLOOD LEFT ANTECUBITAL  Final   Special Requests   Final    BOTTLES DRAWN AEROBIC AND ANAEROBIC Blood Culture adequate volume   Culture   Final    NO GROWTH 3 DAYS Performed at [facility], [address] [place], [place] [address]    Report Status PENDING  Incomplete     Time coordinating discharge: Over 30 minutes  SIGNED:   Brayton Lye, MD  [facility] 01/07/2025, 3:15 PM Pager   If 7PM-7AM, please contact night-coverage [email or web address] Password [email or web address]    [1]  Allergies Allergen Reactions   Benzonatate  Other (See Comments)    Patient stopped taking this medication because it made him extremely light headed & dizzy   Hydrocodone  Nausea And Vomiting and Other (See Comments)    Dizziness and upset stomach   Oxycodone  Nausea And Vomiting and Nausea Only   Robaxin  [Methocarbamol ] Nausea And Vomiting   Ultram  [Tramadol ] Nausea And Vomiting

## 2025-01-07 NOTE — TOC Transition Note (Signed)
 Transition of Care [facility]) - Discharge Note   Patient Details  Name: Thomas [name] MRN: [id number] Date of Birth: 1941/09/12  [facility] CM/SW Contact:  Landry DELENA Senters, RN Phone Number: 01/07/2025, 10:51 AM   Clinical Narrative:     Patient will be discharging to home today, with transportation by family.   No needs identified by therapy.   No further needs identified by CM.  Final next level of care: Home/Self Care Barriers to Discharge: No Barriers Identified   Patient Goals and CMS Choice            Discharge Placement                       Discharge Plan and Services Additional resources added to the After Visit Summary for                                       Social Drivers of Health (SDOH) Interventions SDOH Screenings   Food Insecurity: No Food Insecurity (01/04/2025)  Housing: Low Risk (01/04/2025)  Transportation Needs: Unknown (01/04/2025)  Utilities: Not At Risk (01/04/2025)  Depression (PHQ2-9): Low Risk (12/17/2024)  Social Connections: Moderately Integrated (01/04/2025)  Tobacco Use: Medium Risk (12/17/2024)     Readmission Risk Interventions     No data to display

## 2025-01-07 NOTE — Discharge Instructions (Signed)
 Follow with Primary MD Vincente Shivers, NP   Get CBC, CMP, 2 view Chest X ray checked  by Primary MD next visit.    Disposition Home    Diet: Heart Healthy  , with feeding assistance and aspiration precautions.  Check your Weight same time everyday, if you gain over 2 pounds, or you develop in leg swelling, experience more shortness of breath or chest pain, call your Primary MD immediately. Follow Cardiac Low Salt Diet and 1.5 lit/day fluid restriction.

## 2025-01-08 ENCOUNTER — Telehealth: Payer: Self-pay

## 2025-01-08 NOTE — Transitions of Care (Post Inpatient/ED Visit) (Signed)
 "  01/08/2025  Name: Thomas [name] MRN: [id number] DOB: May 26, 1941  Today's TOC FU Call Status: Today's TOC FU Call Status:: Successful TOC FU Call Completed TOC FU Call Complete Date: 01/08/25  Patient's Name and Date of Birth confirmed. Name, DOB  Transition Care Management Follow-up Telephone Call Date of Discharge: 01/07/25 Discharge Facility: [facility]) Type of Discharge: Inpatient Admission Primary Inpatient Discharge Diagnosis:: Community acquired pneumonia How have you been since you were released from the hospital?: Better Any questions or concerns?: No  Items Reviewed: Did you receive and understand the discharge instructions provided?: Yes Medications obtained,verified, and reconciled?: Yes (Medications Reviewed) Any new allergies since your discharge?: No Dietary orders reviewed?: Yes Type of Diet Ordered:: regular diet Do you have support at home?: Yes People in Home [RPT]: spouse Name of Support/Comfort Primary Source: Wanda Dicarlo  Medications Reviewed Today: Medications Reviewed Today     Reviewed by Iyanni Hepp E, RN (Registered Nurse) on 01/08/25 at 1512  Med List Status: <None>   Medication Order Taking? Sig Documenting Provider Last Dose Status Informant  amiodarone  (PACERONE ) 200 MG tablet [id number] Yes TAKE 1 TABLET BY MOUTH ONCE DAILY MONDAY THROUGH FRIDAY Waddell Danelle ORN, MD  Active Self, Pharmacy Records  amoxicillin -clavulanate (AUGMENTIN ) 500-125 MG tablet [id number] Yes Take 1 tablet by mouth 2 (two) times daily for 3 days. Elgergawy, Brayton RAMAN, MD  Active   apixaban  (ELIQUIS ) 2.5 MG TABS tablet [id number] Yes Take 1 tablet (2.5 mg total) by mouth 2 (two) times daily. Aniceto Daphne CROME, NP  Active Self, Pharmacy Records  furosemide  (LASIX ) 40 MG tablet [id number] Yes Take 1 tablet (40 mg total) by mouth every other day. Elgergawy, Brayton RAMAN, MD  Active   guaiFENesin  (MUCINEX ) 600 MG 12 hr tablet [id number] Yes Take 1 tablet (600 mg total) by mouth 2  (two) times daily for 15 days. Elgergawy, Brayton RAMAN, MD  Active   levothyroxine  (SYNTHROID ) 175 MCG tablet [id number] Yes Take 175 mcg by mouth daily before breakfast. [provider]  Active Self, Pharmacy Records  MYRBETRIQ 50 MG TB24 tablet [id number] Yes Take 50 mg by mouth as needed (when he has to pee and it burns). [provider]  Active Self, Pharmacy Records  potassium chloride  SA (KLOR-CON  M) 20 MEQ tablet [id number] Yes Take 1 tablet (20 mEq total) by mouth daily. Elmira Newman PARAS, MD  Active Self, Pharmacy Records  rosuvastatin  (CRESTOR ) 20 MG tablet [id number]  Take 1 tablet (20 mg total) by mouth daily.  Patient not taking: Reported on 01/08/2025   Elmira Newman PARAS, MD  Active Self, Pharmacy Records            Home Care and Equipment/Supplies: Were Home Health Services Ordered?: No Any new equipment or medical supplies ordered?: Yes (Incentive spirometer, Flutter valve) Name of Medical supply agency?: patient unsure Were you able to get the equipment/medical supplies?: Yes Do you have any questions related to the use of the equipment/supplies?: No  Functional Questionnaire: Do you need assistance with bathing/showering or dressing?: No Do you need assistance with meal preparation?: No Do you need assistance with eating?: No Do you have difficulty maintaining continence: No Do you need assistance with getting out of bed/getting out of a chair/moving?: No Do you have difficulty managing or taking your medications?: No  Follow up appointments reviewed: PCP Follow-up appointment confirmed?: Yes (offered to schedule patient hospital follow up visit due to appointment on 01/14/25 being a physical. Patient declined. Advised patient to call primary  provider office and have 01/14/25 visit changed to a hospital follow up visit.) Date of PCP follow-up appointment?: 01/14/25 Follow-up Provider: Carrol Aurora, NP Specialist Hospital Follow-up appointment confirmed?:  Yes Date of Specialist follow-up appointment?: 01/30/25 Follow-Up Specialty Provider:: Lum Louis, NP Do you need transportation to your follow-up appointment?: No Do you understand care options if your condition(s) worsen?: Yes-patient verbalized understanding  SDOH Interventions Today    Flowsheet Row Most Recent Value  SDOH Interventions   Food Insecurity Interventions Intervention Not Indicated  Housing Interventions Intervention Not Indicated  Transportation Interventions Intervention Not Indicated  Utilities Interventions Intervention Not Indicated   Discussed and offered 30 day TOC program.  Patient declined services.  The patient has been provided with contact information for the care management team and has been advised to call with any health -related questions or concerns.  The patient verbalized understanding with current plan of care.  The patient is directed to their insurance card regarding availability of benefits coverage.    Arvin Seip RN, BSN, CCM [employer], Population Health Case Manager Phone: [phone]  "

## 2025-01-08 NOTE — Patient Instructions (Signed)
 Visit Information  Thank you for taking time to visit with me today. Please don't hesitate to contact me if I can be of assistance to you.  Call your provider for any new and/ or ongoing symptoms.   Patient instructions: notify provider for lightheadedness / dizziness.  notify  provider for blood pressures ranging 90/60 and below.  Monitor  blood pressure daily and if symptoms are noted.  Take medications as prescribed Notify provider for any new and/ or ongoing symptoms/ concerns Call and inform your primary care provider office that you need to have a hospital follow up visit Seek medical emergency services for severe symptoms with your breathing and/ or chest pain.  Use your incentive spirometer / flutter valve at least every 2-4 hours   Patient verbalizes understanding of instructions and care plan provided today and agrees to view in MyChart. Active MyChart status and patient understanding of how to access instructions and care plan via MyChart confirmed with patient.     The patient has been provided with contact information for the care management team and has been advised to call with any health related questions or concerns.   Please call the care guide team at [phone] if you need to cancel or reschedule your appointment.   Please call the Suicide and Crisis Lifeline: 988 call the [place]  National Suicide Prevention Lifeline: 740-668-4962 or TTY: (240) 455-7314 TTY (231)887-9802) to talk to a trained counselor call 1-800-273-TALK (toll free, 24 hour hotline) if you are experiencing a Mental Health or Behavioral Health Crisis or need someone to talk to.  Arvin Seip RN, BSN, CCM [employer], Population Health Case Manager Phone: [phone]

## 2025-01-09 ENCOUNTER — Telehealth: Payer: Self-pay

## 2025-01-09 LAB — CULTURE, BLOOD (ROUTINE X 2)
Culture: NO GROWTH
Culture: NO GROWTH
Special Requests: ADEQUATE
Special Requests: ADEQUATE

## 2025-01-09 NOTE — Telephone Encounter (Signed)
 Spoke with patients daughter. She was concerned with several changes made at the hospital and had questions pertaining to why they did certain things. I advised her that we can discuss farther at this visit but changes made by the hospital was to help him not farther develop pneumonia more then he had. Carlyon did state that patient needs follow up blood work and chest x ray done at his appt on 01/14/25. I advised this will be able to be done at his visit.

## 2025-01-09 NOTE — Telephone Encounter (Signed)
 Copied from CRM #[id number]. Topic: Clinical - Medical Advice >> Jan 09, 2025  9:03 AM Carlyon D wrote: Reason for CRM: Pt daughter sari is calling in regards to some medical questions and advice she needs for dad in regards to blood pressure meds and 5 other things she needs answers too. Please call miss wendy back [phone]

## 2025-01-14 ENCOUNTER — Ambulatory Visit
Admission: RE | Admit: 2025-01-14 | Discharge: 2025-01-14 | Disposition: A | Source: Ambulatory Visit | Attending: Nurse Practitioner

## 2025-01-14 ENCOUNTER — Ambulatory Visit: Admitting: Nurse Practitioner

## 2025-01-14 VITALS — BP 120/80 | HR 60 | Temp 97.6°F | Ht 72.0 in | Wt 180.8 lb

## 2025-01-14 DIAGNOSIS — Z09 Encounter for follow-up examination after completed treatment for conditions other than malignant neoplasm: Secondary | ICD-10-CM

## 2025-01-14 DIAGNOSIS — N1832 Chronic kidney disease, stage 3b: Secondary | ICD-10-CM | POA: Diagnosis not present

## 2025-01-14 DIAGNOSIS — Z8701 Personal history of pneumonia (recurrent): Secondary | ICD-10-CM | POA: Diagnosis not present

## 2025-01-14 DIAGNOSIS — I509 Heart failure, unspecified: Secondary | ICD-10-CM

## 2025-01-14 DIAGNOSIS — N179 Acute kidney failure, unspecified: Secondary | ICD-10-CM

## 2025-01-15 LAB — COMPREHENSIVE METABOLIC PANEL WITH GFR
ALT: 19 U/L (ref 3–53)
AST: 21 U/L (ref 5–37)
Albumin: 3.6 g/dL (ref 3.5–5.2)
Alkaline Phosphatase: 59 U/L (ref 39–117)
BUN: 22 mg/dL (ref 6–23)
CO2: 31 meq/L (ref 19–32)
Calcium: 9 mg/dL (ref 8.4–10.5)
Chloride: 102 meq/L (ref 96–112)
Creatinine, Ser: 1.87 mg/dL — ABNORMAL HIGH (ref 0.40–1.50)
GFR: 33.05 mL/min — ABNORMAL LOW
Glucose, Bld: 103 mg/dL — ABNORMAL HIGH (ref 70–99)
Potassium: 4 meq/L (ref 3.5–5.1)
Sodium: 140 meq/L (ref 135–145)
Total Bilirubin: 0.6 mg/dL (ref 0.2–1.2)
Total Protein: 7.2 g/dL (ref 6.0–8.3)

## 2025-01-15 LAB — CBC
HCT: 39.7 % (ref 39.0–52.0)
Hemoglobin: 13.4 g/dL (ref 13.0–17.0)
MCHC: 33.7 g/dL (ref 30.0–36.0)
MCV: 101.8 fl — ABNORMAL HIGH (ref 78.0–100.0)
Platelets: 336 10*3/uL (ref 150.0–400.0)
RBC: 3.9 Mil/uL — ABNORMAL LOW (ref 4.22–5.81)
RDW: 14.5 % (ref 11.5–15.5)
WBC: 6.4 10*3/uL (ref 4.0–10.5)

## 2025-01-16 ENCOUNTER — Ambulatory Visit: Payer: Self-pay | Admitting: Nurse Practitioner

## 2025-01-30 ENCOUNTER — Ambulatory Visit: Admitting: Emergency Medicine

## 2025-02-11 ENCOUNTER — Ambulatory Visit: Admitting: General Practice

## 2025-03-05 ENCOUNTER — Ambulatory Visit: Admitting: Emergency Medicine
